# Patient Record
Sex: Male | Born: 1950 | Race: Black or African American | Hispanic: No | Marital: Single | Smoking: Light tobacco smoker
Health system: Southern US, Community
[De-identification: ages and names within clinical notes are randomized; demographics above are authoritative.]

## PROBLEM LIST (undated history)

## (undated) DIAGNOSIS — I10 Essential (primary) hypertension: Secondary | ICD-10-CM

## (undated) DIAGNOSIS — E785 Hyperlipidemia, unspecified: Secondary | ICD-10-CM

## (undated) DIAGNOSIS — K7460 Unspecified cirrhosis of liver: Secondary | ICD-10-CM

## (undated) DIAGNOSIS — N189 Chronic kidney disease, unspecified: Secondary | ICD-10-CM

## (undated) DIAGNOSIS — E781 Pure hyperglyceridemia: Secondary | ICD-10-CM

## (undated) DIAGNOSIS — K5730 Diverticulosis of large intestine without perforation or abscess without bleeding: Secondary | ICD-10-CM

## (undated) DIAGNOSIS — G43909 Migraine, unspecified, not intractable, without status migrainosus: Secondary | ICD-10-CM

## (undated) DIAGNOSIS — F319 Bipolar disorder, unspecified: Secondary | ICD-10-CM

## (undated) DIAGNOSIS — R911 Solitary pulmonary nodule: Secondary | ICD-10-CM

## (undated) DIAGNOSIS — K859 Acute pancreatitis without necrosis or infection, unspecified: Secondary | ICD-10-CM

## (undated) DIAGNOSIS — N529 Male erectile dysfunction, unspecified: Secondary | ICD-10-CM

## (undated) DIAGNOSIS — M6740 Ganglion, unspecified site: Secondary | ICD-10-CM

## (undated) DIAGNOSIS — K2270 Barrett's esophagus without dysplasia: Secondary | ICD-10-CM

## (undated) HISTORY — DX: Solitary pulmonary nodule: R91.1

## (undated) HISTORY — DX: Migraine, unspecified, not intractable, without status migrainosus: G43.909

## (undated) HISTORY — DX: Chronic kidney disease, unspecified: N18.9

## (undated) HISTORY — DX: Diverticulosis of large intestine without perforation or abscess without bleeding: K57.30

## (undated) HISTORY — DX: Acute pancreatitis without necrosis or infection, unspecified: K85.90

## (undated) HISTORY — DX: Hyperlipidemia, unspecified: E78.5

## (undated) HISTORY — DX: Ganglion, unspecified site: M67.40

## (undated) HISTORY — DX: Unspecified cirrhosis of liver: K74.60

## (undated) HISTORY — DX: Bipolar disorder, unspecified: F31.9

## (undated) HISTORY — DX: Pure hyperglyceridemia: E78.1

## (undated) HISTORY — DX: Male erectile dysfunction, unspecified: N52.9

## (undated) HISTORY — DX: Essential (primary) hypertension: I10

## (undated) HISTORY — DX: Barrett's esophagus without dysplasia: K22.70

---
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
## 1999-10-29 ENCOUNTER — Encounter: Admission: RE | Admit: 1999-10-29 | Discharge: 1999-10-29 | Payer: Self-pay | Admitting: Internal Medicine

## 1999-10-29 ENCOUNTER — Ambulatory Visit (HOSPITAL_COMMUNITY): Admission: RE | Admit: 1999-10-29 | Discharge: 1999-10-29 | Payer: Self-pay | Admitting: Internal Medicine

## 1999-11-10 ENCOUNTER — Emergency Department (HOSPITAL_COMMUNITY): Admission: EM | Admit: 1999-11-10 | Discharge: 1999-11-10 | Payer: Self-pay | Admitting: Emergency Medicine

## 1999-11-19 ENCOUNTER — Ambulatory Visit (HOSPITAL_COMMUNITY): Admission: RE | Admit: 1999-11-19 | Discharge: 1999-11-19 | Payer: Self-pay | Admitting: *Deleted

## 1999-11-19 ENCOUNTER — Encounter: Payer: Self-pay | Admitting: *Deleted

## 1999-12-03 ENCOUNTER — Encounter: Payer: Self-pay | Admitting: Emergency Medicine

## 1999-12-03 ENCOUNTER — Emergency Department (HOSPITAL_COMMUNITY): Admission: EM | Admit: 1999-12-03 | Discharge: 1999-12-03 | Payer: Self-pay | Admitting: Emergency Medicine

## 1999-12-05 ENCOUNTER — Emergency Department (HOSPITAL_COMMUNITY): Admission: EM | Admit: 1999-12-05 | Discharge: 1999-12-05 | Payer: Self-pay | Admitting: *Deleted

## 2000-01-18 ENCOUNTER — Inpatient Hospital Stay (HOSPITAL_COMMUNITY): Admission: EM | Admit: 2000-01-18 | Discharge: 2000-01-19 | Payer: Self-pay

## 2000-03-19 ENCOUNTER — Ambulatory Visit (HOSPITAL_COMMUNITY): Admission: RE | Admit: 2000-03-19 | Discharge: 2000-03-19 | Payer: Self-pay | Admitting: Family Medicine

## 2000-03-19 ENCOUNTER — Encounter: Payer: Self-pay | Admitting: Family Medicine

## 2000-08-06 ENCOUNTER — Ambulatory Visit (HOSPITAL_COMMUNITY): Admission: RE | Admit: 2000-08-06 | Discharge: 2000-08-06 | Payer: Self-pay | Admitting: Family Medicine

## 2000-09-02 ENCOUNTER — Emergency Department (HOSPITAL_COMMUNITY): Admission: EM | Admit: 2000-09-02 | Discharge: 2000-09-03 | Payer: Self-pay | Admitting: Emergency Medicine

## 2000-11-07 ENCOUNTER — Encounter: Payer: Self-pay | Admitting: Emergency Medicine

## 2000-11-07 ENCOUNTER — Emergency Department (HOSPITAL_COMMUNITY): Admission: EM | Admit: 2000-11-07 | Discharge: 2000-11-07 | Payer: Self-pay | Admitting: Emergency Medicine

## 2000-11-19 ENCOUNTER — Encounter: Admission: RE | Admit: 2000-11-19 | Discharge: 2000-11-26 | Payer: Self-pay | Admitting: Orthopedic Surgery

## 2000-12-24 ENCOUNTER — Emergency Department (HOSPITAL_COMMUNITY): Admission: EM | Admit: 2000-12-24 | Discharge: 2000-12-24 | Payer: Self-pay | Admitting: Emergency Medicine

## 2001-09-08 ENCOUNTER — Encounter (INDEPENDENT_AMBULATORY_CARE_PROVIDER_SITE_OTHER): Payer: Self-pay | Admitting: Specialist

## 2001-09-08 ENCOUNTER — Ambulatory Visit (HOSPITAL_BASED_OUTPATIENT_CLINIC_OR_DEPARTMENT_OTHER): Admission: RE | Admit: 2001-09-08 | Discharge: 2001-09-08 | Payer: Self-pay | Admitting: Orthopedic Surgery

## 2001-09-18 ENCOUNTER — Emergency Department (HOSPITAL_COMMUNITY): Admission: EM | Admit: 2001-09-18 | Discharge: 2001-09-18 | Payer: Self-pay | Admitting: Emergency Medicine

## 2002-01-13 ENCOUNTER — Encounter: Payer: Self-pay | Admitting: Gastroenterology

## 2002-01-13 ENCOUNTER — Encounter: Admission: RE | Admit: 2002-01-13 | Discharge: 2002-01-13 | Payer: Self-pay | Admitting: Gastroenterology

## 2002-02-10 ENCOUNTER — Encounter: Payer: Self-pay | Admitting: Emergency Medicine

## 2002-02-10 ENCOUNTER — Emergency Department (HOSPITAL_COMMUNITY): Admission: EM | Admit: 2002-02-10 | Discharge: 2002-02-10 | Payer: Self-pay | Admitting: Emergency Medicine

## 2002-03-08 ENCOUNTER — Emergency Department (HOSPITAL_COMMUNITY): Admission: EM | Admit: 2002-03-08 | Discharge: 2002-03-08 | Payer: Self-pay | Admitting: Emergency Medicine

## 2002-03-14 ENCOUNTER — Emergency Department (HOSPITAL_COMMUNITY): Admission: EM | Admit: 2002-03-14 | Discharge: 2002-03-14 | Payer: Self-pay | Admitting: *Deleted

## 2002-03-14 ENCOUNTER — Emergency Department (HOSPITAL_COMMUNITY): Admission: EM | Admit: 2002-03-14 | Discharge: 2002-03-14 | Payer: Self-pay | Admitting: Emergency Medicine

## 2002-03-14 ENCOUNTER — Encounter: Payer: Self-pay | Admitting: Emergency Medicine

## 2002-03-15 ENCOUNTER — Encounter: Payer: Self-pay | Admitting: Emergency Medicine

## 2002-03-16 ENCOUNTER — Inpatient Hospital Stay (HOSPITAL_COMMUNITY): Admission: EM | Admit: 2002-03-16 | Discharge: 2002-03-18 | Payer: Self-pay | Admitting: Emergency Medicine

## 2002-03-17 ENCOUNTER — Encounter: Payer: Self-pay | Admitting: Internal Medicine

## 2002-06-24 ENCOUNTER — Emergency Department (HOSPITAL_COMMUNITY): Admission: EM | Admit: 2002-06-24 | Discharge: 2002-06-24 | Payer: Self-pay | Admitting: Emergency Medicine

## 2002-07-06 ENCOUNTER — Emergency Department (HOSPITAL_COMMUNITY): Admission: EM | Admit: 2002-07-06 | Discharge: 2002-07-06 | Payer: Self-pay | Admitting: Emergency Medicine

## 2002-07-13 ENCOUNTER — Encounter: Payer: Self-pay | Admitting: Emergency Medicine

## 2002-07-13 ENCOUNTER — Emergency Department (HOSPITAL_COMMUNITY): Admission: EM | Admit: 2002-07-13 | Discharge: 2002-07-13 | Payer: Self-pay | Admitting: Emergency Medicine

## 2002-07-21 ENCOUNTER — Emergency Department (HOSPITAL_COMMUNITY): Admission: EM | Admit: 2002-07-21 | Discharge: 2002-07-21 | Payer: Self-pay | Admitting: Emergency Medicine

## 2002-07-21 ENCOUNTER — Emergency Department (HOSPITAL_COMMUNITY): Admission: EM | Admit: 2002-07-21 | Discharge: 2002-07-22 | Payer: Self-pay | Admitting: *Deleted

## 2002-07-23 ENCOUNTER — Emergency Department (HOSPITAL_COMMUNITY): Admission: EM | Admit: 2002-07-23 | Discharge: 2002-07-23 | Payer: Self-pay | Admitting: Emergency Medicine

## 2002-08-03 ENCOUNTER — Emergency Department (HOSPITAL_COMMUNITY): Admission: EM | Admit: 2002-08-03 | Discharge: 2002-08-03 | Payer: Self-pay | Admitting: Emergency Medicine

## 2002-08-09 ENCOUNTER — Emergency Department (HOSPITAL_COMMUNITY): Admission: EM | Admit: 2002-08-09 | Discharge: 2002-08-09 | Payer: Self-pay | Admitting: Emergency Medicine

## 2003-08-15 ENCOUNTER — Ambulatory Visit (HOSPITAL_COMMUNITY): Admission: RE | Admit: 2003-08-15 | Discharge: 2003-08-15 | Payer: Self-pay | Admitting: Gastroenterology

## 2003-09-30 HISTORY — PX: CARDIAC CATHETERIZATION: SHX172

## 2004-01-24 ENCOUNTER — Emergency Department (HOSPITAL_COMMUNITY): Admission: EM | Admit: 2004-01-24 | Discharge: 2004-01-24 | Payer: Self-pay | Admitting: Family Medicine

## 2004-03-07 ENCOUNTER — Ambulatory Visit (HOSPITAL_COMMUNITY): Admission: RE | Admit: 2004-03-07 | Discharge: 2004-03-07 | Payer: Self-pay | Admitting: Orthopedic Surgery

## 2004-03-23 ENCOUNTER — Emergency Department (HOSPITAL_COMMUNITY): Admission: EM | Admit: 2004-03-23 | Discharge: 2004-03-23 | Payer: Self-pay | Admitting: Family Medicine

## 2004-04-05 ENCOUNTER — Emergency Department (HOSPITAL_COMMUNITY): Admission: EM | Admit: 2004-04-05 | Discharge: 2004-04-05 | Payer: Self-pay | Admitting: Family Medicine

## 2004-05-01 ENCOUNTER — Encounter: Admission: RE | Admit: 2004-05-01 | Discharge: 2004-05-01 | Payer: Self-pay | Admitting: Family Medicine

## 2004-06-11 ENCOUNTER — Ambulatory Visit: Payer: Self-pay | Admitting: Family Medicine

## 2004-06-13 ENCOUNTER — Ambulatory Visit: Payer: Self-pay | Admitting: Family Medicine

## 2004-07-02 ENCOUNTER — Observation Stay (HOSPITAL_COMMUNITY): Admission: EM | Admit: 2004-07-02 | Discharge: 2004-07-03 | Payer: Self-pay | Admitting: Emergency Medicine

## 2004-07-05 ENCOUNTER — Ambulatory Visit: Payer: Self-pay | Admitting: Family Medicine

## 2004-07-08 ENCOUNTER — Emergency Department (HOSPITAL_COMMUNITY): Admission: EM | Admit: 2004-07-08 | Discharge: 2004-07-08 | Payer: Self-pay | Admitting: Family Medicine

## 2004-07-22 ENCOUNTER — Ambulatory Visit: Payer: Self-pay | Admitting: Family Medicine

## 2004-08-02 ENCOUNTER — Ambulatory Visit: Payer: Self-pay | Admitting: Gastroenterology

## 2004-08-09 ENCOUNTER — Ambulatory Visit: Payer: Self-pay | Admitting: Sports Medicine

## 2004-09-10 ENCOUNTER — Ambulatory Visit: Payer: Self-pay

## 2004-10-09 ENCOUNTER — Emergency Department (HOSPITAL_COMMUNITY): Admission: EM | Admit: 2004-10-09 | Discharge: 2004-10-09 | Payer: Self-pay | Admitting: Family Medicine

## 2004-10-16 ENCOUNTER — Ambulatory Visit: Payer: Self-pay | Admitting: Family Medicine

## 2004-10-18 ENCOUNTER — Encounter: Admission: RE | Admit: 2004-10-18 | Discharge: 2004-10-18 | Payer: Self-pay | Admitting: Sports Medicine

## 2004-11-06 ENCOUNTER — Ambulatory Visit (HOSPITAL_COMMUNITY): Admission: RE | Admit: 2004-11-06 | Discharge: 2004-11-06 | Payer: Self-pay | Admitting: Family Medicine

## 2004-11-06 ENCOUNTER — Ambulatory Visit: Payer: Self-pay | Admitting: Family Medicine

## 2004-11-29 ENCOUNTER — Ambulatory Visit: Payer: Self-pay | Admitting: Family Medicine

## 2005-01-13 ENCOUNTER — Ambulatory Visit: Payer: Self-pay | Admitting: Family Medicine

## 2005-02-13 ENCOUNTER — Ambulatory Visit: Payer: Self-pay | Admitting: Family Medicine

## 2005-02-20 ENCOUNTER — Encounter: Admission: RE | Admit: 2005-02-20 | Discharge: 2005-02-20 | Payer: Self-pay | Admitting: Sports Medicine

## 2005-03-20 ENCOUNTER — Ambulatory Visit: Payer: Self-pay | Admitting: Family Medicine

## 2005-03-28 ENCOUNTER — Emergency Department (HOSPITAL_COMMUNITY): Admission: EM | Admit: 2005-03-28 | Discharge: 2005-03-28 | Payer: Self-pay | Admitting: Emergency Medicine

## 2005-04-18 ENCOUNTER — Ambulatory Visit: Payer: Self-pay | Admitting: Family Medicine

## 2005-04-30 ENCOUNTER — Ambulatory Visit: Payer: Self-pay | Admitting: Family Medicine

## 2005-05-14 ENCOUNTER — Ambulatory Visit (HOSPITAL_COMMUNITY): Admission: RE | Admit: 2005-05-14 | Discharge: 2005-05-14 | Payer: Self-pay | Admitting: *Deleted

## 2005-05-14 ENCOUNTER — Encounter (INDEPENDENT_AMBULATORY_CARE_PROVIDER_SITE_OTHER): Payer: Self-pay | Admitting: Specialist

## 2005-06-25 ENCOUNTER — Ambulatory Visit: Payer: Self-pay | Admitting: Family Medicine

## 2005-07-09 ENCOUNTER — Ambulatory Visit: Payer: Self-pay | Admitting: Family Medicine

## 2005-07-14 ENCOUNTER — Emergency Department (HOSPITAL_COMMUNITY): Admission: EM | Admit: 2005-07-14 | Discharge: 2005-07-14 | Payer: Self-pay | Admitting: Emergency Medicine

## 2005-07-16 ENCOUNTER — Ambulatory Visit: Payer: Self-pay | Admitting: Family Medicine

## 2005-07-21 ENCOUNTER — Ambulatory Visit: Payer: Self-pay | Admitting: Family Medicine

## 2005-09-03 ENCOUNTER — Ambulatory Visit: Payer: Self-pay | Admitting: Family Medicine

## 2005-10-08 ENCOUNTER — Encounter: Admission: RE | Admit: 2005-10-08 | Discharge: 2005-10-08 | Payer: Self-pay | Admitting: Orthopedic Surgery

## 2005-10-18 ENCOUNTER — Emergency Department (HOSPITAL_COMMUNITY): Admission: EM | Admit: 2005-10-18 | Discharge: 2005-10-18 | Payer: Self-pay | Admitting: Family Medicine

## 2005-11-26 ENCOUNTER — Ambulatory Visit: Payer: Self-pay | Admitting: Family Medicine

## 2005-12-02 ENCOUNTER — Ambulatory Visit: Payer: Self-pay | Admitting: Family Medicine

## 2005-12-04 ENCOUNTER — Encounter: Admission: RE | Admit: 2005-12-04 | Discharge: 2005-12-04 | Payer: Self-pay | Admitting: Sports Medicine

## 2005-12-19 ENCOUNTER — Ambulatory Visit: Payer: Self-pay | Admitting: Family Medicine

## 2006-01-09 ENCOUNTER — Ambulatory Visit: Payer: Self-pay | Admitting: Sports Medicine

## 2006-02-06 ENCOUNTER — Ambulatory Visit: Payer: Self-pay | Admitting: Family Medicine

## 2006-02-20 ENCOUNTER — Ambulatory Visit: Payer: Self-pay | Admitting: Family Medicine

## 2006-02-25 ENCOUNTER — Encounter: Admission: RE | Admit: 2006-02-25 | Discharge: 2006-03-10 | Payer: Self-pay | Admitting: Family Medicine

## 2006-03-06 ENCOUNTER — Encounter: Payer: Self-pay | Admitting: Vascular Surgery

## 2006-03-06 ENCOUNTER — Ambulatory Visit (HOSPITAL_COMMUNITY): Admission: RE | Admit: 2006-03-06 | Discharge: 2006-03-06 | Payer: Self-pay | Admitting: Family Medicine

## 2006-03-13 ENCOUNTER — Ambulatory Visit: Payer: Self-pay | Admitting: Family Medicine

## 2006-04-18 ENCOUNTER — Emergency Department (HOSPITAL_COMMUNITY): Admission: EM | Admit: 2006-04-18 | Discharge: 2006-04-18 | Payer: Self-pay | Admitting: Emergency Medicine

## 2006-04-29 ENCOUNTER — Ambulatory Visit: Payer: Self-pay | Admitting: Family Medicine

## 2006-06-03 ENCOUNTER — Ambulatory Visit: Payer: Self-pay | Admitting: Family Medicine

## 2006-07-01 ENCOUNTER — Ambulatory Visit: Payer: Self-pay | Admitting: Family Medicine

## 2006-07-20 ENCOUNTER — Ambulatory Visit: Payer: Self-pay | Admitting: Family Medicine

## 2006-08-17 ENCOUNTER — Ambulatory Visit: Payer: Self-pay | Admitting: Sports Medicine

## 2006-09-01 ENCOUNTER — Ambulatory Visit: Payer: Self-pay | Admitting: Family Medicine

## 2006-09-09 ENCOUNTER — Ambulatory Visit: Payer: Self-pay | Admitting: Sports Medicine

## 2006-09-25 ENCOUNTER — Ambulatory Visit: Payer: Self-pay | Admitting: Family Medicine

## 2006-11-25 ENCOUNTER — Emergency Department (HOSPITAL_COMMUNITY): Admission: EM | Admit: 2006-11-25 | Discharge: 2006-11-25 | Payer: Self-pay | Admitting: Family Medicine

## 2006-11-26 DIAGNOSIS — F319 Bipolar disorder, unspecified: Secondary | ICD-10-CM | POA: Insufficient documentation

## 2006-11-26 DIAGNOSIS — K7030 Alcoholic cirrhosis of liver without ascites: Secondary | ICD-10-CM | POA: Insufficient documentation

## 2006-11-26 DIAGNOSIS — K649 Unspecified hemorrhoids: Secondary | ICD-10-CM | POA: Insufficient documentation

## 2006-11-26 DIAGNOSIS — K5730 Diverticulosis of large intestine without perforation or abscess without bleeding: Secondary | ICD-10-CM | POA: Insufficient documentation

## 2006-11-26 DIAGNOSIS — K279 Peptic ulcer, site unspecified, unspecified as acute or chronic, without hemorrhage or perforation: Secondary | ICD-10-CM | POA: Insufficient documentation

## 2006-11-26 DIAGNOSIS — G4700 Insomnia, unspecified: Secondary | ICD-10-CM | POA: Insufficient documentation

## 2006-11-26 DIAGNOSIS — E785 Hyperlipidemia, unspecified: Secondary | ICD-10-CM | POA: Insufficient documentation

## 2006-11-26 DIAGNOSIS — I10 Essential (primary) hypertension: Secondary | ICD-10-CM | POA: Insufficient documentation

## 2006-11-26 DIAGNOSIS — N529 Male erectile dysfunction, unspecified: Secondary | ICD-10-CM | POA: Insufficient documentation

## 2006-11-26 DIAGNOSIS — G43909 Migraine, unspecified, not intractable, without status migrainosus: Secondary | ICD-10-CM | POA: Insufficient documentation

## 2006-11-26 DIAGNOSIS — K861 Other chronic pancreatitis: Secondary | ICD-10-CM | POA: Insufficient documentation

## 2006-12-08 ENCOUNTER — Ambulatory Visit: Payer: Self-pay | Admitting: Family Medicine

## 2006-12-15 ENCOUNTER — Ambulatory Visit: Payer: Self-pay | Admitting: Family Medicine

## 2006-12-15 ENCOUNTER — Encounter (INDEPENDENT_AMBULATORY_CARE_PROVIDER_SITE_OTHER): Payer: Self-pay | Admitting: Family Medicine

## 2006-12-15 LAB — CONVERTED CEMR LAB
ALT: 19 units/L (ref 0–53)
AST: 16 units/L (ref 0–37)
Albumin: 4.5 g/dL (ref 3.5–5.2)
Alkaline Phosphatase: 60 units/L (ref 39–117)
BUN: 13 mg/dL (ref 6–23)
CO2: 20 meq/L (ref 19–32)
Calcium: 9.4 mg/dL (ref 8.4–10.5)
Chloride: 112 meq/L (ref 96–112)
Cholesterol: 111 mg/dL (ref 0–200)
Creatinine, Ser: 1.3 mg/dL (ref 0.40–1.50)
Glucose, Bld: 92 mg/dL (ref 70–99)
HDL: 32 mg/dL — ABNORMAL LOW (ref >39)
LDL Cholesterol: 44 mg/dL (ref 0–99)
Potassium: 4 meq/L (ref 3.5–5.3)
Sodium: 142 meq/L (ref 135–145)
Total Bilirubin: 0.5 mg/dL (ref 0.3–1.2)
Total CHOL/HDL Ratio: 3.5
Total Protein: 7.1 g/dL (ref 6.0–8.3)
Triglycerides: 173 mg/dL — ABNORMAL HIGH (ref <150)
VLDL: 35 mg/dL (ref 0–40)

## 2007-01-15 ENCOUNTER — Ambulatory Visit: Payer: Self-pay | Admitting: Family Medicine

## 2007-03-11 ENCOUNTER — Ambulatory Visit: Payer: Self-pay | Admitting: Family Medicine

## 2007-03-11 DIAGNOSIS — F17200 Nicotine dependence, unspecified, uncomplicated: Secondary | ICD-10-CM | POA: Insufficient documentation

## 2007-03-16 ENCOUNTER — Encounter (INDEPENDENT_AMBULATORY_CARE_PROVIDER_SITE_OTHER): Payer: Self-pay | Admitting: *Deleted

## 2007-04-22 ENCOUNTER — Telehealth (INDEPENDENT_AMBULATORY_CARE_PROVIDER_SITE_OTHER): Payer: Self-pay | Admitting: Family Medicine

## 2007-05-31 ENCOUNTER — Emergency Department (HOSPITAL_COMMUNITY): Admission: EM | Admit: 2007-05-31 | Discharge: 2007-05-31 | Payer: Self-pay | Admitting: Family Medicine

## 2007-06-21 ENCOUNTER — Telehealth (INDEPENDENT_AMBULATORY_CARE_PROVIDER_SITE_OTHER): Payer: Self-pay | Admitting: Family Medicine

## 2007-06-22 ENCOUNTER — Telehealth: Payer: Self-pay | Admitting: *Deleted

## 2007-07-08 ENCOUNTER — Emergency Department (HOSPITAL_COMMUNITY): Admission: EM | Admit: 2007-07-08 | Discharge: 2007-07-08 | Payer: Self-pay | Admitting: Emergency Medicine

## 2007-09-02 ENCOUNTER — Ambulatory Visit: Payer: Self-pay | Admitting: Family Medicine

## 2007-11-09 ENCOUNTER — Ambulatory Visit (HOSPITAL_COMMUNITY): Admission: RE | Admit: 2007-11-09 | Discharge: 2007-11-09 | Payer: Self-pay | Admitting: Family Medicine

## 2007-11-09 ENCOUNTER — Ambulatory Visit: Payer: Self-pay | Admitting: Family Medicine

## 2007-11-11 ENCOUNTER — Encounter (INDEPENDENT_AMBULATORY_CARE_PROVIDER_SITE_OTHER): Payer: Self-pay | Admitting: Family Medicine

## 2007-11-15 ENCOUNTER — Encounter (INDEPENDENT_AMBULATORY_CARE_PROVIDER_SITE_OTHER): Payer: Self-pay | Admitting: Family Medicine

## 2007-11-17 ENCOUNTER — Encounter (INDEPENDENT_AMBULATORY_CARE_PROVIDER_SITE_OTHER): Payer: Self-pay | Admitting: Family Medicine

## 2007-11-19 ENCOUNTER — Ambulatory Visit: Payer: Self-pay | Admitting: Family Medicine

## 2007-12-20 ENCOUNTER — Encounter (INDEPENDENT_AMBULATORY_CARE_PROVIDER_SITE_OTHER): Payer: Self-pay | Admitting: Family Medicine

## 2007-12-20 ENCOUNTER — Ambulatory Visit: Payer: Self-pay | Admitting: Family Medicine

## 2007-12-20 LAB — CONVERTED CEMR LAB
ALT: 12 units/L (ref 0–53)
AST: 16 units/L (ref 0–37)
Albumin: 4.7 g/dL (ref 3.5–5.2)
Alkaline Phosphatase: 59 units/L (ref 39–117)
BUN: 10 mg/dL (ref 6–23)
CO2: 24 meq/L (ref 19–32)
Calcium: 9.4 mg/dL (ref 8.4–10.5)
Chloride: 102 meq/L (ref 96–112)
Cholesterol: 101 mg/dL (ref 0–200)
Creatinine, Ser: 1.31 mg/dL (ref 0.40–1.50)
Glucose, Bld: 86 mg/dL (ref 70–99)
HDL: 38 mg/dL — ABNORMAL LOW (ref >39)
LDL Cholesterol: 48 mg/dL (ref 0–99)
Potassium: 3.8 meq/L (ref 3.5–5.3)
Sodium: 137 meq/L (ref 135–145)
Total Bilirubin: 0.5 mg/dL (ref 0.3–1.2)
Total CHOL/HDL Ratio: 2.7
Total Protein: 7.1 g/dL (ref 6.0–8.3)
Triglycerides: 77 mg/dL (ref <150)
VLDL: 15 mg/dL (ref 0–40)

## 2007-12-28 ENCOUNTER — Telehealth (INDEPENDENT_AMBULATORY_CARE_PROVIDER_SITE_OTHER): Payer: Self-pay | Admitting: Family Medicine

## 2008-03-16 ENCOUNTER — Ambulatory Visit: Payer: Self-pay | Admitting: Family Medicine

## 2008-03-16 LAB — CONVERTED CEMR LAB
Bilirubin Urine: NEGATIVE
Blood in Urine, dipstick: NEGATIVE
Glucose, Urine, Semiquant: NEGATIVE
Ketones, urine, test strip: NEGATIVE
Nitrite: NEGATIVE
Protein, U semiquant: NEGATIVE
Specific Gravity, Urine: 1.02
Urobilinogen, UA: 0.2
WBC Urine, dipstick: NEGATIVE
pH: 5.5

## 2008-05-02 ENCOUNTER — Encounter (INDEPENDENT_AMBULATORY_CARE_PROVIDER_SITE_OTHER): Payer: Self-pay | Admitting: Family Medicine

## 2008-06-12 ENCOUNTER — Encounter (INDEPENDENT_AMBULATORY_CARE_PROVIDER_SITE_OTHER): Payer: Self-pay | Admitting: Family Medicine

## 2008-06-12 DIAGNOSIS — K2270 Barrett's esophagus without dysplasia: Secondary | ICD-10-CM | POA: Insufficient documentation

## 2008-06-15 ENCOUNTER — Encounter (INDEPENDENT_AMBULATORY_CARE_PROVIDER_SITE_OTHER): Payer: Self-pay | Admitting: Family Medicine

## 2008-06-27 ENCOUNTER — Encounter (INDEPENDENT_AMBULATORY_CARE_PROVIDER_SITE_OTHER): Payer: Self-pay | Admitting: Family Medicine

## 2008-06-27 ENCOUNTER — Ambulatory Visit: Payer: Self-pay | Admitting: Family Medicine

## 2008-06-27 LAB — CONVERTED CEMR LAB: PSA: 0.43 ng/mL (ref 0.10–4.00)

## 2008-07-19 ENCOUNTER — Encounter: Payer: Self-pay | Admitting: *Deleted

## 2008-08-02 ENCOUNTER — Ambulatory Visit: Payer: Self-pay | Admitting: Family Medicine

## 2008-08-02 ENCOUNTER — Telehealth (INDEPENDENT_AMBULATORY_CARE_PROVIDER_SITE_OTHER): Payer: Self-pay | Admitting: *Deleted

## 2008-08-23 ENCOUNTER — Ambulatory Visit: Payer: Self-pay | Admitting: Family Medicine

## 2008-10-16 ENCOUNTER — Telehealth: Payer: Self-pay | Admitting: *Deleted

## 2008-10-16 ENCOUNTER — Ambulatory Visit: Payer: Self-pay | Admitting: Family Medicine

## 2008-10-31 ENCOUNTER — Ambulatory Visit: Payer: Self-pay | Admitting: Family Medicine

## 2008-11-06 ENCOUNTER — Ambulatory Visit: Payer: Self-pay | Admitting: Family Medicine

## 2008-11-17 ENCOUNTER — Ambulatory Visit: Payer: Self-pay | Admitting: Family Medicine

## 2008-11-21 ENCOUNTER — Emergency Department (HOSPITAL_COMMUNITY): Admission: EM | Admit: 2008-11-21 | Discharge: 2008-11-21 | Payer: Self-pay | Admitting: Family Medicine

## 2008-11-24 ENCOUNTER — Emergency Department (HOSPITAL_COMMUNITY): Admission: EM | Admit: 2008-11-24 | Discharge: 2008-11-24 | Payer: Self-pay | Admitting: Family Medicine

## 2008-12-01 ENCOUNTER — Ambulatory Visit: Payer: Self-pay | Admitting: Family Medicine

## 2008-12-22 ENCOUNTER — Ambulatory Visit: Payer: Self-pay | Admitting: Family Medicine

## 2009-01-12 ENCOUNTER — Encounter: Payer: Self-pay | Admitting: *Deleted

## 2009-01-15 ENCOUNTER — Telehealth: Payer: Self-pay | Admitting: *Deleted

## 2009-01-16 ENCOUNTER — Telehealth (INDEPENDENT_AMBULATORY_CARE_PROVIDER_SITE_OTHER): Payer: Self-pay | Admitting: Family Medicine

## 2009-01-19 ENCOUNTER — Ambulatory Visit: Payer: Self-pay | Admitting: Family Medicine

## 2009-01-22 ENCOUNTER — Encounter (INDEPENDENT_AMBULATORY_CARE_PROVIDER_SITE_OTHER): Payer: Self-pay | Admitting: *Deleted

## 2009-01-23 ENCOUNTER — Encounter (INDEPENDENT_AMBULATORY_CARE_PROVIDER_SITE_OTHER): Payer: Self-pay | Admitting: Family Medicine

## 2009-01-23 ENCOUNTER — Encounter (INDEPENDENT_AMBULATORY_CARE_PROVIDER_SITE_OTHER): Payer: Self-pay | Admitting: *Deleted

## 2009-01-23 ENCOUNTER — Encounter: Payer: Self-pay | Admitting: *Deleted

## 2009-01-23 ENCOUNTER — Ambulatory Visit: Payer: Self-pay | Admitting: Family Medicine

## 2009-01-23 LAB — CONVERTED CEMR LAB
ALT: 9 units/L (ref 0–53)
AST: 15 units/L (ref 0–37)
Albumin: 4.5 g/dL (ref 3.5–5.2)
Alkaline Phosphatase: 64 units/L (ref 39–117)
BUN: 17 mg/dL (ref 6–23)
CO2: 19 meq/L (ref 19–32)
Calcium: 9.6 mg/dL (ref 8.4–10.5)
Chloride: 109 meq/L (ref 96–112)
Cholesterol: 115 mg/dL (ref 0–200)
Creatinine, Ser: 1.43 mg/dL (ref 0.40–1.50)
Glucose, Bld: 92 mg/dL (ref 70–99)
HDL: 28 mg/dL — ABNORMAL LOW (ref >39)
LDL Cholesterol: 46 mg/dL (ref 0–99)
Potassium: 4 meq/L (ref 3.5–5.3)
Sodium: 139 meq/L (ref 135–145)
Total Bilirubin: 0.4 mg/dL (ref 0.3–1.2)
Total CHOL/HDL Ratio: 4.1
Total Protein: 7.1 g/dL (ref 6.0–8.3)
Triglycerides: 204 mg/dL — ABNORMAL HIGH (ref <150)
VLDL: 41 mg/dL — ABNORMAL HIGH (ref 0–40)

## 2009-01-24 ENCOUNTER — Telehealth (INDEPENDENT_AMBULATORY_CARE_PROVIDER_SITE_OTHER): Payer: Self-pay | Admitting: *Deleted

## 2009-01-24 ENCOUNTER — Encounter (INDEPENDENT_AMBULATORY_CARE_PROVIDER_SITE_OTHER): Payer: Self-pay | Admitting: Family Medicine

## 2009-01-25 ENCOUNTER — Encounter (INDEPENDENT_AMBULATORY_CARE_PROVIDER_SITE_OTHER): Payer: Self-pay | Admitting: Family Medicine

## 2009-02-06 ENCOUNTER — Encounter (INDEPENDENT_AMBULATORY_CARE_PROVIDER_SITE_OTHER): Payer: Self-pay | Admitting: Family Medicine

## 2009-02-15 ENCOUNTER — Encounter (INDEPENDENT_AMBULATORY_CARE_PROVIDER_SITE_OTHER): Payer: Self-pay | Admitting: Family Medicine

## 2009-03-16 ENCOUNTER — Ambulatory Visit: Payer: Self-pay | Admitting: Family Medicine

## 2009-04-12 ENCOUNTER — Encounter: Payer: Self-pay | Admitting: Family Medicine

## 2009-04-12 ENCOUNTER — Ambulatory Visit: Payer: Self-pay | Admitting: Family Medicine

## 2009-05-17 ENCOUNTER — Ambulatory Visit: Payer: Self-pay | Admitting: Family Medicine

## 2009-06-19 ENCOUNTER — Encounter: Payer: Self-pay | Admitting: Family Medicine

## 2009-06-22 ENCOUNTER — Encounter: Payer: Self-pay | Admitting: Family Medicine

## 2009-06-29 ENCOUNTER — Ambulatory Visit: Payer: Self-pay | Admitting: Family Medicine

## 2009-06-29 DIAGNOSIS — B009 Herpesviral infection, unspecified: Secondary | ICD-10-CM | POA: Insufficient documentation

## 2009-07-19 ENCOUNTER — Ambulatory Visit: Payer: Self-pay | Admitting: Family Medicine

## 2009-08-21 ENCOUNTER — Encounter: Payer: Self-pay | Admitting: Family Medicine

## 2009-08-21 ENCOUNTER — Ambulatory Visit: Payer: Self-pay | Admitting: Family Medicine

## 2009-08-21 ENCOUNTER — Telehealth: Payer: Self-pay | Admitting: Family Medicine

## 2009-08-21 LAB — CONVERTED CEMR LAB
ALT: 10 units/L (ref 0–53)
AST: 14 units/L (ref 0–37)
Albumin: 4.4 g/dL (ref 3.5–5.2)
Alkaline Phosphatase: 61 units/L (ref 39–117)
BUN: 9 mg/dL (ref 6–23)
CO2: 18 meq/L — ABNORMAL LOW (ref 19–32)
Calcium: 8.7 mg/dL (ref 8.4–10.5)
Chloride: 104 meq/L (ref 96–112)
Creatinine, Ser: 1.32 mg/dL (ref 0.40–1.50)
Glucose, Bld: 116 mg/dL — ABNORMAL HIGH (ref 70–99)
PSA: 0.33 ng/mL (ref 0.10–4.00)
Potassium: 3.5 meq/L (ref 3.5–5.3)
Sodium: 139 meq/L (ref 135–145)
Total Bilirubin: 0.4 mg/dL (ref 0.3–1.2)
Total Protein: 6.7 g/dL (ref 6.0–8.3)

## 2009-08-28 ENCOUNTER — Ambulatory Visit: Payer: Self-pay | Admitting: Family Medicine

## 2009-08-28 ENCOUNTER — Telehealth: Payer: Self-pay | Admitting: Family Medicine

## 2009-09-01 ENCOUNTER — Emergency Department (HOSPITAL_COMMUNITY): Admission: EM | Admit: 2009-09-01 | Discharge: 2009-09-01 | Payer: Self-pay | Admitting: Emergency Medicine

## 2009-09-29 DIAGNOSIS — K2270 Barrett's esophagus without dysplasia: Secondary | ICD-10-CM

## 2009-09-29 HISTORY — DX: Barrett's esophagus without dysplasia: K22.70

## 2009-10-19 ENCOUNTER — Encounter: Admission: RE | Admit: 2009-10-19 | Discharge: 2009-10-19 | Payer: Self-pay | Admitting: Cardiology

## 2009-11-11 ENCOUNTER — Emergency Department (HOSPITAL_COMMUNITY): Admission: EM | Admit: 2009-11-11 | Discharge: 2009-11-11 | Payer: Self-pay | Admitting: Family Medicine

## 2009-12-27 ENCOUNTER — Telehealth: Payer: Self-pay | Admitting: Family Medicine

## 2010-01-01 ENCOUNTER — Encounter (INDEPENDENT_AMBULATORY_CARE_PROVIDER_SITE_OTHER): Payer: Self-pay | Admitting: *Deleted

## 2010-01-01 ENCOUNTER — Encounter: Payer: Self-pay | Admitting: Family Medicine

## 2010-01-11 ENCOUNTER — Encounter: Payer: Self-pay | Admitting: Family Medicine

## 2010-01-11 ENCOUNTER — Ambulatory Visit: Payer: Self-pay | Admitting: Family Medicine

## 2010-01-11 LAB — CONVERTED CEMR LAB
ALT: 13 units/L (ref 0–53)
AST: 16 units/L (ref 0–37)
Albumin: 4.6 g/dL (ref 3.5–5.2)
Alkaline Phosphatase: 73 units/L (ref 39–117)
BUN: 14 mg/dL (ref 6–23)
CO2: 22 meq/L (ref 19–32)
Calcium: 9.4 mg/dL (ref 8.4–10.5)
Chloride: 107 meq/L (ref 96–112)
Creatinine, Ser: 1.53 mg/dL — ABNORMAL HIGH (ref 0.40–1.50)
Direct LDL: 50 mg/dL
Glucose, Bld: 101 mg/dL — ABNORMAL HIGH (ref 70–99)
HCT: 37.4 % — ABNORMAL LOW (ref 39.0–52.0)
Hemoglobin: 12.9 g/dL — ABNORMAL LOW (ref 13.0–17.0)
Lipase: 34 units/L (ref 0–75)
MCHC: 34.5 g/dL (ref 30.0–36.0)
MCV: 89.7 fL (ref 78.0–100.0)
Platelets: 179 10*3/uL (ref 150–400)
Potassium: 3.6 meq/L (ref 3.5–5.3)
RBC: 4.17 M/uL — ABNORMAL LOW (ref 4.22–5.81)
RDW: 13.5 % (ref 11.5–15.5)
Sodium: 140 meq/L (ref 135–145)
Total Bilirubin: 0.3 mg/dL (ref 0.3–1.2)
Total Protein: 6.7 g/dL (ref 6.0–8.3)
WBC: 9.2 10*3/uL (ref 4.0–10.5)

## 2010-01-14 ENCOUNTER — Encounter: Payer: Self-pay | Admitting: Family Medicine

## 2010-01-22 ENCOUNTER — Encounter: Payer: Self-pay | Admitting: Family Medicine

## 2010-02-07 ENCOUNTER — Encounter: Admission: RE | Admit: 2010-02-07 | Discharge: 2010-02-07 | Payer: Self-pay | Admitting: Gastroenterology

## 2010-02-13 ENCOUNTER — Ambulatory Visit: Payer: Self-pay | Admitting: Family Medicine

## 2010-02-19 ENCOUNTER — Telehealth: Payer: Self-pay | Admitting: Family Medicine

## 2010-02-26 ENCOUNTER — Encounter: Payer: Self-pay | Admitting: Family Medicine

## 2010-03-13 ENCOUNTER — Ambulatory Visit: Payer: Self-pay | Admitting: Family Medicine

## 2010-03-13 ENCOUNTER — Encounter: Payer: Self-pay | Admitting: Family Medicine

## 2010-03-13 LAB — CONVERTED CEMR LAB
BUN: 14 mg/dL (ref 6–23)
CO2: 22 meq/L (ref 19–32)
Calcium: 9.1 mg/dL (ref 8.4–10.5)
Chloride: 105 meq/L (ref 96–112)
Cholesterol: 179 mg/dL (ref 0–200)
Creatinine, Ser: 1.38 mg/dL (ref 0.40–1.50)
Glucose, Bld: 93 mg/dL (ref 70–99)
HDL: 33 mg/dL — ABNORMAL LOW (ref >39)
LDL Cholesterol: 98 mg/dL (ref 0–99)
Potassium: 4 meq/L (ref 3.5–5.3)
Sodium: 137 meq/L (ref 135–145)
Total CHOL/HDL Ratio: 5.4
Triglycerides: 238 mg/dL — ABNORMAL HIGH (ref <150)
VLDL: 48 mg/dL — ABNORMAL HIGH (ref 0–40)

## 2010-03-14 ENCOUNTER — Encounter: Payer: Self-pay | Admitting: Family Medicine

## 2010-03-28 ENCOUNTER — Ambulatory Visit: Payer: Self-pay | Admitting: Family Medicine

## 2010-03-28 ENCOUNTER — Ambulatory Visit (HOSPITAL_COMMUNITY): Admission: RE | Admit: 2010-03-28 | Discharge: 2010-03-28 | Payer: Self-pay | Admitting: Family Medicine

## 2010-03-28 DIAGNOSIS — I2510 Atherosclerotic heart disease of native coronary artery without angina pectoris: Secondary | ICD-10-CM | POA: Insufficient documentation

## 2010-04-05 ENCOUNTER — Encounter: Payer: Self-pay | Admitting: Family Medicine

## 2010-06-26 ENCOUNTER — Ambulatory Visit: Payer: Self-pay | Admitting: Family Medicine

## 2010-06-26 DIAGNOSIS — M6740 Ganglion, unspecified site: Secondary | ICD-10-CM | POA: Insufficient documentation

## 2010-06-26 HISTORY — DX: Ganglion, unspecified site: M67.40

## 2010-08-27 ENCOUNTER — Encounter (INDEPENDENT_AMBULATORY_CARE_PROVIDER_SITE_OTHER): Payer: Self-pay | Admitting: *Deleted

## 2010-10-03 ENCOUNTER — Encounter: Payer: Self-pay | Admitting: Family Medicine

## 2010-10-20 ENCOUNTER — Encounter: Payer: Self-pay | Admitting: Orthopedic Surgery

## 2010-10-29 NOTE — Progress Notes (Signed)
Summary: phn msg   Phone Note Call from Patient Call back at [facility] Phone [phone]   Caller: Patient Summary of Call: pt rec'd message that he is to call back about something to do with knot on lung... not sure who called him Initial call taken by: De Nurse,  Feb 19, 2010 10:04 AM  Follow-up for Phone Call         called patient and told him that i do not have results and who scheduled the CT scan?  phone was disconnected and will retry again tomorrow.  otherwise will send a letter as i want to inform patient of need to return in six weeks as well as questions about who made him get the ct scan. Follow-up by: Magnus Ivan MD,  Feb 21, 2010 1:40 PM  Additional Follow-up for Phone Call Additional follow up Details #1::        phone is still disconnected.  will send patient a letter to inform him of needing to know who sent him for a CT scan and need to follow up, now, in about 4 weeks. Additional Follow-up by: Magnus Ivan MD,  Feb 26, 2010 4:08 PM

## 2010-10-29 NOTE — Letter (Signed)
Summary: Generic Letter  [facility]  [address]   [place], [place] [address]   Phone: [phone]  Fax: [phone]    02/26/2010  Jaime Barnes [address] [place], [place]  [address]  Dear Jaime Barnes,  I am writing you concerning two things.  First, at your last visit we discussed the fact that you had a CT scan and a lesion was found in your lungs.  I still have not gotten the results, and I am curious who sent you for the scan so that you can get proper follow up.  Furthermore, after your visit, I decided that you needed to come see me in a month from now so that we can recheck your lipid levels after stopping SIMVASTATIN.  Also, we want to check the lab value that tells [place] about your kidneys.  Before your appointment to see me at the end of next month, please schedule an appointment to get a FASTING lipid panel and the test that measures your kidney function so that we can discuss the results at your next clinical appointment.  Thank you and be blessed!  Sincerely,    Magnus Ivan MD

## 2010-10-29 NOTE — Letter (Signed)
Summary: Generic Letter  [facility]  [address]   [place], [place] [address]   Phone: [phone]  Fax: [phone]    01/01/2010  Jaime Barnes [address] [place], [place]  [address]  Dear [name],  This letter is to inform you that your insurance requires prior auth for Vytorin, therefore Dr Leveda Anna has changed your cholestoral medication to Simvastatin.  He has sent a new Rx to [facility] on [address] for you to pick up.  Please call the office to set up a lab visit in 3 months to see how your cholesterol is doing on the new medication.  Also please give [place] a current phone number.  We were unable to reach you by phone with this information   Sincerely,   Gladstone Pih

## 2010-10-29 NOTE — Consult Note (Signed)
Summary: [facility]  [facility]   Imported By: De Nurse 02/04/2010 09:41:52  _____________________________________________________________________  External Attachment:    Type:   Image     Comment:   External Document

## 2010-10-29 NOTE — Assessment & Plan Note (Signed)
Summary: f/up,tcb   Vital Signs:  Patient profile:   60 year old male Weight:      172.9 pounds Temp:     98.1 degrees F oral Pulse rate:   59 / minute Pulse rhythm:   regular BP sitting:   111 / 77  (left arm) Cuff size:   regular  Vitals Entered By: Loralee Pacas CMA (March 28, 2010 2:05 PM) CC: follow-up visit   Primary Care Provider:  Delbert Harness MD  CC:  follow-up visit.  History of Present Illness: chest discomfort- located to L of midline. no associated diaphoresis, radiation. no relationship to activity or walking. reproducible. he feels like it is muscle pain. no h/o MI. cath with no obstructing lesions in 2005. started last week. intermittent. has not tried any medications.     Habits & Providers  Alcohol-Tobacco-Diet     Tobacco Status: current     Tobacco Counseling: to quit use of tobacco products  Current Medications (verified): 1)  Albuterol 90 Mcg/act Aers (Albuterol) .... Inhale 1 Puff Every Four To Six Hours 2)  Benazepril-Hydrochlorothiazide 20-12.5 Mg Tabs (Benazepril-Hydrochlorothiazide) .... Take 1 Tablet By Mouth Once A Day 3)  Omeprazole 20 Mg Cpdr (Omeprazole) .[name].. 1 Tab By Mouth Once Daily 4)  Topamax 50 Mg Tabs (Topiramate) .[name].. 1 Tab By Mouth Two Times A Day 5)  Calan 120 Mg Tabs (Verapamil Hcl) .[name].. 1 Tab By Mouth Three Times A Day 6)  Ibuprofen 400 Mg Tabs (Ibuprofen) .... One Tab By Mouth Q8 Hours As Needed For Chest Pain  Allergies (verified): 1)  Amoxicillin (Amoxicillin) 2)  Codeine Phosphate (Codeine Phosphate)  Physical Exam  General:  NAD, alert and well-developed.  slightly dulled affect.  Mouth:  MMM Chest Wall:  reproducible TTP of L lateral chest wall.  Lungs:  Normal respiratory effort, chest expands symmetrically. Lungs are clear to auscultation, no crackles or wheezes. Heart:  bradycardic, regular rhythm.   Impression & Recommendations:  Problem # 1:  CHEST PAIN UNSPECIFIED (ICD-786.50) Assessment New no concerning  EKG changes. likely MSK in nature. rx for ibuprofen. red flags given to patient.   Orders: EKG- [facility] (EKG) [facility]- Est Level  3 ([address])  Patient Instructions: 1)  Follow up with Dr. Earnest Bailey in 3 months.  2)  Take the ibuprofen for your CHEST WALL PAIN Prescriptions: IBUPROFEN 400 MG TABS (IBUPROFEN) one tab by mouth q8 hours as needed for chest pain  #90 x 0   Entered and Authorized by:   Lequita Asal  MD   Signed by:   Lequita Asal  MD on 03/28/2010   Method used:   Electronically to        [facility] [address] (retail)       [address] [place], [place]  [address]       Ph: [phone]       Fax: [phone]   RxID:   [id number] CALAN 120 MG TABS (VERAPAMIL HCL) 1 tab by mouth three times a day  #270 x 1   Entered and Authorized by:   Lequita Asal  MD   Signed by:   Lequita Asal  MD on 03/28/2010   Method used:   Electronically to        [facility] [address] (retail)       [address] [place]       [place]  [place], [place]  [address]       Ph: [phone]       Fax: [phone]   RxID:   [id number] TOPAMAX 50 MG TABS (TOPIRAMATE) 1 tab by mouth two times a day  #180 x 1   Entered and Authorized by:   Lequita Asal  MD   Signed by:   Lequita Asal  MD on 03/28/2010   Method used:   Electronically to        [facility] [address] (retail)       [address] [place], [place]  [address]       Ph: [phone]       Fax: [phone]   RxID:   [id number] OMEPRAZOLE 20 MG CPDR (OMEPRAZOLE) 1 tab by mouth once daily  #90 x 1   Entered and Authorized by:   Lequita Asal  MD   Signed by:   Lequita Asal  MD on 03/28/2010   Method used:   Electronically to        [facility] [address] (retail)       [address] [place], [place]  [address]       Ph: [phone]       Fax: [phone]   RxID:    [id number] BENAZEPRIL-HYDROCHLOROTHIAZIDE 20-12.5 MG TABS (BENAZEPRIL-HYDROCHLOROTHIAZIDE) Take 1 tablet by mouth once a day  #90 x 1   Entered and Authorized by:   Lequita Asal  MD   Signed by:   Lequita Asal  MD on 03/28/2010   Method used:   Electronically to        [facility] [address] (retail)       [address] [place], [place]  [address]       Ph: [phone]       Fax: [phone]   RxID:   [id number]

## 2010-10-29 NOTE — Progress Notes (Signed)
Summary: Rx Req   Phone Note Refill Request Call back at Home Phone [phone] Message from:  Patient  Refills Requested: Medication #1:  TOPAMAX 50 MG TABS 1 tab by mouth two times a day  Medication #2:  BENAZEPRIL-HYDROCHLOROTHIAZIDE 20-12.5 MG TABS Take 1 tablet by mouth once a day  Medication #3:  OMEPRAZOLE 20 MG CPDR 1 tab by mouth bid  Medication #4:  VYTORIN 10-20 MG  TABS one tab by mouth daily Pt uses [facility] on [address].  Initial call taken by: Clydell Hakim,  December 27, 2009 3:52 PM  Follow-up for Phone Call        will forward to MD. Follow-up by: Theresia Lo RN,  December 27, 2009 3:55 PM    Prescriptions: VYTORIN 10-20 MG  TABS (EZETIMIBE-SIMVASTATIN) one tab by mouth daily  #30 x 3   Entered and Authorized by:   Denny Levy MD   Signed by:   Denny Levy MD on 12/28/2009   Method used:   Electronically to        [facility] [address] (retail)       [address]       [place], [place]  [address]       Ph: [phone]       Fax: [phone]   RxID:   [id number] TOPAMAX 50 MG TABS (TOPIRAMATE) 1 tab by mouth two times a day  #60 x 3   Entered and Authorized by:   Denny Levy MD   Signed by:   Denny Levy MD on 12/28/2009   Method used:   Electronically to        [facility] [address] (retail)       [address]       [place], [place]  [address]       Ph: [phone]       Fax: [phone]   RxID:   [id number] OMEPRAZOLE 20 MG CPDR (OMEPRAZOLE) 1 tab by mouth bid  #60 x 11   Entered and Authorized by:   Denny Levy MD   Signed by:   Denny Levy MD on 12/28/2009   Method used:   Electronically to        [facility] [address] (retail)       [address]       [place], [place]  [address]       Ph: [phone]       Fax: [phone]   RxID:   [id number] BENAZEPRIL-HYDROCHLOROTHIAZIDE 20-12.5 MG TABS (BENAZEPRIL-HYDROCHLOROTHIAZIDE) Take 1 tablet by mouth once a day   #30 x 6   Entered and Authorized by:   Denny Levy MD   Signed by:   Denny Levy MD on 12/28/2009   Method used:   Electronically to        [facility] [address] (retail)       [address]       [place], [place]  [address]       Ph: [phone]       Fax: [phone]   RxID:   [id number]

## 2010-10-29 NOTE — Assessment & Plan Note (Signed)
Summary: f/up,tcb   Vital Signs:  Patient profile:   60 year old male Height:      68.25 inches Weight:      174 pounds BMI:     26.36 Temp:     98.4 degrees F oral Pulse rate:   75 / minute BP sitting:   116 / 76  (right arm) Cuff size:   regular  Vitals Entered By: Tessie Fass CMA (June 26, 2010 1:40 PM) CC: F/U Is Patient Diabetic? No Pain Assessment Patient in pain? no        Primary Care Provider:  Delbert Harness MD  CC:  F/U.  History of Present Illness: 60 yo here for follow-up  Nasal congestion:  several weeks, has tried afrin twice a day for several days.  no fevers, dyspnea.  nonproductive cough, runny nose.  abd pain:  points to left groin iand overall abdomen.  last felt the pain 3 days.  pain is sharp, 5-6, lasts for 3-4 seconds at a time.    No abdominal surgery. No worse with position or heavy lifintg. No nausea, vomiting, fever.  Better with food.  does not feel bulges.    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  Does not know what he was told it was.  Had it done in prison.  Minimal pain.  No impairment infunction, does not like the way it looks.  migraine:  states that he has had "none, not even one" headache since starting prophylaxis.  Habits & Providers  Alcohol-Tobacco-Diet     Tobacco Status: current     Tobacco Counseling: to quit use of tobacco products     Cigarette Packs/Day: 0.5  Current Medications (verified): 1)  Albuterol 90 Mcg/act Aers (Albuterol) .... Inhale 1 Puff Every Four To Six Hours 2)  Benazepril-Hydrochlorothiazide 20-12.5 Mg Tabs (Benazepril-Hydrochlorothiazide) .... Take 1 Tablet By Mouth Once A Day 3)  Omeprazole 20 Mg Cpdr (Omeprazole) .[name].. 1 Tab By Mouth Once Daily 4)  Topamax 50 Mg Tabs (Topiramate) .[name].. 1 Tab By Mouth Two Times A Day 5)  Calan 120 Mg Tabs (Verapamil Hcl) .[name].. 1 Tab By Mouth Three Times A Day  Allergies: 1)  Amoxicillin (Amoxicillin) 2)  Codeine Phosphate  (Codeine Phosphate) PMH reviewed for relevance  Social History: Packs/Day:  0.5  Review of Systems      See HPI  Physical Exam  General:  Well-developed,well-nourished,in no acute distress; alert,appropriate and cooperative throughout examination Lungs:  normal respiratory effort and normal breath sounds.   Heart:  normal rate and regular rhythm.   Abdomen:  + bS, soft, mildly tender in LLQ, no rebound or guarding.  when he points to wwere pain is, it is not in the groin, but in the abdomen.  no scars or evidence of hernia. Extremities:  Right ganglion cyst 1-1.5 cm in diameter, soft located near wrist on ventral radial side.  nontender to palpation.  Two wellhealed surgical scars located proximally to this by several inches.   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  Given it has been operated  on twice per patient amd has no pain or loss of function, would not reocmmend surgery.  Advised Aspiration/ steroid injection may be an option in the future if it continued to bother him but with the understanding that it is likely to recur.  Orders: [facility]- Est  Level 4 ([address])  Problem # 2:  Sx of SYMPTOM, PAIN, ABDOMINAL, LEFT LOWER QUADRANT (ICD-789.04)  possibly diverticulitis as patient has hisory of this.  very minimal intermittant pain, frequently self resolved, no fever or other systemic signs.  Advised to watch and red flags for return for evaluation.  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  Orders: [facility]- Est  Level 4 ([address])  Problem # 3:  Sx of ALLERGIC RHINITIS, SEASONAL (ICD-477.0)  had allergic rhinitis in past, no signs of bacterial infection.  May also be due to afrin use.  advised discontinuation.  Orders: [facility]- Est  Level 4 ([address])  Problem # 4:  MIGRAINE, UNSPEC., W/O  INTRACTABLE MIGRAINE (ICD-346.90)  On topamax.  Unusually good control.  Continue current med.  Orders: [facility]- Est  Level 4 ([address])  Complete Medication List: 1)  Albuterol 90 Mcg/act Aers (Albuterol) .... Inhale 1 puff every four to six hours 2)  Benazepril-hydrochlorothiazide 20-12.5 Mg Tabs (Benazepril-hydrochlorothiazide) .... Take 1 tablet by mouth once a day 3)  Omeprazole 20 Mg Cpdr (Omeprazole) .[name].. 1 tab by mouth once daily 4)  Topamax 50 Mg Tabs (Topiramate) .[name].. 1 tab by mouth two times a day 5)  Calan 120 Mg Tabs (Verapamil hcl) .[name].. 1 tab by mouth three times a day  Patient Instructions: 1)  Dont forget your flu shot in October 2)  I think you have a cyst on your wrist.  I dont think you need to do anything with it- it is not dangerous.  if you want to try to get some of the fluid out, please make an appt and I will use a needle to get it out.  It will likely keep coming back. 3)  If you have belly pain with nausea, vominting, fever, changesi n yoru stool, or worsening, please come back 4)  try quit-line for stopping smoking 5)  Return in 6 months or sooner if needed.

## 2010-10-29 NOTE — Assessment & Plan Note (Signed)
Summary: f/u test results,df   Vital Signs:  Patient profile:   60 year old male Height:      68.25 inches Weight:      177.3 pounds BMI:     26.86 Temp:     98.3 degrees F oral Pulse rate:   67 / minute BP sitting:   133 / 83  (left arm) Cuff size:   regular  Vitals Entered By: Garen Grams LPN (Feb 13, 2010 1:35 PM) CC: f/u test results Is Patient Diabetic? No Pain Assessment Patient in pain? no        Primary Care Provider:  Magnus Ivan MD  CC:  f/u test results.  History of Present Illness: f/u on test results: Patient wanting test results from colonoscopy and CT scan.  Also, informed patient about change in creatinine and lipid information.  Habits & Providers  Alcohol-Tobacco-Diet     Tobacco Status: current     Tobacco Counseling: to quit use of tobacco products  Allergies: 1)  Amoxicillin (Amoxicillin) 2)  Codeine Phosphate (Codeine Phosphate)  Physical Exam  General:  NAD, alert and well-developed.   Psych:  normally interactive, good eye contact, not anxious appearing, not depressed appearing, and not agitated.     Impression & Recommendations:  Problem # 1:  HYPERLIPIDEMIA (ICD-272.4) Assessment Unchanged  Patient with lipids that are stable.  D/c'ed simvastatin secondary to possibility of causing past abdominal pain on recent visit and might have an adverse reaction on kidneys (low possibility).  Will f/u on fasting lipids in 6 weeks. The following medications were removed from the medication list:    Simvastatin 20 Mg Tabs (Simvastatin) .[name]... 1 by mouth at bedtime  Orders: [facility]- Est  Level 4 ([address])  Problem # 2:  NONSPECIFIC ABNORM RESULTS KIDNEY FUNCTION STUDY (ICD-794.4) Assessment: New  Patient with recent increase in creatinine of unknown etiology.  Will follow up on results in 6 weeks.  Orders: [facility]- Est  Level 4 ([address])  Problem # 3:  TOBACCO USER (ICD-305.1) Assessment: Unchanged  Patient continues to smoke despite  continuing to advise him to stop smoking.  Patient had recent CT scan of chest secondary to a nodule seen on lungs (unsure as to how/why this was done).  Need to follow up on reasoning and other diagnositic tests for clinical purposes and for the purpose of reporting information to patient.   Orders: [facility]- Est  Level 4 ([address])  Complete Medication List: 1)  Albuterol 90 Mcg/act Aers (Albuterol) .... Inhale 1 puff every four to six hours 2)  Benazepril-hydrochlorothiazide 20-12.5 Mg Tabs (Benazepril-hydrochlorothiazide) .... Take 1 tablet by mouth once a day 3)  Omeprazole 20 Mg Cpdr (Omeprazole) .[name].. 1 tab by mouth once daily 4)  Topamax 50 Mg Tabs (Topiramate) .[name].. 1 tab by mouth two times a day 5)  Calan 120 Mg Tabs (Verapamil hcl) .[name].. 1 tab by mouth three times a day 6)  Proair Hfa 108 (90 Base) Mcg/act Aers (Albuterol sulfate) .[name].. 1-2 puffs inh q 4 hrs as needed wheezing 7)  Viagra 50 Mg Tabs (Sildenafil citrate) .[name].. 1 tab by mouth before sexual activity 8)  Azithromycin 500 Mg Tabs (Azithromycin) .... Take 1 pill by mouth once a day  for 5 days  Patient Instructions: 1)  It was good to see you today Jaime Barnes!   2)  We are going to stop the drug SIMVISTATIN.  It could have caused your stomach pain and might be affecting your kidneys.  You can  stop taking this drug. 3)  I will call you with the results of the CT scan as soon as I get them. 4)  Please make an appointment in July to collect a FASTING lipid sample to monitor any changes in your lipids. 5)  Please call the [facility] at [phone] if you have any questions or go to the local emergency department. 6)  Thank you and be blessed!  Appended Document: Orders Update     Clinical Lists Changes  Orders: Added new Test order of Basic Met-[facility] [phone]) - Signed Added new Test order of Lipid-[facility] ([id number]) - Signed

## 2010-10-29 NOTE — Miscellaneous (Signed)
Summary: Cholesterol med change  Medications Added SIMVASTATIN 20 MG TABS (SIMVASTATIN) 1 by mouth at bedtime       Clinical Lists Changes Received pharm fax that vytorin needs prior auth.  Has been on Vytorin long term.  Last few FLPs show quite low LDL.  Will switch to generic simvastatin and check FLP in 3 months.  Attempted to call, no answer  Doralee Albino MD  January 01, 2010 11:10 AM  Medications: Changed medication from VYTORIN 10-20 MG  TABS (EZETIMIBE-SIMVASTATIN) one tab by mouth daily to SIMVASTATIN 20 MG TABS (SIMVASTATIN) 1 by mouth at bedtime - Signed Rx of SIMVASTATIN 20 MG TABS (SIMVASTATIN) 1 by mouth at bedtime;  #90 x 3;  Signed;  Entered by: Doralee Albino MD;  Authorized by: Doralee Albino MD;  Method used: Electronically to [facility] [address], [place], [place], [place]  [address], Ph: [phone], Fax: [phone] Orders: Added new Test order of Lipid-[facility] ([id number]) - Signed    Prescriptions: SIMVASTATIN 20 MG TABS (SIMVASTATIN) 1 by mouth at bedtime  #90 x 3   Entered and Authorized by:   Doralee Albino MD   Signed by:   Doralee Albino MD on 01/01/2010   Method used:   Electronically to        [facility] [address] (retail)       [address] [place], [place]  [address]       Ph: [phone]       Fax: [phone]   RxID:   [id number]  Unable to reach by phone, letter sent.Gladstone Pih  January 01, 2010 4:33 PM

## 2010-10-29 NOTE — Miscellaneous (Signed)
Summary: medical record request  Clinical Lists Changes  Rec'd medical record request to go to: [facility] date sent: 09/09/2010 Marily Memos  August 27, 2010 12:07 PM

## 2010-10-29 NOTE — Letter (Signed)
Summary: Results Follow-up Letter  [facility]  [address]   [place], [place] [address]   Phone: [phone]  Fax: [phone]    01/14/2010  [address] [place], [place]  [address]  Dear [name],   The following are the results of your recent test(s):  Your choleserol continues to be very low.  I think it would be reasonable to stop you vytorin or simvastatin (cholesterol medications) and we will rechekc your cholesterol in 3 months to see if you still need to be on these medications.  We will also continue to watch you kidney function closely to make sure it doesn't worsen over time.  Please eel free to contact the office if you have further questions.  Sincerely,  Delbert Harness MD [facility]           Appended Document: Results Follow-up Letter mailed.

## 2010-10-29 NOTE — Assessment & Plan Note (Signed)
Summary: stomach pain,tcb   Vital Signs:  Patient profile:   60 year old male Weight:      180.8 pounds Temp:     97.9 degrees F oral Pulse rate:   65 / minute BP sitting:   100 / 64  (left arm) Cuff size:   regular  Vitals Entered By: Loralee Pacas CMA (January 11, 2010 3:10 PM) Comments stomach pain x 2 weeks feels like a knot   Primary Care Provider:  Magnus Ivan MD   History of Present Illness: 60 yo seen for work-in appt for 2 weeks of epigastric pain.  Epigastric pain: 60 yo with history of Barrett's esophagus here with two weeks of epigastric pain.  Mild to moderate, intermittant pain 4-5/10.  Not worsening.  Not associated with food,  Worse with lying down.  Does not radiate to back.  No burning.  He is worried is it his Barrett's esophagus and wants to go back to see gastroenterologist.   No N/V/D.  Not worse with lifting or activity.  HYPERTENSION Meds: Taking and tolerating? yes Home BP's: no Chest Pain: no Dyspnea: no lightheadedness: no   Habits & Providers  Alcohol-Tobacco-Diet     Tobacco Status: current     Tobacco Counseling: to quit use of tobacco products     Cigarette Packs/Day: <0.25  Current Medications (verified): 1)  Albuterol 90 Mcg/act Aers (Albuterol) .... Inhale 1 Puff Every Four To Six Hours 2)  Benazepril-Hydrochlorothiazide 20-12.5 Mg Tabs (Benazepril-Hydrochlorothiazide) .... Take 1 Tablet By Mouth Once A Day 3)  Omeprazole 20 Mg Cpdr (Omeprazole) .[name].. 1 Tab By Mouth Once Daily 4)  Topamax 50 Mg Tabs (Topiramate) .[name].. 1 Tab By Mouth Two Times A Day 5)  Calan 120 Mg Tabs (Verapamil Hcl) .[name].. 1 Tab By Mouth Three Times A Day 6)  Proair Hfa 108 (90 Base) Mcg/act  Aers (Albuterol Sulfate) .[name].. 1-2 Puffs Inh Q 4 Hrs As Needed Wheezing 7)  Simvastatin 20 Mg Tabs (Simvastatin) .[name].. 1 By Mouth At Bedtime 8)  Viagra 50 Mg Tabs (Sildenafil Citrate) .[name].. 1 Tab By Mouth Before Sexual Activity 9)  Azithromycin 500 Mg Tabs (Azithromycin)  .... Take 1 Pill By Mouth Once A Day  For 5 Days  Allergies: 1)  Amoxicillin (Amoxicillin) 2)  Codeine Phosphate (Codeine Phosphate) PMH-FH-SH reviewed-no changes except otherwise noted  Social History: Smoking Status:  current Packs/Day:  <0.25  Review of Systems      See HPI General:  Denies fever, loss of appetite, sweats, and weight loss. CV:  Denies chest pain or discomfort. Resp:  Denies cough and shortness of breath. GI:  Complains of abdominal pain; denies bloody stools, change in bowel habits, constipation, diarrhea, nausea, and vomiting.  Physical Exam  General:  Well-developed,well-nourished,in no acute distress; alert,appropriate and cooperative throughout examination Lungs:  normal respiratory effort and normal breath sounds.   Heart:  normal rate and regular rhythm.   Abdomen:  soft, non-tender to palpation, and normal bowel sounds.  Not currently painful per patient.  No abdominal scars, tattoo present.  No evidence of epigastric hernia.   Impression & Recommendations:  Problem # 1:  ABDOMINAL PAIN, EPIGASTRIC (ICD-789.06) Unclear etiology of pain for past two weeks.  Not currently having pain.  Patient with a hsitory of pancreatitis, Barret's esophagus, remote hx of peptic ulcer.  WIll draw LFT's, Lipase, and H. Pylori.  Review EGD and Biopsy performed May 2010.  Biopsy showed no dysplasia and GI recommends follow-up in 2 years.  Will review bloodwork with patient.  Provided contact info if patient wishes to schedule follow-up appt with GI.  Orders: Comp Met-[facility] [phone]) Direct LDL-[facility] [phone]) Lipase-[facility] [phone]) H pylori-[facility] (57846) CBC-[facility] (96295) [facility]- Est  Level 4 (28413)  Problem # 2:  BARRETTS ESOPHAGUS (ICD-530.85)  Patient has been taking omeprazole 20 mg two times a day per chart.  He states he is taking 2 tabs qam.  I do nto see an indication for this and have changed to 20 mg once daily.  Orders: [facility]- Est  Level 4  (24401)  Problem # 3:  HYPERLIPIDEMIA (ICD-272.4)  Has not had lipids check in a year.  Recent change from Vytorin to Simvastatin.  Previous LDL's all very low.  Will get baseline today as he is not fasting, and will follow-up with full lipid panel in 3 months after he has been on simvastatin to monitor change in medication.   His updated medication list for this problem includes:    Simvastatin 20 Mg Tabs (Simvastatin) .[name]... 1 by mouth at bedtime  Labs Reviewed: SGOT: 14 (08/21/2009)   SGPT: 10 (08/21/2009)   HDL:28 (01/23/2009), 38 (12/20/2007)  LDL:46 (01/23/2009), 48 (12/20/2007)  Chol:115 (01/23/2009), 101 (12/20/2007)  Trig:204 (01/23/2009), 77 (12/20/2007)  Orders: [facility]- Est  Level 4 (02725)  Problem # 4:  HYPERTENSION, BENIGN SYSTEMIC (ICD-401.1) BP low today.  Asymptomatic.  Prior BP uncontrolled.  WIll not make changes today.  Advised daily BP home checks and to bring to next appt. Will check CMET.  If continues to be low can decrease BP meds.   His updated medication list for this problem includes:    Benazepril-hydrochlorothiazide 20-12.5 Mg Tabs (Benazepril-hydrochlorothiazide) .[name]... Take 1 tablet by mouth once a day    Calan 120 Mg Tabs (Verapamil hcl) .[name]... 1 tab by mouth three times a day  Orders: [facility]- Est  Level 4 (99214)  BP today: 100/64 Prior BP: 170/100 (08/28/2009)  Labs Reviewed: K+: 3.5 (08/21/2009) Creat: : 1.32 (08/21/2009)   Chol: 115 (01/23/2009)   HDL: 28 (01/23/2009)   LDL: 46 (01/23/2009)   TG: 204 (01/23/2009)  Complete Medication List: 1)  Albuterol 90 Mcg/act Aers (Albuterol) .... Inhale 1 puff every four to six hours 2)  Benazepril-hydrochlorothiazide 20-12.5 Mg Tabs (Benazepril-hydrochlorothiazide) .... Take 1 tablet by mouth once a day 3)  Omeprazole 20 Mg Cpdr (Omeprazole) .[name].. 1 tab by mouth once daily 4)  Topamax 50 Mg Tabs (Topiramate) .[name].. 1 tab by mouth two times a day 5)  Calan 120 Mg Tabs (Verapamil hcl) .[name].. 1 tab by mouth three times  a day 6)  Proair Hfa 108 (90 Base) Mcg/act Aers (Albuterol sulfate) .[name].. 1-2 puffs inh q 4 hrs as needed wheezing 7)  Simvastatin 20 Mg Tabs (Simvastatin) .[name].. 1 by mouth at bedtime 8)  Viagra 50 Mg Tabs (Sildenafil citrate) .[name].. 1 tab by mouth before sexual activity 9)  Azithromycin 500 Mg Tabs (Azithromycin) .... Take 1 pill by mouth once a day  for 5 days  Patient Instructions: 1)  Will draw blood work, I will call you if results are abnormal.  I will send you a letter if things look good.  2)  You saw Dr. Bosie Clos: [facility]  3)  [address])  [place], [place] [address] 5)  Phone: [phone]  Prescriptions: OMEPRAZOLE 20 MG CPDR (OMEPRAZOLE) 1 tab by mouth once daily  #30 x 5   Entered and Authorized by:   Delbert Harness MD  Signed by:   Delbert Harness MD on 01/11/2010   Method used:   Electronically to        [facility] [id number] (retail)       [address] [place], [place]  [address]       Ph: [phone]       Fax: [phone]   RxID:   [id number]    Prevention & Chronic Care Immunizations   Influenza vaccine: Fluvax Non-MCR  (06/23/2008)   Influenza vaccine due: 06/23/2009    Tetanus booster: Not documented    Pneumococcal vaccine: Not documented  Colorectal Screening   Hemoccult: negative  (12/08/2006)   Hemoccult due: 12/08/2007    Colonoscopy: Done.  (05/04/2005)   Colonoscopy due: 05/05/2015  Other Screening   PSA: 0.33  (08/21/2009)   PSA due due: 06/27/2009   Smoking status: current  (01/11/2010)   Smoking cessation counseling: yes  (08/02/2008)  Lipids   Total Cholesterol: 115  (01/23/2009)   LDL: 46  (01/23/2009)   LDL Direct: Not documented   HDL: 28  (01/23/2009)   Triglycerides: 204  (01/23/2009)    SGOT (AST): 14  (08/21/2009)   SGPT (ALT): 10  (08/21/2009) CMP ordered    Alkaline phosphatase: 61  (08/21/2009)   Total bilirubin: 0.4   (08/21/2009)  Hypertension   Last Blood Pressure: 100 / 64  (01/11/2010)   Serum creatinine: 1.32  (08/21/2009)   Serum potassium 3.5  (08/21/2009) CMP ordered     Hypertension flowsheet reviewed?: Yes   Progress toward BP goal: At goal  Self-Management Support :    Hypertension self-management support: Not documented    Lipid self-management support: Not documented     Appended Document: H. pylori = negative     Lab Visit  Laboratory Results   Blood Tests   Date/Time Received: January 11, 2010 4:15 PM  Date/Time Reported: January 11, 2010 5:28 PM    H. pylori: negative Comments: ...............test performed by......Marland KitchenBonnie A. [place], MLS (ASCP)cm    Orders Today:

## 2010-10-29 NOTE — Letter (Signed)
Summary: Results Follow-up Letter  [facility]  [address]   [place], [place] [address]   Phone: [phone]  Fax: [phone]    03/14/2010  [address] [place], [place]  [address]  Dear Mr. BRISENO,   The following are the results of your recent test(s): We will discuss your cholesterol medication in further detail at your upcoming visit.   Sodium                    137 mEq/L                   135-145   Potassium                 4.0 mEq/L                   3.5-5.3   Chloride                  105 mEq/L                   96-112   CO2                       22 mEq/L                    19-32   Glucose                   93 mg/dL                    96-29   BUN                       14 mg/dL                    5-28   Creatinine                1.38 mg/dL                  0.40-1.50   Calcium                   9.1 mg/dL                   4.1-32.4  Tests: (2) Lipid Profile (40102)   Cholesterol               179 mg/dL                   7-253     ATP III Classification:           < 200        mg/dL        Desirable          200 - 239     mg/dL        Borderline High          >= 240        mg/dL        High         Triglyceride         [H]  238 mg/dL                   <664   HDL Cholesterol      [L]  33 mg/dL                    >  39   Total Chol/HDL Ratio      5.4 Ratio  VLDL Cholesterol (Calc)                        [H]  48 mg/dL                    9-81  LDL Cholesterol (Calc)                             98 mg/dL                    1-91   Sincerely,  Delbert Harness MD [facility]           Appended Document: Results Follow-up Letter MAILED.

## 2010-10-31 NOTE — Consult Note (Signed)
Summary: [facility]   Imported By: De Nurse 10/14/2010 13:43:00  _____________________________________________________________________  External Attachment:    Type:   Image     Comment:   External Document

## 2010-11-04 ENCOUNTER — Encounter: Payer: Self-pay | Admitting: Family Medicine

## 2010-11-14 ENCOUNTER — Encounter: Payer: Self-pay | Admitting: Family Medicine

## 2010-11-14 ENCOUNTER — Ambulatory Visit (INDEPENDENT_AMBULATORY_CARE_PROVIDER_SITE_OTHER): Payer: Medicaid Other | Admitting: Family Medicine

## 2010-11-14 VITALS — BP 136/79 | HR 69 | Temp 98.5°F | Ht 66.0 in | Wt 176.7 lb

## 2010-11-14 DIAGNOSIS — R059 Cough, unspecified: Secondary | ICD-10-CM | POA: Insufficient documentation

## 2010-11-14 DIAGNOSIS — I10 Essential (primary) hypertension: Secondary | ICD-10-CM

## 2010-11-14 DIAGNOSIS — K2270 Barrett's esophagus without dysplasia: Secondary | ICD-10-CM

## 2010-11-14 DIAGNOSIS — F319 Bipolar disorder, unspecified: Secondary | ICD-10-CM

## 2010-11-14 DIAGNOSIS — R05 Cough: Secondary | ICD-10-CM

## 2010-11-14 DIAGNOSIS — E785 Hyperlipidemia, unspecified: Secondary | ICD-10-CM

## 2010-11-14 LAB — LIPID PANEL
Cholesterol: 141 mg/dL (ref 0–200)
HDL: 29 mg/dL — ABNORMAL LOW (ref >39)
Total CHOL/HDL Ratio: 4.9 Ratio
VLDL: 46 mg/dL — ABNORMAL HIGH (ref 0–40)

## 2010-11-14 LAB — COMPREHENSIVE METABOLIC PANEL
AST: 14 U/L (ref 0–37)
Alkaline Phosphatase: 64 U/L (ref 39–117)
BUN: 15 mg/dL (ref 6–23)
Glucose, Bld: 88 mg/dL (ref 70–99)
Sodium: 139 mEq/L (ref 135–145)
Total Bilirubin: 0.4 mg/dL (ref 0.3–1.2)

## 2010-11-14 MED ORDER — BENAZEPRIL-HYDROCHLOROTHIAZIDE 20-12.5 MG PO TABS: 1.0000 | ORAL_TABLET | Freq: Every day | ORAL | Status: DC

## 2010-11-14 MED ORDER — VERAPAMIL HCL 120 MG PO TABS: 120.0000 mg | ORAL_TABLET | Freq: Three times a day (TID) | ORAL | Status: DC

## 2010-11-14 MED ORDER — ALBUTEROL 90 MCG/ACT IN AERS: 2.0000 | INHALATION_SPRAY | Freq: Four times a day (QID) | RESPIRATORY_TRACT | Status: DC | PRN

## 2010-11-14 MED ORDER — OMEPRAZOLE 20 MG PO CPDR: 20.0000 mg | DELAYED_RELEASE_CAPSULE | Freq: Every day | ORAL | Status: DC

## 2010-11-14 NOTE — Assessment & Plan Note (Signed)
Patient on Lotensin HCT but per cardiology records he is on propranolol once a day vs it appears he has been on verapamil for a long time with this practice.  Will refill verapamil, advised patient to have his fiancee help him checking his medications when he gets home and if he finds difference, to let me know.

## 2010-11-14 NOTE — Assessment & Plan Note (Addendum)
Check lipid panel today.  Appears to have been changed from simvastatin to vytorin per cardiology note.  Patient is unable to tell me what he is taking.  Will forward labs to Dr Herbie Baltimore.- [facility]

## 2010-11-14 NOTE — Progress Notes (Signed)
  Subjective:    Patient ID: Jaime Barnes, male    DOB: [birth year], 60 y.o.   MRN: [id number]  HPI  Patient recently discharged from mental institution for "hearing things"  This was in [facility], no medical records available.  States this has improved.   Cough:  Patient concerned wanting to be checked for pneumonia due to cough x 2-3 days.  No fever or dyspnea.  Uses albuterol inhaler twice a day but states he does not feel dyspneic if he does not take it.  He just thought you were supposed to take it regularly  + smoker, no sputum.  Hypertension:  He is unclear with what medications he is taking, did not bring them today.  No chest pain, edema,   Review of Systems negative exept as per hpi.     Objective:   Physical Exam  Constitutional: He appears well-developed and well-nourished.  Cardiovascular: Normal rate, regular rhythm and normal heart sounds.   No murmur heard. Pulmonary/Chest: Effort normal and breath sounds normal. No respiratory distress. He has no wheezes. He has no rales.          Assessment & Plan:

## 2010-11-14 NOTE — Assessment & Plan Note (Addendum)
Recently discharged with addition of trazodone and risperdal to his topamax.  Managed by Mental Health.

## 2010-11-14 NOTE — Patient Instructions (Signed)
Today we checked your cholesterol  Here is a list of your medications- if you get home and it looks different let me know Follow-up in 3 months or sooner if needed

## 2010-11-18 ENCOUNTER — Encounter: Payer: Self-pay | Admitting: Family Medicine

## 2010-12-04 ENCOUNTER — Other Ambulatory Visit: Payer: Self-pay | Admitting: Family Medicine

## 2010-12-04 DIAGNOSIS — I10 Essential (primary) hypertension: Secondary | ICD-10-CM

## 2010-12-04 MED ORDER — BENAZEPRIL-HYDROCHLOROTHIAZIDE 20-12.5 MG PO TABS: 1.0000 | ORAL_TABLET | Freq: Every day | ORAL | Status: DC

## 2010-12-28 ENCOUNTER — Other Ambulatory Visit: Payer: Self-pay | Admitting: Family Medicine

## 2010-12-28 DIAGNOSIS — I10 Essential (primary) hypertension: Secondary | ICD-10-CM

## 2010-12-28 MED ORDER — BENAZEPRIL-HYDROCHLOROTHIAZIDE 20-12.5 MG PO TABS: 1.0000 | ORAL_TABLET | Freq: Every day | ORAL | Status: DC

## 2010-12-31 LAB — COMPREHENSIVE METABOLIC PANEL
ALT: 18 U/L (ref 0–53)
AST: 19 U/L (ref 0–37)
Albumin: 3.8 g/dL (ref 3.5–5.2)
Alkaline Phosphatase: 62 U/L (ref 39–117)
Calcium: 9.3 mg/dL (ref 8.4–10.5)
GFR calc Af Amer: >60 mL/min (ref >60)
Potassium: 4 mEq/L (ref 3.5–5.1)
Sodium: 141 mEq/L (ref 135–145)
Total Protein: 6.4 g/dL (ref 6.0–8.3)

## 2010-12-31 LAB — CK TOTAL AND CKMB (NOT AT ARMC)
CK, MB: 1.7 ng/mL (ref 0.3–4.0)
Total CK: 201 U/L (ref 7–232)

## 2011-01-14 LAB — POCT URINALYSIS DIP (DEVICE)
Glucose, UA: NEGATIVE mg/dL
Hgb urine dipstick: NEGATIVE
Specific Gravity, Urine: 1.02 (ref 1.005–1.030)

## 2011-01-25 ENCOUNTER — Emergency Department (HOSPITAL_COMMUNITY): Payer: Medicaid Other

## 2011-01-25 ENCOUNTER — Emergency Department (HOSPITAL_COMMUNITY)
Admission: EM | Admit: 2011-01-25 | Discharge: 2011-01-25 | Disposition: A | Discharge status: Home / Self Care | Payer: Medicaid Other | Attending: Emergency Medicine | Admitting: Emergency Medicine

## 2011-01-25 ENCOUNTER — Inpatient Hospital Stay (INDEPENDENT_AMBULATORY_CARE_PROVIDER_SITE_OTHER)
Admission: RE | Admit: 2011-01-25 | Discharge: 2011-01-25 | Disposition: A | Discharge status: Home / Self Care | Payer: Medicaid Other | Source: Ambulatory Visit | Attending: Emergency Medicine | Admitting: Emergency Medicine

## 2011-01-25 DIAGNOSIS — I10 Essential (primary) hypertension: Secondary | ICD-10-CM | POA: Insufficient documentation

## 2011-01-25 DIAGNOSIS — R0989 Other specified symptoms and signs involving the circulatory and respiratory systems: Secondary | ICD-10-CM | POA: Insufficient documentation

## 2011-01-25 DIAGNOSIS — R0602 Shortness of breath: Secondary | ICD-10-CM | POA: Insufficient documentation

## 2011-01-25 DIAGNOSIS — J189 Pneumonia, unspecified organism: Secondary | ICD-10-CM | POA: Insufficient documentation

## 2011-01-25 DIAGNOSIS — F319 Bipolar disorder, unspecified: Secondary | ICD-10-CM | POA: Insufficient documentation

## 2011-01-25 DIAGNOSIS — R059 Cough, unspecified: Secondary | ICD-10-CM | POA: Insufficient documentation

## 2011-01-25 DIAGNOSIS — I2510 Atherosclerotic heart disease of native coronary artery without angina pectoris: Secondary | ICD-10-CM | POA: Insufficient documentation

## 2011-01-25 DIAGNOSIS — R079 Chest pain, unspecified: Secondary | ICD-10-CM

## 2011-01-25 DIAGNOSIS — E785 Hyperlipidemia, unspecified: Secondary | ICD-10-CM | POA: Insufficient documentation

## 2011-01-25 DIAGNOSIS — R05 Cough: Secondary | ICD-10-CM | POA: Insufficient documentation

## 2011-01-25 DIAGNOSIS — J45909 Unspecified asthma, uncomplicated: Secondary | ICD-10-CM | POA: Insufficient documentation

## 2011-01-25 DIAGNOSIS — Z79899 Other long term (current) drug therapy: Secondary | ICD-10-CM | POA: Insufficient documentation

## 2011-01-25 DIAGNOSIS — R509 Fever, unspecified: Secondary | ICD-10-CM | POA: Insufficient documentation

## 2011-01-25 DIAGNOSIS — K219 Gastro-esophageal reflux disease without esophagitis: Secondary | ICD-10-CM | POA: Insufficient documentation

## 2011-01-25 DIAGNOSIS — R0609 Other forms of dyspnea: Secondary | ICD-10-CM | POA: Insufficient documentation

## 2011-01-25 DIAGNOSIS — IMO0001 Reserved for inherently not codable concepts without codable children: Secondary | ICD-10-CM | POA: Insufficient documentation

## 2011-01-25 LAB — CK TOTAL AND CKMB (NOT AT ARMC)
CK, MB: 1.3 ng/mL (ref 0.3–4.0)
Total CK: 176 U/L (ref 7–232)

## 2011-01-25 LAB — BASIC METABOLIC PANEL
BUN: 11 mg/dL (ref 6–23)
Calcium: 9.1 mg/dL (ref 8.4–10.5)
Chloride: 107 mEq/L (ref 96–112)
Creatinine, Ser: 1.25 mg/dL (ref 0.4–1.5)
GFR calc Af Amer: >60 mL/min (ref >60)

## 2011-01-25 LAB — DIFFERENTIAL
Lymphocytes Relative: 11 % — ABNORMAL LOW (ref 12–46)
Lymphs Abs: 1.9 10*3/uL (ref 0.7–4.0)
Monocytes Absolute: 1.9 10*3/uL — ABNORMAL HIGH (ref 0.1–1.0)
Monocytes Relative: 10 % (ref 3–12)
Neutro Abs: 14.5 10*3/uL — ABNORMAL HIGH (ref 1.7–7.7)

## 2011-01-25 LAB — CBC
HCT: 34.3 % — ABNORMAL LOW (ref 39.0–52.0)
Hemoglobin: 12 g/dL — ABNORMAL LOW (ref 13.0–17.0)
MCH: 31 pg (ref 26.0–34.0)
MCHC: 35 g/dL (ref 30.0–36.0)
Platelets: 156 10*3/uL (ref 150–400)
RDW: 13.1 % (ref 11.5–15.5)

## 2011-01-25 LAB — APTT: aPTT: 34 seconds (ref 24–37)

## 2011-01-25 LAB — PROTIME-INR: Prothrombin Time: 15.1 seconds (ref 11.6–15.2)

## 2011-01-25 LAB — BRAIN NATRIURETIC PEPTIDE: Pro B Natriuretic peptide (BNP): 50 pg/mL (ref 0.0–100.0)

## 2011-02-06 ENCOUNTER — Ambulatory Visit (INDEPENDENT_AMBULATORY_CARE_PROVIDER_SITE_OTHER): Payer: Medicaid Other | Admitting: Family Medicine

## 2011-02-06 ENCOUNTER — Encounter: Payer: Self-pay | Admitting: Family Medicine

## 2011-02-06 VITALS — BP 124/85 | HR 73 | Temp 97.9°F | Ht 66.0 in | Wt 175.1 lb

## 2011-02-06 DIAGNOSIS — R9389 Abnormal findings on diagnostic imaging of other specified body structures: Secondary | ICD-10-CM

## 2011-02-06 DIAGNOSIS — R918 Other nonspecific abnormal finding of lung field: Secondary | ICD-10-CM

## 2011-02-06 DIAGNOSIS — R911 Solitary pulmonary nodule: Secondary | ICD-10-CM

## 2011-02-06 DIAGNOSIS — B009 Herpesviral infection, unspecified: Secondary | ICD-10-CM

## 2011-02-06 HISTORY — DX: Solitary pulmonary nodule: R91.1

## 2011-02-06 NOTE — Patient Instructions (Addendum)
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 Go to [facility] for Xray of you lung

## 2011-02-06 NOTE — Assessment & Plan Note (Addendum)
Will order for follow-up today

## 2011-02-06 NOTE — Progress Notes (Signed)
  Subjective:    Patient ID: Jaime Barnes, male    DOB: [birth year], 60 y.o.   MRN: [id number]  HPI ER f/u: was seen April 28th, had cough, was given doxycycline.  Patient states he is better, cough is resolved, no dyspnea.  He reports stopping smoking since that time and plans to continue.  XRAY showed possible nodule on xr and advised recheck.  Skin lesion;  Reports annual recurrence of lesion located on gluteal crest.  No pain, some itching, has been there for 2-3 weeks, improving.   Review of Systemssee HPI     Objective:   Physical Exam  Constitutional: He appears well-developed and well-nourished.  Cardiovascular: Normal rate, regular rhythm and normal heart sounds.   Pulmonary/Chest: Effort normal.       coarse breath sounds throughout  Skin:       Two small areas of scabbed lesions, healed near area at superior gluteal cleft.  No erythema induration, or drainage.          Assessment & Plan:

## 2011-02-06 NOTE — Assessment & Plan Note (Signed)
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  Gave him packets of triple abx ointment to prevent infection.  Pain not currently an issue.  Discussed infection control- to prevent transmission.  Patient not a good candidate to prescribe medications to for future use so I asked him to return on the first day when this recurs and we can discuss treatment to shorten course.

## 2011-02-14 NOTE — Cardiovascular Report (Signed)
NAMEARMONDO, CECH                 ACCOUNT NO.:  [id number]   MEDICAL RECORD NO.:  [id number]          PATIENT TYPE:  INP   LOCATION:  3734                         FACILITY:  [facility]   PHYSICIAN:  Darlin Priestly, MD  DATE OF BIRTH:  1951-05-06   DATE OF PROCEDURE:  DATE OF DISCHARGE:  07/03/2004                              CARDIAC CATHETERIZATION   PROCEDURES:  1.  Left heart catheterization.  2.  Coronary angiography.  3.  Left ventriculogram.  4.  Bilateral renal angiogram.   ATTENDING:  Darlin Priestly, MD   COMPLICATIONS:  None.   INDICATIONS:  Mr. Lia is a 60 year old male patient of Dr. Renaye Rakers and  Dr. Yates Decamp.  He initially presented to Urgent Care with a complaint of  chest pain and subsequently transferred to the emergency room at [facility].  His chest pain was nitroglycerin responsive.  He was noted to  have a history of hypertension and tobacco use as well as bipolar disorder.  He subsequently ruled out for an MI; however, given his risk factors, he is  now referred for cardiac catheterization to rule out significant CAD.   DESCRIPTION OF PROCEDURE:  After informed written consent, the patient was  brought to the cardiac catheterization laboratory where his right groin was  shaved, prepped and draped in the usual sterile fashion.  ECG monitoring  established.  Using a modified Seldinger technique, a #6 intraarterial  sheath inserted in the right femoral artery.  A 6-French diagnostic catheter  was inserted and performed diagnostic angiography.   The left main is a large vessel with no evidence of disease.   The LAD medium sized vessel that gives rise to one large septal perforator.  The LAD has mild 40% mid vessel narrowing.   The left coronary artery has a medium sized ramus intermedius bifurcated  distally.  The ramus has no evidence of disease.   The left circumflex medium sized vessel that gives rise to and that goes to  two obtuse  marginal branches.  The AV circumflex is noted to have mild 30%  narrowing in its midportion.   The first __OM______ is a small vessel with no evidence of disease.   The second ____OM____ in mid segment with no evidence of disease.   The right coronary artery medium sized vessel that is dominant and gives  rise to a PDS as well as posterolateral branch.  The RCA is noted with mild  40% proximal and 20% distal stenosis.  There is no further significant  disease in the RCA, PDA, or posterolateral branch.   The left ventriculogram reveals preserved EF at 50%.   Bilateral renal angiogram reveals no evidence of significant renal artery  stenosis.   HEMODYNAMICS:  Systemic arterial pressure 170/91, LV systemic pressure of  167/6, LVEDP of 14.   CONCLUSION:  1.  Noncritical CAD.  2.  Normal LV systolic function.  3.  No evidence of renal artery stenosis.  4.  Systemic hypertension.      Robe   RHM/MEDQ  D:  07/03/2004  T:  07/03/2004  Job:  [id number]   cc:   Renaye Rakers, M.D.  [phone]  [place]  [place] [address]  Fax: [phone]   Cristy Hilts. Jacinto Halim, MD  [address]  [place]  [place] [address]  Fax: [phone]

## 2011-02-14 NOTE — Discharge Summary (Signed)
[facility]  Patient:    Jaime Barnes, Jaime Barnes Visit Number: [id number] MRN: [id number]          Service Type: MED Location: 2T [id number] 22 Attending Physician:  Donnetta Hutching Dictated by:   Jerl Santos, M.D. Admit Date:  03/15/2002 Disc. Date: 03/17/02   CC:         [facility]   Discharge Summary  HISTORY OF PRESENT ILLNESS AND HOSPITAL COURSE:  The patient is a 60 year old man who had presented to the emergency room at [facility] on March 15, 2002 with a 3-day history of epigastric pain associated with nausea and vomiting.  He reported that vomitus intermittently contained coffee-grounds material.  There seemed to be no change in bowel function.  His past history was remarkable for diagnosis of pancreatitis and a history of peptic ulcer disease in the past.  Physical exam at the time of admission as described by Dr. Jamie Brookes was remarkable for epigastric pain on abdominal exam with normal bowel sounds.  Relevant laboratory studies were notable for a potassium of 2.9, amylase was 98, white count was 12,000, lipase was 21, BMET was otherwise normal, liver functions were normal.  A Helicobacter serology was obtained and is pending. A KUB and upright abdomen were normal.  The patient was given vigorous IV fluid as well as potassium 20 mEq/L and Protonix 40 mg every 12 hours was begun.  By March 17, 2002, the patient was much better.  It should be mentioned that during the course of his hospitalization he developed hiccups which responded to Thorazine given parenterally.  Therefore, on March 17, 2002, decision was made to discharge the patient.  DISCHARGE DIAGNOSES: 1. Gastritis. 2. Recurrent vomiting. 3. Bipolar disorder. 4. Possible history of hypertension.  DISCHARGE MEDICATIONS:  The patient is unclear about his medicines that he is receiving from the mental health clinic.  He was advised to continue these. He will also be given  Protonix 40 mg b.i.d. x7 days then 40 mg daily.  FOLLOWUP:  I indicated to him that we would follow up on his status at the office in 10-14 days. Dictated by:   Jerl Santos, M.D. Attending Physician:  Donnetta Hutching DD:  03/17/02 TD:  03/18/02 Job: [id number]

## 2011-02-14 NOTE — Op Note (Signed)
[place]  Patient:    Jaime Barnes, Jaime Barnes Visit Number: [id number] MRN: [id number]          Service Type: DSU Location: [facility] Attending Physician:  Marlowe Shores Dictated by:   Artist Pais Mina Marble, M.D. Proc. Date: 09/08/01 Admit Date:  09/08/2001                             Operative Report  PREOPERATIVE DIAGNOSES: 1. Retained hardware of right hand. 2. Left knee pain. 3. Volar synovitis.  POSTOPERATIVE DIAGNOSES: 1. Retained hardware of right hand. 2. Left knee pain. 3. Volar synovitis.  PROCEDURES: 1. Flexor synovectomy of wrist volarly. 2. Removal of retained hardware, right hand. 3. Injection of left knee.  SURGEON:  Artist Pais. Mina Marble, M.D.  ASSISTANT:  None.  ANESTHESIA:  General.  TOURNIQUET TIME:  An hour.  COMPLICATIONS:  None.  DRAINS:  None.  SPECIMENS:  One specimen sent.  DESCRIPTION OF PROCEDURE:  The patient was taken to the operating room.  After the induction of adequate general anesthesia, the right upper extremity and left knee were prepped and draped in the usual sterile fashion.  The left knee was injected with 10 cc of a solution of 9 cc of 0.25% plain Marcaine and 1 cc of 40 mg/ml of Kenalog.  After this was done, a Band-Aid was placed over the sterile injection site.  The right hand and arm were then prepped and draped in the usual sterile fashion.  Esmarch was used to exsanguinate the limb.  The tourniquet was inflated to 250 mmHg.  At this point in time, a 1 cm incision was made over the ulnar aspect of the fifth digit proximally where a previously placed IM rod from metacarpal fracture had been placed.  This was incised down through the skin and subcutaneous tissues.  The rod was identified and removed using pliers.  The wound was irrigated and closed with 4-0 nylon and three sutures.  At this point in time, the hand was fully supinated and a large chevron incision was made over the volar  aspect of the wrist and forearm distally on the right.  A chevron-type incision was made and an ulnar based flap was elevated.  The radial artery was identified proximal and distal to a large mass which appeared to be consistent with probable ganglion cyst for fluctuance synovitis.  This was carefully dissected off of the artery proximally and distally and removed in its entirety.  The flexor tendons were then excised of excessive synovium.  This was all sent for pathologic confirmation.  The wound was then thoroughly irrigated and this was closed with a running 4-0 Monocryl subcuticular stitch.  Steri-Strips, 4 x 4s, fluffs, and a volar splint were applied.  The patient tolerated the procedure well and went to recovery in stable condition. Dictated by:   Artist Pais Mina Marble, M.D. Attending Physician:  Marlowe Shores DD:  09/08/01 TD:  09/08/01 Job: [id number]

## 2011-02-14 NOTE — H&P (Signed)
Jaime Barnes, Jaime Barnes                 ACCOUNT NO.:  [id number]   MEDICAL RECORD NO.:  [id number]          PATIENT TYPE:  INP   LOCATION:  3734                         FACILITY:  [facility]   PHYSICIAN:  Vonna Kotyk R. Jacinto Halim, M.D.     DATE OF BIRTH:  1951-03-16   DATE OF ADMISSION:  07/02/2004  DATE OF DISCHARGE:                                HISTORY & PHYSICAL   CHIEF COMPLAINT:  Chest pain.   HISTORY OF PRESENT ILLNESS:  The patient is a 60 year old male with no prior  history of coronary disease or MI who is admitted to the emergency room via  urgent care with a history of chest pain.  The patient says he developed  left sided chest pain last night.  He described it as a dull ache.  He does  have some radiation to his left shoulder.  There is some associated  shortness of breath and nausea.  He has had symptoms off and on all night.  He presented to urgent care and his symptoms were relieved with  nitroglycerin.  He is sent to [facility] ER for further evaluation.  We were  asked to see him by Dr. Parke Simmers.   PAST MEDICAL HISTORY:  1.  Hypertension, although he is not treated with antihypertensives now.  2.  He has a remote history of peptic ulcer disease.  3.  He has had prior medication induced pancreatitis and possibly a mild      cirrhosis from medications in the past.  4.  He does have a history of migraines and he takes Imitrex on a p.r.n.      basis.  5.  He has a history of bipolar disorder.   PREVIOUS SURGERIES:  Remote tonsillectomy.   CURRENT MEDICATIONS:  Imitrex, Zyprexa, and Nexium, and Vytorin.   ALLERGIES:  1.  PENICILLIN.  2.  CODEINE.   SOCIAL HISTORY:  He is married.  He is a one pack a day smoker.  Denies any  drug use.  He has not had alcohol for many years.  He works as a Advertising account planner.   FAMILY HISTORY:  Unremarkable for coronary disease.  His mother is alive at  43.  His father is deceased.   REVIEW OF SYSTEMS:  Essentially unremarkable except for noted above.   There  was mild cirrhosis by a CAT scan of his abdomen in 2003.  He has no history  of prostate trouble or trouble voiding.  He has no history of kidney  problems.  He has had a prior ganglion cyst on the right that was operated  on.   PHYSICAL EXAMINATION:  VITAL SIGNS:  Blood pressure 170/92, pulse 66,  respirations 16.  GENERAL:  He is a well developed well nourished male in no acute distress.  HEENT:  Normocephalic.  Extraocular movements are intact.  Sclerae is  nonicteric.  Lids and conjunctivae are within normal limits.  NECK:  Without bruit.  No JVD.  CHEST:  Clear to auscultation percussion.  CARDIAC:  Reveals regular rate and rhythm without murmur, rub or gallop.  Normal  S1 S2.  ABDOMEN:  Nontender.  No hepatosplenomegaly.  EXTREMITIES:  Without edema.  Distal pulses are 2+/4.  There are no bruits  noted.  NEURO:  Grossly intact.   EKG shows a sinus rhythm with nonspecific ST changes and LVH by voltage.   LAB:  Troponin is negative x 2.  His creatinine is 1.5.   IMPRESSION:  1.  Unstable angina.  2.  Hypertension.  3.  Abnormal electrocardiogram.  4.  History of smoking.  5.  Peptic ulcer disease with a prior history of medication induced      pancreatitis.  6.  History of migraines.  7.  History of bipolar disorder.  8.  Penicillin allergy.  9.  History of possible cirrhosis.  10. Mild renal insufficiency.   PLAN:  He will be admitted to telemetry, started on aspirin, heparin, and  nitrates.  He may need diagnostic catheterization.      Fernand Parkins  D:  07/03/2004  T:  07/03/2004  Job:  [id number]   cc:   Cristy Hilts. Jacinto Halim, MD  [address]  [place]  [place] [address]  Fax: [phone]

## 2011-02-14 NOTE — Discharge Summary (Signed)
NAMEELWARD, NOCERA                 ACCOUNT NO.:  [id number]   MEDICAL RECORD NO.:  [id number]          PATIENT TYPE:  INP   LOCATION:  3734                         FACILITY:  [facility]   PHYSICIAN:  Vonna Kotyk R. Jacinto Halim, M.D.     DATE OF BIRTH:  Jan 27, 1951   DATE OF ADMISSION:  07/02/2004  DATE OF DISCHARGE:  07/03/2004                                 DISCHARGE SUMMARY   DISCHARGE DIAGNOSIS:  1.  Chest pain consistent with unstable angina, nonobstructive coronary      artery disease by catheterization this admission.  2.  Hypertension.  3.  Abnormal electrocardiogram.  4.  History of smoking.  5.  Past history of peptic ulcer disease and medication induced      pancreatitis.  6.  History of migraines.  7.  History of bipolar disorder.  8.  History of penicillin allergy.  9.  Mild cirrhosis by CT in the past.  10. Renal insufficiency with a creatinine of 1.5 on admission.   HOSPITAL COURSE:  The patient is a 60 year old male with no prior history of  coronary artery disease who presented to the emergency room  as a transfer  from Urgent Care.  He presented with chest pain consistent with unstable  angina, received nitroglycerin with relief.  EKG had some nonspecific ST  changes.  His enzymes were negative.  He was put on IV heparin and nitrates  and ruled out for an MI.  We did hydrate him overnight as his admission  creatinine was 1.5.  He also received Mucomyst.  We felt he was stable for  diagnostic catheterization July 03, 2004, and this was done by Dr. Jenne Campus  and revealed 40% LAD narrowing, 40% RCA, 30% circumflex with EF 50%.  There  was no renal artery stenosis.  His creatinine today is 1.4.  He does have a  history of a question of cirrhosis by CT scan in the past.  We had wanted to  get an x-ray and possibly an abdominal ultrasound but his LFTs were normal.  We feel this can be followed up by Dr. Parke Simmers.  He will need a chest x-ray at  some point.  We feel he can be discharged  later on October 5.  We will add  Norvasc for hypertension.   DISCHARGE MEDICATIONS:  Aspirin 81 mg daily, Zyprexa 10 mg daily, Norvasc 10  mg daily, Nexium 40 mg daily, and Vytorin as taken at home, 10/20 daily.   LABORATORY DATA:  Lipid profile pending at the time of dictation.  Liver  function tests normal.  INR 1.1.  Troponins negative x 2.  White count 10,  hemoglobin 12.9, hematocrit 37.2, platelets 173.  Sodium 141, potassium 3.8,  BUN 14, creatinine 1.4.   DISPOSITION:  The patient is discharged in stable condition.  He will follow  up with Dr. Jacinto Halim at the end of October.  He has been instructed to contact  Dr. Tedra Senegal office  to follow up with Dr. Parke Simmers in a couple of weeks.      Fernand Parkins  D:  07/03/2004  T:  07/03/2004  Job:  [id number]   cc:   Renaye Rakers, M.D.  [phone] [address]  [place]  [place] [address]  Fax: [phone]

## 2011-02-21 ENCOUNTER — Telehealth: Payer: Self-pay | Admitting: Family Medicine

## 2011-02-21 NOTE — Telephone Encounter (Signed)
Pt asking to speak with RN about blood work his mental health MD suggested he have done.

## 2011-02-25 NOTE — Telephone Encounter (Signed)
We do not have any standing order from outside mental health providers.  Patient has had labwork with me in Feb 2012.  Please find out details such as who requested labwork, what was requested.   We don't typically perform any bloodwork unless it is something we would normally draw such as CBC or CMET.  Thanks

## 2011-02-26 NOTE — Telephone Encounter (Signed)
Called and spoke with pt about lab orders and informed him that since we are not the physician that is requesting the blood work that he will need to got to an outside lab to have this done.  I spoke with Dr. Earnest Bailey concerning this and she is willing to send the results of his last labs to his mental health provider. I asked pt if he could give me the physicians name and number of  the clinic and pt stated that he does not read well and was not able to do so. Told pt that he can bring in orders so that we can look at them to help him out in this matter.Laureen Ochs, Viann Shove

## 2011-03-10 ENCOUNTER — Other Ambulatory Visit: Payer: Self-pay | Admitting: Family Medicine

## 2011-03-10 MED ORDER — LISINOPRIL-HYDROCHLOROTHIAZIDE 20-12.5 MG PO TABS: 1.0000 | ORAL_TABLET | Freq: Every day | ORAL | Status: DC

## 2011-03-10 NOTE — Telephone Encounter (Signed)
Changed benazepril to lisnopril due to backordered.  Will have him follow-up in 2 weeks for bp recheck and blood draw

## 2011-04-21 ENCOUNTER — Telehealth: Payer: Self-pay | Admitting: Family Medicine

## 2011-04-21 NOTE — Telephone Encounter (Signed)
Needs a referral for [facility] - [phone].  When he called to make an appt. He was told that he needed a referral and that his PCP office would need to make the appt.  Would like a call when this is done ane said he needs it as soon as possible.

## 2011-04-22 ENCOUNTER — Other Ambulatory Visit: Payer: Self-pay | Admitting: Family Medicine

## 2011-04-22 DIAGNOSIS — N5319 Other ejaculatory dysfunction: Secondary | ICD-10-CM

## 2011-04-22 NOTE — Telephone Encounter (Signed)
Patient has been seen by urologist Dr. Harriett Sine at [facility] many years ago for impotence. Was told by the office he needs a new referral. Please call [facility] at number below as I will make the order now. Patient needs evaluation for problems ejaculating. Thanks.

## 2011-04-22 NOTE — Assessment & Plan Note (Signed)
Patient was treated at [facility] previously for impotence, now having difficulty ejaculating for past 3 months. Has some  associated abdominal pain. No difficulty urinating or fevers, bleeding, discharge. Will make referral for urologic evaluation.

## 2011-04-24 NOTE — Telephone Encounter (Signed)
Pt has appt for 8.29.2012 @ 930 am with [facility] [address] [phone].  Pt informed and asked to call their office 24 hours in advance to reschedule or cancel pt agreed.Laureen Ochs, Viann Shove

## 2011-05-05 ENCOUNTER — Other Ambulatory Visit: Payer: Self-pay | Admitting: Family Medicine

## 2011-05-05 NOTE — Telephone Encounter (Signed)
Refill request

## 2011-05-22 ENCOUNTER — Encounter: Payer: Medicaid Other | Admitting: Family Medicine

## 2011-05-26 ENCOUNTER — Telehealth: Payer: Self-pay | Admitting: Family Medicine

## 2011-05-26 DIAGNOSIS — K2270 Barrett's esophagus without dysplasia: Secondary | ICD-10-CM

## 2011-05-26 DIAGNOSIS — I10 Essential (primary) hypertension: Secondary | ICD-10-CM

## 2011-05-26 MED ORDER — SIMVASTATIN 20 MG PO TABS: 20.0000 mg | ORAL_TABLET | Freq: Every day | ORAL | Status: DC

## 2011-05-26 MED ORDER — OMEPRAZOLE 20 MG PO CPDR: 20.0000 mg | DELAYED_RELEASE_CAPSULE | Freq: Every day | ORAL | Status: DC

## 2011-05-26 MED ORDER — VERAPAMIL HCL 120 MG PO TABS: 120.0000 mg | ORAL_TABLET | Freq: Three times a day (TID) | ORAL | Status: DC

## 2011-05-26 NOTE — Telephone Encounter (Signed)
Called to discuss med refill requests. Left a message. I am unsure which medications he is still taking and requested he schedule an office visit for blood pressure follow up. Need to clarify if he is taking lisinopril-HCTZ still. Last note says patient did not know.

## 2011-06-04 ENCOUNTER — Other Ambulatory Visit: Payer: Self-pay | Admitting: Family Medicine

## 2011-06-04 MED ORDER — LISINOPRIL-HYDROCHLOROTHIAZIDE 20-12.5 MG PO TABS: 1.0000 | ORAL_TABLET | Freq: Every day | ORAL | Status: DC

## 2011-06-09 ENCOUNTER — Ambulatory Visit (INDEPENDENT_AMBULATORY_CARE_PROVIDER_SITE_OTHER): Payer: Medicaid Other | Admitting: Family Medicine

## 2011-06-09 ENCOUNTER — Encounter: Payer: Self-pay | Admitting: Family Medicine

## 2011-06-09 VITALS — BP 137/88 | HR 63 | Temp 98.2°F | Ht 66.0 in | Wt 185.1 lb

## 2011-06-09 DIAGNOSIS — R9389 Abnormal findings on diagnostic imaging of other specified body structures: Secondary | ICD-10-CM

## 2011-06-09 DIAGNOSIS — Z23 Encounter for immunization: Secondary | ICD-10-CM

## 2011-06-09 DIAGNOSIS — K2270 Barrett's esophagus without dysplasia: Secondary | ICD-10-CM

## 2011-06-09 DIAGNOSIS — R918 Other nonspecific abnormal finding of lung field: Secondary | ICD-10-CM

## 2011-06-09 DIAGNOSIS — R059 Cough, unspecified: Secondary | ICD-10-CM

## 2011-06-09 DIAGNOSIS — R05 Cough: Secondary | ICD-10-CM

## 2011-06-09 DIAGNOSIS — E785 Hyperlipidemia, unspecified: Secondary | ICD-10-CM

## 2011-06-09 DIAGNOSIS — I10 Essential (primary) hypertension: Secondary | ICD-10-CM

## 2011-06-09 DIAGNOSIS — F319 Bipolar disorder, unspecified: Secondary | ICD-10-CM

## 2011-06-09 DIAGNOSIS — R079 Chest pain, unspecified: Secondary | ICD-10-CM

## 2011-06-09 DIAGNOSIS — F17200 Nicotine dependence, unspecified, uncomplicated: Secondary | ICD-10-CM

## 2011-06-09 LAB — CBC
HCT: 38.1 % — ABNORMAL LOW (ref 39.0–52.0)
Hemoglobin: 12.8 g/dL — ABNORMAL LOW (ref 13.0–17.0)
MCH: 29.7 pg (ref 26.0–34.0)
MCHC: 33.6 g/dL (ref 30.0–36.0)
RDW: 13.1 % (ref 11.5–15.5)

## 2011-06-09 LAB — BASIC METABOLIC PANEL
CO2: 26 mEq/L (ref 19–32)
Chloride: 105 mEq/L (ref 96–112)
Glucose, Bld: 71 mg/dL (ref 70–99)
Sodium: 143 mEq/L (ref 135–145)

## 2011-06-09 MED ORDER — LISINOPRIL-HYDROCHLOROTHIAZIDE 20-12.5 MG PO TABS: 1.0000 | ORAL_TABLET | Freq: Every day | ORAL | Status: DC

## 2011-06-09 MED ORDER — TETANUS-DIPHTH-ACELL PERTUSSIS 5-2.5-18.5 LF-MCG/0.5 IM SUSP: 0.5000 mL | Freq: Once | INTRAMUSCULAR | Status: DC

## 2011-06-09 MED ORDER — VERAPAMIL HCL 120 MG PO TABS: 120.0000 mg | ORAL_TABLET | Freq: Three times a day (TID) | ORAL | Status: DC

## 2011-06-09 MED ORDER — ALBUTEROL 90 MCG/ACT IN AERS: 2.0000 | INHALATION_SPRAY | Freq: Four times a day (QID) | RESPIRATORY_TRACT | Status: DC | PRN

## 2011-06-09 NOTE — Patient Instructions (Signed)
Nice to meet you. Get your chest x ray done as soon as you can.  I will call you if your labs are not normal. Will set you up for a stress test to evaluate your heart. Sent your refills to [facility]. Make appointment in 6 months for check up. Great job on stopping smoking!! Good plan.

## 2011-06-10 ENCOUNTER — Telehealth: Payer: Self-pay | Admitting: Family Medicine

## 2011-06-10 DIAGNOSIS — E785 Hyperlipidemia, unspecified: Secondary | ICD-10-CM

## 2011-06-10 NOTE — Telephone Encounter (Signed)
Discussed increased d-LDL (increased from 50-->141). Patient will come in for FLP when fasting at his convenience before we make medication adjustments, as he has been stable with LDL <70 in the past. Continue simvastatin 20mg  daily, but will need to treat for goal <130 as his 10-yr risk is 18%. Also patient will have follow up CT to monitor nodules.

## 2011-06-10 NOTE — Assessment & Plan Note (Signed)
Last LDL above goal <130. Will check today and consider increase dose of statin in this patient with significant CAD risk factors (smoking, age, HLD, HTN).

## 2011-06-10 NOTE — Progress Notes (Signed)
  Subjective:    Patient ID: Jaime Barnes, male    DOB: 1951-09-18, 60 y.o.   MRN: [id number]  HPI  1. HTN. Does not check BP at home. Taking medications as prescribed and needs refills today. Denies any edema, visual changes, dyspnea, syncope.  2. Bipolar DO. Follows at Mental Health clinic regularly. Stable on trazadone and topamax currently, but having issues with impotence and plans to discuss discontinuation of trazodone with psychiatrist at appt tomorrow. Denies overwhelming depression or mania currently.   3. Tobacco abuse. Still smokes about 6 cigarettes daily. Has a planned quit date (his birthday next month). Planning to quit simultaneously with his significant other. Motivation is knowledge of risks and some recent coughing.   4. Pulmonary nodule. Found on CT scan in 01/2010. Recommended follow up CT in one year and hasn't undergone yet. Has an intermittent cough. No weight loss, hemoptysis, sweats, fevers, chest pain.   Review of Systems See HPI otherwise negative.     Objective:   Physical Exam  Vitals reviewed. Constitutional: He is oriented to person, place, and time. He appears well-developed and well-nourished. No distress.  HENT:  Head: Normocephalic and atraumatic.  Eyes: EOM are normal. Pupils are equal, round, and reactive to light.  Cardiovascular: Normal rate, regular rhythm, normal heart sounds and intact distal pulses.   No murmur heard. Pulmonary/Chest: Effort normal. No respiratory distress. He has no wheezes.       Coarse breath sounds throughout and faint basilar crackles.  Musculoskeletal: Normal range of motion. He exhibits no edema and no tenderness.  Neurological: He is alert and oriented to person, place, and time. No cranial nerve deficit. He exhibits normal muscle tone. Coordination normal.  Skin: Skin is warm. He is not diaphoretic.  Psychiatric: He has a normal mood and affect.          Assessment & Plan:

## 2011-06-10 NOTE — Assessment & Plan Note (Signed)
Stable currently. Patient to continue current medications and f/u with primary psychiatrist to discuss discontinuation of trazadone.

## 2011-06-10 NOTE — Assessment & Plan Note (Signed)
Plans to quit smoking next month. Seems to have a good plan and supportive partner. Will attempt nicotine patches on quit date.

## 2011-06-10 NOTE — Assessment & Plan Note (Signed)
CT scan showing pulmonary nodule one year ago. Will repeat dedicated chest CT to follow any growth. High risk with history of smoking and abnormal lung exam.

## 2011-06-10 NOTE — Assessment & Plan Note (Signed)
Likely was MSK as pain was relieved with NSAIDs previously. No exertional symptoms currently. Negative cardiac cath in 2005. Would pursuit stress testing if patient had any typical or exertional symptoms in the future.

## 2011-06-10 NOTE — Assessment & Plan Note (Signed)
At goal currently. No medication changes. Will check labs at next visit in 6 months.

## 2011-07-04 ENCOUNTER — Other Ambulatory Visit: Payer: Self-pay | Admitting: Family Medicine

## 2011-07-04 DIAGNOSIS — K2270 Barrett's esophagus without dysplasia: Secondary | ICD-10-CM

## 2011-07-04 MED ORDER — LISINOPRIL-HYDROCHLOROTHIAZIDE 20-12.5 MG PO TABS: 1.0000 | ORAL_TABLET | Freq: Every day | ORAL | Status: DC

## 2011-07-04 MED ORDER — OMEPRAZOLE 20 MG PO CPDR: 20.0000 mg | DELAYED_RELEASE_CAPSULE | Freq: Every day | ORAL | Status: DC

## 2011-08-05 ENCOUNTER — Other Ambulatory Visit: Payer: Self-pay | Admitting: Family Medicine

## 2011-08-05 DIAGNOSIS — K2270 Barrett's esophagus without dysplasia: Secondary | ICD-10-CM

## 2011-08-05 DIAGNOSIS — I10 Essential (primary) hypertension: Secondary | ICD-10-CM

## 2011-08-05 MED ORDER — VERAPAMIL HCL 120 MG PO TABS: 120.0000 mg | ORAL_TABLET | Freq: Three times a day (TID) | ORAL | Status: DC

## 2011-08-05 MED ORDER — SIMVASTATIN 20 MG PO TABS: 20.0000 mg | ORAL_TABLET | Freq: Every day | ORAL | Status: DC

## 2011-08-05 MED ORDER — OMEPRAZOLE 20 MG PO CPDR: 20.0000 mg | DELAYED_RELEASE_CAPSULE | Freq: Every day | ORAL | Status: DC

## 2011-08-19 ENCOUNTER — Ambulatory Visit: Payer: Medicaid Other | Admitting: Family Medicine

## 2011-08-19 ENCOUNTER — Other Ambulatory Visit: Payer: Medicaid Other

## 2011-09-08 ENCOUNTER — Ambulatory Visit: Payer: Medicaid Other | Admitting: Family Medicine

## 2011-09-24 ENCOUNTER — Ambulatory Visit: Payer: Medicaid Other | Admitting: Family Medicine

## 2011-10-16 ENCOUNTER — Ambulatory Visit: Payer: Medicaid Other | Admitting: Family Medicine

## 2011-11-04 ENCOUNTER — Other Ambulatory Visit: Payer: Self-pay | Admitting: Family Medicine

## 2011-11-04 NOTE — Telephone Encounter (Signed)
Refill request

## 2011-11-05 ENCOUNTER — Encounter: Payer: Self-pay | Admitting: Family Medicine

## 2011-11-27 ENCOUNTER — Ambulatory Visit (INDEPENDENT_AMBULATORY_CARE_PROVIDER_SITE_OTHER): Payer: Medicaid Other | Admitting: Family Medicine

## 2011-11-27 ENCOUNTER — Encounter: Payer: Self-pay | Admitting: Family Medicine

## 2011-11-27 ENCOUNTER — Other Ambulatory Visit: Payer: Self-pay | Admitting: Family Medicine

## 2011-11-27 VITALS — BP 142/91 | HR 76 | Temp 97.6°F | Ht 66.0 in | Wt 196.4 lb

## 2011-11-27 DIAGNOSIS — R9389 Abnormal findings on diagnostic imaging of other specified body structures: Secondary | ICD-10-CM

## 2011-11-27 DIAGNOSIS — N644 Mastodynia: Secondary | ICD-10-CM

## 2011-11-27 DIAGNOSIS — E785 Hyperlipidemia, unspecified: Secondary | ICD-10-CM

## 2011-11-27 DIAGNOSIS — R059 Cough, unspecified: Secondary | ICD-10-CM

## 2011-11-27 DIAGNOSIS — F17200 Nicotine dependence, unspecified, uncomplicated: Secondary | ICD-10-CM

## 2011-11-27 DIAGNOSIS — R05 Cough: Secondary | ICD-10-CM

## 2011-11-27 DIAGNOSIS — R918 Other nonspecific abnormal finding of lung field: Secondary | ICD-10-CM

## 2011-11-27 DIAGNOSIS — K7030 Alcoholic cirrhosis of liver without ascites: Secondary | ICD-10-CM

## 2011-11-27 DIAGNOSIS — F319 Bipolar disorder, unspecified: Secondary | ICD-10-CM

## 2011-11-27 DIAGNOSIS — I10 Essential (primary) hypertension: Secondary | ICD-10-CM

## 2011-11-27 LAB — COMPREHENSIVE METABOLIC PANEL
ALT: 22 U/L (ref 0–53)
AST: 23 U/L (ref 0–37)
Alkaline Phosphatase: 86 U/L (ref 39–117)
CO2: 25 mEq/L (ref 19–32)
Creat: 1.23 mg/dL (ref 0.50–1.35)
Sodium: 140 mEq/L (ref 135–145)
Total Bilirubin: 0.3 mg/dL (ref 0.3–1.2)
Total Protein: 6.8 g/dL (ref 6.0–8.3)

## 2011-11-27 LAB — CBC
HCT: 37.1 % — ABNORMAL LOW (ref 39.0–52.0)
MCH: 29.4 pg (ref 26.0–34.0)
MCHC: 33.2 g/dL (ref 30.0–36.0)
MCV: 88.5 fL (ref 78.0–100.0)
Platelets: 174 10*3/uL (ref 150–400)
RDW: 14.1 % (ref 11.5–15.5)

## 2011-11-27 LAB — LIPID PANEL
HDL: 26 mg/dL — ABNORMAL LOW (ref >39)
LDL Cholesterol: 38 mg/dL (ref 0–99)
Triglycerides: 205 mg/dL — ABNORMAL HIGH (ref <150)
VLDL: 41 mg/dL — ABNORMAL HIGH (ref 0–40)

## 2011-11-27 MED ORDER — NICOTINE 14 MG/24HR TD PT24: 1.0000 | MEDICATED_PATCH | TRANSDERMAL | Status: AC

## 2011-11-27 MED ORDER — OMEPRAZOLE 20 MG PO CPDR: 20.0000 mg | DELAYED_RELEASE_CAPSULE | Freq: Every day | ORAL | Status: DC

## 2011-11-27 MED ORDER — SIMVASTATIN 20 MG PO TABS: 20.0000 mg | ORAL_TABLET | Freq: Every day | ORAL | Status: DC

## 2011-11-27 MED ORDER — LISINOPRIL-HYDROCHLOROTHIAZIDE 20-12.5 MG PO TABS: 1.0000 | ORAL_TABLET | Freq: Every day | ORAL | Status: DC

## 2011-11-27 MED ORDER — VERAPAMIL HCL 120 MG PO TABS: 120.0000 mg | ORAL_TABLET | Freq: Three times a day (TID) | ORAL | Status: DC

## 2011-11-27 MED ORDER — ALBUTEROL SULFATE HFA 108 (90 BASE) MCG/ACT IN AERS: 2.0000 | INHALATION_SPRAY | Freq: Four times a day (QID) | RESPIRATORY_TRACT | Status: DC | PRN

## 2011-11-27 NOTE — Patient Instructions (Signed)
Will check labs today.  Restart blood pressure meds. Get CT scan today, will call you with results. 1-800-quitnow Will send nicotine patches.  Smoking Cessation, Tips for Success YOU CAN QUIT SMOKING If you are ready to quit smoking, congratulations! You have chosen to help yourself be healthier. Cigarettes bring nicotine, tar, carbon monoxide, and other irritants into your body. Your lungs, heart, and blood vessels will be able to work better without these poisons. There are many different ways to quit smoking. Nicotine gum, nicotine patches, a nicotine inhaler, or nicotine nasal spray can help with physical craving. Hypnosis, support groups, and medicines help break the habit of smoking. Here are some tips to help you quit for good.  Throw away all cigarettes.   Clean and remove all ashtrays from your home, work, and car.   On a card, write down your reasons for quitting. Carry the card with you and read it when you get the urge to smoke.   Cleanse your body of nicotine. Drink enough water and fluids to keep your urine clear or pale yellow. Do this after quitting to flush the nicotine from your body.   Learn to predict your moods. Do not let a bad situation be your excuse to have a cigarette. Some situations in your life might tempt you into wanting a cigarette.   Never have "just one" cigarette. It leads to wanting another and another. Remind yourself of your decision to quit.   Change habits associated with smoking. If you smoked while driving or when feeling stressed, try other activities to replace smoking. Stand up when drinking your coffee. Brush your teeth after eating. Sit in a different chair when you read the paper. Avoid alcohol while trying to quit, and try to drink fewer caffeinated beverages. Alcohol and caffeine may urge you to smoke.   Avoid foods and drinks that can trigger a desire to smoke, such as sugary or spicy foods and alcohol.   Ask people who smoke not to smoke  around you.   Have something planned to do right after eating or having a cup of coffee. Take a walk or exercise to perk you up. This will help to keep you from overeating.   Try a relaxation exercise to calm you down and decrease your stress. Remember, you may be tense and nervous for the first 2 weeks after you quit, but this will pass.   Find new activities to keep your hands busy. Play with a pen, coin, or rubber band. Doodle or draw things on paper.   Brush your teeth right after eating. This will help cut down on the craving for the taste of tobacco after meals. You can try mouthwash, too.   Use oral substitutes, such as lemon drops, carrots, a cinnamon stick, or chewing gum, in place of cigarettes. Keep them handy so they are available when you have the urge to smoke.   When you have the urge to smoke, try deep breathing.   Designate your home as a nonsmoking area.   If you are a heavy smoker, ask your caregiver about a prescription for nicotine chewing gum. It can ease your withdrawal from nicotine.   Reward yourself. Set aside the cigarette money you save and buy yourself something nice.   Look for support from others. Join a support group or smoking cessation program. Ask someone at home or at work to help you with your plan to quit smoking.   Always ask yourself, "Do I need this  cigarette or is this just a reflex?" Tell yourself, "Today, I choose not to smoke," or "I do not want to smoke." You are reminding yourself of your decision to quit, even if you do smoke a cigarette.  HOW WILL I FEEL WHEN I QUIT SMOKING?  The benefits of not smoking start within days of quitting.   You may have symptoms of withdrawal because your body is used to nicotine (the addictive substance in cigarettes). You may crave cigarettes, be irritable, feel very hungry, cough often, get headaches, or have difficulty concentrating.   The withdrawal symptoms are only temporary. They are strongest when you  first quit but will go away within 10 to 14 days.   When withdrawal symptoms occur, stay in control. Think about your reasons for quitting. Remind yourself that these are signs that your body is healing and getting used to being without cigarettes.   Remember that withdrawal symptoms are easier to treat than the major diseases that smoking can cause.   Even after the withdrawal is over, expect periodic urges to smoke. However, these cravings are generally short-lived and will go away whether you smoke or not. Do not smoke!   If you relapse and smoke again, do not lose hope. Most smokers quit 3 times before they are successful.   If you relapse, do not give up! Plan ahead and think about what you will do the next time you get the urge to smoke.  LIFE AS A NONSMOKER: MAKE IT FOR A MONTH, MAKE IT FOR LIFE Day 1: Hang this page where you will see it every day. Day 2: Get rid of all ashtrays, matches, and lighters. Day 3: Drink water. Breathe deeply between sips. Day 4: Avoid places with smoke-filled air, such as bars, clubs, or the smoking section of restaurants. Day 5: Keep track of how much money you save by not smoking. Day 6: Avoid boredom. Keep a good book with you or go to the movies. Day 7: Reward yourself! One week without smoking! Day 8: Make a dental appointment to get your teeth cleaned. Day 9: Decide how you will turn down a cigarette before it is offered to you. Day 10: Review your reasons for quitting. Day 11: Distract yourself. Stay active to keep your mind off smoking and to relieve tension. Take a walk, exercise, read a book, do a crossword puzzle, or try a new hobby. Day 12: Exercise. Get off the bus before your stop or use stairs instead of escalators. Day 13: Call on friends for support and encouragement. Day 14: Reward yourself! Two weeks without smoking! Day 15: Practice deep breathing exercises. Day 16: Bet a friend that you can stay a nonsmoker. Day 17: Ask to sit in  nonsmoking sections of restaurants. Day 18: Hang up "No Smoking" signs. Day 19: Think of yourself as a nonsmoker. Day 20: Each morning, tell yourself you will not smoke. Day 21: Reward yourself! Three weeks without smoking! Day 22: Think of smoking in negative ways. Remember how it stains your teeth, gives you bad breath, and leaves you short of breath. Day 23: Eat a nutritious breakfast. Day 24:Do not relive your days as a smoker. Day 25: Hold a pencil in your hand when talking on the telephone. Day 26: Tell all your friends you do not smoke. Day 27: Think about how much better food tastes. Day 28: Remember, one cigarette is one too many. Day 29: Take up a hobby that will keep your hands busy. Day  30: Congratulations! One month without smoking! Give yourself a big reward. Your caregiver can direct you to community resources or hospitals for support, which may include:  Group support.   Education.   Hypnosis.   Subliminal therapy.  Document Released: 06/13/2004 Document Revised: 05/28/2011 Document Reviewed: 07/02/2009 [facility] Patient Information 2012 [place], [place].

## 2011-11-28 ENCOUNTER — Encounter: Payer: Self-pay | Admitting: Family Medicine

## 2011-11-28 NOTE — Assessment & Plan Note (Signed)
Discussed cessation tactics. States he is ready, but has many attempts. Agree to try nicotine patches. His partner is girlfriend also trying to quit.

## 2011-11-28 NOTE — Assessment & Plan Note (Signed)
Above goal. Restart verapamil. Check labs today.

## 2011-11-28 NOTE — Progress Notes (Signed)
  Subjective:    Patient ID: Jaime Barnes, male    DOB: [birth year], 61 y.o.   MRN: [id number]  HPI  1. Breast pain. Located under the nipple for past month. Some mild swelling also. Denies lactorrhea or discharge of nipple. No fevers. 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 Has not been taking his CCB due to not getting refills.   2. Smoking. Down to 4 or 5 cigarettes daily. Desires to quit. Has not tried any nicotine replacement or other medications.   3. Pulmonary nodule. Noted on chest CT 01/2010 and again nodular areas on XRay 4/12. Have discussed repeat chest CT given history of smoking and high risk of lung cancer.  4. HTN. Taking acei/HCTZ. Ran out of verapamil. Denies any edema, chest pain, dyspnea. Due for labs.  Review of Systems See HPI otherwise negative.    Objective:   Physical Exam  Vitals reviewed. Constitutional: He is oriented to person, place, and time. He appears well-developed and well-nourished. No distress.  HENT:  Head: Normocephalic and atraumatic.  Eyes: EOM are normal.  Cardiovascular: Normal rate, regular rhythm, normal heart sounds and intact distal pulses.   Pulmonary/Chest: Effort normal.       Coarse breath sounds throughout. No wheezing or focal findings.  Bilateral gynecomastia. Left nipple with underlying fullness and tiny nodule palpated and tender. No skin changes or nipple discharge.   Abdominal: He exhibits distension. There is no tenderness.       Hepatic fullness.  Musculoskeletal: He exhibits no edema and no tenderness.  Neurological: He is alert and oriented to person, place, and time.  Skin: No rash noted.  Psychiatric: He has a normal mood and affect.       Assessment & Plan:

## 2011-11-28 NOTE — Assessment & Plan Note (Signed)
Due for FLP. Refilled simvastatin.

## 2011-11-28 NOTE — Assessment & Plan Note (Signed)
Seems stable and denies alcohol use currently. Will check labs today.

## 2011-11-28 NOTE — Assessment & Plan Note (Signed)
New onset pain and tiny nodule palpated. Has chronic reasons for gynecomastia (cirrhosis and marijuana), but will check mammogram to r/o malignancy.

## 2011-11-28 NOTE — Assessment & Plan Note (Signed)
Continues on trazadone and topamax per psych.

## 2011-11-28 NOTE — Assessment & Plan Note (Signed)
At risk for cancer with heavy smoking. Will f/u chest CT. Check labs prior to exam.

## 2011-12-04 ENCOUNTER — Ambulatory Visit
Admission: RE | Admit: 2011-12-04 | Discharge: 2011-12-04 | Disposition: A | Discharge status: Home / Self Care | Payer: Medicaid Other | Source: Ambulatory Visit | Attending: Family Medicine | Admitting: Family Medicine

## 2011-12-04 DIAGNOSIS — R9389 Abnormal findings on diagnostic imaging of other specified body structures: Secondary | ICD-10-CM

## 2011-12-04 DIAGNOSIS — N644 Mastodynia: Secondary | ICD-10-CM

## 2011-12-04 MED ORDER — IOHEXOL 300 MG/ML  SOLN
75.0000 mL | Freq: Once | INTRAMUSCULAR | Status: AC | PRN
  Administered 2011-12-04: 75 mL via INTRAVENOUS

## 2011-12-05 ENCOUNTER — Telehealth: Payer: Self-pay | Admitting: *Deleted

## 2011-12-05 NOTE — Telephone Encounter (Signed)
Called and informed pt's partner that his results are negative at present. Laureen Ochs, Viann Shove

## 2011-12-05 NOTE — Telephone Encounter (Signed)
Message copied by Deno Etienne on Fri Dec 05, 2011  9:46 AM ------      Message from: Durwin Reges      Created: Fri Dec 05, 2011  9:01 AM      Regarding: CT results       Please call and inform CT scan chest results are negative. No sign of caner currently.

## 2012-02-26 ENCOUNTER — Ambulatory Visit: Payer: Medicaid Other | Admitting: Family Medicine

## 2012-03-19 ENCOUNTER — Encounter: Payer: Self-pay | Admitting: Family Medicine

## 2012-03-19 ENCOUNTER — Encounter: Payer: Self-pay | Admitting: *Deleted

## 2012-03-19 ENCOUNTER — Ambulatory Visit (INDEPENDENT_AMBULATORY_CARE_PROVIDER_SITE_OTHER): Payer: Medicaid Other | Admitting: Family Medicine

## 2012-03-19 VITALS — BP 159/96 | HR 114 | Temp 98.4°F | Ht 66.0 in | Wt 181.0 lb

## 2012-03-19 DIAGNOSIS — A084 Viral intestinal infection, unspecified: Secondary | ICD-10-CM | POA: Insufficient documentation

## 2012-03-19 DIAGNOSIS — A088 Other specified intestinal infections: Secondary | ICD-10-CM

## 2012-03-19 MED ORDER — ONDANSETRON 4 MG PO TBDP
4.0000 mg | ORAL_TABLET | Freq: Once | ORAL | Status: AC
  Administered 2012-03-19: 4 mg via ORAL

## 2012-03-19 MED ORDER — ONDANSETRON HCL 4 MG PO TABS: 4.0000 mg | ORAL_TABLET | Freq: Three times a day (TID) | ORAL | Status: AC | PRN

## 2012-03-19 NOTE — Patient Instructions (Signed)
Viral Gastroenteritis Viral gastroenteritis is also known as stomach flu. This condition affects the stomach and intestinal tract. It can cause sudden diarrhea and vomiting. The illness typically lasts 3 to 8 days. Most people develop an immune response that eventually gets rid of the virus. While this natural response develops, the virus can make you quite ill. CAUSES  Many different viruses can cause gastroenteritis, such as rotavirus or noroviruses. You can catch one of these viruses by consuming contaminated food or water. You may also catch a virus by sharing utensils or other personal items with an infected person or by touching a contaminated surface. SYMPTOMS  The most common symptoms are diarrhea and vomiting. These problems can cause a severe loss of body fluids (dehydration) and a body salt (electrolyte) imbalance. Other symptoms may include:  Fever.   Headache.   Fatigue.   Abdominal pain.  DIAGNOSIS  Your caregiver can usually diagnose viral gastroenteritis based on your symptoms and a physical exam. A stool sample may also be taken to test for the presence of viruses or other infections. TREATMENT  This illness typically goes away on its own. Treatments are aimed at rehydration. The most serious cases of viral gastroenteritis involve vomiting so severely that you are not able to keep fluids down. In these cases, fluids must be given through an intravenous line (IV). HOME CARE INSTRUCTIONS   Drink enough fluids to keep your urine clear or pale yellow. Drink small amounts of fluids frequently and increase the amounts as tolerated.   Ask your caregiver for specific rehydration instructions.   Avoid:   Foods high in sugar.   Alcohol.   Carbonated drinks.   Tobacco.   Juice.   Caffeine drinks.   Extremely hot or cold fluids.   Fatty, greasy foods.   Too much intake of anything at one time.   Dairy products until 24 to 48 hours after diarrhea stops.   You may  consume probiotics. Probiotics are active cultures of beneficial bacteria. They may lessen the amount and number of diarrheal stools in adults. Probiotics can be found in yogurt with active cultures and in supplements.   Wash your hands well to avoid spreading the virus.   Only take over-the-counter or prescription medicines for pain, discomfort, or fever as directed by your caregiver. Do not give aspirin to children. Antidiarrheal medicines are not recommended.   Ask your caregiver if you should continue to take your regular prescribed and over-the-counter medicines.   Keep all follow-up appointments as directed by your caregiver.  SEEK IMMEDIATE MEDICAL CARE IF:   You are unable to keep fluids down.   You do not urinate at least once every 6 to 8 hours.   You develop shortness of breath.   You notice blood in your stool or vomit. This may look like coffee grounds.   You have abdominal pain that increases or is concentrated in one small area (localized).   You have persistent vomiting or diarrhea.   You have a fever.   The patient is a child younger than 3 months, and he or she has a fever.   The patient is a child older than 3 months, and he or she has a fever and persistent symptoms.   The patient is a child older than 3 months, and he or she has a fever and symptoms suddenly get worse.   The patient is a baby, and he or she has no tears when crying.  MAKE SURE YOU:     Understand these instructions.   Will watch your condition.   Will get help right away if you are not doing well or get worse.  Document Released: 09/15/2005 Document Revised: 09/04/2011 Document Reviewed: 07/02/2011 Exit[employer] Patient Information 2012 [employer]. 

## 2012-03-19 NOTE — Progress Notes (Signed)
  Subjective:    Patient ID: Jaime Barnes, male    DOB: [birth year], 61 y.o.   MRN: [id number]  HPI Nausea, vomiting, diarrhea x 2 days.  Had sudden onset of sxs yesterday.  4-5 episodes of NBNB vomiting over past 24 hours.  + watery diarrhea.  Has been able to hold down some liquids.  + fevers and chills at home.  Likely sick contacts at work.  + mild diffuse abdominal pain.    Review of Systems See HPI, otherwise ROS negative     Objective:   Physical Exam Gen: up in chair, mildly ill appearing HEENT: NCAT, EOMI, TMs clear bilaterally CV: RRR, no murmurs auscultated PULM: CTAB, no wheezes, rales, rhoncii ABD: + hyperactive bowel sounds, mild TTP diffusely  EXT: 2+ peripheral pulses, <2 sec cap refill.     Assessment & Plan:

## 2012-03-19 NOTE — Assessment & Plan Note (Signed)
Exam consistent with viral gastroenteritis.  Discussed supportive care and general red flags.  Zofran rx given.  Handout given.  Follow up as needed.

## 2012-05-20 ENCOUNTER — Other Ambulatory Visit: Payer: Self-pay | Admitting: Family Medicine

## 2012-05-21 ENCOUNTER — Emergency Department (HOSPITAL_COMMUNITY)
Admission: EM | Admit: 2012-05-21 | Discharge: 2012-05-21 | Disposition: A | Discharge status: Home / Self Care | Payer: Medicaid Other | Attending: Emergency Medicine | Admitting: Emergency Medicine

## 2012-05-21 ENCOUNTER — Encounter (HOSPITAL_COMMUNITY): Payer: Self-pay | Admitting: Emergency Medicine

## 2012-05-21 DIAGNOSIS — Z881 Allergy status to other antibiotic agents status: Secondary | ICD-10-CM | POA: Insufficient documentation

## 2012-05-21 DIAGNOSIS — F17200 Nicotine dependence, unspecified, uncomplicated: Secondary | ICD-10-CM | POA: Insufficient documentation

## 2012-05-21 DIAGNOSIS — Z885 Allergy status to narcotic agent status: Secondary | ICD-10-CM | POA: Insufficient documentation

## 2012-05-21 DIAGNOSIS — I10 Essential (primary) hypertension: Secondary | ICD-10-CM | POA: Insufficient documentation

## 2012-05-21 DIAGNOSIS — G43909 Migraine, unspecified, not intractable, without status migrainosus: Secondary | ICD-10-CM

## 2012-05-21 DIAGNOSIS — Z880 Allergy status to penicillin: Secondary | ICD-10-CM | POA: Insufficient documentation

## 2012-05-21 MED ORDER — HYDROCODONE-ACETAMINOPHEN 5-325 MG PO TABS: 1.0000 | ORAL_TABLET | ORAL | Status: AC | PRN

## 2012-05-21 MED ORDER — KETOROLAC TROMETHAMINE 30 MG/ML IJ SOLN
30.0000 mg | Freq: Once | INTRAMUSCULAR | Status: AC
  Administered 2012-05-21: 30 mg via INTRAVENOUS
  Filled 2012-05-21: qty 1

## 2012-05-21 MED ORDER — SODIUM CHLORIDE 0.9 % IV BOLUS (SEPSIS)
1000.0000 mL | Freq: Once | INTRAVENOUS | Status: AC
  Administered 2012-05-21: 1000 mL via INTRAVENOUS

## 2012-05-21 MED ORDER — METOCLOPRAMIDE HCL 5 MG/ML IJ SOLN
10.0000 mg | Freq: Once | INTRAMUSCULAR | Status: AC
  Administered 2012-05-21: 10 mg via INTRAVENOUS
  Filled 2012-05-21: qty 2

## 2012-05-21 MED ORDER — IBUPROFEN 600 MG PO TABS: 600.0000 mg | ORAL_TABLET | Freq: Four times a day (QID) | ORAL | Status: AC | PRN

## 2012-05-21 NOTE — ED Provider Notes (Signed)
History     CSN: [id number]  Arrival date & time 05/21/12  0056   First MD Initiated Contact with Patient 05/21/12 0408      Chief Complaint  Patient presents with  . Migraine    (Consider location/radiation/quality/duration/timing/severity/associated sxs/prior treatment) HPI Comments: Pt comes in with cc of headache. 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 There is No nausea, vomiting, visual complains, seizures, altered mental status, loss of consciousness, new weakness, or numbness, no gait instability. Pt has had similar headaches in the past, and they had resolved, until the recent injury.  Patient is a 61 y.o. male presenting with migraine. The history is provided by the patient.  Migraine Associated symptoms include headaches. Pertinent negatives include no chest pain, no abdominal pain and no shortness of breath.    Past Medical History  Diagnosis Date  . Barrett's esophagus 2011    Recommend repeat EGD for surveillance in 2 yrs-[facility]  . Cirrhosis   . Pancreatitis   . Pulmonary nodule   . Chronic kidney disease   . Hypertension   . Hyperlipidemia   . Bipolar disorder   . Diverticulosis of colon   . Impotence     Past Surgical History  Procedure Date  . Cardiac catheterization 2005    Nonobstructive, <40%    History reviewed. No pertinent family history.  History  Substance Use Topics  . Smoking status: Current Everyday Smoker -- 0.2 packs/day  . Smokeless tobacco: Not on file   Comment: none in 2 days (6.21.13)  . 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  Respiratory: Negative for cough and shortness of breath.   Cardiovascular: Negative for chest pain.  Gastrointestinal: Negative for abdominal  pain.  Genitourinary: Negative for dysuria.  Neurological: Positive for headaches.    Allergies  Amoxicillin; Codeine phosphate; and Penicillins  Home Medications   Current Outpatient Rx  Name Route Sig Dispense Refill  . ALBUTEROL SULFATE HFA 108 (90 BASE) MCG/ACT IN AERS Inhalation Inhale 2 puffs into the lungs every 6 (six) hours as needed for wheezing. 1 Inhaler 2  . LISINOPRIL-HYDROCHLOROTHIAZIDE 20-12.5 MG PO TABS Oral Take 1 tablet by mouth daily. 90 tablet 1  . OMEPRAZOLE 20 MG PO CPDR Oral Take 1 capsule (20 mg total) by mouth daily. 90 capsule 1  . SIMVASTATIN 20 MG PO TABS Oral Take 1 tablet (20 mg total) by mouth at bedtime. 90 tablet 3  . TOPIRAMATE 50 MG PO TABS Oral Take 50 mg by mouth 2 (two) times daily.      . TRAZODONE HCL 100 MG PO TABS Oral Take 100 mg by mouth at bedtime.     [name] VERAPAMIL HCL 120 MG PO TABS Oral Take 1 tablet (120 mg total) by mouth 3 (three) times daily. 90 tablet 5  . HYDROCODONE-ACETAMINOPHEN 5-325 MG PO TABS Oral Take 1 tablet by mouth every 4 (four) hours as needed for pain. 10 tablet 0  . IBUPROFEN 600 MG PO TABS Oral Take 1 tablet (600 mg total) by mouth every 6 (six) hours as needed for pain. 30 tablet 0    BP 120/69  Pulse 59  Temp 98.2 F (36.8 C) (Oral)  Resp 18  SpO2 94%  Physical Exam  Constitutional: He is oriented to person, place, and time. He appears well-developed.  HENT:  Head: Normocephalic and atraumatic.  Eyes: Conjunctivae and EOM are normal. Pupils are equal, round, and reactive to light.  Neck: Normal range of motion. Neck supple.  Cardiovascular: Normal rate and regular rhythm.   Pulmonary/Chest: Effort normal and breath sounds normal.  Abdominal: Soft. Bowel sounds are normal. He exhibits no distension. There is no tenderness. There is no rebound and no guarding.  Neurological: He is alert and oriented to person, place, and time. No cranial nerve deficit. Coordination normal.  Skin: Skin is warm.    ED Course    Procedures (including critical care time)  Labs Reviewed - No data to display No results found.   1. Migraine       MDM  DDX includes: Primary headaches - including migrainous headaches, cluster headaches, tension headaches. ICH Carotid dissection Cavernous sinus thrombosis Meningitis Encephalitis Sinusitis Tumor Vascular headaches AV malformation Brain aneurysm Muscular headaches Concussion/ Traumatic brain injury  A/P: Pt comes in with cc of headaches. Neuro exam is normal and hx or exam not suggestive of a concerning secondary cause of headache or a specific primary type headache. Concussion possible. Will give toradol, reglan - if responds, will d/c with motrin. Pt requesting Neuro #, as previously his headaches were managed by them. No concerns for life threatening secondary headaches because        Derwood Kaplan, MD 05/21/12 2106

## 2012-05-21 NOTE — ED Notes (Addendum)
Per EMS, patient complaining of headache that started 30 minutes ago; patient has history of migraines -- took Imitrex; has provided little to no relief.  Denies head injury, loss of consciousness, blurred vision, and dizziness.  Stroke scale negative; smile symmetrical.  Reports light sensitivity.

## 2012-05-25 ENCOUNTER — Encounter: Payer: Self-pay | Admitting: Family Medicine

## 2012-05-25 ENCOUNTER — Ambulatory Visit (INDEPENDENT_AMBULATORY_CARE_PROVIDER_SITE_OTHER): Payer: Self-pay | Admitting: Family Medicine

## 2012-05-25 VITALS — BP 128/82 | HR 61 | Temp 98.4°F | Ht 66.0 in | Wt 178.0 lb

## 2012-05-25 DIAGNOSIS — G43909 Migraine, unspecified, not intractable, without status migrainosus: Secondary | ICD-10-CM

## 2012-05-25 MED ORDER — SUMATRIPTAN SUCCINATE 50 MG PO TABS: ORAL_TABLET | ORAL | Status: DC

## 2012-05-25 NOTE — Patient Instructions (Addendum)
Mr. Kwiatek,  Thank you for coming in today.  I have discontinued sumatriptan for Imitrex. Imitrex one tab for headache, may repeat in 2 hrs if needed. No more than 4 doses per month (treat one headache every 2 weeks).  For BP: recheck normal. Continue current regimen.   Dr. Armen Pickup
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
## 2012-05-25 NOTE — Assessment & Plan Note (Signed)
A: pain free today.  P: d/c injectable sumatriptan for oral.  Discussed dosing: one tab prn, may repeat in 2 hrs prn x 1. Treat no more than two headaches per week.

## 2012-06-04 ENCOUNTER — Other Ambulatory Visit: Payer: Self-pay | Admitting: Family Medicine

## 2012-06-11 ENCOUNTER — Other Ambulatory Visit: Payer: Self-pay | Admitting: Family Medicine

## 2012-06-11 NOTE — Telephone Encounter (Signed)
Patient is requesting a refill on his Imitrex to go to [facility] on [address].  He only has 1 or 2 left from the refill on 9/6.  He is also scheduling an appt to be seen about his headaches that come back every other day.

## 2012-06-15 NOTE — Telephone Encounter (Signed)
Spoke with patient and he informed me that he has an appointment tommorow

## 2012-06-15 NOTE — Telephone Encounter (Signed)
I refused this refill request because he should not be using them everyday. Overuse of this medication makes headaches worse. He went through a month rx in 10 days. At most, he should only use once or twice per week.  He can use tylenol or aleve until the office visit, or move up the visit timing if he can't wait. Please inform him, thanks.

## 2012-06-16 ENCOUNTER — Ambulatory Visit: Payer: Self-pay | Admitting: Family Medicine

## 2012-06-17 ENCOUNTER — Ambulatory Visit: Payer: Self-pay | Admitting: Family Medicine

## 2012-06-18 ENCOUNTER — Ambulatory Visit (INDEPENDENT_AMBULATORY_CARE_PROVIDER_SITE_OTHER): Payer: Self-pay | Admitting: Family Medicine

## 2012-06-18 ENCOUNTER — Encounter: Payer: Self-pay | Admitting: Family Medicine

## 2012-06-18 VITALS — BP 134/84 | HR 68 | Temp 98.2°F | Ht 66.0 in | Wt 180.0 lb

## 2012-06-18 DIAGNOSIS — G43909 Migraine, unspecified, not intractable, without status migrainosus: Secondary | ICD-10-CM

## 2012-06-18 LAB — COMPREHENSIVE METABOLIC PANEL
ALT: 25 U/L (ref 0–53)
BUN: 12 mg/dL (ref 6–23)
CO2: 24 mEq/L (ref 19–32)
Calcium: 10 mg/dL (ref 8.4–10.5)
Chloride: 105 mEq/L (ref 96–112)
Creat: 1.11 mg/dL (ref 0.50–1.35)
Glucose, Bld: 78 mg/dL (ref 70–99)

## 2012-06-18 LAB — CBC WITH DIFFERENTIAL/PLATELET
Eosinophils Absolute: 0.2 10*3/uL (ref 0.0–0.7)
Eosinophils Relative: 2 % (ref 0–5)
HCT: 37.3 % — ABNORMAL LOW (ref 39.0–52.0)
Hemoglobin: 12.7 g/dL — ABNORMAL LOW (ref 13.0–17.0)
Lymphocytes Relative: 30 % (ref 12–46)
Lymphs Abs: 3.4 10*3/uL (ref 0.7–4.0)
MCH: 29.5 pg (ref 26.0–34.0)
MCV: 86.7 fL (ref 78.0–100.0)
Monocytes Absolute: 0.8 10*3/uL (ref 0.1–1.0)
Monocytes Relative: 7 % (ref 3–12)
RBC: 4.3 MIL/uL (ref 4.22–5.81)
WBC: 11.3 10*3/uL — ABNORMAL HIGH (ref 4.0–10.5)

## 2012-06-18 MED ORDER — KETOROLAC TROMETHAMINE 60 MG/2ML IM SOLN
60.0000 mg | Freq: Once | INTRAMUSCULAR | Status: AC
  Administered 2012-06-18: 60 mg via INTRAMUSCULAR

## 2012-06-18 MED ORDER — TOPIRAMATE 50 MG PO TABS: 50.0000 mg | ORAL_TABLET | Freq: Two times a day (BID) | ORAL | Status: DC

## 2012-06-18 MED ORDER — CYCLOBENZAPRINE HCL 10 MG PO TABS: 10.0000 mg | ORAL_TABLET | Freq: Every day | ORAL | Status: DC | PRN

## 2012-06-18 NOTE — Assessment & Plan Note (Addendum)
Worsening of chronic migraine without a change in characteristics. While the exacerbation did occur after head trauma, there are no neurologic deficits or signs to warrant head imaging at this time. Exacerbation may be worsened since he seems to have stopped his Topamax prophylaxis, will restart this. Will allow prn Flexeril to use with his slight muscle tightness on exam, perhaps exacerbated by cervical strain. Will not prescribe triptan at this time, as I feel like patient will overuse and contribute to his current rebound cycle. For current abortive treatment, given Toradol injection x1 today. Advised to followup in one week for reassessment or sooner if additional symptoms arise.   >50% of this visit spent counseling regarding medications, medication overuse/rebound headache, behavioral modifications for migraine.
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
## 2012-06-18 NOTE — Patient Instructions (Signed)
Your headaches may have been triggered by injury or neck spasm You can try tylenol or flexeril as needed. Restart topamax. This is sent to pharmacy. Will avoid repeated doses of imitrex which makes headaches rebound. Please come to visit next week. Call for any worsening symptoms.

## 2012-06-29 ENCOUNTER — Ambulatory Visit: Payer: Self-pay | Admitting: Family Medicine

## 2012-06-30 ENCOUNTER — Other Ambulatory Visit: Payer: Self-pay | Admitting: Family Medicine

## 2012-07-03 ENCOUNTER — Other Ambulatory Visit: Payer: Self-pay | Admitting: Family Medicine

## 2012-08-24 ENCOUNTER — Ambulatory Visit: Payer: Self-pay | Admitting: Family Medicine

## 2012-08-24 ENCOUNTER — Encounter: Payer: Self-pay | Admitting: Family Medicine

## 2012-08-24 ENCOUNTER — Ambulatory Visit (INDEPENDENT_AMBULATORY_CARE_PROVIDER_SITE_OTHER): Payer: Medicaid Other | Admitting: Family Medicine

## 2012-08-24 VITALS — BP 136/84 | HR 80 | Temp 99.0°F | Wt 178.0 lb

## 2012-08-24 DIAGNOSIS — F17200 Nicotine dependence, unspecified, uncomplicated: Secondary | ICD-10-CM

## 2012-08-24 DIAGNOSIS — I10 Essential (primary) hypertension: Secondary | ICD-10-CM

## 2012-08-24 DIAGNOSIS — K2270 Barrett's esophagus without dysplasia: Secondary | ICD-10-CM

## 2012-08-24 DIAGNOSIS — L259 Unspecified contact dermatitis, unspecified cause: Secondary | ICD-10-CM

## 2012-08-24 DIAGNOSIS — E785 Hyperlipidemia, unspecified: Secondary | ICD-10-CM

## 2012-08-24 DIAGNOSIS — G43909 Migraine, unspecified, not intractable, without status migrainosus: Secondary | ICD-10-CM

## 2012-08-24 DIAGNOSIS — L400 Psoriasis vulgaris: Secondary | ICD-10-CM | POA: Insufficient documentation

## 2012-08-24 DIAGNOSIS — K5730 Diverticulosis of large intestine without perforation or abscess without bleeding: Secondary | ICD-10-CM

## 2012-08-24 DIAGNOSIS — R079 Chest pain, unspecified: Secondary | ICD-10-CM

## 2012-08-24 DIAGNOSIS — D649 Anemia, unspecified: Secondary | ICD-10-CM | POA: Insufficient documentation

## 2012-08-24 DIAGNOSIS — N644 Mastodynia: Secondary | ICD-10-CM

## 2012-08-24 DIAGNOSIS — L309 Dermatitis, unspecified: Secondary | ICD-10-CM

## 2012-08-24 DIAGNOSIS — Z23 Encounter for immunization: Secondary | ICD-10-CM

## 2012-08-24 DIAGNOSIS — R911 Solitary pulmonary nodule: Secondary | ICD-10-CM

## 2012-08-24 LAB — VITAMIN B12: Vitamin B-12: 309 pg/mL (ref 211–911)

## 2012-08-24 MED ORDER — TOPIRAMATE 50 MG PO TABS: 50.0000 mg | ORAL_TABLET | Freq: Two times a day (BID) | ORAL | Status: DC

## 2012-08-24 MED ORDER — SIMVASTATIN 20 MG PO TABS: 20.0000 mg | ORAL_TABLET | Freq: Every day | ORAL | Status: DC

## 2012-08-24 MED ORDER — LISINOPRIL-HYDROCHLOROTHIAZIDE 20-12.5 MG PO TABS: 1.0000 | ORAL_TABLET | Freq: Every day | ORAL | Status: DC

## 2012-08-24 MED ORDER — FLUOCINONIDE 0.1 % EX CREA: 1.0000 "application " | TOPICAL_CREAM | Freq: Every day | CUTANEOUS | Status: DC | PRN

## 2012-08-24 MED ORDER — VERAPAMIL HCL 120 MG PO TABS: 120.0000 mg | ORAL_TABLET | Freq: Three times a day (TID) | ORAL | Status: DC

## 2012-08-24 MED ORDER — OMEPRAZOLE 20 MG PO CPDR: 20.0000 mg | DELAYED_RELEASE_CAPSULE | Freq: Every day | ORAL | Status: DC

## 2012-08-24 NOTE — Assessment & Plan Note (Signed)
At goal. Refill medications. Followup labs in 3-6 months.

## 2012-08-24 NOTE — Assessment & Plan Note (Signed)
Stable normocytic anemia. Could be related to meds vs alcohol vs less likely GI. Will check stool Hemoccult, anemia studies. Followup in 2-3 months. We'll make new GI referral if a GI source is confirmed, patient due for Barrett's esophagus for surveillance at the end of this year.

## 2012-08-24 NOTE — Assessment & Plan Note (Signed)
Improved with his prophylactic Topamax and verapamil. Reviewed labs including sedimentation rate that were negative. No indication for neurologic imaging at this time. Follow up in 2-3 months

## 2012-08-24 NOTE — Assessment & Plan Note (Signed)
Refilled statin. Followup labs in 3-6 months after compliance.

## 2012-08-24 NOTE — Assessment & Plan Note (Signed)
Mild anemia. Normal colonoscopy 12/2009. Due for repeat in 2016.

## 2012-08-24 NOTE — Assessment & Plan Note (Signed)
Plan repeat CT scan in March 2014 for pulmonary nodule surveillance.

## 2012-08-24 NOTE — Assessment & Plan Note (Addendum)
On review of records, he is due for EGD 2013. Yearly biopsies since 2009 have been negative. Pt seems to have been lost to follow up as he has no memory of needing any further procedures. Will make new referral to [facility] for continued surveillance at next visit. Patient to return stool cards for mild anemia.

## 2012-08-24 NOTE — Progress Notes (Signed)
  Subjective:    Patient ID: Jaime Barnes, male    DOB: Sep 13, 1951, 61 y.o.   MRN: [id number]  HPI  1. F/u headaches. Patient restarted taking his Topamax and states this has been working well. Has not had a headache in recent memory. He also is able to stop smoking in the past few months which may have helped. Denies any weakness, confusion, visual changes.  2. hypertension. Patient has run out of his medications and request refills be sent to the pharmacy.  3. tobacco abuse. States he was doing well with cessation until the holiday time started, his significant other causing him stress now he is smoking 3-4 cigarettes daily again. States they're both planning on a cessation date January 1. Denies any fever, cough, night sweats. Patient is a 4 mm pulmonary nodule due for yearly surveillance CT scan in March 2014.  4. PUD/Barrett's esophagus. Patient requests refill on PPI. Review in the records, notes that he is scheduled to have EGD at [facility] followup in 2013. Barrett esophagus been followed with biopsies that have been negative since 2009.   5. mild normocytic anemia. Has been stable. Patient denies any blood or dark tarry stools. He feels well. Last colonoscopy April 2011 showed a single tubular adenoma that was removed, plan for five-year interval screening. Also hx of alcoholism could be causative.  Review of Systems See HPI otherwise negative.  reports that he has been smoking.  He does not have any smokeless tobacco history on file.     Objective:   Physical Exam  Vitals reviewed. Constitutional: He is oriented to person, place, and time. He appears well-developed and well-nourished. No distress.  HENT:  Head: Normocephalic and atraumatic.  Mouth/Throat: Oropharynx is clear and moist.  Eyes: EOM are normal.  Cardiovascular: Normal rate, regular rhythm and normal heart sounds.   No murmur heard. Pulmonary/Chest: Effort normal and breath sounds normal.  Abdominal: Soft. Bowel  sounds are normal. He exhibits no distension.  Musculoskeletal: He exhibits no edema and no tenderness.  Neurological: He is alert and oriented to person, place, and time.  Skin: He is not diaphoretic.       Left elbow thickened hyperkeratotic plaque. No erythema.  Psychiatric: He has a normal mood and affect. Thought content normal.       Assessment & Plan:

## 2012-08-24 NOTE — Assessment & Plan Note (Signed)
Had brief success with cessation now relapsed. Planning again for quit date January 1. Patient not interested in medication at this time. Followup in 2-3 months

## 2012-08-25 ENCOUNTER — Telehealth: Payer: Self-pay | Admitting: Family Medicine

## 2012-08-25 ENCOUNTER — Other Ambulatory Visit: Payer: Self-pay | Admitting: Family Medicine

## 2012-08-25 MED ORDER — FLUOCINONIDE 0.05 % EX OINT: TOPICAL_OINTMENT | Freq: Two times a day (BID) | CUTANEOUS | Status: DC

## 2012-08-25 NOTE — Telephone Encounter (Signed)
Called and informed patient of below.

## 2012-08-25 NOTE — Telephone Encounter (Signed)
Patient is calling because Medicaid doesn't cover the cream that was prescribed and he would like something that Medicaid does cover.

## 2012-08-25 NOTE — Telephone Encounter (Signed)
Please notify new generic rx has been sent.

## 2012-08-28 LAB — FOLATE: Folate: 19.6 ng/mL (ref >5.4)

## 2012-08-31 ENCOUNTER — Telehealth: Payer: Self-pay | Admitting: *Deleted

## 2012-08-31 NOTE — Telephone Encounter (Signed)
Called and informed pt's partner Trish Mage) that is results did not show any iron deficiency and told her to tell him to turn in the stool cards. She told me that he has already turned those in. She proceeded to tell me that he needed some cream for his eczema and what was called in Medicaid would not pay for it. She stated that it was some other kind of cream that they would pay for and when she found out the name of it she would call back and let us know what it is so that it can be sent in to his pharmacy.Loralee Pacas [place]

## 2012-08-31 NOTE — Telephone Encounter (Signed)
Message copied by Deno Etienne on Tue Aug 31, 2012  4:23 PM ------      Message from: Durwin Reges      Created: Tue Aug 31, 2012  9:03 AM       Results do not show iron deficiency. Please remind him to return stool cards.

## 2012-09-02 LAB — POC HEMOCCULT BLD/STL (HOME/3-CARD/SCREEN)
Card #2 Fecal Occult Blod, POC: POSITIVE
Card #3 Fecal Occult Blood, POC: NEGATIVE

## 2012-09-02 NOTE — Addendum Note (Signed)
Addended by: [place], Witten Certain on: 09/02/2012 04:23 PM   Modules accepted: Orders

## 2012-09-09 ENCOUNTER — Ambulatory Visit: Payer: Self-pay | Admitting: Family Medicine

## 2012-09-09 ENCOUNTER — Telehealth: Payer: Self-pay | Admitting: *Deleted

## 2012-09-09 NOTE — Telephone Encounter (Signed)
Called pt to inform 1/3 + on fecal occult blood. He did not show up for appt today. Advised he is due for GI evaluation with Barretts esophagus and informed to make appt or call for problems.

## 2012-09-09 NOTE — Telephone Encounter (Signed)
LVM on carol's VM for her to call back to inform her that per Dr. Cristal Ford patient needs to schedule an appointment with his GI doctor due to him having positive blood in stool cards

## 2012-10-11 ENCOUNTER — Encounter: Payer: Self-pay | Admitting: Family Medicine

## 2012-10-11 ENCOUNTER — Ambulatory Visit (INDEPENDENT_AMBULATORY_CARE_PROVIDER_SITE_OTHER): Payer: Medicaid Other | Admitting: Family Medicine

## 2012-10-11 VITALS — BP 152/88 | HR 92 | Temp 99.0°F | Ht 66.0 in | Wt 179.0 lb

## 2012-10-11 DIAGNOSIS — J111 Influenza due to unidentified influenza virus with other respiratory manifestations: Secondary | ICD-10-CM

## 2012-10-11 MED ORDER — OSELTAMIVIR PHOSPHATE 75 MG PO CAPS: 75.0000 mg | ORAL_CAPSULE | Freq: Two times a day (BID) | ORAL | Status: DC

## 2012-10-11 NOTE — Patient Instructions (Addendum)
I think you do have the flu.   Take the Tamiflu for the next 5 days   If you're not feeling better by next week, come back and see [place].

## 2012-10-12 DIAGNOSIS — J111 Influenza due to unidentified influenza virus with other respiratory manifestations: Secondary | ICD-10-CM | POA: Insufficient documentation

## 2012-10-12 NOTE — Progress Notes (Signed)
Patient ID: KENZEL RUESCH, male   DOB: 1951-05-24, 62 y.o.   MRN: [id number] SUBJECTIVE:  WHITT AULETTA is a 62 y.o. male who present complaining of flu-like symptoms: fevers, chills, myalgias, congestion, sore throat and cough for 2 days. Denies dyspnea or wheezing.  OBJECTIVE: Appears moderately ill but not toxic; temperature as noted in vitals. Ears normal. Throat and pharynx normal.  Neck supple. No adenopathy in the neck. Sinuses non tender. The chest is clear.

## 2012-10-12 NOTE — Assessment & Plan Note (Signed)
Patient did have flu shot. However due to the symptoms that he feels that I plan to treat today. Tamilu po x 5 days.  FU prn or if worsening.

## 2012-11-08 ENCOUNTER — Encounter: Payer: Self-pay | Admitting: Family Medicine

## 2012-11-08 ENCOUNTER — Ambulatory Visit (INDEPENDENT_AMBULATORY_CARE_PROVIDER_SITE_OTHER): Payer: Medicaid Other | Admitting: Family Medicine

## 2012-11-08 VITALS — BP 148/92 | HR 80 | Temp 98.0°F | Ht 66.0 in | Wt 188.0 lb

## 2012-11-08 DIAGNOSIS — L03211 Cellulitis of face: Secondary | ICD-10-CM

## 2012-11-08 DIAGNOSIS — L0201 Cutaneous abscess of face: Secondary | ICD-10-CM

## 2012-11-08 MED ORDER — CLINDAMYCIN HCL 300 MG PO CAPS: 300.0000 mg | ORAL_CAPSULE | Freq: Three times a day (TID) | ORAL | Status: DC

## 2012-11-08 NOTE — Patient Instructions (Signed)
Cellulitis  Cellulitis is an infection of the skin and the tissue under the skin. The infected area is usually red and tender. This happens most often in the arms and lower legs.  HOME CARE    Take your antibiotic medicine as told. Finish the medicine even if you start to feel better.   Keep the infected arm or leg raised (elevated).   Put a warm cloth on the area up to 4 times per day.   Only take medicines as told by your doctor.   Keep all doctor visits as told.  GET HELP RIGHT AWAY IF:    You have a fever.   You feel very sleepy.   You throw up (vomit) or have watery poop (diarrhea).   You feel sick and have muscle aches and pains.   You see red streaks on the skin coming from the infected area.   Your red area gets bigger or turns a dark color.   Your bone or joint under the infected area is painful after the skin heals.   Your infection comes back in the same area or different area.   You have a puffy (swollen) bump in the infected area.   You have new symptoms.  MAKE SURE YOU:    Understand these instructions.   Will watch your condition.   Will get help right away if you are not doing well or get worse.  Document Released: 03/03/2008 Document Revised: 03/16/2012 Document Reviewed: 12/01/2011  [employer] Patient Information 2013 [employer].

## 2012-11-08 NOTE — Progress Notes (Signed)
  Subjective:    Patient ID: Jaime Barnes, male    DOB: 1951/03/07, 62 y.o.   MRN: [id number]  HPI 62 y.o. male with painful lesion on left side of nose/mouth for one week. Now with swelling under left eye. Bumps are itchy. Was using cream on the bumps but didn't work. No change in vision. Lesions are red, not draining. Nose hurts when sniffs in. Feels like something crawling in nose. Pain is not burning. Rates it 1/10.   Review of Systems  Constitutional: Negative for fever and chills.  HENT: Negative for ear pain, nosebleeds, sore throat, mouth sores, neck pain, neck stiffness, tinnitus and ear discharge.   Eyes: Negative for discharge and itching.  Respiratory: Negative for chest tightness and shortness of breath.   Cardiovascular: Negative for chest pain.  Gastrointestinal: Negative for nausea and vomiting.  Skin: Positive for rash.  Neurological: Negative for dizziness, facial asymmetry, weakness, light-headedness and headaches.       Objective:   Physical Exam  Constitutional: He is oriented to person, place, and time. He appears well-developed and well-nourished. No distress.  HENT:  Head: Normocephalic and atraumatic.  Right Ear: External ear normal.  Left Ear: External ear normal.  Nose: Nose normal.  Mouth/Throat: Oropharynx is clear and moist.  Eyes: Conjunctivae and EOM are normal. Pupils are equal, round, and reactive to light. Right eye exhibits no discharge. Left eye exhibits no discharge.  Vision grossly intact and equal bilaterally. EOMI intact bilaterally, no pain with eye movement.  Neck: Normal range of motion. Neck supple.  Cardiovascular: Normal rate, regular rhythm and normal heart sounds.   Pulmonary/Chest: Effort normal and breath sounds normal. No respiratory distress.  Lymphadenopathy:    Cervical adenopathy: R anterior cervical node ~1cm, mobile, non-tender.  Neurological: He is alert and oriented to person, place, and time.  Skin: Skin is warm and  dry.  Scattered small erythematous scabbed papules on left side of nose near eye and at lateral fold. Non-draining, no bleeding, non-tender. Swelling of left maxilla and infraorbital area, mild tenderness. No redness or fluctuance.  Psychiatric: He has a normal mood and affect.       Assessment & Plan:  62 y.o. male with rash on L nose and swelling of cheek - impetigo/cellulitis vs shingles. Symptoms not indicative of shingles. - No visual changes - Treat with clindamycin x 1 week for cellulitis and f/u. - Pt advised to go to ER if he has vision changes, eye pain, fever/chills.  Napoleon Form, MD

## 2012-11-15 ENCOUNTER — Ambulatory Visit: Payer: Self-pay | Admitting: Family Medicine

## 2012-11-28 ENCOUNTER — Encounter (HOSPITAL_COMMUNITY): Payer: Self-pay | Admitting: Emergency Medicine

## 2012-11-28 ENCOUNTER — Emergency Department (HOSPITAL_COMMUNITY)
Admission: EM | Admit: 2012-11-28 | Discharge: 2012-11-28 | Disposition: A | Discharge status: Home / Self Care | Payer: Medicaid Other | Source: Home / Self Care | Attending: Emergency Medicine | Admitting: Emergency Medicine

## 2012-11-28 DIAGNOSIS — J111 Influenza due to unidentified influenza virus with other respiratory manifestations: Secondary | ICD-10-CM

## 2012-11-28 DIAGNOSIS — Z720 Tobacco use: Secondary | ICD-10-CM

## 2012-11-28 MED ORDER — FEXOFENADINE-PSEUDOEPHED ER 60-120 MG PO TB12: 1.0000 | ORAL_TABLET | Freq: Two times a day (BID) | ORAL | Status: DC

## 2012-11-28 MED ORDER — OSELTAMIVIR PHOSPHATE 75 MG PO CAPS: 75.0000 mg | ORAL_CAPSULE | Freq: Two times a day (BID) | ORAL | Status: DC

## 2012-11-28 NOTE — ED Notes (Signed)
Pt c/o flu like symptoms. Generalized body aches. Dizziness. Fatigue. Nausea with 1 vomiting episode this a.m. Denies diarrhea and fever. Symptoms present since Thursday.  Pt has tried mucinex with some relief of congestion.

## 2012-11-28 NOTE — ED Provider Notes (Addendum)
Chief Complaint  Patient presents with  . Influenza    body aches. chills . dizzy. fatigue. 1 vomiting episode this a.m    History of Present Illness:   Jaime Barnes  is a 62 year old male who has had a four-day history of weakness, malaise, fatigue, dizziness, chills, and felt feverish. He's had a cough productive of some yellow sputum, no wheezing but some chest pain, nasal congestion, sneezing, rhinorrhea with clear drainage, right ear pressure, a dry throat, he vomited once. He's been exposed to her brother who has a flu like illnes. He's allergic to penicillin and codeine. He has hypertension, hyperlipidemia, migraine headaches, bipolar disorder, and he smokes about 10 cigarettes per day.  Review of Systems:  Other than noted above, the patient denies any of the following symptoms. Systemic:  No fever, chills, sweats, fatigue, myalgias, headache, or anorexia. Eye:  No redness, pain or drainage. ENT:  No earache, ear congestion, nasal congestion, sneezing, rhinorrhea, sinus pressure, sinus pain, post nasal drip, or sore throat. Lungs:  No cough, sputum production, wheezing, shortness of breath, or chest pain. GI:  No abdominal pain, nausea, vomiting, or diarrhea.  PMFSH:  Past medical history, family history, social history, meds, and allergies were reviewed.  Physical Exam:   Vital signs:  BP 120/69  Pulse 80  Temp(Src) 99.4 F (37.4 C) (Oral)  Resp 18  SpO2 97% General:  Alert, in no distress. Eye:  No conjunctival injection or drainage. Lids were normal. ENT:  TMs and canals were normal, without erythema or inflammation.  Nasal mucosa was clear and uncongested, without drainage.  Mucous membranes were moist.  Pharynx was clear, without exudate or drainage.  There were no oral ulcerations or lesions. Neck:  Supple, no adenopathy, tenderness or mass. Lungs:  No respiratory distress.  Lungs were clear to auscultation, without wheezes, rales or rhonchi.  Breath sounds were clear and  equal bilaterally.  Heart:  Regular rhythm, without gallops, murmers or rubs. Skin:  Clear, warm, and dry, without rash or lesions.  Assessment:  The primary encounter diagnosis was Influenza-like illness. A diagnosis of Tobacco abuse was also pertinent to this visit.  Plan:   1.  The following meds were prescribed:   Discharge Medication List as of 11/28/2012 12:05 PM    START taking these medications   Details  fexofenadine-pseudoephedrine (ALLEGRA-D) 60-120 MG per tablet Take 1 tablet by mouth every 12 (twelve) hours., Starting 11/28/2012, Until Discontinued, Normal    !! oseltamivir (TAMIFLU) 75 MG capsule Take 1 capsule (75 mg total) by mouth every 12 (twelve) hours., Starting 11/28/2012, Until Discontinued, Normal     !! - Potential duplicate medications found. Please discuss with Tazia Illescas.     2.  The patient was instructed in symptomatic care and handouts were given. He was strongly encouraged to quit smoking. 3.  The patient was told to return if becoming worse in any way, if no better in 3 or 4 days, and given some red flag symptoms that would indicate earlier return.   Reuben Likes, MD 11/28/12 2018  Reuben Likes, MD 11/29/12 [phone]

## 2012-12-06 ENCOUNTER — Other Ambulatory Visit: Payer: Self-pay | Admitting: Family Medicine

## 2012-12-06 MED ORDER — ALBUTEROL SULFATE HFA 108 (90 BASE) MCG/ACT IN AERS: 2.0000 | INHALATION_SPRAY | Freq: Four times a day (QID) | RESPIRATORY_TRACT | Status: DC | PRN

## 2013-01-04 ENCOUNTER — Other Ambulatory Visit: Payer: Self-pay | Admitting: Family Medicine

## 2013-01-28 ENCOUNTER — Ambulatory Visit: Payer: Medicaid Other | Admitting: Family Medicine

## 2013-02-16 ENCOUNTER — Ambulatory Visit: Payer: Medicaid Other | Admitting: Family Medicine

## 2013-03-30 ENCOUNTER — Ambulatory Visit: Payer: Medicaid Other | Admitting: Sports Medicine

## 2013-04-06 ENCOUNTER — Ambulatory Visit: Payer: Medicaid Other | Admitting: Sports Medicine

## 2013-04-12 ENCOUNTER — Telehealth: Payer: Self-pay | Admitting: Sports Medicine

## 2013-04-12 DIAGNOSIS — G43909 Migraine, unspecified, not intractable, without status migrainosus: Secondary | ICD-10-CM

## 2013-04-12 NOTE — Telephone Encounter (Signed)
Patient is calling for refills on all of his meds.  Since [facility] has taken over [facility], he hasn't been able to get his refills on any of his meds.  It is [facility] on [address] and he needs Verapamil, Albuterol, Allegra, Clindamycin, Lidex, Lisinopril HCTZ, Omeprazole, Simvastatin, Topamax, Flexeril and Imitrex.  Some of these he has been without for over a month.  He would like to get these refilled as soon as possible.  He is scheduled to be seen by Dr. Berline Chough on 7/29.

## 2013-04-12 NOTE — Telephone Encounter (Signed)
Will fwd to MD.  Rene Gonsoulin L, CMA  

## 2013-04-14 MED ORDER — SIMVASTATIN 20 MG PO TABS: 20.0000 mg | ORAL_TABLET | Freq: Every day | ORAL | Status: DC

## 2013-04-14 MED ORDER — OMEPRAZOLE 20 MG PO CPDR: 20.0000 mg | DELAYED_RELEASE_CAPSULE | Freq: Every day | ORAL | Status: DC

## 2013-04-14 MED ORDER — CYCLOBENZAPRINE HCL 10 MG PO TABS: 10.0000 mg | ORAL_TABLET | Freq: Every day | ORAL | Status: DC | PRN

## 2013-04-14 MED ORDER — ALBUTEROL SULFATE HFA 108 (90 BASE) MCG/ACT IN AERS: 2.0000 | INHALATION_SPRAY | Freq: Four times a day (QID) | RESPIRATORY_TRACT | Status: DC | PRN

## 2013-04-14 MED ORDER — TOPIRAMATE 50 MG PO TABS: 50.0000 mg | ORAL_TABLET | Freq: Two times a day (BID) | ORAL | Status: DC

## 2013-04-14 MED ORDER — LISINOPRIL-HYDROCHLOROTHIAZIDE 20-12.5 MG PO TABS: 1.0000 | ORAL_TABLET | Freq: Every day | ORAL | Status: DC

## 2013-04-14 MED ORDER — VERAPAMIL HCL 120 MG PO TABS: 120.0000 mg | ORAL_TABLET | Freq: Three times a day (TID) | ORAL | Status: DC

## 2013-04-14 NOTE — Telephone Encounter (Signed)
Refilled requested  

## 2013-04-14 NOTE — Telephone Encounter (Signed)
Pt notified.  Larrie Lucia L, CMA  

## 2013-04-14 NOTE — Telephone Encounter (Signed)
Any further refills left will need to be discussed at his next appointment

## 2013-04-26 ENCOUNTER — Ambulatory Visit (INDEPENDENT_AMBULATORY_CARE_PROVIDER_SITE_OTHER): Payer: Medicaid Other | Admitting: Sports Medicine

## 2013-04-26 VITALS — BP 123/74 | HR 76 | Temp 98.1°F | Ht 66.0 in | Wt 177.0 lb

## 2013-04-26 DIAGNOSIS — R109 Unspecified abdominal pain: Secondary | ICD-10-CM

## 2013-04-26 DIAGNOSIS — M76821 Posterior tibial tendinitis, right leg: Secondary | ICD-10-CM

## 2013-04-26 DIAGNOSIS — F17200 Nicotine dependence, unspecified, uncomplicated: Secondary | ICD-10-CM

## 2013-04-26 DIAGNOSIS — I10 Essential (primary) hypertension: Secondary | ICD-10-CM

## 2013-04-26 DIAGNOSIS — M76829 Posterior tibial tendinitis, unspecified leg: Secondary | ICD-10-CM

## 2013-04-26 DIAGNOSIS — R911 Solitary pulmonary nodule: Secondary | ICD-10-CM

## 2013-04-26 LAB — POCT URINALYSIS DIPSTICK
Bilirubin, UA: NEGATIVE
Blood, UA: NEGATIVE
Glucose, UA: NEGATIVE
Ketones, UA: NEGATIVE
Leukocytes, UA: NEGATIVE
Nitrite, UA: NEGATIVE
Protein, UA: NEGATIVE
Spec Grav, UA: 1.025
Urobilinogen, UA: 0.2
pH, UA: 5.5

## 2013-04-26 MED ORDER — MELOXICAM 15 MG PO TABS: 15.0000 mg | ORAL_TABLET | Freq: Every day | ORAL | Status: DC

## 2013-04-26 NOTE — Progress Notes (Signed)
  [facility]  Patient name: Jaime Barnes MRN [id number]  Date of birth: 08/04/1951  CC & HPI:  Jaime Barnes is a 62 y.o. male presenting today for:  # R Ankle Pain: wants to be seen by Dr. Okey Dupre - [phone] Context  Chronic Pain withy any motion  Onset/Duration:  1 year, acute worse X 1 week  Illicit ing Event/Injury:  No  Character:  Throbbing pain   Severity:  8/10 up to 10/10 at night   Radiation:  none  Aggravating:  walking, weight bearing  Associated Symptoms:  none - initially reported flank pain but denies to me  Effective Therapies:  none  Inneffective Therapies:  left foot ASO worn on R  RED FLAGS:  no falls, trauma or instability    #  Hypertension - chronic problem, well controlled.    no orthostasis,   no peripheral edema  no chest pain, no dyspnea on exertion, no orthopnea/PND  no episodes of unilateral weakness, dysarthria or acute visual changes   # Tobacco abuse: not interested in quitting at this time  ROS:  PER HPI  Pertinent History Reviewed:  Medical & Surgical Hx:  Reviewed: Significant for Prior Pulmonary Nodule, Medications: Reviewed & Updated - see associated section Social History: Reviewed -  reports that he has been smoking.  He does not have any smokeless tobacco history on file.  Objective Findings:  Vitals: BP 123/74  Pulse 76  Temp(Src) 98.1 F (36.7 C) (Oral)  Ht 5\' 6"  (1.676 m)  Wt 177 lb (80.287 kg)  BMI 28.58 kg/m2  PE: GENERAL: adult  male. In no discomfort; no respiratory distress  PSYCH:  alert and appropriate, good insight   HNEENT:    CARDIO:  RRR, S1/S2 heard, no murmur  LUNGS:  CTA B, no wheezes, no crackles  ABDOMEN:    EXTREM: Warm well perfused Moves all 4 spontaneously, no lateralization Distal pulses 2+/4  GU:   SKIN:   NEUROMSK: ankle Exam: Appearance:  normal appearing, no deformity, no swelling, left foot ASO in place on R foot  Palpation:  sig TTP along posterior tib tendon  and muscle No talar dome tenderness  ROM: Active Passive  Significant Limited in a 4 planes     Neurovascular:   see above  MSK Testing:  neg drawer B Good posterior tib muscle recruitment Bilaterally       Assessment & Plan:

## 2013-04-26 NOTE — Patient Instructions (Addendum)
It was nice to see you today.   Today we discussed: Tibialis posterior tendinitis, right Please take: - meloxicam (MOBIC) 15 MG tablet; Take 1 tablet (15 mg total) by mouth daily.  Dispense: 30 tablet; Refill: 0  I have referred you to: - Ambulatory referral to Podiatry - Dr. Okey Dupre - [phone]  Remember to do your exercises   Please plan to return to see me in 3 months.  If you need anything prior to seeing me please call the clinic.  Please Bring all medications with you to each appointment.

## 2013-05-03 DIAGNOSIS — R109 Unspecified abdominal pain: Secondary | ICD-10-CM | POA: Insufficient documentation

## 2013-05-03 DIAGNOSIS — M76829 Posterior tibial tendinitis, unspecified leg: Secondary | ICD-10-CM | POA: Insufficient documentation

## 2013-05-05 NOTE — Assessment & Plan Note (Signed)
Stable/Well Controlled - no changes at this time. 

## 2013-05-05 NOTE — Assessment & Plan Note (Signed)
Pre contemplative - encouraged to quit

## 2013-05-05 NOTE — Assessment & Plan Note (Signed)
Order placed previously Pt encouraged to schedule >readdress at next visit if not completed

## 2013-05-05 NOTE — Assessment & Plan Note (Addendum)
Significant TTP over posterior Tibialis, likely tendonopathy Given ROM exercises and recommended to stay out of brace Rx mobic X 2 weeks Pt established with podiatry but needs referall.- happy to do so

## 2013-06-28 ENCOUNTER — Encounter: Payer: Self-pay | Admitting: Sports Medicine

## 2013-06-28 ENCOUNTER — Ambulatory Visit: Payer: Medicaid Other | Admitting: Sports Medicine

## 2013-07-08 ENCOUNTER — Ambulatory Visit: Payer: Medicaid Other | Admitting: Sports Medicine

## 2013-07-29 ENCOUNTER — Encounter: Payer: Self-pay | Admitting: Sports Medicine

## 2013-07-29 ENCOUNTER — Ambulatory Visit (INDEPENDENT_AMBULATORY_CARE_PROVIDER_SITE_OTHER): Payer: Medicaid Other | Admitting: Sports Medicine

## 2013-07-29 VITALS — BP 111/77 | HR 86 | Temp 97.8°F | Ht 66.0 in | Wt 182.1 lb

## 2013-07-29 DIAGNOSIS — I10 Essential (primary) hypertension: Secondary | ICD-10-CM

## 2013-07-29 DIAGNOSIS — R911 Solitary pulmonary nodule: Secondary | ICD-10-CM

## 2013-07-29 DIAGNOSIS — F17200 Nicotine dependence, unspecified, uncomplicated: Secondary | ICD-10-CM

## 2013-07-29 DIAGNOSIS — K2270 Barrett's esophagus without dysplasia: Secondary | ICD-10-CM

## 2013-07-29 DIAGNOSIS — Z23 Encounter for immunization: Secondary | ICD-10-CM

## 2013-07-29 MED ORDER — OMEPRAZOLE 20 MG PO CPDR: 20.0000 mg | DELAYED_RELEASE_CAPSULE | Freq: Every day | ORAL | Status: DC

## 2013-07-29 MED ORDER — LISINOPRIL-HYDROCHLOROTHIAZIDE 10-12.5 MG PO TABS: 1.0000 | ORAL_TABLET | Freq: Every day | ORAL | Status: DC

## 2013-07-29 NOTE — Assessment & Plan Note (Signed)
Repeat CT scheduled today.  

## 2013-07-29 NOTE — Assessment & Plan Note (Signed)
Patient seems to be having orthostatic symptoms.  Blood pressure well controlled if not too well. > Cut blood pressure pills in half, refills provided for appropriate dosage.

## 2013-07-29 NOTE — Patient Instructions (Signed)
   Follow up ASAP for R 1st nail removal  Go to GET CT Scan of chest.  Refilled stomach medication  Cut your BP in half.  I have sent in a new prescription for when you run out   If you need anything prior to your next visit please call the clinic. Please Bring all medications or accurate medication list with you to each appointment; an accurate medication list is essential in providing you the best care possible.

## 2013-07-29 NOTE — Assessment & Plan Note (Signed)
Encouraged to quit. 

## 2013-07-29 NOTE — Progress Notes (Signed)
[facility] Jaime Barnes - 62 y.o. male MRN [id number]  Date of birth: 01/14/1951  CC, HPI, Interval History & ROS  Jaime Barnes is here today to followup on his chronic medical conditions including:  Hypertension, coronary artery disease, chronic pancreatitis, bipolar disorder, tobacco dependence  He reports that overall he has been feeling more fatigued.  He occasionally has orthostasis.  Pt denies chest pain, dyspnea at rest or exertion, PND, lower extremity edema.  Patient denies any facial asymmetry, unilateral weakness, or dysarthria.  He has remained sober but discontinued use tobacco.  Continues to be seen by psychiatry.  Other acute problems include:  R Thumb: Significantly dystrophic nail worsened over past 2-3 weeks.  Present for 7-8 years.  Reports it seems to be smelling.  Denies any redness or erythema.  Pt denies any fevers, chills, or rigors.  Pertinent History & Care Coordination  Jaime Barnes's major active medical problems include: # CVD: HTN, HLD, CAD without MI, ED CATH Jul 03, 2004 - Non Obstructive CAD # GI: Chronic Pancreatitis, GERD with Barrett's Esophagus, Alcoholic Cirrhosis, diverticulosis, Hemorrhoids Normal GI  # PSYCH: BiPolar disorder  # Substance Abuse: Tobacco Dependence, Hx of Alcohol Abuse, marijuana use  Other Pertinent Med/Surg/Hosp History: # Herpes Simplex - annual recurrence  # Breast pain - s/p normal mammogram - likely due to marijuana use # Anemia  Other Care Providers include:  Follow up Issues:  >PULMONARY NODULE - needs repeat chest CT (due March 2013)  History  Smoking status  . Current Every Day Smoker -- 0.25 packs/day  Smokeless tobacco  . Not on file    Comment: none in 2 days (6.21.13)   Health Maintenance Due  Topic  . Zostavax   . Influenza Vaccine    No results found for this basename: HGBA1C, TRIG, CHOL, HDL, LDLCALC, TSH,  in the last 8760 hours   Otherwise past Medical, Surgical, Social, and  Family History Reviewed per EMR Medications and Allergies reviewed and all updated if necessary. Objective Findings  VITALS: HR: 86 bpm  BP: 111/77 mmHg  TEMP: 97.8 F (36.6 C) (Oral)  RESP:    HT: 5\' 6"  (167.6 cm)  WT: 182 lb 1.6 oz (82.6 kg)  BMI: 29.5   BP Readings from Last 3 Encounters:  07/29/13 111/77  04/26/13 123/74  11/28/12 120/69   Wt Readings from Last 3 Encounters:  07/29/13 182 lb 1.6 oz (82.6 kg)  04/26/13 177 lb (80.287 kg)  11/08/12 188 lb (85.276 kg)     PHYSICAL EXAM: GENERAL:  adult [place] American male male. In no discomfort; no respiratory distress  PSYCH: alert and appropriate, good insight   HNEENT:   CARDIO: RRR, S1/S2 heard, no murmur  LUNGS: CTA B, no wheezes, no crackles  ABDOMEN:   EXTREM:  Warm, well perfused.  No cyanosis  Moves all 4 extremities spontaneously; no lateralization.  No noted foot lesions.  Right thumb has a significantly dystrophic nail that is separating from the nailbed.  There is no surrounding erythema, there is no pus, there is no associated swelling.  Distal pulses 2+/4.  no pretibial edema.  GU:   SKIN:     Assessment & Plan   Problems addressed today: General Plan & Pt Instructions:  No diagnosis found.    Follow up ASAP for R 1st nail removal  Go to GET CT Scan of chest.  Refilled stomach medication  Cut your BP in half.  I have sent in a new prescription for when  you run out     For further discussion of A/P and for follow up issues see problem based charting.

## 2013-08-09 ENCOUNTER — Ambulatory Visit: Payer: Medicaid Other | Admitting: Sports Medicine

## 2013-08-11 ENCOUNTER — Ambulatory Visit (INDEPENDENT_AMBULATORY_CARE_PROVIDER_SITE_OTHER): Payer: Self-pay | Admitting: Sports Medicine

## 2013-08-23 ENCOUNTER — Ambulatory Visit (INDEPENDENT_AMBULATORY_CARE_PROVIDER_SITE_OTHER): Payer: Medicaid Other | Admitting: Sports Medicine

## 2013-08-23 VITALS — BP 128/76 | HR 71 | Temp 99.2°F | Ht 66.0 in | Wt 181.0 lb

## 2013-08-23 DIAGNOSIS — I10 Essential (primary) hypertension: Secondary | ICD-10-CM

## 2013-08-23 DIAGNOSIS — G43909 Migraine, unspecified, not intractable, without status migrainosus: Secondary | ICD-10-CM

## 2013-08-23 DIAGNOSIS — L603 Nail dystrophy: Secondary | ICD-10-CM

## 2013-08-23 DIAGNOSIS — F319 Bipolar disorder, unspecified: Secondary | ICD-10-CM

## 2013-08-23 DIAGNOSIS — E785 Hyperlipidemia, unspecified: Secondary | ICD-10-CM

## 2013-08-23 DIAGNOSIS — R911 Solitary pulmonary nodule: Secondary | ICD-10-CM

## 2013-08-23 DIAGNOSIS — L608 Other nail disorders: Secondary | ICD-10-CM

## 2013-08-23 LAB — CBC
HCT: 36.4 % — ABNORMAL LOW (ref 39.0–52.0)
Hemoglobin: 12.9 g/dL — ABNORMAL LOW (ref 13.0–17.0)
RBC: 4.21 MIL/uL — ABNORMAL LOW (ref 4.22–5.81)

## 2013-08-23 MED ORDER — ALBUTEROL SULFATE HFA 108 (90 BASE) MCG/ACT IN AERS: 2.0000 | INHALATION_SPRAY | Freq: Four times a day (QID) | RESPIRATORY_TRACT | Status: DC | PRN

## 2013-08-23 MED ORDER — ATORVASTATIN CALCIUM 40 MG PO TABS: 40.0000 mg | ORAL_TABLET | Freq: Every day | ORAL | Status: DC

## 2013-08-23 MED ORDER — TOPIRAMATE 50 MG PO TABS: 50.0000 mg | ORAL_TABLET | Freq: Two times a day (BID) | ORAL | Status: DC

## 2013-08-23 MED ORDER — OMEPRAZOLE 20 MG PO CPDR: 20.0000 mg | DELAYED_RELEASE_CAPSULE | Freq: Every day | ORAL | Status: DC

## 2013-08-23 MED ORDER — TERBINAFINE HCL 250 MG PO TABS: 250.0000 mg | ORAL_TABLET | Freq: Every day | ORAL | Status: DC

## 2013-08-23 MED ORDER — VERAPAMIL HCL 120 MG PO TABS: 120.0000 mg | ORAL_TABLET | Freq: Three times a day (TID) | ORAL | Status: DC

## 2013-08-23 MED ORDER — LISINOPRIL-HYDROCHLOROTHIAZIDE 10-12.5 MG PO TABS: 1.0000 | ORAL_TABLET | Freq: Every day | ORAL | Status: DC

## 2013-08-23 MED ORDER — TRAZODONE HCL 100 MG PO TABS: 100.0000 mg | ORAL_TABLET | Freq: Every day | ORAL | Status: DC

## 2013-08-23 NOTE — Progress Notes (Signed)
  Jaime Barnes - 62 y.o. male MRN [id number]  Date of birth: [birth year]  CC, HPI, INTERVAL HISTORY & ROS  Jaime Barnes is here today for follow up of chronic medical conditions and for Finger Nail Removal.  Other chronic medical conditions include, HTN, BiPolar disorder, chronic headaches.    He reports not having gone to get the repeat scan of the chest.  He overall is doing significantly better and has been compliant with all of his medications.  He needs refills for all of his medicines.  Pt denies chest pain, dyspnea at rest or exertion, PND, lower extremity edema.  Patient denies any facial asymmetry, unilateral weakness, or dysarthria.  He reports his chronic headaches have been significantly improved since being on the Topamax and the verapamil  Pt denies any fevers, chills, or rigors.  He reports the fingernail draining from the base of the thumb.  Crack there has been a small amount of drainage from the base of the thumb.  There is no surrounding erythema. History  Jaime Barnes's major active medical problems include: # CVD: HTN, HLD, CAD without MI, ED CATH Jul 03, 2004 - Non Obstructive CAD # GI: Chronic Pancreatitis, GERD with Barrett's Esophagus, Alcoholic Cirrhosis, diverticulosis, Hemorrhoids Normal GI  # PSYCH: BiPolar disorder  # Substance Abuse: Tobacco Dependence, Hx of Alcohol Abuse, marijuana use  Other Pertinent Med/Surg/Hosp History: # Herpes Simplex - annual recurrence  # Breast pain - s/p normal mammogram - likely due to marijuana use # Anemia  Other Care Providers include:  Follow up Issues:  >PULMONARY NODULE - needs repeat chest CT (due March 2013)    Past Medical, Surgical, Social, and Family History Reviewed per EMR Medications and Allergies reviewed and all updated if necessary. Objective Findings  VITALS: HR: 71 bpm  BP: 128/76 mmHg  TEMP: 99.2 F (37.3 C) (Oral)  RESP:    HT: 5\' 6"  (167.6 cm)  WT: 181 lb (82.101 kg)  BMI: 29.3   BP Readings  from Last 3 Encounters:  08/23/13 128/76  07/29/13 111/77  04/26/13 123/74   Wt Readings from Last 3 Encounters:  08/23/13 181 lb (82.101 kg)  07/29/13 182 lb 1.6 oz (82.6 kg)  04/26/13 177 lb (80.287 kg)     PHYSICAL EXAM: GENERAL:  thin African American male. In no discomfort; no respiratory distress  PSYCH: alert and appropriate, good insight   HNEENT:   CARDIO:   LUNGS:   ABDOMEN:   EXTREM:  Warm, well perfused.  Moves all 4 extremities spontaneously; no lateralization.  Distal pulses 2+/4  No pretibial edema. Hyperkeratotic nail on the left thumb.  There is a indention in the middle that has crack and the nailbed is visible.    GU:   SKIN:     Assessment & Plan   Problems addressed today: General Plan & Pt Instructions:  1. Migraine headache   2. HYPERLIPIDEMIA   3. Pulmonary nodule seen on imaging study   4. Dystrophic nail       We need to treat you for 6 weeks to be sure this doesn't come back.  Please take the medication daily  Please go get your chest X-ray     For further discussion of A/P and for follow up issues see problem based charting if applicable.

## 2013-08-23 NOTE — Patient Instructions (Signed)
   We need to treat you for 6 weeks to be sure this doesn't come back.  Please take the medication daily  Please go get your chest X-ray   If you need anything prior to your next visit please call the clinic. Please Bring all medications or accurate medication list with you to each appointment; an accurate medication list is essential in providing you the best care possible.     Fingernail Removal Fingernails may need to be removed because of injury, infections, or correction of abnormal growth. A special non-stick bandage has been put on your finger tightly to prevent bleeding. Fingernails will usually grow back if the finger has not been badly injured and you carefully follow instructions. HOME CARE INSTRUCTIONS   Keep your hand elevated above your heart to relieve pain and swelling.  Keep your dressing dry and clean.  Change your bandage in 24 hours.  After your bandage is changed, soak your hand in warm soapy water for 10 to 20 minutes. Do this 3 times per day. This helps reduce pain and swelling. After soaking your hand, apply a clean, dry bandage. Change your bandage if it is wet or dirty.  Only take over-the-counter or prescription medicines for pain, discomfort, or fever as directed by your caregiver.  See your caregiver as needed for problems.  You may have received an instruction to follow up with your caregiver or a specialist. The failure to follow up as instructed could result in the permanent loss of a fingernail. SEEK IMMEDIATE MEDICAL CARE IF:   You have increased pain, swelling, drainage, or bleeding.  You have a fever. MAKE SURE YOU:   Understand these instructions.  Will watch your condition.  Will get help right away if you are not doing well or get worse. Document Released: 09/12/2000 Document Revised: 12/08/2011 Document Reviewed: 01/18/2008 [facility] Patient Information 2014 [place], [place].

## 2013-08-23 NOTE — Assessment & Plan Note (Signed)
Reordered CT today. To be arranged for patient.

## 2013-08-23 NOTE — Assessment & Plan Note (Signed)
PROCEDURE NOTE: Dystrophic Nail Removal - R 1st Finger, no infection Patient given informed consent, signed copy in the chart. Appropriate time out taken.  Large rubberband tightened with forceps used as tourniquet placed at the 1st MCP .  Area prepped and draped in clean  fashion.  Digital block done using 2% lidocaine without epinephrine, 4cc total.  Nail elevated using nail elevator, removed using hemostats and traction force.  Nail bed left intact.  No complications.  Minimal bleeding.  Vaseline guaze bandage applied and post procedure instructions given.  Patient tolerated procedure well without complications. Plan for 6 weeks of Lamisil.

## 2013-08-24 LAB — COMPREHENSIVE METABOLIC PANEL
Albumin: 4.5 g/dL (ref 3.5–5.2)
BUN: 13 mg/dL (ref 6–23)
Calcium: 9.6 mg/dL (ref 8.4–10.5)
Chloride: 109 mEq/L (ref 96–112)
Creat: 1.22 mg/dL (ref 0.50–1.35)
Glucose, Bld: 85 mg/dL (ref 70–99)
Potassium: 4.1 mEq/L (ref 3.5–5.3)

## 2013-08-24 NOTE — Assessment & Plan Note (Signed)
Refill verapamil and Topamax

## 2013-08-24 NOTE — Assessment & Plan Note (Signed)
Stable/Well Controlled - no changes at this time. 

## 2013-08-24 NOTE — Assessment & Plan Note (Signed)
Seems to be doing well on his current regimen. Refill current medications. Basic lab monitoring for medications

## 2013-09-02 ENCOUNTER — Ambulatory Visit (HOSPITAL_COMMUNITY)
Admission: RE | Admit: 2013-09-02 | Discharge: 2013-09-02 | Disposition: A | Discharge status: Home / Self Care | Payer: Medicaid Other | Source: Ambulatory Visit | Attending: Family Medicine | Admitting: Family Medicine

## 2013-09-02 DIAGNOSIS — R911 Solitary pulmonary nodule: Secondary | ICD-10-CM

## 2013-09-02 DIAGNOSIS — I7090 Unspecified atherosclerosis: Secondary | ICD-10-CM | POA: Insufficient documentation

## 2013-09-02 DIAGNOSIS — I2510 Atherosclerotic heart disease of native coronary artery without angina pectoris: Secondary | ICD-10-CM | POA: Insufficient documentation

## 2013-09-02 DIAGNOSIS — J984 Other disorders of lung: Secondary | ICD-10-CM | POA: Insufficient documentation

## 2013-09-02 DIAGNOSIS — R918 Other nonspecific abnormal finding of lung field: Secondary | ICD-10-CM | POA: Insufficient documentation

## 2013-09-02 MED ORDER — IOHEXOL 300 MG/ML  SOLN
80.0000 mL | Freq: Once | INTRAMUSCULAR | Status: AC | PRN
  Administered 2013-09-02: 80 mL via INTRAVENOUS

## 2013-09-06 ENCOUNTER — Telehealth: Payer: Self-pay | Admitting: *Deleted

## 2013-09-06 NOTE — Telephone Encounter (Signed)
Message copied by Farrell Ours on Tue Sep 06, 2013  8:50 AM ------      Message from: Gaspar Bidding D      Created: Mon Sep 05, 2013  7:35 PM       Repeat CT of the chest negative for worsening lung mass.      Liver findings suggestive of cirrhosis.  Patient needs to stop drinking alcohol as this is a direct effect of that.      > Please call patient and informed him there are no acute findings on his chest CT and that the lung mass appears to not be cancer.  Please encourage him to keep his followup as scheduled to further discuss his ongoing medical problems. ------

## 2013-09-06 NOTE — Telephone Encounter (Signed)
Spoke with patein and informed him of below

## 2013-10-28 ENCOUNTER — Other Ambulatory Visit: Payer: Self-pay | Admitting: Sports Medicine

## 2013-12-21 ENCOUNTER — Ambulatory Visit: Payer: Medicaid Other | Admitting: Sports Medicine

## 2014-01-16 ENCOUNTER — Encounter: Payer: Self-pay | Admitting: Sports Medicine

## 2014-01-16 ENCOUNTER — Ambulatory Visit (INDEPENDENT_AMBULATORY_CARE_PROVIDER_SITE_OTHER): Payer: Medicaid Other | Admitting: Sports Medicine

## 2014-01-16 VITALS — BP 132/79 | HR 62 | Temp 98.1°F | Ht 66.0 in | Wt 187.0 lb

## 2014-01-16 DIAGNOSIS — R911 Solitary pulmonary nodule: Secondary | ICD-10-CM

## 2014-01-16 DIAGNOSIS — I2510 Atherosclerotic heart disease of native coronary artery without angina pectoris: Secondary | ICD-10-CM

## 2014-01-16 DIAGNOSIS — G43909 Migraine, unspecified, not intractable, without status migrainosus: Secondary | ICD-10-CM

## 2014-01-16 DIAGNOSIS — M76829 Posterior tibial tendinitis, unspecified leg: Secondary | ICD-10-CM

## 2014-01-16 MED ORDER — TOPIRAMATE 50 MG PO TABS
50.0000 mg | ORAL_TABLET | Freq: Two times a day (BID) | ORAL | Status: DC
Start: 2014-01-16 — End: 2014-11-02

## 2014-01-16 MED ORDER — ALBUTEROL SULFATE HFA 108 (90 BASE) MCG/ACT IN AERS: 2.0000 | INHALATION_SPRAY | Freq: Four times a day (QID) | RESPIRATORY_TRACT | Status: DC | PRN

## 2014-01-16 MED ORDER — ATORVASTATIN CALCIUM 40 MG PO TABS: 40.0000 mg | ORAL_TABLET | Freq: Every day | ORAL | Status: DC

## 2014-01-16 MED ORDER — VERAPAMIL HCL 120 MG PO TABS: 120.0000 mg | ORAL_TABLET | Freq: Three times a day (TID) | ORAL | Status: DC

## 2014-01-16 MED ORDER — OMEPRAZOLE 20 MG PO CPDR: 20.0000 mg | DELAYED_RELEASE_CAPSULE | Freq: Every day | ORAL | Status: DC

## 2014-01-16 MED ORDER — LISINOPRIL-HYDROCHLOROTHIAZIDE 10-12.5 MG PO TABS: 1.0000 | ORAL_TABLET | Freq: Every day | ORAL | Status: DC

## 2014-01-16 MED ORDER — TRAZODONE HCL 100 MG PO TABS: 100.0000 mg | ORAL_TABLET | Freq: Every day | ORAL | Status: DC

## 2014-01-16 NOTE — Progress Notes (Signed)
Jaime Barnes - 63 y.o. male MRN [id number]  Date of birth: Apr 04, 1951  CC & SUBJECTIVE:      Chief Complaint  Patient presents with  . Medical Management of Chronic Issues    Including hypertension, hyperlipidemia, coronary artery disease, tobacco dependence Disease Monitoring:       if blank either not applicable or not asked Activity Level:  sedentary lifestyle   Dietary Compliance:  denies poor habits   Medication Compliance:  reports good compliance   Home BP Monitoring:  not performed   Substance Use:  continues to smoke    Disease Associated ROS: Pt reports no:  Chest pain, shortness of breath/DOE, orthopnea/PND, or LE swelling.   Tachypalpitations or orthostasis  New/worsening claudication or decreased functional ability New facial asymmetry, unilateral weakness, dysarthria or changes in vision Polyuria, polydipsia New or worsening paraesthesias or dysthesias in BLE New foot lesions.   . Right ankle pain   See problem based charting for additional problem specific subjective (including HPI, Interval History & ROS)   HISTORY: Jack's major active medical problems include: # CVD: HTN, HLD, CAD without MI, ED CATH Jul 03, 2004 - Non Obstructive CAD # GI: Chronic Pancreatitis, GERD with Barrett's Esophagus, Alcoholic Cirrhosis, diverticulosis, Hemorrhoids # PSYCH: BiPolar disorder # Substance Abuse: Tobacco Dependence, Hx of Alcohol Abuse, marijuana use  Other Pertinent Med/Surg/Hosp History: # Herpes Simplex - annual recurrence  # Breast pain - s/p normal mammogram - likely due to marijuana use # Anemia # Pulmonary Nodule: Last CT 09/05/2013 with stable 4 mm pulmonary nodules  Follow up Issues:   No results found for this basename: HGBA1C, TRIG, CHOL, HDL, LDLCALC, LDLDIRECT, TSH, T3FREE, FREET4, T3TOTAL, T4TOTAL, THYROIDAB,  in the last 8760 hours} Wt Readings from Last 3 Encounters:  01/16/14 187 lb (84.823 kg)  08/23/13 181 lb (82.101 kg)  07/29/13 182 lb 1.6 oz  (82.6 kg)   BP Readings from Last 3 Encounters:  01/16/14 132/79  08/23/13 128/76  07/29/13 111/77    History  Smoking status  . Current Every Day Smoker -- 0.25 packs/day  Smokeless tobacco  . Not on file    Comment: none in 2 days (6.21.13)   Health Maintenance Due  Topic  . Zostavax     Otherwise past Medical, Surgical, Social, and Family History Reviewed per EMR Medications and Allergies reviewed and updated per below.  VITALS: BP 132/79  Pulse 62  Temp(Src) 98.1 F (36.7 C) (Oral)  Ht 5\' 6"  (1.676 m)  Wt 187 lb (84.823 kg)  BMI 30.20 kg/m2  SpO2 100%  PHYSICAL EXAM: GENERAL:  adult Afro-American male. In no discomfort; no respiratory distress  PSYCH: alert and appropriate, good insight   HNEENT: mmm, no jvd  CARDIO: RRR, S1/S2 heard, no murmur  LUNGS: CTA B, no wheezes, no crackles  ABDOMEN:   EXTREM:  Warm, well perfused.  Moves all 4 extremities spontaneously; no lateralization.  Pedal pulses 1+/4.  No pretibial edema.  Right ankle Exam: Appear:  normal-appearing, no effusion   Palp:  moderate tenderness to palpation along the posterior tibialis tendon  No ankle mortise tenderness   ROM:  full passive range of motion, limited active due to pain   NV:   sensation grossly intact   Testing:  normal ankle drawer test, negative cliger and ankle tilt    MEDICATIONS, LABS & OTHER ORDERS: Previous Medications   SUMATRIPTAN (IMITREX) 50 MG TABLET    TAKE 1 TABLET BY MOUTH AS NEEDED FOR HEADACHE, MAY REPEAT  IN 2HRS. IF NEEDED   Modified Medications   Modified Medication Previous Medication   ALBUTEROL (PROVENTIL HFA) 108 (90 BASE) MCG/ACT INHALER albuterol (PROVENTIL HFA) 108 (90 BASE) MCG/ACT inhaler      Inhale 2 puffs into the lungs every 6 (six) hours as needed for wheezing.    Inhale 2 puffs into the lungs every 6 (six) hours as needed for wheezing.   ATORVASTATIN (LIPITOR) 40 MG TABLET atorvastatin (LIPITOR) 40 MG tablet      Take 1 tablet (40 mg total) by  mouth daily.    Take 1 tablet (40 mg total) by mouth daily.   LISINOPRIL-HYDROCHLOROTHIAZIDE (PRINZIDE,ZESTORETIC) 10-12.5 MG PER TABLET lisinopril-hydrochlorothiazide (PRINZIDE,ZESTORETIC) 10-12.5 MG per tablet      Take 1 tablet by mouth daily.    Take 1 tablet by mouth daily.   OMEPRAZOLE (PRILOSEC) 20 MG CAPSULE omeprazole (PRILOSEC) 20 MG capsule      Take 1 capsule (20 mg total) by mouth daily.    Take 1 capsule (20 mg total) by mouth daily.   TOPIRAMATE (TOPAMAX) 50 MG TABLET topiramate (TOPAMAX) 50 MG tablet      Take 1 tablet (50 mg total) by mouth 2 (two) times daily.    Take 1 tablet (50 mg total) by mouth 2 (two) times daily.   TRAZODONE (DESYREL) 100 MG TABLET traZODone (DESYREL) 100 MG tablet      Take 1 tablet (100 mg total) by mouth at bedtime.    Take 1 tablet (100 mg total) by mouth at bedtime.   VERAPAMIL (CALAN) 120 MG TABLET verapamil (CALAN) 120 MG tablet      Take 1 tablet (120 mg total) by mouth 3 (three) times daily.    Take 1 tablet (120 mg total) by mouth 3 (three) times daily.   New Prescriptions   No medications on file   Discontinued Medications   TERBINAFINE (LAMISIL) 250 MG TABLET    Take 1 tablet (250 mg total) by mouth daily.   TOPIRAMATE (TOPAMAX) 50 MG TABLET    TAKE 1 TABLET BY MOUTH TWICE DAILY  No orders of the defined types were placed in this encounter.   ASSESSMENT & PLAN: See problem based charting & AVS for pt instructions.

## 2014-01-16 NOTE — Patient Instructions (Addendum)
I have sent in refills for your medications. Please DD ankle exercises we discussed including drawing the ABCs with your foot at least once per day.  Worked her way up to 4 times per day.  If your ankle pain is not improved in the next 3-4 weeks please return to discuss this further.  Keep working on cutting down on your smoking.

## 2014-01-18 ENCOUNTER — Encounter: Payer: Self-pay | Admitting: Sports Medicine

## 2014-01-18 NOTE — Assessment & Plan Note (Signed)
Chronic, condition - No evidence of worsening functional status, currently on high potency statin - Blood pressure has been appropriately managed on his current regimen and I am hesitant to make any changes 1. Continue lisinopril hydrochlorothiazide and verapamil. 2. Encourage TLC (Therapeutic Lifestyle Changes) > Consider changing to beta blocker given known CAD but no prior MI  .

## 2014-01-18 NOTE — Assessment & Plan Note (Signed)
Problem Based Documentation:    Subjective Report:  Patient reports intermittent right ankle pain.  Is an ongoing issuefor him but had significantly improved previously.  Reports his right ankle will occasionally give way on him but he denies any significant falls.   no joint effusion but reports significant stiffness  Pt denies any fevers, chills, or rigors.  No weight loss     Assessment & Plan & Follow up Issues:  Acute on chronic condition Previously seen by podiatry but has not been doing any therapies, and exam consistent with posterior tibialis tendinitis 1. Recommend strengthening exercises for the ankle including ABCs.  These were reviewed with him today. 2. Ice encouraged > Recommended to followup if not improved and we can consider further evaluation with ultrasound > Consider referral to podiatry if patient requests a happy to return initially

## 2014-01-18 NOTE — Assessment & Plan Note (Signed)
Followup CT in December 2014 with 20 months of stability at 4 mm.  No further imaging recommended

## 2014-01-27 ENCOUNTER — Other Ambulatory Visit: Payer: Self-pay | Admitting: Family Medicine

## 2014-05-01 ENCOUNTER — Other Ambulatory Visit: Payer: Self-pay | Admitting: Sports Medicine

## 2014-05-02 ENCOUNTER — Other Ambulatory Visit: Payer: Self-pay | Admitting: *Deleted

## 2014-05-03 MED ORDER — ALBUTEROL SULFATE HFA 108 (90 BASE) MCG/ACT IN AERS: 2.0000 | INHALATION_SPRAY | Freq: Four times a day (QID) | RESPIRATORY_TRACT | Status: DC | PRN

## 2014-06-12 ENCOUNTER — Other Ambulatory Visit: Payer: Self-pay | Admitting: Sports Medicine

## 2014-07-03 ENCOUNTER — Other Ambulatory Visit: Payer: Self-pay | Admitting: Sports Medicine

## 2014-09-03 ENCOUNTER — Other Ambulatory Visit: Payer: Self-pay | Admitting: Sports Medicine

## 2014-10-10 ENCOUNTER — Encounter: Payer: Medicaid Other | Admitting: Family Medicine

## 2014-11-02 ENCOUNTER — Ambulatory Visit (HOSPITAL_COMMUNITY)
Admission: RE | Admit: 2014-11-02 | Discharge: 2014-11-02 | Disposition: A | Discharge status: Home / Self Care | Payer: Medicaid Other | Source: Ambulatory Visit | Attending: Family Medicine | Admitting: Family Medicine

## 2014-11-02 ENCOUNTER — Ambulatory Visit (INDEPENDENT_AMBULATORY_CARE_PROVIDER_SITE_OTHER): Payer: Medicaid Other | Admitting: Family Medicine

## 2014-11-02 ENCOUNTER — Encounter: Payer: Self-pay | Admitting: Family Medicine

## 2014-11-02 VITALS — BP 127/87 | HR 82 | Temp 97.8°F | Ht 66.0 in | Wt 168.9 lb

## 2014-11-02 DIAGNOSIS — R42 Dizziness and giddiness: Secondary | ICD-10-CM | POA: Insufficient documentation

## 2014-11-02 DIAGNOSIS — K219 Gastro-esophageal reflux disease without esophagitis: Secondary | ICD-10-CM

## 2014-11-02 DIAGNOSIS — R9431 Abnormal electrocardiogram [ECG] [EKG]: Secondary | ICD-10-CM | POA: Diagnosis not present

## 2014-11-02 DIAGNOSIS — I10 Essential (primary) hypertension: Secondary | ICD-10-CM

## 2014-11-02 DIAGNOSIS — E785 Hyperlipidemia, unspecified: Secondary | ICD-10-CM

## 2014-11-02 DIAGNOSIS — G43009 Migraine without aura, not intractable, without status migrainosus: Secondary | ICD-10-CM

## 2014-11-02 LAB — POCT GLYCOSYLATED HEMOGLOBIN (HGB A1C): Hemoglobin A1C: 5.8

## 2014-11-02 LAB — POCT HEMOGLOBIN: Hemoglobin: 12.7 g/dL — AB (ref 14.1–18.1)

## 2014-11-02 MED ORDER — ATORVASTATIN CALCIUM 40 MG PO TABS: 40.0000 mg | ORAL_TABLET | Freq: Every day | ORAL | Status: DC

## 2014-11-02 MED ORDER — OMEPRAZOLE 20 MG PO CPDR: 20.0000 mg | DELAYED_RELEASE_CAPSULE | Freq: Every day | ORAL | Status: DC

## 2014-11-02 MED ORDER — SUMATRIPTAN SUCCINATE 50 MG PO TABS: ORAL_TABLET | ORAL | Status: DC

## 2014-11-02 MED ORDER — LISINOPRIL-HYDROCHLOROTHIAZIDE 10-12.5 MG PO TABS: 1.0000 | ORAL_TABLET | Freq: Every day | ORAL | Status: DC

## 2014-11-02 MED ORDER — MECLIZINE HCL 32 MG PO TABS
32.0000 mg | ORAL_TABLET | Freq: Three times a day (TID) | ORAL | Status: DC | PRN
Start: 2014-11-02 — End: 2015-01-29

## 2014-11-02 MED ORDER — VERAPAMIL HCL 120 MG PO TABS: 120.0000 mg | ORAL_TABLET | Freq: Three times a day (TID) | ORAL | Status: DC

## 2014-11-02 MED ORDER — TOPIRAMATE 50 MG PO TABS: 50.0000 mg | ORAL_TABLET | Freq: Two times a day (BID) | ORAL | Status: DC

## 2014-11-02 NOTE — Patient Instructions (Signed)
We will call with your MRI results.  I sent in medication refills and a new medicine called meclizine which you can take as needed for your dizziness.  Please follow up with Dr. Raoul Pitch in 1 week.

## 2014-11-02 NOTE — Assessment & Plan Note (Addendum)
Dizziness/lightheadedness and weakness x1 month - ekg, H&H, A1c and orthostatics negative in office - will obtain MRI brain for possible cerebellar stroke

## 2014-11-09 ENCOUNTER — Encounter: Payer: Self-pay | Admitting: Family Medicine

## 2014-11-09 ENCOUNTER — Ambulatory Visit (INDEPENDENT_AMBULATORY_CARE_PROVIDER_SITE_OTHER): Payer: Medicaid Other | Admitting: Family Medicine

## 2014-11-09 VITALS — BP 127/70 | HR 82 | Temp 97.6°F | Ht 66.0 in | Wt 175.0 lb

## 2014-11-09 DIAGNOSIS — R42 Dizziness and giddiness: Secondary | ICD-10-CM

## 2014-11-09 NOTE — Patient Instructions (Addendum)
Thank you for coming in, today!  Keep taking meclizine for dizziness, up to three times per day. Don't take sumatriptan unless you're having headache. This medicine is for migraines. Keep taking your other medicines as directed. Get your MRI on the 17th. Come back to see Dr. Raoul Pitch after that.  Please feel free to call with any questions or concerns at any time, at [phone]. --Dr. Venetia Maxon

## 2014-11-09 NOTE — Progress Notes (Signed)
   Subjective:    Patient ID: Jaime Barnes, male    DOB: 12-12-1950, 64 y.o.   MRN: [id number]  HPI: Pt presents to clinic for f/u of dizziness. He was seen by Dr. Sherril Cong on 11/02/14; he had a fall and sudden dizziness at home, and has had dizziness off and on with ?weakness at times, for about 1 month. Dr. Sherril Cong was concerned for possible cerebellar stroke, and pt has MRI on 2/17 scheduled. Dr. Sherril Cong refilled his medications and prescribed meclizine as well as sumatriptan; pt initially states the meclizine has been helpful, but later states that the medicine he's been taking was the one of which he only got 10 pills, which was sumatriptan. He even later states he takes the meclizine at night and sumatriptan in the morning. Overall his symptoms appear to have been resolved with medication, but it is very unclear what he has been taking. Regardless, he states he now "feels fine," has had no further weakness or falls, and denies fever / chills, N/V, chest pain, SOB, cough, abdominal pain, or change in bowel or bladder habits.   Review of Systems: As above. Of note, pt does endorse mild coryza-type symptoms around the time of onset of his dizziness symptoms, about a month ago.     Objective:   Physical Exam BP 127/70 mmHg  Pulse 82  Temp(Src) 97.6 F (36.4 C) (Oral)  Ht 5\' 6"  (1.676 m)  Wt 175 lb (79.379 kg)  BMI 28.26 kg/m2 Gen: well-appearing adult male in NAD HEENT: [name]/AT, EOMI, PERRLA, MMM, TM's clear bilaterally  Nasal and posterior oropharyngeal mucosae normal Cardio: RRR, no murmur appreciated Pulm: CTAB, no wheezes Abd: soft, nontender, BS+ Neuro: alert, oriented, no gross focal deficits, strength 5/5 throughout extremities bilaterally  Gait / station normal  No frank cerebellar signs  No pronator drift  Pt able to maintain balance with eyes closed, feet together, and hands outstretched     Assessment & Plan:  63yo with dizziness, now resolved s/p implementation of meclizine and  sumatriptan - unclear what medications pt is actually taking, but clarified directions / instructions - advised pt to get MRI then to f/u with PCP Dr. Raoul Pitch - reviewed red flags that would prompt immediate presentation to the ED (further falls / weakness, signs of stroke or MI, etc)  Note FYI to Dr. Raoul Pitch  Emmaline Kluver, MD PGY-3, [facility] 11/09/2014, 7:33 PM

## 2014-11-09 NOTE — Progress Notes (Signed)
   Subjective:    Patient ID: Jaime Barnes, male    DOB: 07-03-1951, 64 y.o.   MRN: [id number]  HPI Pt presents for dizziness for the past 1 month. He reports lightheadedness which occurs randomly without standing or head motion but does seem to improve with lying down. He also has some weakness with these episodes. No headaches, fevers. Had a fall on Saturday which he says was because of the dizziness but he did not hit his head.    Review of Systems See HPI    Objective:   Physical Exam  Constitutional: He is oriented to person, place, and time. He appears well-developed and well-nourished. No distress.  HENT:  Head: Normocephalic and atraumatic.  Right Ear: External ear normal.  Left Ear: External ear normal.  Eyes: Conjunctivae are normal. Right eye exhibits no discharge. Left eye exhibits no discharge. No scleral icterus.  Neck: Normal range of motion. Neck supple.  Cardiovascular: Normal rate, regular rhythm, normal heart sounds and intact distal pulses.   No murmur heard. Pulmonary/Chest: Effort normal and breath sounds normal. No respiratory distress. He has no wheezes.  Abdominal: Soft. Bowel sounds are normal. He exhibits no distension. There is no tenderness.  Neurological: He is alert and oriented to person, place, and time. He has normal strength. No cranial nerve deficit or sensory deficit. Coordination abnormal. Gait normal.  Unable to maintain balance with eyes closed  Skin: Skin is warm and dry. He is not diaphoretic.  Psychiatric: He has a normal mood and affect. His behavior is normal.  Nursing note and vitals reviewed.         Assessment & Plan:

## 2014-11-17 ENCOUNTER — Ambulatory Visit (HOSPITAL_COMMUNITY): Admission: RE | Admit: 2014-11-17 | Payer: Medicaid Other | Source: Ambulatory Visit

## 2014-11-21 ENCOUNTER — Encounter: Payer: Self-pay | Admitting: *Deleted

## 2014-11-21 NOTE — Progress Notes (Signed)
Prior Authorization received from [place] for Sumatriptan. Per Medicaid there are quantity limits on medication.  Quantity prescribed #10.  Derl Barrow, RN  Derl Barrow, RN

## 2014-11-22 ENCOUNTER — Other Ambulatory Visit: Payer: Self-pay | Admitting: Family Medicine

## 2014-11-22 DIAGNOSIS — G43009 Migraine without aura, not intractable, without status migrainosus: Secondary | ICD-10-CM

## 2014-11-22 MED ORDER — SUMATRIPTAN SUCCINATE 50 MG PO TABS: ORAL_TABLET | ORAL | Status: DC

## 2014-11-30 ENCOUNTER — Other Ambulatory Visit: Payer: Self-pay | Admitting: Sports Medicine

## 2014-12-01 ENCOUNTER — Telehealth: Payer: Self-pay | Admitting: Family Medicine

## 2014-12-01 NOTE — Telephone Encounter (Signed)
Spoke with Threasa Beards at the pre service center about patient. I tried to authorize this procedure through [employer] and this procedure has been approved already with an exp. Date of 12/02/2014. Due to patient cancelling appointment a new authorization needs to be done. I cannot do a new one until this expires tomorrow. I have explained this to melanie and informed her that on Monday I will get this authorized for her before patient's appointment.Rockne Coons

## 2014-12-01 NOTE — Telephone Encounter (Signed)
Pt scheduled for MRI for Monday, needs authorization for [facility]

## 2014-12-04 ENCOUNTER — Ambulatory Visit (HOSPITAL_COMMUNITY)
Admission: RE | Admit: 2014-12-04 | Discharge: 2014-12-04 | Disposition: A | Discharge status: Home / Self Care | Payer: Medicaid Other | Source: Ambulatory Visit | Attending: Family Medicine | Admitting: Family Medicine

## 2014-12-04 ENCOUNTER — Telehealth: Payer: Self-pay | Admitting: Family Medicine

## 2014-12-04 DIAGNOSIS — Z9181 History of falling: Secondary | ICD-10-CM | POA: Insufficient documentation

## 2014-12-04 DIAGNOSIS — E785 Hyperlipidemia, unspecified: Secondary | ICD-10-CM | POA: Diagnosis not present

## 2014-12-04 DIAGNOSIS — K7460 Unspecified cirrhosis of liver: Secondary | ICD-10-CM | POA: Diagnosis not present

## 2014-12-04 DIAGNOSIS — G319 Degenerative disease of nervous system, unspecified: Secondary | ICD-10-CM | POA: Insufficient documentation

## 2014-12-04 DIAGNOSIS — N189 Chronic kidney disease, unspecified: Secondary | ICD-10-CM | POA: Diagnosis not present

## 2014-12-04 DIAGNOSIS — R42 Dizziness and giddiness: Secondary | ICD-10-CM | POA: Insufficient documentation

## 2014-12-04 NOTE — Telephone Encounter (Signed)
Pre-Cert center called and needs the patient to be certified the one they have expired. The patients appointment is at 12:00 today. Please call her at [phone]. jw

## 2014-12-04 NOTE — Telephone Encounter (Signed)
[id number]

## 2014-12-04 NOTE — Telephone Encounter (Signed)
   Expand All Collapse All   [id number]

## 2014-12-08 ENCOUNTER — Emergency Department (HOSPITAL_COMMUNITY)
Admission: EM | Admit: 2014-12-08 | Discharge: 2014-12-08 | Disposition: A | Discharge status: Home / Self Care | Payer: Medicaid Other | Attending: Emergency Medicine | Admitting: Emergency Medicine

## 2014-12-08 ENCOUNTER — Encounter (HOSPITAL_COMMUNITY): Payer: Self-pay | Admitting: Cardiology

## 2014-12-08 ENCOUNTER — Emergency Department (HOSPITAL_COMMUNITY): Payer: Medicaid Other

## 2014-12-08 DIAGNOSIS — Z720 Tobacco use: Secondary | ICD-10-CM | POA: Insufficient documentation

## 2014-12-08 DIAGNOSIS — F319 Bipolar disorder, unspecified: Secondary | ICD-10-CM | POA: Diagnosis not present

## 2014-12-08 DIAGNOSIS — M5431 Sciatica, right side: Secondary | ICD-10-CM | POA: Diagnosis not present

## 2014-12-08 DIAGNOSIS — Z8739 Personal history of other diseases of the musculoskeletal system and connective tissue: Secondary | ICD-10-CM | POA: Insufficient documentation

## 2014-12-08 DIAGNOSIS — N189 Chronic kidney disease, unspecified: Secondary | ICD-10-CM | POA: Diagnosis not present

## 2014-12-08 DIAGNOSIS — Z79899 Other long term (current) drug therapy: Secondary | ICD-10-CM | POA: Insufficient documentation

## 2014-12-08 DIAGNOSIS — E785 Hyperlipidemia, unspecified: Secondary | ICD-10-CM | POA: Insufficient documentation

## 2014-12-08 DIAGNOSIS — I129 Hypertensive chronic kidney disease with stage 1 through stage 4 chronic kidney disease, or unspecified chronic kidney disease: Secondary | ICD-10-CM | POA: Insufficient documentation

## 2014-12-08 DIAGNOSIS — M549 Dorsalgia, unspecified: Secondary | ICD-10-CM

## 2014-12-08 DIAGNOSIS — Z87438 Personal history of other diseases of male genital organs: Secondary | ICD-10-CM | POA: Insufficient documentation

## 2014-12-08 DIAGNOSIS — S39012A Strain of muscle, fascia and tendon of lower back, initial encounter: Secondary | ICD-10-CM | POA: Insufficient documentation

## 2014-12-08 DIAGNOSIS — M545 Low back pain: Secondary | ICD-10-CM | POA: Diagnosis present

## 2014-12-08 DIAGNOSIS — Z8719 Personal history of other diseases of the digestive system: Secondary | ICD-10-CM | POA: Insufficient documentation

## 2014-12-08 DIAGNOSIS — R350 Frequency of micturition: Secondary | ICD-10-CM | POA: Diagnosis not present

## 2014-12-08 DIAGNOSIS — Y9339 Activity, other involving climbing, rappelling and jumping off: Secondary | ICD-10-CM | POA: Insufficient documentation

## 2014-12-08 DIAGNOSIS — Z9889 Other specified postprocedural states: Secondary | ICD-10-CM | POA: Insufficient documentation

## 2014-12-08 DIAGNOSIS — Y998 Other external cause status: Secondary | ICD-10-CM | POA: Diagnosis not present

## 2014-12-08 DIAGNOSIS — X58XXXA Exposure to other specified factors, initial encounter: Secondary | ICD-10-CM | POA: Insufficient documentation

## 2014-12-08 DIAGNOSIS — Y9289 Other specified places as the place of occurrence of the external cause: Secondary | ICD-10-CM | POA: Diagnosis not present

## 2014-12-08 DIAGNOSIS — Z880 Allergy status to penicillin: Secondary | ICD-10-CM | POA: Insufficient documentation

## 2014-12-08 LAB — URINALYSIS, ROUTINE W REFLEX MICROSCOPIC
Glucose, UA: NEGATIVE mg/dL
Hgb urine dipstick: NEGATIVE
KETONES UR: 15 mg/dL — AB
Leukocytes, UA: NEGATIVE
NITRITE: NEGATIVE
PROTEIN: 30 mg/dL — AB
Specific Gravity, Urine: 1.023 (ref 1.005–1.030)
Urobilinogen, UA: 1 mg/dL (ref 0.0–1.0)
pH: 8 (ref 5.0–8.0)

## 2014-12-08 LAB — URINE MICROSCOPIC-ADD ON

## 2014-12-08 NOTE — ED Notes (Signed)
Pt reports right sided back pain that started yesterday. States he was having to climb in the driver side of his friends car and thinks he pulled a muscle

## 2014-12-08 NOTE — ED Provider Notes (Signed)
CSN: [id number]     Arrival date & time 12/08/14  0831 History   First MD Initiated Contact with Patient 12/08/14 0848     Chief Complaint  Patient presents with  . Back Pain     (Consider location/radiation/quality/duration/timing/severity/associated sxs/prior Treatment) The history is provided by the patient. No language interpreter was used.  Varnell Orvis is a 64 year old male with past medical history of Barrett's esophagus, cirrhosis, pancreatitis, pulmonary nodule, chronic kidney disease, hypertension, hyperlipidemia, bipolar disorder, diverticulosis presenting to the emergency department with right buttock pain that started yesterday. Patient reported that he was getting in and out of his friend's car through the driver side secondary to the passenger door not working-reported that he had to climb into the truck and inside the truck at least 4-6 times yesterday. Reported that last night she started to experience sharp shooting pain that is constant localized to the right buttock radiating down the upper aspect of the right leg, posterior aspect. Reported that he had not taken anything for the discomfort. Dated that the pain is worse with motion. Denied fall, injury, direct trauma, fever, chills, urinary and bowel incontinence, loss of sensation, numbness, tingling, chest pain, shortness of breath, difficulty breathing, fever, chills, sweats, heavy lifting, dysuria, hematuria, abdominal pain, nausea, vomiting. PCP Dr. Raoul Pitch  Past Medical History  Diagnosis Date  . Barrett's esophagus 2011    Recommend repeat EGD for surveillance in 2 yrs-[facility]  . Cirrhosis   . Pancreatitis   . Pulmonary nodule   . Chronic kidney disease   . Hypertension   . Hyperlipidemia   . Bipolar disorder   . Diverticulosis of colon   . Impotence   . GANGLION CYST, TENDON SHEATH 06/26/2010  . Pulmonary nodule seen on imaging study 02/06/2011    Followup CT in December 2014 with 20 months of stability at 4 mm.   No further imaging recommended    Past Surgical History  Procedure Laterality Date  . Cardiac catheterization  2005    Nonobstructive, <40%   History reviewed. No pertinent family history. History  Substance Use Topics  . Smoking status: Current Every Day Smoker -- 0.50 packs/day    Types: Cigarettes  . Smokeless tobacco: Not on file     Comment: none in 2 days (6.21.13)  . Alcohol Use: No     Comment: former drinker, denies since 2010    Review of Systems  Constitutional: Negative for fever and chills.  Respiratory: Negative for chest tightness and shortness of breath.   Cardiovascular: Negative for chest pain.  Gastrointestinal: Negative for nausea, vomiting and abdominal pain.  Genitourinary: Positive for frequency. Negative for dysuria and hematuria.  Musculoskeletal: Positive for back pain. Negative for myalgias, gait problem, neck pain and neck stiffness.  Neurological: Negative for weakness and numbness.      Allergies  Amoxicillin; Codeine phosphate; and Penicillins  Home Medications   Prior to Admission medications   Medication Sig Start Date End Date Taking? Authorizing Provider  albuterol (PROVENTIL HFA) 108 (90 BASE) MCG/ACT inhaler Inhale 2 puffs into the lungs every 6 (six) hours as needed for wheezing.  05/02/15  Renee A Kuneff, DO  atorvastatin (LIPITOR) 40 MG tablet Take 1 tablet (40 mg total) by mouth daily. 11/02/14   Frazier Richards, MD  gabapentin (NEURONTIN) 300 MG capsule  08/03/14   Historical Provider, MD  hydrOXYzine (ATARAX/VISTARIL) 25 MG tablet  07/30/14   Historical Provider, MD  lisinopril-hydrochlorothiazide (PRINZIDE,ZESTORETIC) 10-12.5 MG per tablet Take 1 tablet  by mouth daily. 11/02/14   Frazier Richards, MD  lisinopril-hydrochlorothiazide (PRINZIDE,ZESTORETIC) 10-12.5 MG per tablet TAKE 1 TABLET BY MOUTH EVERY DAY 11/30/14   Coral Spikes, DO  meclizine (ANTIVERT) 32 MG tablet Take 1 tablet (32 mg total) by mouth 3 (three) times daily as needed for  dizziness. 11/02/14   Frazier Richards, MD  omeprazole (PRILOSEC) 20 MG capsule Take 1 capsule (20 mg total) by mouth daily. 11/02/14   Frazier Richards, MD  PROAIR HFA 108 (90 BASE) MCG/ACT inhaler INHALE 2 PUFFS INTO THE LUNGS EVERY 6 HOURS AS NEEDED FOR WHEEZING 06/12/14   Renee A Kuneff, DO  PROVENTIL HFA 108 (90 BASE) MCG/ACT inhaler INHALE 2 PUFFS INTO THE LUNGS EVERY 6 HOURS AS NEEDED FOR WHEEZING 07/03/14   Renee A Kuneff, DO  risperiDONE (RISPERDAL) 2 MG tablet  08/03/14   Historical Provider, MD  SUMAtriptan (IMITREX) 50 MG tablet TAKE 1 TABLET BY MOUTH AS NEEDED FOR HEADACHE, MAY REPEAT IN 2HRS. IF NEEDED 11/22/14   Frazier Richards, MD  thiothixene (NAVANE) 2 MG capsule  08/03/14   Historical Provider, MD  topiramate (TOPAMAX) 50 MG tablet Take 1 tablet (50 mg total) by mouth 2 (two) times daily. 11/02/14   Frazier Richards, MD  traZODone (DESYREL) 100 MG tablet Take 1 tablet (100 mg total) by mouth at bedtime. 01/16/14   Gerda Diss, DO  verapamil (CALAN) 120 MG tablet Take 1 tablet (120 mg total) by mouth 3 (three) times daily. 11/02/14   Frazier Richards, MD   BP 112/70 mmHg  Pulse 63  Temp(Src) 98.4 F (36.9 C) (Oral)  Resp 18  Ht 5' (1.524 m)  Wt 175 lb (79.379 kg)  BMI 34.18 kg/m2  SpO2 99% Physical Exam  Constitutional: He is oriented to person, place, and time. He appears well-developed and well-nourished. No distress.  HENT:  Head: Normocephalic and atraumatic.  Mouth/Throat: Oropharynx is clear and moist. No oropharyngeal exudate.  Eyes: Conjunctivae and EOM are normal. Right eye exhibits no discharge. Left eye exhibits no discharge.  Neck: Normal range of motion. Neck supple. No tracheal deviation present.  Negative pain upon palpation to the c-spine  Cardiovascular: Normal rate, regular rhythm and normal heart sounds.   Pulses:      Radial pulses are 2+ on the right side, and 2+ on the left side.       Dorsalis pedis pulses are 2+ on the right side, and 2+ on the left side.  Cap refill  < 3 seconds  Pulmonary/Chest: Effort normal and breath sounds normal. No respiratory distress. He has no wheezes. He has no rales.  Abdominal: Soft. Bowel sounds are normal. He exhibits no distension. There is no tenderness. There is no rebound, no guarding and no CVA tenderness.  Musculoskeletal: Normal range of motion. He exhibits tenderness.       Lumbar back: He exhibits tenderness. He exhibits normal range of motion, no bony tenderness, no swelling, no edema, no deformity and no laceration.       Back:       Legs: Full ROM to upper and lower extremities without difficulty noted, negative ataxia noted.  Negative deformities identified to the spine. Tenderness upon palpation to the right buttock and posterior aspect of the right leg-upper thigh area following sciatic nerve distribution  Lymphadenopathy:    He has no cervical adenopathy.  Neurological: He is alert and oriented to person, place, and time. No cranial nerve deficit. He exhibits normal  muscle tone. Coordination normal.  Strength 5+/5+ to upper and lower extremities bilaterally with resistance applied, equal distribution noted Negative saddle paresthesias bilaterally Sensation intact with differentiation sharp and dull touch Gait proper, proper balance - negative sway, negative drift, negative step-offs  Skin: Skin is warm and dry. No rash noted. He is not diaphoretic. No erythema.  Psychiatric: He has a normal mood and affect. His behavior is normal. Thought content normal.  Nursing note and vitals reviewed.   ED Course  Procedures (including critical care time) Labs Review Labs Reviewed  URINALYSIS, ROUTINE W REFLEX MICROSCOPIC - Abnormal; Notable for the following:    Color, Urine AMBER (*)    Bilirubin Urine SMALL (*)    Ketones, ur 15 (*)    Protein, ur 30 (*)    All other components within normal limits  URINE MICROSCOPIC-ADD ON    Imaging Review Dg Lumbar Spine Complete  12/08/2014   CLINICAL DATA:  Back  pain, no known injury  EXAM: LUMBAR SPINE - COMPLETE 4+ VIEW  COMPARISON:  Lumbar MRI 10/08/2005  FINDINGS: Normal lumbar alignment. Negative for fracture or mass. Disc spaces are maintained without significant spurring. Atherosclerotic calcification in the aorta and iliacs without aneurysm. No pars defect.  IMPRESSION: No acute abnormality.   Electronically Signed   By: Franchot Gallo M.D.   On: 12/08/2014 09:27     EKG Interpretation None      MDM   Final diagnoses:  Back pain  Lumbosacral strain, initial encounter  Sciatica, right    Medications - No data to display  Filed Vitals:   12/08/14 0839  BP: 112/70  Pulse: 63  Temp: 98.4 F (36.9 C)  TempSrc: Oral  Resp: 18  Height: 5' (1.524 m)  Weight: 175 lb (79.379 kg)  SpO2: 99%   Urinalysis negative for hemoglobin, nitrites, leukocytes-negative findings of infection-negative findings for pyelonephritis or UTI. Small bilirubin, ketones and protein identified-patient does have history of chronic kidney disease. Plain film of lumbar spine noted acute abnormalities identified. This provider reviewed the patient's chart. Patient is CT abdomen and pelvis with and without contrast performed in 2011 with negative findings of aortic aneurysm-doubt dissection. Doubt cauda equina. Doubt epidural abscess. Doubt UTI. Doubt pyelonephritis. Patient presenting to the ED with right buttock pain with radiation down the upper aspect of the right leg, posteriorly-suspicion to be muscular pain secondary to pain upon palpation and pain with motion, cannot rule out possible sciatica. Negative focal neurological deficits. Pulses palpable and strong. Gait proper with-negative step-offs or sway. Strength intact with equal distribution. Patient stable, afebrile. Patient not septic appearing. Discharged patient. Referred patient to PCP and orthopedics. Discussed with patient to avoid any physical or strenuous activity. Discussed with patient to apply heat and  massage with icy hot ointment. Discussed with patient to closely monitor symptoms and if symptoms are to worsen or change to report back to the ED - strict return instructions given.  Patient agreed to plan of care, understood, all questions answered.   Jamse Mead, PA-C 12/08/14 1026  Merryl Hacker, MD 12/09/14 1035

## 2014-12-08 NOTE — Discharge Instructions (Signed)
Please call your doctor for a followup appointment within 24-48 hours. When you talk to your doctor please let them know that you were seen in the emergency department and have them acquire all of your records so that they can discuss the findings with you and formulate a treatment plan to fully care for your new and ongoing problems. Please follow-up with her primary care provider Please follow-up with orthopedics Please rest and stay hydrated Please avoid any physical or strenuous activity Please massage with icy hot ointment and apply heat for muscular relief Please avoid any heavy lifting Please continue to monitor symptoms closely and if symptoms are to worsen or change (fever greater than 101, chills, sweating, nausea, vomiting, chest pain, shortness of breathe, difficulty breathing, weakness, numbness, tingling, worsening or changes to pain pattern, fall, injury, loss of sensation, inability to control urine or bowel movements) please report back to the Emergency Department immediately.   Back Pain, Adult Low back pain is very common. About 1 in 5 people have back pain.The cause of low back pain is rarely dangerous. The pain often gets better over time.About half of people with a sudden onset of back pain feel better in just 2 weeks. About 8 in 10 people feel better by 6 weeks.  CAUSES Some common causes of back pain include:  Strain of the muscles or ligaments supporting the spine.  Wear and tear (degeneration) of the spinal discs.  Arthritis.  Direct injury to the back. DIAGNOSIS Most of the time, the direct cause of low back pain is not known.However, back pain can be treated effectively even when the exact cause of the pain is unknown.Answering your caregiver's questions about your overall health and symptoms is one of the most accurate ways to make sure the cause of your pain is not dangerous. If your caregiver needs more information, he or she may order lab work or imaging  tests (X-rays or MRIs).However, even if imaging tests show changes in your back, this usually does not require surgery. HOME CARE INSTRUCTIONS For many people, back pain returns.Since low back pain is rarely dangerous, it is often a condition that people can learn to [facility] their own.   Remain active. It is stressful on the back to sit or stand in one place. Do not sit, drive, or stand in one place for more than 30 minutes at a time. Take short walks on level surfaces as soon as pain allows.Try to increase the length of time you walk each day.  Do not stay in bed.Resting more than 1 or 2 days can delay your recovery.  Do not avoid exercise or work.Your body is made to move.It is not dangerous to be active, even though your back may hurt.Your back will likely heal faster if you return to being active before your pain is gone.  Pay attention to your body when you bend and lift. Many people have less discomfortwhen lifting if they bend their knees, keep the load close to their bodies,and avoid twisting. Often, the most comfortable positions are those that put less stress on your recovering back.  Find a comfortable position to sleep. Use a firm mattress and lie on your side with your knees slightly bent. If you lie on your back, put a pillow under your knees.  Only take over-the-counter or prescription medicines as directed by your caregiver. Over-the-counter medicines to reduce pain and inflammation are often the most helpful.Your caregiver may prescribe muscle relaxant drugs.These medicines help dull your pain  so you can more quickly return to your normal activities and healthy exercise.  Put ice on the injured area.  Put ice in a plastic bag.  Place a towel between your skin and the bag.  Leave the ice on for 15-20 minutes, 03-04 times a day for the first 2 to 3 days. After that, ice and heat may be alternated to reduce pain and spasms.  Ask your caregiver about trying back  exercises and gentle massage. This may be of some benefit.  Avoid feeling anxious or stressed.Stress increases muscle tension and can worsen back pain.It is important to recognize when you are anxious or stressed and learn ways to manage it.Exercise is a great option. SEEK MEDICAL CARE IF:  You have pain that is not relieved with rest or medicine.  You have pain that does not improve in 1 week.  You have new symptoms.  You are generally not feeling well. SEEK IMMEDIATE MEDICAL CARE IF:   You have pain that radiates from your back into your legs.  You develop new bowel or bladder control problems.  You have unusual weakness or numbness in your arms or legs.  You develop nausea or vomiting.  You develop abdominal pain.  You feel faint. Document Released: 09/15/2005 Document Revised: 03/16/2012 Document Reviewed: 01/17/2014 [employer] Patient Information 2015 [place], [place]. This information is not intended to replace advice given to you by your health care provider. Make sure you discuss any questions you have with your health care provider.

## 2014-12-30 ENCOUNTER — Other Ambulatory Visit: Payer: Self-pay | Admitting: Sports Medicine

## 2014-12-30 DIAGNOSIS — I10 Essential (primary) hypertension: Secondary | ICD-10-CM

## 2015-01-01 NOTE — Telephone Encounter (Signed)
This med refill came to [place], looks like it's your patient.  Can you take care of this?

## 2015-01-02 ENCOUNTER — Other Ambulatory Visit: Payer: Self-pay | Admitting: *Deleted

## 2015-01-02 NOTE — Telephone Encounter (Signed)
2nd request.  First request sent to Dr. Sherril Cong.  Derl Barrow, RN

## 2015-01-22 ENCOUNTER — Ambulatory Visit: Payer: Self-pay

## 2015-01-25 ENCOUNTER — Other Ambulatory Visit: Payer: Self-pay | Admitting: Sports Medicine

## 2015-01-25 NOTE — Telephone Encounter (Signed)
Is this ok to refill?  

## 2015-01-26 ENCOUNTER — Telehealth: Payer: Self-pay | Admitting: Family Medicine

## 2015-01-26 ENCOUNTER — Ambulatory Visit (INDEPENDENT_AMBULATORY_CARE_PROVIDER_SITE_OTHER): Payer: Medicaid Other

## 2015-01-26 VITALS — BP 141/83 | HR 60 | Resp 16 | Ht 66.0 in | Wt 170.0 lb

## 2015-01-26 DIAGNOSIS — M79673 Pain in unspecified foot: Secondary | ICD-10-CM

## 2015-01-26 DIAGNOSIS — B351 Tinea unguium: Secondary | ICD-10-CM

## 2015-01-26 NOTE — Telephone Encounter (Signed)
Received a phone message patient wanting xray results and that he is not feeling well and dizzy. I have never evaluated this patient or ordered xrays on him. The last xrays in our system are from [place] and was completed in the ED, therefore he would have been told those results then. He also had an MRI in early March from Dr. Sherril Cong. I attempted to call pt to figure out what he is referring to, but there was no answer. If he has not followed up after his MRI and/or he is still having symptoms, he needs to get evaluated by either our office for SDA or ED. Thank you.

## 2015-01-26 NOTE — Telephone Encounter (Signed)
Left voice message for pt to call nurse back regarding message from Dr. Raoul Pitch.  Derl Barrow, RN

## 2015-01-26 NOTE — Telephone Encounter (Signed)
Patient asks for x-ray results because he doesn't feel well, most of times he is dizzy. Please, follow up with Patient.

## 2015-01-26 NOTE — Progress Notes (Signed)
   Subjective:    Patient ID: Jaime Barnes, male    DOB: 09/28/1951, 64 y.o.   MRN: [id number]  HPI Comments: Pt presents for debridement of 10 elongated, crumbling, thickened toenails.     Review of Systems  All other systems reviewed and are negative.      Objective:   Physical Exam        Assessment & Plan:

## 2015-01-29 ENCOUNTER — Encounter: Payer: Self-pay | Admitting: Family Medicine

## 2015-01-29 ENCOUNTER — Ambulatory Visit (INDEPENDENT_AMBULATORY_CARE_PROVIDER_SITE_OTHER): Payer: Medicaid Other | Admitting: Family Medicine

## 2015-01-29 VITALS — BP 143/84 | HR 72 | Temp 97.8°F | Ht 66.0 in | Wt 173.3 lb

## 2015-01-29 DIAGNOSIS — I10 Essential (primary) hypertension: Secondary | ICD-10-CM | POA: Diagnosis not present

## 2015-01-29 DIAGNOSIS — Z760 Encounter for issue of repeat prescription: Secondary | ICD-10-CM | POA: Diagnosis not present

## 2015-01-29 DIAGNOSIS — R42 Dizziness and giddiness: Secondary | ICD-10-CM | POA: Diagnosis not present

## 2015-01-29 DIAGNOSIS — G43009 Migraine without aura, not intractable, without status migrainosus: Secondary | ICD-10-CM | POA: Diagnosis not present

## 2015-01-29 DIAGNOSIS — E785 Hyperlipidemia, unspecified: Secondary | ICD-10-CM

## 2015-01-29 DIAGNOSIS — K219 Gastro-esophageal reflux disease without esophagitis: Secondary | ICD-10-CM

## 2015-01-29 MED ORDER — VERAPAMIL HCL 120 MG PO TABS: 120.0000 mg | ORAL_TABLET | Freq: Three times a day (TID) | ORAL | Status: DC

## 2015-01-29 MED ORDER — TOPIRAMATE 50 MG PO TABS: 50.0000 mg | ORAL_TABLET | Freq: Two times a day (BID) | ORAL | Status: DC

## 2015-01-29 MED ORDER — LISINOPRIL-HYDROCHLOROTHIAZIDE 10-12.5 MG PO TABS: 1.0000 | ORAL_TABLET | Freq: Every day | ORAL | Status: DC

## 2015-01-29 MED ORDER — GABAPENTIN 300 MG PO CAPS: 300.0000 mg | ORAL_CAPSULE | Freq: Every day | ORAL | Status: DC

## 2015-01-29 MED ORDER — ATORVASTATIN CALCIUM 40 MG PO TABS: 40.0000 mg | ORAL_TABLET | Freq: Every day | ORAL | Status: DC

## 2015-01-29 MED ORDER — SUMATRIPTAN SUCCINATE 50 MG PO TABS: ORAL_TABLET | ORAL | Status: DC

## 2015-01-29 MED ORDER — ALBUTEROL SULFATE HFA 108 (90 BASE) MCG/ACT IN AERS: 2.0000 | INHALATION_SPRAY | Freq: Four times a day (QID) | RESPIRATORY_TRACT | Status: DC | PRN

## 2015-01-29 MED ORDER — OMEPRAZOLE 20 MG PO CPDR: 20.0000 mg | DELAYED_RELEASE_CAPSULE | Freq: Every day | ORAL | Status: DC

## 2015-01-29 MED ORDER — MECLIZINE HCL 32 MG PO TABS: 32.0000 mg | ORAL_TABLET | Freq: Three times a day (TID) | ORAL | Status: DC | PRN

## 2015-01-29 NOTE — Progress Notes (Signed)
   Subjective:    Patient ID: Jaime Barnes, male    DOB: 06-29-1951, 64 y.o.   MRN: [id number]  HPI 64 year old male with a complicated past medical history presents for a same day appointment requesting medication refill and test results.  Patient reports that he is in need of all of his medications.  He states that he is been out recently has no further refills.  Patient is also requesting his MRI results from February.  At that point in time he was experiencing dizziness and feelings of presyncope. MRI was obtained for further evaluation.    Patient reports he continues to have dizziness but this improves with use of meclizine.  He states and has a dizziness he also feels weak.  Review of Systems  Neurological: Positive for dizziness and weakness.      Objective:   Physical Exam Filed Vitals:   01/29/15 0852  BP: 143/84  Pulse: 72  Temp: 97.8 F (36.6 C)   Vital signs reviewed.  Exam: General: well appearing, NAD. Cardiovascular: RRR. No murmurs, rubs, or gallops. Respiratory: CTAB. No rales, rhonchi, or wheeze. Neuro: No focal deficits noted.      Assessment & Plan:  64 year old male with, past medical history presents for medication refill/test results. - Informed patient of negative MRI other than global atrophy and worsening cerebellar atrophy. - I believe the cerebellar atrophy account for his symptoms of dizziness.  It does not however count for his associated feelings of weakness. - I refilled his medications. Medications could certainly be playing a role as well.  - I encouraged patient to follow-up with his primary; This is a chronic issue and needs to be further addressed by his PCP.

## 2015-01-29 NOTE — Patient Instructions (Signed)
I have refilled your medications.  Regarding your dizziness, you need to see your Primary physician Dr. Raoul Pitch.  Take care  Dr. Lacinda Axon

## 2015-01-30 NOTE — Progress Notes (Signed)
HPI Presents today chief complaint of painful elongated toenails.  Objective: Pulses are palpable bilateral nails are thick, yellow dystrophic onychomycosis and painful palpation.   Assessment: Onychomycosis with pain in limb.  Plan: Treatment of nails in thickness and length as covered service secondary to pain.  

## 2015-01-30 NOTE — Telephone Encounter (Signed)
Tried calling pt back today regarding results. Voicemail box was full, unable to leave a message.  Derl Barrow, RN

## 2015-01-31 NOTE — Progress Notes (Signed)
I was preceptor for this office visit.  

## 2015-04-05 ENCOUNTER — Telehealth: Payer: Self-pay | Admitting: Family Medicine

## 2015-04-05 NOTE — Telephone Encounter (Signed)
Pt called and needs a referral to a GI center. jw

## 2015-04-06 NOTE — Telephone Encounter (Signed)
LM with male for patient to call back to our office. Cayci Mcnabb,CMA

## 2015-04-06 NOTE — Telephone Encounter (Signed)
Spoke with pt.  He received a letter in April about needing to call and schedule an appt.  He states that he was told we needed to call and schedule.  Called and spoke with [facility].  The letter states that he was due for a followup colonoscopy and needed to call, nothing was needed from [place].  LMOVM for pt to call [facility] and schedule appt and to speak with Caryl Pina. Jaime Barnes, Salome Spotted

## 2015-04-06 NOTE — Telephone Encounter (Signed)
What is this referral for? Please inquire this of the patient. Thanks!

## 2015-04-19 ENCOUNTER — Encounter: Payer: Self-pay | Admitting: Family Medicine

## 2015-04-19 ENCOUNTER — Ambulatory Visit (INDEPENDENT_AMBULATORY_CARE_PROVIDER_SITE_OTHER): Payer: Medicaid Other | Admitting: Family Medicine

## 2015-04-19 VITALS — BP 150/80 | HR 99 | Temp 98.1°F | Ht 66.0 in | Wt 173.0 lb

## 2015-04-19 DIAGNOSIS — R55 Syncope and collapse: Secondary | ICD-10-CM | POA: Insufficient documentation

## 2015-04-19 DIAGNOSIS — G4700 Insomnia, unspecified: Secondary | ICD-10-CM | POA: Diagnosis not present

## 2015-04-19 DIAGNOSIS — F17200 Nicotine dependence, unspecified, uncomplicated: Secondary | ICD-10-CM

## 2015-04-19 DIAGNOSIS — M79662 Pain in left lower leg: Secondary | ICD-10-CM | POA: Diagnosis not present

## 2015-04-19 DIAGNOSIS — M79661 Pain in right lower leg: Secondary | ICD-10-CM | POA: Diagnosis not present

## 2015-04-19 DIAGNOSIS — R42 Dizziness and giddiness: Secondary | ICD-10-CM

## 2015-04-19 DIAGNOSIS — M792 Neuralgia and neuritis, unspecified: Secondary | ICD-10-CM | POA: Insufficient documentation

## 2015-04-19 DIAGNOSIS — K7030 Alcoholic cirrhosis of liver without ascites: Secondary | ICD-10-CM | POA: Diagnosis not present

## 2015-04-19 DIAGNOSIS — Z720 Tobacco use: Secondary | ICD-10-CM

## 2015-04-19 LAB — CBC WITH DIFFERENTIAL/PLATELET
BASOS ABS: 0 10*3/uL (ref 0.0–0.1)
BASOS PCT: 0 % (ref 0–1)
Eosinophils Absolute: 0.1 10*3/uL (ref 0.0–0.7)
Eosinophils Relative: 1 % (ref 0–5)
HCT: 40.2 % (ref 39.0–52.0)
Hemoglobin: 13.5 g/dL (ref 13.0–17.0)
LYMPHS PCT: 18 % (ref 12–46)
Lymphs Abs: 2.7 10*3/uL (ref 0.7–4.0)
MCH: 30.1 pg (ref 26.0–34.0)
MCHC: 33.6 g/dL (ref 30.0–36.0)
MCV: 89.5 fL (ref 78.0–100.0)
MONO ABS: 1.5 10*3/uL — AB (ref 0.1–1.0)
MPV: 10.8 fL (ref 8.6–12.4)
Monocytes Relative: 10 % (ref 3–12)
Neutro Abs: 10.5 10*3/uL — ABNORMAL HIGH (ref 1.7–7.7)
Neutrophils Relative %: 71 % (ref 43–77)
Platelets: 248 10*3/uL (ref 150–400)
RBC: 4.49 MIL/uL (ref 4.22–5.81)
RDW: 14.3 % (ref 11.5–15.5)
WBC: 14.8 10*3/uL — ABNORMAL HIGH (ref 4.0–10.5)

## 2015-04-19 LAB — TSH: TSH: 1.877 u[IU]/mL (ref 0.350–4.500)

## 2015-04-19 LAB — VITAMIN B12: Vitamin B-12: 280 pg/mL (ref 211–911)

## 2015-04-19 MED ORDER — GABAPENTIN 300 MG PO CAPS: 300.0000 mg | ORAL_CAPSULE | Freq: Every day | ORAL | Status: DC

## 2015-04-19 NOTE — Assessment & Plan Note (Addendum)
Several month history of presyncope/syncopal episodes occurring more frequently now happening every other day concerning for cardiac etiology as no symptoms of seizures or completely losing consciousness.  MRI performed several months ago for same symptoms revealed no CVA but global atrophy of cerebellum which can be seen in chronic alcohol use, which he previously endorses.  An EKG without acute changes of ACS - MRI and previous alcohol abuse, concerning for chronic cerebellar disease causing symptoms versus malnutrition-  Check B-12 - Refer to cardiology for evaluation of persistent mobile atrial fibrillation-  Given history of alcohol abuse and current smoking - Echo scheduled for Monday - Check TSH, RPR, HIV, CBC, CMET - Advised to schedule ABI w/ Dr Valentina Lucks given symptoms of claudication as well as history of CAD and current smoking - Restart gabapentin 300 mg daily at bedtime for neuropathic symptoms and sleep

## 2015-04-19 NOTE — Patient Instructions (Signed)
It was great seeing you today.   I have order some labs today to check on your passing out. I will call you with the results.  I have referred you to cardiology for evaluation of your heart to check for abnormal rhythm.  I have also ordered an ultrasound of your heart Start Gabapentin 300mg  every night for sleep and numbness   Please bring all your medications to every doctors visit  Sign up for My Chart to have easy access to your labs results, and communication with your Primary care physician.  Next Appointment  Please make an appointment with Dr Valentina Lucks for ABI  Make appointment with Dr Teryl Lucy in 2 weeks to follow-up about syncope   I look forward to talking with you again at our next visit. If you have any questions or concerns before then, please call the clinic at [phone].  Take Care,   Dr Phill Myron

## 2015-04-19 NOTE — Progress Notes (Signed)
   Subjective:    Patient ID: Jaime Barnes, male    DOB: July 19, 1951, 64 y.o.   MRN: [id number]  Seen for Same day visit for   CC: Dizziness, numbness, and HA  He reports several episodes of presyncope/syncope for the past several months.  Each episode is described as dizziness, body numbness and weakness that causes him to collapse to the floor. He denies any CP, palpitations, unilateral weakness/numbess prior to events.  Denies loss of bowel or bladder incontinence during episodes.  His roommate is present and says he is still able to hear and respond to questions during these episodes, but seems to confused and weak.  Associates to last for a couple minutes to 10 minutes and then resolve without symptoms.  He denies chest pain, shortness of breath between episodes; our does endorse bilateral calf tightness with walking 2 blocks. Denies previous MI or CVA but has hx of CAD (CATH Jul 03, 2004 - Non Obstructive CAD).  Reports previous alcohol abuse and tightness of cirrhosis, but denies any alcohol use in the past several years.  He needs to endorse smoking one half pack per day, but denies marijuana or other illicit drug use.  Also endorses unilateral left-sided posterior headache that radiates down his neck associated with numbness.   Review of Systems   See HPI for ROS. Objective:  BP 150/80 mmHg  Pulse 99  Temp(Src) 98.1 F (36.7 C) (Oral)  Ht 5\' 6"  (1.676 m)  Wt 173 lb (78.472 kg)  BMI 27.94 kg/m2  SpO2 96%  General: NAD Cardiac: RRR, normal heart sounds, no murmurs. 2+ radial and PT pulses bilaterally Respiratory: CTAB, normal effort Abdomen: soft, nontender, nondistended, no hepatic or splenomegaly. Bowel sounds present Extremities: no edema or cyanosis. WWP. Skin: warm and dry, no rashes noted MSK: Muscle tension noted throughout left trapezius Neuro: alert and oriented, no focal deficits    Assessment & Plan:  See Problem List Documentation

## 2015-04-20 LAB — COMPREHENSIVE METABOLIC PANEL
ALT: 22 U/L (ref 0–53)
AST: 18 U/L (ref 0–37)
Albumin: 3.9 g/dL (ref 3.5–5.2)
Alkaline Phosphatase: 77 U/L (ref 39–117)
BUN: 16 mg/dL (ref 6–23)
CO2: 20 mEq/L (ref 19–32)
Calcium: 9.8 mg/dL (ref 8.4–10.5)
Chloride: 106 mEq/L (ref 96–112)
Creat: 1.14 mg/dL (ref 0.50–1.35)
GLUCOSE: 71 mg/dL (ref 70–99)
Potassium: 4 mEq/L (ref 3.5–5.3)
Sodium: 142 mEq/L (ref 135–145)
Total Bilirubin: 0.4 mg/dL (ref 0.2–1.2)
Total Protein: 6.8 g/dL (ref 6.0–8.3)

## 2015-04-20 LAB — PROTIME-INR
INR: 1.17 (ref <1.50)
PROTHROMBIN TIME: 14.9 s (ref 11.6–15.2)

## 2015-04-20 LAB — HIV ANTIBODY (ROUTINE TESTING W REFLEX): HIV 1&2 Ab, 4th Generation: NONREACTIVE

## 2015-04-20 LAB — RPR

## 2015-04-23 ENCOUNTER — Ambulatory Visit (HOSPITAL_COMMUNITY)
Admission: RE | Admit: 2015-04-23 | Discharge: 2015-04-23 | Disposition: A | Discharge status: Home / Self Care | Payer: Medicaid Other | Source: Ambulatory Visit | Attending: Family Medicine | Admitting: Family Medicine

## 2015-04-23 DIAGNOSIS — I517 Cardiomegaly: Secondary | ICD-10-CM | POA: Diagnosis not present

## 2015-04-23 DIAGNOSIS — I10 Essential (primary) hypertension: Secondary | ICD-10-CM | POA: Insufficient documentation

## 2015-04-23 DIAGNOSIS — F17200 Nicotine dependence, unspecified, uncomplicated: Secondary | ICD-10-CM | POA: Diagnosis not present

## 2015-04-23 DIAGNOSIS — E785 Hyperlipidemia, unspecified: Secondary | ICD-10-CM | POA: Insufficient documentation

## 2015-04-23 DIAGNOSIS — I272 Other secondary pulmonary hypertension: Secondary | ICD-10-CM | POA: Diagnosis not present

## 2015-04-23 DIAGNOSIS — R55 Syncope and collapse: Secondary | ICD-10-CM | POA: Diagnosis not present

## 2015-04-24 ENCOUNTER — Encounter: Payer: Self-pay | Admitting: Family Medicine

## 2015-04-24 ENCOUNTER — Ambulatory Visit: Payer: Medicaid Other | Admitting: Pharmacist

## 2015-04-24 ENCOUNTER — Telehealth: Payer: Self-pay | Admitting: Family Medicine

## 2015-04-24 NOTE — Telephone Encounter (Signed)
Called patient to discuss echo results, but unable to reach.  Telephone number goes to voicemail of someone with different name.  If he calls back please get best number, and time to reach him.

## 2015-04-24 NOTE — Telephone Encounter (Signed)
LM for patient to call back. Clairissa Valvano,CMA  

## 2015-04-26 ENCOUNTER — Encounter: Payer: Self-pay | Admitting: *Deleted

## 2015-04-27 ENCOUNTER — Ambulatory Visit: Payer: Medicaid Other

## 2015-04-30 NOTE — Telephone Encounter (Signed)
Pt has an appt with pcp on 05-03-15.  Can discuss echo results then. Nima Kemppainen,CMA

## 2015-05-01 ENCOUNTER — Ambulatory Visit (INDEPENDENT_AMBULATORY_CARE_PROVIDER_SITE_OTHER): Payer: Medicaid Other | Admitting: Podiatry

## 2015-05-01 ENCOUNTER — Encounter: Payer: Self-pay | Admitting: Podiatry

## 2015-05-01 DIAGNOSIS — B351 Tinea unguium: Secondary | ICD-10-CM

## 2015-05-01 DIAGNOSIS — M79673 Pain in unspecified foot: Secondary | ICD-10-CM | POA: Diagnosis not present

## 2015-05-01 NOTE — Progress Notes (Signed)
Patient ID: Jaime Barnes, male   DOB: 1950/12/28, 64 y.o.   MRN: [id number] Complaint:  Visit Type: Patient returns to my office for continued preventative foot care services. Complaint: Patient states" my nails have grown long and thick and become painful to walk and wear shoes" Patient has been diagnosed with DM with no foot complications. The patient presents for preventative foot care services. No changes to ROS  Podiatric Exam: Vascular: dorsalis pedis and posterior tibial pulses are palpable bilateral. Capillary return is immediate. Temperature gradient is WNL. Skin turgor WNL  Sensorium: Normal Semmes Weinstein monofilament test. Normal tactile sensation bilaterally. Nail Exam: Pt has thick disfigured discolored nails with subungual debris noted bilateral entire nail hallux through fifth toenails Ulcer Exam: There is no evidence of ulcer or pre-ulcerative changes or infection. Orthopedic Exam: Muscle tone and strength are WNL. No limitations in general ROM. No crepitus or effusions noted. Foot type and digits show no abnormalities. Bony prominences are unremarkable. Skin: No Porokeratosis. No infection or ulcers  Diagnosis:  Onychomycosis, , Pain in right toe, pain in left toes  Treatment & Plan Procedures and Treatment: Consent by patient was obtained for treatment procedures. The patient understood the discussion of treatment and procedures well. All questions were answered thoroughly reviewed. Debridement of mycotic and hypertrophic toenails, 1 through 5 bilateral and clearing of subungual debris. No ulceration, no infection noted.  Return Visit-Office Procedure: Patient instructed to return to the office for a follow up visit 3 months for continued evaluation and treatment.

## 2015-05-03 ENCOUNTER — Encounter: Payer: Self-pay | Admitting: Pharmacist

## 2015-05-03 ENCOUNTER — Ambulatory Visit (INDEPENDENT_AMBULATORY_CARE_PROVIDER_SITE_OTHER): Payer: Medicaid Other | Admitting: Family Medicine

## 2015-05-03 ENCOUNTER — Ambulatory Visit (INDEPENDENT_AMBULATORY_CARE_PROVIDER_SITE_OTHER): Payer: Medicaid Other | Admitting: Pharmacist

## 2015-05-03 ENCOUNTER — Encounter: Payer: Self-pay | Admitting: Family Medicine

## 2015-05-03 VITALS — BP 158/94 | HR 64 | Ht 67.76 in | Wt 172.2 lb

## 2015-05-03 VITALS — BP 157/81 | HR 78 | Temp 97.8°F | Ht 66.0 in | Wt 172.3 lb

## 2015-05-03 DIAGNOSIS — M79662 Pain in left lower leg: Secondary | ICD-10-CM | POA: Diagnosis not present

## 2015-05-03 DIAGNOSIS — I10 Essential (primary) hypertension: Secondary | ICD-10-CM

## 2015-05-03 DIAGNOSIS — M545 Low back pain, unspecified: Secondary | ICD-10-CM

## 2015-05-03 DIAGNOSIS — M79661 Pain in right lower leg: Secondary | ICD-10-CM | POA: Diagnosis not present

## 2015-05-03 DIAGNOSIS — K5900 Constipation, unspecified: Secondary | ICD-10-CM | POA: Diagnosis not present

## 2015-05-03 DIAGNOSIS — I2510 Atherosclerotic heart disease of native coronary artery without angina pectoris: Secondary | ICD-10-CM | POA: Diagnosis not present

## 2015-05-03 DIAGNOSIS — Z720 Tobacco use: Secondary | ICD-10-CM

## 2015-05-03 DIAGNOSIS — F17200 Nicotine dependence, unspecified, uncomplicated: Secondary | ICD-10-CM

## 2015-05-03 MED ORDER — CYCLOBENZAPRINE HCL 10 MG PO TABS: 10.0000 mg | ORAL_TABLET | Freq: Three times a day (TID) | ORAL | Status: DC | PRN

## 2015-05-03 MED ORDER — IBUPROFEN 600 MG PO TABS: 600.0000 mg | ORAL_TABLET | Freq: Three times a day (TID) | ORAL | Status: DC | PRN

## 2015-05-03 MED ORDER — LISINOPRIL-HYDROCHLOROTHIAZIDE 20-25 MG PO TABS: 1.0000 | ORAL_TABLET | Freq: Every day | ORAL | Status: DC

## 2015-05-03 MED ORDER — POLYETHYLENE GLYCOL 3350 17 G PO PACK: 17.0000 g | PACK | Freq: Two times a day (BID) | ORAL | Status: DC

## 2015-05-03 NOTE — Progress Notes (Signed)
Patient ID: Jaime Barnes, male   DOB: July 03, 1951, 64 y.o.   MRN: [id number] Reviewed: Agree with Dr. Graylin Shiver documentation and management.

## 2015-05-03 NOTE — Patient Instructions (Addendum)
Thank you for coming to see me today. It was a pleasure. Today we talked about:   I am increasing your lisinopril-hctz to 20mg -25mg  daily. Please return for a lab check in about 1 month  Please call the cardiologist's office for follow-up  For your back and leg, I am prescribing a muscle relaxant and some ibuprofen as this might be related to inflammation in your joint or spasm in your leg (or both) If symptoms worsen, please return  Constipation: I am prescribing you Miralax to take twice daily  Cardiologist's Office Phone Number: [phone]  Please make an appointment to see me in 3 months for follow-up.  If you have any questions or concerns, please do not hesitate to call the office at [phone].  Sincerely,  Cordelia Poche, MD

## 2015-05-03 NOTE — Progress Notes (Addendum)
S:    Patient arrives in good spirits, unaccompanied, walking without assistance however he complains of right hip and lateral thigh pain which started in his sleep two days ago. He presents to the clinic for PADABI evaluation.   Reports pain with walking.  Pain is described as tightness and burning which occurs after walking ~ "2 blocks".  Denies pain starting while at rest or standing still. Reports pain worsens when walking up hill or in a hurry. Reports pain when walking at an ordinary pace on a level surface   Reports pain/tightness resolves on sitting after 20-30 minutes.  Pain is localized to calf/lower leg bilaterally. Jenetta Downer:  Lower extremity Physical Exam includes: diminished pulses.   bilaterally  ABI overall = > 1. Right Arm 148 mmHg    Left Arm 140 mmHg Right ankle posterior tibial 150 mmHg     dorsalis pedis 82 mmHg Left ankle posterior tibial 150 mmHg    dorsalis pedis 148 mmHg   A/P: Normal ABI with diminished peripheral leg pulses.  Classic symptoms of reproducible pain with walking and location of pain in calves bilaterally remains concerning for PAD.  Normal ABI may be the result of calcification.   Hypertension - elevated Blood pressure today in office - possibly related to pain in right leg.   Instructed to take additional dose of lisinopril/HCTZ (10/12.5) today at lunch and have his blood pressure reevaluated at his visit later today with Dr. Lonny Prude.  Results reviewed and written information provided.   Total time in face-to-face counseling 25 minutes.  Patient seen with Stephens November, PharmD Resident.  Chronic tobacco abuse - quit for a period of time then relapsed to smoking in late 2015.   He has been cutting down on his smoking - currently at 3 cigs per day.  He plans quit date later this month.   He was uninterested in additional support at this time.  Encouraged quitting.  Reassess progress with tobacco cessation at next visit.

## 2015-05-03 NOTE — Assessment & Plan Note (Signed)
Hypertension - elevated Blood pressure today in office - possibly related to pain in right leg.   Instructed to take additional dose of lisinopril/HCTZ (10/12.5) today at lunch and have his blood pressure reevaluated at his visit later today with Dr. Lonny Prude.  Results reviewed and written information provided.   Total time in face-to-face counseling 25 minutes.  Patient seen with Stephens November, PharmD Resident.

## 2015-05-03 NOTE — Assessment & Plan Note (Signed)
Normal ABI with diminished peripheral leg pulses.  Classic symptoms of reproducible pain with walking and location of pain in calves bilaterally remains concerning for PAD.  Normal ABI may be the result of calcification.

## 2015-05-03 NOTE — Assessment & Plan Note (Signed)
Chronic tobacco abuse - quit for a period of time then relapsed to smoking in late 2015.   He has been cutting down on his smoking - currently at 3 cigs per day.  He plans quit date later this month.   He was uninterested in additional support at this time.  Encouraged quitting.  Reassess progress with tobacco cessation at next visit

## 2015-05-03 NOTE — Progress Notes (Signed)
    Subjective    Jaime Barnes is a 64 y.o. male that presents for a follow-up visit for chronic issues.   1. Heart disease: Possible history of stent placement but he is unsure. He has a recent history. Gabapentin appears to be helping with symptoms of syncope although he says he still sometimes feels lightheaded. No chest pain or shortness of breath. No orthopnea or PND.  2. Leg pain: Occurs when he walks a few blocks and especially when walking up a hill. Stopping helps his pain. Present on both legs with associated warmth. No swelling.   3. Constipation: He usually has bowel movements twice per day. Last bowel movement was 3 days ago. He reports passing gas. He has tried [employer] which has not helped. He has some RLQ abdominal  4. Back pain: Symptoms started yesterday when he got up from bed. Pain radiates from back to right knee. He has tried Advil which did not help with the pain. Pain is described as throbbing/achy pain. He reports walking and sitting up straight makes it hurt worse. Lying on the bed on his stomach and a hot shower helps with the pain.  5. Hypertension: patient adherent with lisinopril-hctz. He has taken two pills today. No chest pain.  History  Substance Use Topics  . Smoking status: Current Every Day Smoker -- 0.50 packs/day    Types: Cigarettes  . Smokeless tobacco: Not on file     Comment: none in 2 days (6.21.13)  . Alcohol Use: No     Comment: former drinker, denies since 2010    Allergies  Allergen Reactions  . Codeine Phosphate Other (See Comments)    REACTION: breathing difficulty  . Penicillins Rash and Other (See Comments)    Break out in sweats/ Rash with amoxicillin. Denies airway involvement.    Meds ordered this encounter  Medications  . cyclobenzaprine (FLEXERIL) 10 MG tablet    Sig: Take 1 tablet (10 mg total) by mouth 3 (three) times daily as needed for muscle spasms.    Dispense:  30 tablet    Refill:  0  . ibuprofen (ADVIL,MOTRIN)  600 MG tablet    Sig: Take 1 tablet (600 mg total) by mouth every 8 (eight) hours as needed.    Dispense:  10 tablet    Refill:  0  . lisinopril-hydrochlorothiazide (PRINZIDE,ZESTORETIC) 20-25 MG per tablet    Sig: Take 1 tablet by mouth daily.    Dispense:  30 tablet    Refill:  2  . polyethylene glycol (MIRALAX) packet    Sig: Take 17 g by mouth 2 (two) times daily. Until daily soft stools, then decrease to once per day.    Dispense:  30 each    Refill:  0    ROS  Per HPI   Objective   BP 157/81 mmHg  Pulse 78  Temp(Src) 97.8 F (36.6 C) (Oral)  Ht 5\' 6"  (1.676 m)  Wt 172 lb 4.8 oz (78.155 kg)  BMI 27.82 kg/m2  General: Well appearing, no distress Cardiovascular: Regular rate and rhythm, no murmur. 1+ DP pulses bilaterally Gastrointestinal: Soft, mild suprapubic tenderness, normal bowel sounds, no rebound or guarding Extremities: Tenderness along left SI joint and hamstring. No calf tenderness  Assessment and Plan   Please refer to problem based charting of assessment and plan

## 2015-05-03 NOTE — Patient Instructions (Addendum)
Thank you for coming to see [place] today!  Take another lisinopril/hydrochlorothiazide today before returning to the clinic this afternoon.  Good luck with quitting smoking!

## 2015-05-06 DIAGNOSIS — M549 Dorsalgia, unspecified: Secondary | ICD-10-CM | POA: Insufficient documentation

## 2015-05-06 DIAGNOSIS — K5900 Constipation, unspecified: Secondary | ICD-10-CM | POA: Insufficient documentation

## 2015-05-06 NOTE — Assessment & Plan Note (Signed)
Do not suspect obstruction.  miralax BID until soft daily stools

## 2015-05-06 NOTE — Assessment & Plan Note (Signed)
Symptoms consistent with claudication even with normal ABIs. Patient will be following up with cardiology.

## 2015-05-06 NOTE — Assessment & Plan Note (Signed)
Possible SI joint pain with associated hamstring pain. Does not appear neuropathic  Short course of ibuprofen 600mg   Flexeril

## 2015-05-06 NOTE — Assessment & Plan Note (Signed)
Increase to lisinopril-hctz 20-25mg  daily

## 2015-05-17 ENCOUNTER — Encounter: Payer: Self-pay | Admitting: Cardiology

## 2015-05-29 NOTE — Progress Notes (Signed)
Cardiology Office Note   Date:  05/30/2015   ID:  Jaime Barnes, DOB [birth year], MRN [id number]  PCP:  Cordelia Poche, MD  Cardiologist:   Sharol Harness, MD   Chief Complaint  Patient presents with  . Claudication    pain in legs when walking- relieved when he stops to rest for ~10 minutes   . Hypertension    no lightheadedness since changing blood pressure medications      History of Present Illness: Jaime Barnes is a 64 y.o. male with hypertension and hyperlipidemia who presents for an evaluation of claudication.  Jaime Barnes was evaluated by his PCP's office on 8/7 with symptoms concerning for claudication.  He reports one month of leg cramping and pain when walking up an incline. He has no pain when walking down hill or on flat land. When he stops and rests for 10 or 15 minutes the pain goes away and he is able to resume walking. He denies any chest pain, shortness of breath, nausea, vomiting, diaphoresis, lightheadedness, dizziness or palpitations. He also denies any lower extremity edema. He has not been on any recent car rides or plane trips.  He underwent ABI testing that was negative for occlusive disease.  He was referred to cardiology due to suspicion that this was a false negative.  Jaime Barnes on 05/06/15.  Past Medical History  Diagnosis Date  . Barrett's esophagus 2011    Recommend repeat EGD for surveillance in 2 yrs-[facility]  . Cirrhosis   . Pancreatitis   . Pulmonary nodule   . Chronic kidney disease   . Hypertension   . Hyperlipidemia   . Bipolar disorder   . Diverticulosis of colon   . Impotence   . GANGLION CYST, TENDON SHEATH 06/26/2010  . Pulmonary nodule seen on imaging study 02/06/2011    Followup CT in December 2014 with 20 months of stability at 4 mm.  No further imaging recommended     Past Surgical History  Procedure Laterality Date  . Cardiac catheterization  2005    Nonobstructive, <40%     Current Outpatient  Prescriptions  Medication Sig Dispense Refill  . albuterol (PROVENTIL HFA) 108 (90 BASE) MCG/ACT inhaler Inhale 2 puffs into the lungs every 6 (six) hours as needed for wheezing. 1 Inhaler 0  . atorvastatin (LIPITOR) 40 MG tablet Take 1 tablet (40 mg total) by mouth daily. 90 tablet 1  . cyclobenzaprine (FLEXERIL) 10 MG tablet Take 1 tablet (10 mg total) by mouth 3 (three) times daily as needed for muscle spasms. 30 tablet 0  . gabapentin (NEURONTIN) 300 MG capsule Take 1 capsule (300 mg total) by mouth at bedtime. (Patient taking differently: Take 300 mg by mouth daily. ) 32 capsule 1  . hydrOXYzine (ATARAX/VISTARIL) 25 MG tablet at bedtime as needed.   2  . ibuprofen (ADVIL,MOTRIN) 600 MG tablet Take 1 tablet (600 mg total) by mouth every 8 (eight) hours as needed. 10 tablet 0  . lisinopril-hydrochlorothiazide (PRINZIDE,ZESTORETIC) 20-25 MG per tablet Take 1 tablet by mouth daily. 30 tablet 2  . meclizine (ANTIVERT) 32 MG tablet Take 1 tablet (32 mg total) by mouth 3 (three) times daily as needed for dizziness. (Patient taking differently: Take 32 mg by mouth at bedtime as needed for dizziness. ) 90 tablet 0  . omeprazole (PRILOSEC) 20 MG capsule Take 1 capsule (20 mg total) by mouth daily. 90 capsule 1  . polyethylene glycol (MIRALAX) packet Take 17 g  by mouth 2 (two) times daily. Until daily soft stools, then decrease to once per day. 30 each 0  . risperiDONE (RISPERDAL) 2 MG tablet at bedtime.   2  . SUMAtriptan (IMITREX) 50 MG tablet TAKE 1 TABLET BY MOUTH AS NEEDED FOR HEADACHE, MAY REPEAT IN 2HRS. IF NEEDED 6 tablet 3  . thiothixene (NAVANE) 2 MG capsule   2  . topiramate (TOPAMAX) 50 MG tablet Take 1 tablet (50 mg total) by mouth 2 (two) times daily. (Patient taking differently: Take 50 mg by mouth 2 (two) times daily as needed. ) 180 tablet 1  . traZODone (DESYREL) 100 MG tablet Take 1 tablet (100 mg total) by mouth at bedtime. 90 tablet 1  . verapamil (CALAN) 120 MG tablet Take 1 tablet  (120 mg total) by mouth 3 (three) times daily. (Patient taking differently: Take 120 mg by mouth daily. ) 90 tablet 1   No current facility-administered medications for this visit.    Allergies:   Codeine phosphate and Penicillins    Social History:  The patient  reports that he quit smoking about 2 weeks ago. His smoking use included Cigarettes. He has a 25 pack-year smoking history. He does not have any smokeless tobacco history on file. He reports that he uses illicit drugs (Marijuana). He reports that he does not drink alcohol.   Family History:  The patient's family history is not on file.    ROS:  Please see the history of present illness.   Otherwise, review of systems are positive for none.   All other systems are reviewed and negative.    PHYSICAL EXAM: VS:  BP 132/84 mmHg  Pulse 59  Ht 5\' 7"  (1.702 m)  Wt 79.878 kg (176 lb 1.6 oz)  BMI 27.57 kg/m2 , BMI Body mass index is 27.57 kg/(m^2). GENERAL:  Well appearing HEENT:  Pupils equal round and reactive, fundi not visualized, oral mucosa unremarkable NECK:  No jugular venous distention, waveform within normal limits, carotid upstroke brisk and symmetric, no bruits, no thyromegaly LYMPHATICS:  No cervical adenopathy LUNGS:  Clear to auscultation bilaterally HEART:  RRR.  PMI not displaced or sustained,S1 and S2 within normal limits, no S3, no S4, no clicks, no rubs, no murmurs ABD:  Flat, positive bowel sounds normal in frequency in pitch, no bruits, no rebound, no guarding, no midline pulsatile mass, no hepatomegaly, no splenomegaly EXT:  2 plus pulses throughout, no edema, no cyanosis no clubbing SKIN:  No rashes no nodules.  No hair on the lower legs from the calves to the ankles. NEURO:  Cranial nerves II through XII grossly intact, motor grossly intact throughout PSYCH:  Cognitively intact, oriented to person place and time    EKG:  EKG is ordered today. The ekg ordered today demonstrates sinus bradycardia at 59  bpm.  TTE 12/03/07: LVEF 55-65%.  Trace MR.  LHC 07/03/04: 40% mid LAD, 30% mid LCx, 40% mid RCA, 20% distal RCA.  LV gram LVEF 50%.  Exercise Myoview 12/03/07: Exercised 7 minutes on Bruce protocol.  8 METS.  Adequate HR response.  No infarct or inducible ischemia.  Recent Labs: 04/19/2015: ALT 22; BUN 16; Creat 1.14; Hemoglobin 13.5; Platelets 248; Potassium 4.0; Sodium 142; TSH 1.877    Lipid Panel    Component Value Date/Time   CHOL 105 11/27/2011 0919   TRIG 205* 11/27/2011 0919   HDL 26* 11/27/2011 0919   CHOLHDL 4.0 11/27/2011 0919   VLDL 41* 11/27/2011 0919   LDLCALC 38  11/27/2011 0919   LDLDIRECT 156* 06/09/2011 1555      Wt Readings from Last 3 Encounters:  05/30/15 79.878 kg (176 lb 1.6 oz)  05/03/15 78.155 kg (172 lb 4.8 oz)  05/03/15 78.109 kg (172 lb 3.2 oz)      Other studies Reviewed: Additional studies/ records that were reviewed today include: . Review of the above records demonstrates:  Please see elsewhere in the note.    ASSESSMENT AND PLAN:  # Claudication:  Mr. Purk story is concerning for claudication. However he had a normal ABI test. Therefore we will proceed with exercise ABIs as the prior tests could've been a false negative in the setting of calcified arteries or symptoms that are elicited only with exertion. - exercise ABI - continue ASA 81mg  daily  # HTN: BP well-controlled.  Continue lisinopril, HCTZ, and verapamil  # Hyperlipidemia: Lipids managed with PCP.  Continue atorvastatin.  # CV Disease prevention: Agree with taking ASA 81mg  daily  # Tobacco cessation: Patient quit smoking 05/17/15.  He was congratulated on his efforts.   Current medicines are reviewed at length with the patient today.  The patient does not have concerns regarding medicines.  The following changes have been made:  no change  Labs/ tests ordered today include: Exercise ABIs   Orders Placed This Encounter  Procedures  . EKG 12-Lead     Disposition:   FU  with Dr. Jonelle Sidle C. Oval Linsey in 1 year.    Signed, Sharol Harness, MD  05/30/2015 1:24 PM    [facility]

## 2015-05-30 ENCOUNTER — Ambulatory Visit: Payer: Medicaid Other | Admitting: Cardiovascular Disease

## 2015-05-30 ENCOUNTER — Ambulatory Visit (INDEPENDENT_AMBULATORY_CARE_PROVIDER_SITE_OTHER): Payer: Medicaid Other | Admitting: Cardiovascular Disease

## 2015-05-30 ENCOUNTER — Encounter: Payer: Self-pay | Admitting: Cardiovascular Disease

## 2015-05-30 VITALS — BP 132/84 | HR 59 | Ht 67.0 in | Wt 176.1 lb

## 2015-05-30 DIAGNOSIS — I739 Peripheral vascular disease, unspecified: Secondary | ICD-10-CM

## 2015-05-30 DIAGNOSIS — I10 Essential (primary) hypertension: Secondary | ICD-10-CM

## 2015-05-30 NOTE — Patient Instructions (Signed)
Your physician has requested that you have an exercise ankle brachial index (ABIs). During this test an ultrasound and blood pressure cuff are used to evaluate the arteries that supply the arms and legs with blood. Allow thirty minutes for this exam. There are no restrictions or special instructions.  Dr Oval Linsey recommends that you schedule a follow-up appointment in 1 year. You will receive a reminder letter in the mail two months in advance. If you don't receive a letter, please call our office to schedule the follow-up appointment.

## 2015-06-01 ENCOUNTER — Other Ambulatory Visit: Payer: Self-pay | Admitting: Family Medicine

## 2015-06-11 ENCOUNTER — Ambulatory Visit (HOSPITAL_COMMUNITY)
Admission: RE | Admit: 2015-06-11 | Discharge: 2015-06-11 | Disposition: A | Discharge status: Home / Self Care | Payer: Medicaid Other | Source: Ambulatory Visit | Attending: Cardiology | Admitting: Cardiology

## 2015-06-11 ENCOUNTER — Other Ambulatory Visit: Payer: Self-pay | Admitting: Cardiovascular Disease

## 2015-06-11 DIAGNOSIS — I739 Peripheral vascular disease, unspecified: Secondary | ICD-10-CM

## 2015-06-19 ENCOUNTER — Telehealth: Payer: Self-pay | Admitting: *Deleted

## 2015-06-19 NOTE — Telephone Encounter (Signed)
LEFT DETAIL MESSAGE ON VOICEMAIL ANY QUESTION MAY CALL BACK.

## 2015-06-19 NOTE — Telephone Encounter (Signed)
-----   Message from Skeet Latch, MD sent at 06/15/2015 10:25 PM EDT ----- Moderate plaque in the leg arteries.  Continue statin and start aspirin to prevent worsening disease.

## 2015-07-03 ENCOUNTER — Encounter (HOSPITAL_COMMUNITY): Payer: Self-pay | Admitting: Emergency Medicine

## 2015-07-03 ENCOUNTER — Emergency Department (HOSPITAL_COMMUNITY)
Admission: EM | Admit: 2015-07-03 | Discharge: 2015-07-03 | Disposition: A | Discharge status: Home / Self Care | Payer: Medicare Other | Attending: Emergency Medicine | Admitting: Emergency Medicine

## 2015-07-03 DIAGNOSIS — N189 Chronic kidney disease, unspecified: Secondary | ICD-10-CM | POA: Insufficient documentation

## 2015-07-03 DIAGNOSIS — R112 Nausea with vomiting, unspecified: Secondary | ICD-10-CM | POA: Diagnosis not present

## 2015-07-03 DIAGNOSIS — Z880 Allergy status to penicillin: Secondary | ICD-10-CM | POA: Insufficient documentation

## 2015-07-03 DIAGNOSIS — F319 Bipolar disorder, unspecified: Secondary | ICD-10-CM | POA: Insufficient documentation

## 2015-07-03 DIAGNOSIS — Z87891 Personal history of nicotine dependence: Secondary | ICD-10-CM | POA: Diagnosis not present

## 2015-07-03 DIAGNOSIS — I129 Hypertensive chronic kidney disease with stage 1 through stage 4 chronic kidney disease, or unspecified chronic kidney disease: Secondary | ICD-10-CM | POA: Insufficient documentation

## 2015-07-03 DIAGNOSIS — G43009 Migraine without aura, not intractable, without status migrainosus: Secondary | ICD-10-CM

## 2015-07-03 DIAGNOSIS — R51 Headache: Secondary | ICD-10-CM | POA: Diagnosis not present

## 2015-07-03 DIAGNOSIS — R519 Headache, unspecified: Secondary | ICD-10-CM

## 2015-07-03 DIAGNOSIS — Z79899 Other long term (current) drug therapy: Secondary | ICD-10-CM | POA: Insufficient documentation

## 2015-07-03 MED ORDER — METOCLOPRAMIDE HCL 5 MG/ML IJ SOLN
10.0000 mg | Freq: Once | INTRAMUSCULAR | Status: AC
  Administered 2015-07-03: 10 mg via INTRAVENOUS
  Filled 2015-07-03: qty 2

## 2015-07-03 MED ORDER — DIPHENHYDRAMINE HCL 50 MG/ML IJ SOLN
25.0000 mg | Freq: Once | INTRAMUSCULAR | Status: AC
  Administered 2015-07-03: 25 mg via INTRAVENOUS
  Filled 2015-07-03: qty 1

## 2015-07-03 MED ORDER — DEXAMETHASONE SODIUM PHOSPHATE 10 MG/ML IJ SOLN
10.0000 mg | Freq: Once | INTRAMUSCULAR | Status: AC
  Administered 2015-07-03: 10 mg via INTRAVENOUS
  Filled 2015-07-03: qty 1

## 2015-07-03 MED ORDER — SUMATRIPTAN SUCCINATE 50 MG PO TABS: ORAL_TABLET | ORAL | Status: DC

## 2015-07-03 NOTE — ED Notes (Signed)
Pt reports migraine headache for the last 2-3 days. Pt states is ex has been stressing him out which is usually his trigger. Pt states imitrex normally resolves headache. Pt awake, alert, oriented x4, ambulatory, NAD at present.

## 2015-07-03 NOTE — Discharge Instructions (Signed)

## 2015-07-03 NOTE — ED Provider Notes (Signed)
CSN: [id number]     Arrival date & time 07/03/15  1020 History   First MD Initiated Contact with Patient 07/03/15 1104     Chief Complaint  Patient presents with  . Migraine     (Consider location/radiation/quality/duration/timing/severity/associated sxs/prior Treatment) Patient is a 64 y.o. male presenting with headaches.  Headache Pain location:  R parietal Quality:  Dull Radiates to:  Does not radiate Onset quality:  Gradual Duration:  2 days Timing:  Intermittent Progression:  Unchanged Chronicity:  New Similar to prior headaches: yes   Context comment:  Stress of ex wife Relieved by:  Nothing Worsened by:  Light and sound Associated symptoms: nausea and vomiting   Associated symptoms: no abdominal pain     Past Medical History  Diagnosis Date  . Barrett's esophagus 2011    Recommend repeat EGD for surveillance in 2 yrs-[facility]  . Cirrhosis ([place])   . Pancreatitis   . Pulmonary nodule   . Chronic kidney disease   . Hypertension   . Hyperlipidemia   . Bipolar disorder ([place])   . Diverticulosis of colon   . Impotence   . GANGLION CYST, TENDON SHEATH 06/26/2010  . Pulmonary nodule seen on imaging study 02/06/2011    Followup CT in December 2014 with 20 months of stability at 4 mm.  No further imaging recommended    Past Surgical History  Procedure Laterality Date  . Cardiac catheterization  2005    Nonobstructive, <40%   History reviewed. No pertinent family history. Social History  Substance Use Topics  . Smoking status: Former Smoker -- 0.50 packs/day for 50 years    Types: Cigarettes    Quit date: 05/16/2015  . Smokeless tobacco: None     Comment: none in 2 days (6.21.13)  . Alcohol Use: No     Comment: former drinker, denies since 2010    Review of Systems  Gastrointestinal: Positive for nausea and vomiting. Negative for abdominal pain.  Neurological: Positive for headaches.  All other systems reviewed and are negative.     Allergies  Codeine  phosphate and Penicillins  Home Medications   Prior to Admission medications   Medication Sig Start Date End Date Taking? Authorizing Provider  albuterol (PROVENTIL HFA) 108 (90 BASE) MCG/ACT inhaler Inhale 2 puffs into the lungs every 6 (six) hours as needed for wheezing. 01/29/15   Coral Spikes, DO  atorvastatin (LIPITOR) 40 MG tablet Take 1 tablet (40 mg total) by mouth daily. 01/29/15   Coral Spikes, DO  cyclobenzaprine (FLEXERIL) 10 MG tablet TAKE 1 TABLET BY MOUTH THREE TIMES DAILY AS NEEDED FOR MUSCLE SPASMS 06/01/15   Mariel Aloe, MD  gabapentin (NEURONTIN) 300 MG capsule Take 1 capsule (300 mg total) by mouth at bedtime. Patient taking differently: Take 300 mg by mouth daily.  04/19/15   Olam Idler, MD  hydrOXYzine (ATARAX/VISTARIL) 25 MG tablet at bedtime as needed.  07/30/14   Historical Provider, MD  ibuprofen (ADVIL,MOTRIN) 600 MG tablet Take 1 tablet (600 mg total) by mouth every 8 (eight) hours as needed. 05/03/15   Mariel Aloe, MD  lisinopril-hydrochlorothiazide (PRINZIDE,ZESTORETIC) 20-25 MG per tablet Take 1 tablet by mouth daily. 05/03/15   Mariel Aloe, MD  meclizine (ANTIVERT) 32 MG tablet Take 1 tablet (32 mg total) by mouth 3 (three) times daily as needed for dizziness. Patient taking differently: Take 32 mg by mouth at bedtime as needed for dizziness.  01/29/15   Coral Spikes, DO  omeprazole (PRILOSEC) 20 MG capsule Take 1 capsule (20 mg total) by mouth daily. 01/29/15   Coral Spikes, DO  polyethylene glycol (MIRALAX) packet Take 17 g by mouth 2 (two) times daily. Until daily soft stools, then decrease to once per day. 05/03/15   Mariel Aloe, MD  risperiDONE (RISPERDAL) 2 MG tablet at bedtime.  08/03/14   Historical Provider, MD  SUMAtriptan (IMITREX) 50 MG tablet TAKE 1 TABLET BY MOUTH AS NEEDED FOR HEADACHE, MAY REPEAT IN 2HRS. IF NEEDED 07/03/15   Debby Freiberg, MD  thiothixene (NAVANE) 2 MG capsule  08/03/14   Historical Provider, MD  topiramate (TOPAMAX) 50 MG tablet Take  1 tablet (50 mg total) by mouth 2 (two) times daily. Patient taking differently: Take 50 mg by mouth 2 (two) times daily as needed.  01/29/15   Coral Spikes, DO  traZODone (DESYREL) 100 MG tablet Take 1 tablet (100 mg total) by mouth at bedtime. 01/16/14   Gerda Diss, MD  verapamil (CALAN) 120 MG tablet Take 1 tablet (120 mg total) by mouth 3 (three) times daily. Patient taking differently: Take 120 mg by mouth daily.  01/29/15   Jayce G Cook, DO   BP 111/77 mmHg  Pulse 50  Temp(Src) 97.5 F (36.4 C) (Oral)  Resp 16  SpO2 100% Physical Exam  Constitutional: He is oriented to person, place, and time. He appears well-developed and well-nourished.  HENT:  Head: Normocephalic and atraumatic.  Eyes: Conjunctivae and EOM are normal.  Neck: Normal range of motion. Neck supple.  Cardiovascular: Normal rate, regular rhythm and normal heart sounds.   Pulmonary/Chest: Effort normal and breath sounds normal. No respiratory distress.  Abdominal: He exhibits no distension. There is no tenderness. There is no rebound and no guarding.  Musculoskeletal: Normal range of motion.  Neurological: He is alert and oriented to person, place, and time. He has normal strength and normal reflexes. No cranial nerve deficit or sensory deficit. Gait normal.  Skin: Skin is warm and dry.  Vitals reviewed.   ED Course  Procedures (including critical care time) Labs Review Labs Reviewed - No data to display  Imaging Review No results found. I have personally reviewed and evaluated these images and lab results as part of my medical decision-making.   EKG Interpretation None      MDM   Final diagnoses:  Acute nonintractable headache, unspecified headache type    64 y.o. male with pertinent PMH of migraines presents with recurrent migraine, identical to prior. On arrival vital signs and physical exam as above. No focal neurodeficits.  Relieved with reglan, benadryl, decadron.  DC home in stable  condition  I have reviewed all laboratory and imaging studies if ordered as above  1. Acute nonintractable headache, unspecified headache type   2. Migraine without aura and without status migrainosus, not intractable         Debby Freiberg, MD 07/04/15 1322

## 2015-07-03 NOTE — ED Notes (Signed)
Pt is in stable condition upon d/c and ambulates from ED. 

## 2015-07-06 ENCOUNTER — Other Ambulatory Visit: Payer: Self-pay | Admitting: Family Medicine

## 2015-07-06 DIAGNOSIS — G43009 Migraine without aura, not intractable, without status migrainosus: Secondary | ICD-10-CM

## 2015-07-06 MED ORDER — SUMATRIPTAN SUCCINATE 50 MG PO TABS: ORAL_TABLET | ORAL | Status: DC

## 2015-07-06 NOTE — Telephone Encounter (Signed)
Done

## 2015-07-06 NOTE — Telephone Encounter (Signed)
Pt informed. Fleeger, Jessica Dawn  

## 2015-07-06 NOTE — Telephone Encounter (Signed)
Patient calling and asking for refill on his Sumatriptan.  Having bad migraines.

## 2015-07-07 ENCOUNTER — Encounter (HOSPITAL_COMMUNITY): Payer: Self-pay | Admitting: Emergency Medicine

## 2015-07-07 DIAGNOSIS — Z880 Allergy status to penicillin: Secondary | ICD-10-CM | POA: Insufficient documentation

## 2015-07-07 DIAGNOSIS — N189 Chronic kidney disease, unspecified: Secondary | ICD-10-CM | POA: Diagnosis not present

## 2015-07-07 DIAGNOSIS — Z9889 Other specified postprocedural states: Secondary | ICD-10-CM | POA: Diagnosis not present

## 2015-07-07 DIAGNOSIS — I129 Hypertensive chronic kidney disease with stage 1 through stage 4 chronic kidney disease, or unspecified chronic kidney disease: Secondary | ICD-10-CM | POA: Insufficient documentation

## 2015-07-07 DIAGNOSIS — M542 Cervicalgia: Secondary | ICD-10-CM | POA: Insufficient documentation

## 2015-07-07 DIAGNOSIS — G43909 Migraine, unspecified, not intractable, without status migrainosus: Secondary | ICD-10-CM | POA: Diagnosis not present

## 2015-07-07 DIAGNOSIS — Z720 Tobacco use: Secondary | ICD-10-CM | POA: Diagnosis not present

## 2015-07-07 DIAGNOSIS — Z8719 Personal history of other diseases of the digestive system: Secondary | ICD-10-CM | POA: Diagnosis not present

## 2015-07-07 DIAGNOSIS — Z79899 Other long term (current) drug therapy: Secondary | ICD-10-CM | POA: Insufficient documentation

## 2015-07-07 DIAGNOSIS — E785 Hyperlipidemia, unspecified: Secondary | ICD-10-CM | POA: Insufficient documentation

## 2015-07-07 DIAGNOSIS — R51 Headache: Secondary | ICD-10-CM | POA: Diagnosis present

## 2015-07-07 DIAGNOSIS — F319 Bipolar disorder, unspecified: Secondary | ICD-10-CM | POA: Diagnosis not present

## 2015-07-07 NOTE — ED Notes (Signed)
Pt. reports migraine headache onset yesterday with photophobia unrelieved by prescription Imitrex , denies nausea or vomitting / no fever .

## 2015-07-08 ENCOUNTER — Emergency Department (HOSPITAL_COMMUNITY)
Admission: EM | Admit: 2015-07-08 | Discharge: 2015-07-08 | Discharge status: Against Medical Advice | Payer: Medicare Other | Attending: Emergency Medicine | Admitting: Emergency Medicine

## 2015-07-08 DIAGNOSIS — G43909 Migraine, unspecified, not intractable, without status migrainosus: Secondary | ICD-10-CM

## 2015-07-08 MED ORDER — METOCLOPRAMIDE HCL 5 MG/ML IJ SOLN
10.0000 mg | Freq: Once | INTRAMUSCULAR | Status: AC
  Administered 2015-07-08: 10 mg via INTRAVENOUS
  Filled 2015-07-08: qty 2

## 2015-07-08 MED ORDER — SODIUM CHLORIDE 0.9 % IV SOLN
1000.0000 mL | INTRAVENOUS | Status: DC
  Administered 2015-07-08: 1000 mL via INTRAVENOUS

## 2015-07-08 MED ORDER — SODIUM CHLORIDE 0.9 % IV SOLN
1000.0000 mL | Freq: Once | INTRAVENOUS | Status: AC
  Administered 2015-07-08: 1000 mL via INTRAVENOUS

## 2015-07-08 MED ORDER — PROCHLORPERAZINE EDISYLATE 5 MG/ML IJ SOLN
10.0000 mg | Freq: Once | INTRAMUSCULAR | Status: AC
Start: 2015-07-08 — End: 2015-07-08
  Administered 2015-07-08: 10 mg via INTRAVENOUS
  Filled 2015-07-08: qty 2

## 2015-07-08 MED ORDER — DEXAMETHASONE SODIUM PHOSPHATE 10 MG/ML IJ SOLN
10.0000 mg | Freq: Once | INTRAMUSCULAR | Status: AC
  Administered 2015-07-08: 10 mg via INTRAVENOUS
  Filled 2015-07-08: qty 1

## 2015-07-08 MED ORDER — KETOROLAC TROMETHAMINE 30 MG/ML IJ SOLN
30.0000 mg | Freq: Once | INTRAMUSCULAR | Status: AC
  Administered 2015-07-08: 30 mg via INTRAVENOUS
  Filled 2015-07-08: qty 1

## 2015-07-08 MED ORDER — DIPHENHYDRAMINE HCL 50 MG/ML IJ SOLN
25.0000 mg | Freq: Once | INTRAMUSCULAR | Status: AC
  Administered 2015-07-08: 25 mg via INTRAVENOUS
  Filled 2015-07-08: qty 1

## 2015-07-08 NOTE — ED Provider Notes (Signed)
CSN: [id number]     Arrival date & time 07/07/15  2352 History  By signing my name below, I, Irene Pap, attest that this documentation has been prepared under the direction and in the presence of Delora Fuel, MD. Electronically Signed: Irene Pap, ED Scribe. 07/08/2015. 1:06 AM.  Chief Complaint  Patient presents with  . Migraine   The history is provided by the patient. No language interpreter was used.  HPI Comments: Jaime Barnes is a 64 y.o. Male with a hx of HTN who presents to the Emergency Department complaining of sharp, stabbing, gradually worsening, right sided headache onset one day ago. Pt reports associated photophobia that worsens his pain, tension in his neck, and stinging eye pain. He currently rates his pain "34/10." He states that this pain feels similar to his past migraines and has been seen in the ED 4 days ago. He states "I came here to get the shots for pain because my pain keeps coming every day." Pt has tried prescribed Imitrex for his pain to no relief. Pt denies rhinorrhea, fever, nausea, and vomiting.   Past Medical History  Diagnosis Date  . Barrett's esophagus 2011    Recommend repeat EGD for surveillance in 2 yrs-[facility]  . Cirrhosis ([place])   . Pancreatitis   . Pulmonary nodule   . Chronic kidney disease   . Hypertension   . Hyperlipidemia   . Bipolar disorder ([place])   . Diverticulosis of colon   . Impotence   . GANGLION CYST, TENDON SHEATH 06/26/2010  . Pulmonary nodule seen on imaging study 02/06/2011    Followup CT in December 2014 with 20 months of stability at 4 mm.  No further imaging recommended    Past Surgical History  Procedure Laterality Date  . Cardiac catheterization  2005    Nonobstructive, <40%   No family history on file. Social History  Substance Use Topics  . Smoking status: Current Every Day Smoker -- 0.00 packs/day for 50 years    Types: Cigarettes    Last Attempt to Quit: 05/16/2015  . Smokeless tobacco: None     Comment:  none in 2 days (6.21.13)  . Alcohol Use: Yes    Review of Systems  Constitutional: Negative for fever.  Eyes: Positive for photophobia and pain.  Gastrointestinal: Negative for nausea and vomiting.  Musculoskeletal: Positive for neck pain.  Neurological: Positive for headaches.  All other systems reviewed and are negative.  Allergies  Codeine phosphate and Penicillins  Home Medications   Prior to Admission medications   Medication Sig Start Date End Date Taking? Authorizing Provider  albuterol (PROVENTIL HFA) 108 (90 BASE) MCG/ACT inhaler Inhale 2 puffs into the lungs every 6 (six) hours as needed for wheezing. 01/29/15   Coral Spikes, DO  atorvastatin (LIPITOR) 40 MG tablet Take 1 tablet (40 mg total) by mouth daily. 01/29/15   Coral Spikes, DO  cyclobenzaprine (FLEXERIL) 10 MG tablet TAKE 1 TABLET BY MOUTH THREE TIMES DAILY AS NEEDED FOR MUSCLE SPASMS 06/01/15   Mariel Aloe, MD  gabapentin (NEURONTIN) 300 MG capsule Take 1 capsule (300 mg total) by mouth at bedtime. Patient taking differently: Take 300 mg by mouth daily.  04/19/15   Olam Idler, MD  hydrOXYzine (ATARAX/VISTARIL) 25 MG tablet at bedtime as needed.  07/30/14   Historical Provider, MD  ibuprofen (ADVIL,MOTRIN) 600 MG tablet Take 1 tablet (600 mg total) by mouth every 8 (eight) hours as needed. 05/03/15   Mariel Aloe,  MD  lisinopril-hydrochlorothiazide (PRINZIDE,ZESTORETIC) 20-25 MG per tablet Take 1 tablet by mouth daily. 05/03/15   Mariel Aloe, MD  meclizine (ANTIVERT) 32 MG tablet Take 1 tablet (32 mg total) by mouth 3 (three) times daily as needed for dizziness. Patient taking differently: Take 32 mg by mouth at bedtime as needed for dizziness.  01/29/15   Coral Spikes, DO  omeprazole (PRILOSEC) 20 MG capsule Take 1 capsule (20 mg total) by mouth daily. 01/29/15   Coral Spikes, DO  polyethylene glycol (MIRALAX) packet Take 17 g by mouth 2 (two) times daily. Until daily soft stools, then decrease to once per day. 05/03/15    Mariel Aloe, MD  risperiDONE (RISPERDAL) 2 MG tablet at bedtime.  08/03/14   Historical Provider, MD  SUMAtriptan (IMITREX) 50 MG tablet TAKE 1 TABLET BY MOUTH AS NEEDED FOR HEADACHE, MAY REPEAT IN 2HRS. IF NEEDED 07/06/15   Janora Norlander, DO  thiothixene (NAVANE) 2 MG capsule  08/03/14   Historical Provider, MD  topiramate (TOPAMAX) 50 MG tablet Take 1 tablet (50 mg total) by mouth 2 (two) times daily. Patient taking differently: Take 50 mg by mouth 2 (two) times daily as needed.  01/29/15   Coral Spikes, DO  traZODone (DESYREL) 100 MG tablet Take 1 tablet (100 mg total) by mouth at bedtime. 01/16/14   Gerda Diss, MD  verapamil (CALAN) 120 MG tablet Take 1 tablet (120 mg total) by mouth 3 (three) times daily. Patient taking differently: Take 120 mg by mouth daily.  01/29/15   Jayce G Cook, DO   BP 169/84 mmHg  Pulse 59  Temp(Src) 97.4 F (36.3 C) (Oral)  Resp 22  SpO2 93%  Physical Exam  Constitutional: He is oriented to person, place, and time. He appears well-developed and well-nourished.  HENT:  Head: Normocephalic and atraumatic.  Eyes: EOM are normal. Pupils are equal, round, and reactive to light.  Normal fundi; photophobia present  Neck: Normal range of motion. Neck supple. No JVD present.  Cardiovascular: Normal rate, regular rhythm and normal heart sounds.   No murmur heard. Pulmonary/Chest: Effort normal and breath sounds normal. He has no wheezes. He has no rales. He exhibits no tenderness.  Abdominal: Soft. He exhibits no distension and no mass. There is no tenderness.  Musculoskeletal: Normal range of motion. He exhibits no edema.  Lymphadenopathy:    He has no cervical adenopathy.  Neurological: He is alert and oriented to person, place, and time. He has normal reflexes. No cranial nerve deficit. He exhibits normal muscle tone. Coordination normal.  Skin: Skin is warm and dry. No rash noted.  Psychiatric: He has a normal mood and affect. His behavior is normal.  Judgment and thought content normal.  Nursing note and vitals reviewed.   ED Course  Procedures (including critical care time) DIAGNOSTIC STUDIES: Oxygen Saturation is 93% on RA, low by my interpretation.    COORDINATION OF CARE: 1:12 AM-Discussed treatment plan which includes IV fluids with pt at bedside and pt agreed to plan.     MDM   Final diagnoses:  Migraine without status migrainosus, not intractable, unspecified migraine type    Headache consistent with migraine variant. Headache is typical of his migraines. Review of old records shows several ED visits including 14 days ago. Patient describes headaches coming daily over a period of time which suggests cluster headache, but he does not have the lacrimation and nasal drainage that would be typical cluster headache. He'll be  treated with migraine cocktail of IV fluids, metoclopramide, diphenhydramine, dexamethasone, and ketorolac.  After above noted treatment, he went to the nursing station and states that he was leaving. I did not have an opportunity to talk with him before he left.  I, Yukio Bisping, personally performed the services described in this documentation. All medical record entries made by the scribe were at my direction and in my presence.  I have reviewed the chart and discharge instructions and agree that the record reflects my personal performance and is accurate and complete. Tremaine Earwood.  07/08/2015. 1:26 AM.       Delora Fuel, MD [id number]/67/67 [id number]

## 2015-07-08 NOTE — ED Notes (Signed)
Pt went to nurses station and stated that he was leaving and did not want to wait to be DC. Asked pt to sign AMA form and pt stated he wanted to leave and would not sign. Did confirm that pt no longer had IV in arm.

## 2015-07-16 ENCOUNTER — Ambulatory Visit (INDEPENDENT_AMBULATORY_CARE_PROVIDER_SITE_OTHER): Payer: Medicare Other | Admitting: Family Medicine

## 2015-07-16 ENCOUNTER — Encounter: Payer: Self-pay | Admitting: Family Medicine

## 2015-07-16 ENCOUNTER — Other Ambulatory Visit (HOSPITAL_COMMUNITY)
Admission: RE | Admit: 2015-07-16 | Discharge: 2015-07-16 | Disposition: A | Discharge status: Home / Self Care | Payer: Medicare Other | Source: Ambulatory Visit | Attending: Family Medicine | Admitting: Family Medicine

## 2015-07-16 VITALS — BP 122/84 | HR 86 | Temp 98.7°F | Ht 67.0 in | Wt 176.0 lb

## 2015-07-16 DIAGNOSIS — G43009 Migraine without aura, not intractable, without status migrainosus: Secondary | ICD-10-CM | POA: Diagnosis not present

## 2015-07-16 DIAGNOSIS — I10 Essential (primary) hypertension: Secondary | ICD-10-CM

## 2015-07-16 DIAGNOSIS — Z114 Encounter for screening for human immunodeficiency virus [HIV]: Secondary | ICD-10-CM | POA: Diagnosis not present

## 2015-07-16 DIAGNOSIS — Z202 Contact with and (suspected) exposure to infections with a predominantly sexual mode of transmission: Secondary | ICD-10-CM

## 2015-07-16 DIAGNOSIS — Z0000 Encounter for general adult medical examination without abnormal findings: Secondary | ICD-10-CM

## 2015-07-16 DIAGNOSIS — Z7251 High risk heterosexual behavior: Secondary | ICD-10-CM | POA: Diagnosis not present

## 2015-07-16 DIAGNOSIS — Z113 Encounter for screening for infections with a predominantly sexual mode of transmission: Secondary | ICD-10-CM | POA: Diagnosis not present

## 2015-07-16 DIAGNOSIS — Z1159 Encounter for screening for other viral diseases: Secondary | ICD-10-CM | POA: Diagnosis not present

## 2015-07-16 LAB — RPR

## 2015-07-16 MED ORDER — CYCLOBENZAPRINE HCL 10 MG PO TABS: 10.0000 mg | ORAL_TABLET | Freq: Three times a day (TID) | ORAL | Status: DC | PRN

## 2015-07-16 MED ORDER — SUMATRIPTAN SUCCINATE 50 MG PO TABS: ORAL_TABLET | ORAL | Status: DC

## 2015-07-16 NOTE — Progress Notes (Signed)
Subjective    Jaime Barnes is a 64 y.o. male that presents for yearly physical exam.   Concerns:  1. Left neck pain: Symptoms started two weeks ago. Pain located left side and radiates from neck to shoulder. No weakness. No radiation down his arm. He has some trouble turning his head left. Running hot water helps with pain.  2. Headache: Patient has a history of migraines in the past. He reports having seen a specialist many years ago and was prescribed medication which resolved symptoms until about one month ago. He was seen at the ED twice this month for migraine symptoms. He has had to use Imitrex 3 times per day at times to relieve headache symptoms. No photophobia, auras or vision issues. No nausea.  Goals    . Quit smoking / using tobacco       Past Medical History  Diagnosis Date  . Barrett's esophagus 2011    Recommend repeat EGD for surveillance in 2 yrs-[facility]  . Cirrhosis ([place])   . Pancreatitis   . Pulmonary nodule   . Chronic kidney disease   . Hypertension   . Hyperlipidemia   . Bipolar disorder ([place])   . Diverticulosis of colon   . Impotence   . GANGLION CYST, TENDON SHEATH 06/26/2010  . Pulmonary nodule seen on imaging study 02/06/2011    Followup CT in December 2014 with 20 months of stability at 4 mm.  No further imaging recommended     Past Surgical History  Procedure Laterality Date  . Cardiac catheterization  2005    Nonobstructive, <40%    Current Outpatient Prescriptions on File Prior to Visit  Medication Sig Dispense Refill  . albuterol (PROVENTIL HFA) 108 (90 BASE) MCG/ACT inhaler Inhale 2 puffs into the lungs every 6 (six) hours as needed for wheezing. 1 Inhaler 0  . atorvastatin (LIPITOR) 40 MG tablet Take 1 tablet (40 mg total) by mouth daily. 90 tablet 1  . cyclobenzaprine (FLEXERIL) 10 MG tablet TAKE 1 TABLET BY MOUTH THREE TIMES DAILY AS NEEDED FOR MUSCLE SPASMS 30 tablet 0  . gabapentin (NEURONTIN) 300 MG capsule Take 1 capsule (300 mg  total) by mouth at bedtime. (Patient taking differently: Take 300 mg by mouth daily. ) 32 capsule 1  . hydrOXYzine (ATARAX/VISTARIL) 25 MG tablet at bedtime as needed.   2  . ibuprofen (ADVIL,MOTRIN) 600 MG tablet Take 1 tablet (600 mg total) by mouth every 8 (eight) hours as needed. 10 tablet 0  . lisinopril-hydrochlorothiazide (PRINZIDE,ZESTORETIC) 20-25 MG per tablet Take 1 tablet by mouth daily. 30 tablet 2  . meclizine (ANTIVERT) 32 MG tablet Take 1 tablet (32 mg total) by mouth 3 (three) times daily as needed for dizziness. (Patient taking differently: Take 32 mg by mouth at bedtime as needed for dizziness. ) 90 tablet 0  . omeprazole (PRILOSEC) 20 MG capsule Take 1 capsule (20 mg total) by mouth daily. 90 capsule 1  . polyethylene glycol (MIRALAX) packet Take 17 g by mouth 2 (two) times daily. Until daily soft stools, then decrease to once per day. 30 each 0  . risperiDONE (RISPERDAL) 2 MG tablet at bedtime.   2  . SUMAtriptan (IMITREX) 50 MG tablet TAKE 1 TABLET BY MOUTH AS NEEDED FOR HEADACHE, MAY REPEAT IN 2HRS. IF NEEDED 12 tablet 0  . thiothixene (NAVANE) 2 MG capsule   2  . topiramate (TOPAMAX) 50 MG tablet Take 1 tablet (50 mg total) by mouth 2 (two) times daily. (  Patient taking differently: Take 50 mg by mouth 2 (two) times daily as needed. ) 180 tablet 1  . traZODone (DESYREL) 100 MG tablet Take 1 tablet (100 mg total) by mouth at bedtime. 90 tablet 1  . verapamil (CALAN) 120 MG tablet Take 1 tablet (120 mg total) by mouth 3 (three) times daily. (Patient taking differently: Take 120 mg by mouth daily. ) 90 tablet 1   No current facility-administered medications on file prior to visit.    Allergies  Allergen Reactions  . Codeine Phosphate Other (See Comments)    REACTION: breathing difficulty  . Penicillins Rash and Other (See Comments)    Break out in sweats/ Rash with amoxicillin. Denies airway involvement.    Social History   Social History  . Marital Status: Legally  Separated    Spouse Name: N/A  . Number of Children: N/A  . Years of Education: N/A   Social History Main Topics  . Smoking status: Current Every Day Smoker -- 0.00 packs/day for 50 years    Types: Cigarettes    Last Attempt to Quit: 05/16/2015  . Smokeless tobacco: Not on file     Comment: none in 2 days (6.21.13)  . Alcohol Use: Yes  . 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 Sexual Activity: Yes   Other Topics Concern  . Not on file   Social History Narrative    History reviewed. No pertinent family history.  ROS  Per HPI  Objective   BP 148/89 mmHg  Pulse 86  Temp(Src) 98.7 F (37.1 C) (Oral)  Ht 5\' 7"  (1.702 m)  Wt 176 lb (79.833 kg)  BMI 27.56 kg/m2  General: Well appearing, no distress HEENT: Pupils equal and reactive to light/accomodation. Extraocular movements intact bilaterally. Tympanic membranes normal bilaterally. Nares patent bilaterally. Oropharnx clear and moist. No cervical adenopathy bilaterally Respiratory/Chest: Clear to auscultation bilaterally Cardiovascular: No murmur Gastrointestinal: Soft, non-tender, non-distended, no hepatosplenomegaly Genitourinary: Not examined    Musculoskeletal: Normal tone/bulk, left trapezius with slight spasm and mild tenderness. No clavicular or scapular spine tenderness. Full shoulder ROM. 5/5 strength bilaterally Neuro: Alert, oriented, no distress. CN intact. No photophobia. Dermatologic: No obvious rashes Psychiatric: Slightly flat affect  No orders of the defined types were placed in this encounter.    Assessment and Plan    Health Maintenance Due  Topic Date Due  . Hepatitis C Screening  August 21, 1951  . ZOSTAVAX  07/21/2010  . INFLUENZA VACCINE  04/30/2015   Labs: Lipid panel

## 2015-07-16 NOTE — Patient Instructions (Signed)
Thank you for coming to see me today. It was a pleasure. Today we talked about:   Overall health: Please call me regarding your medications. I am prescribing Flexeril for your shoulder. Also, Please use heating pads and massage to help. I am getting some labs. You should get results in the mail.  Please make an appointment to see me in 3 months for follow-up.  If you have any questions or concerns, please do not hesitate to call the office at [phone].  Sincerely,  Cordelia Poche, MD

## 2015-07-17 LAB — LIPID PANEL
CHOL/HDL RATIO: 3.8 ratio (ref <=5.0)
CHOLESTEROL: 117 mg/dL — AB (ref 125–200)
HDL: 31 mg/dL — ABNORMAL LOW (ref >=40)
LDL Cholesterol: 49 mg/dL (ref <130)
Triglycerides: 183 mg/dL — ABNORMAL HIGH (ref <150)
VLDL: 37 mg/dL — ABNORMAL HIGH (ref <30)

## 2015-07-17 LAB — URINE CYTOLOGY ANCILLARY ONLY
Chlamydia: NEGATIVE
NEISSERIA GONORRHEA: NEGATIVE

## 2015-07-17 NOTE — Assessment & Plan Note (Signed)
Patient recently seen in ED for recurrent headaches similar to migraine attacks. Patient is requesting neurology follow-up today as he states he previously had a neurologist that gave him something that kept him headache free for years. Patient tolerates Imitrex, but does not think it always works well. Will need to be watchful with regard to his blood pressure and this medication. Will place referral to neurology.

## 2015-07-17 NOTE — Assessment & Plan Note (Signed)
Patient adherent with regimen although cannot verify. Patient may be having some side effects but recommended calling back to confirm it is the blood pressure medication causing his symptoms. No changes today.

## 2015-07-18 ENCOUNTER — Encounter: Payer: Self-pay | Admitting: Family Medicine

## 2015-07-19 ENCOUNTER — Encounter (HOSPITAL_COMMUNITY): Payer: Self-pay | Admitting: Emergency Medicine

## 2015-07-19 ENCOUNTER — Emergency Department (HOSPITAL_COMMUNITY): Payer: Medicare Other

## 2015-07-19 ENCOUNTER — Emergency Department (HOSPITAL_COMMUNITY)
Admission: EM | Admit: 2015-07-19 | Discharge: 2015-07-19 | Discharge status: Against Medical Advice | Payer: Medicare Other | Attending: Emergency Medicine | Admitting: Emergency Medicine

## 2015-07-19 DIAGNOSIS — E785 Hyperlipidemia, unspecified: Secondary | ICD-10-CM | POA: Insufficient documentation

## 2015-07-19 DIAGNOSIS — F319 Bipolar disorder, unspecified: Secondary | ICD-10-CM | POA: Diagnosis not present

## 2015-07-19 DIAGNOSIS — I129 Hypertensive chronic kidney disease with stage 1 through stage 4 chronic kidney disease, or unspecified chronic kidney disease: Secondary | ICD-10-CM | POA: Diagnosis not present

## 2015-07-19 DIAGNOSIS — Z79899 Other long term (current) drug therapy: Secondary | ICD-10-CM | POA: Diagnosis not present

## 2015-07-19 DIAGNOSIS — R51 Headache: Secondary | ICD-10-CM | POA: Diagnosis present

## 2015-07-19 DIAGNOSIS — Z720 Tobacco use: Secondary | ICD-10-CM | POA: Insufficient documentation

## 2015-07-19 DIAGNOSIS — Z8739 Personal history of other diseases of the musculoskeletal system and connective tissue: Secondary | ICD-10-CM | POA: Diagnosis not present

## 2015-07-19 DIAGNOSIS — R519 Headache, unspecified: Secondary | ICD-10-CM

## 2015-07-19 DIAGNOSIS — N189 Chronic kidney disease, unspecified: Secondary | ICD-10-CM | POA: Insufficient documentation

## 2015-07-19 DIAGNOSIS — Z9889 Other specified postprocedural states: Secondary | ICD-10-CM | POA: Insufficient documentation

## 2015-07-19 DIAGNOSIS — K2270 Barrett's esophagus without dysplasia: Secondary | ICD-10-CM | POA: Insufficient documentation

## 2015-07-19 DIAGNOSIS — Z880 Allergy status to penicillin: Secondary | ICD-10-CM | POA: Insufficient documentation

## 2015-07-19 DIAGNOSIS — R0602 Shortness of breath: Secondary | ICD-10-CM | POA: Diagnosis not present

## 2015-07-19 DIAGNOSIS — I10 Essential (primary) hypertension: Secondary | ICD-10-CM | POA: Diagnosis not present

## 2015-07-19 LAB — CBC WITH DIFFERENTIAL/PLATELET
BASOS ABS: 0 10*3/uL (ref 0.0–0.1)
BASOS PCT: 0 %
EOS ABS: 0.1 10*3/uL (ref 0.0–0.7)
Eosinophils Relative: 1 %
HEMATOCRIT: 42 % (ref 39.0–52.0)
HEMOGLOBIN: 14.4 g/dL (ref 13.0–17.0)
Lymphocytes Relative: 16 %
Lymphs Abs: 3.1 10*3/uL (ref 0.7–4.0)
MCH: 31 pg (ref 26.0–34.0)
MCHC: 34.3 g/dL (ref 30.0–36.0)
MCV: 90.3 fL (ref 78.0–100.0)
MONO ABS: 1.7 10*3/uL — AB (ref 0.1–1.0)
MONOS PCT: 9 %
NEUTROS ABS: 14.3 10*3/uL — AB (ref 1.7–7.7)
NEUTROS PCT: 74 %
Platelets: 244 10*3/uL (ref 150–400)
RBC: 4.65 MIL/uL (ref 4.22–5.81)
RDW: 13.8 % (ref 11.5–15.5)
WBC: 19.2 10*3/uL — ABNORMAL HIGH (ref 4.0–10.5)

## 2015-07-19 LAB — BLOOD GAS, VENOUS
Acid-base deficit: 5.4 mmol/L — ABNORMAL HIGH (ref 0.0–2.0)
Bicarbonate: 19 mEq/L — ABNORMAL LOW (ref 20.0–24.0)
FIO2: 0.21
O2 Saturation: 66.8 %
PCO2 VEN: 35.5 mmHg — AB (ref 45.0–50.0)
PO2 VEN: 37.4 mmHg (ref 30.0–45.0)
Patient temperature: 98.6
TCO2: 16.9 mmol/L (ref 0–100)
pH, Ven: 7.347 — ABNORMAL HIGH (ref 7.250–7.300)

## 2015-07-19 LAB — I-STAT CHEM 8, ED
BUN: 28 mg/dL — ABNORMAL HIGH (ref 6–20)
CREATININE: 2.6 mg/dL — AB (ref 0.61–1.24)
Calcium, Ion: 1.12 mmol/L — ABNORMAL LOW (ref 1.13–1.30)
Chloride: 102 mmol/L (ref 101–111)
Glucose, Bld: 169 mg/dL — ABNORMAL HIGH (ref 65–99)
HEMATOCRIT: 45 % (ref 39.0–52.0)
Hemoglobin: 15.3 g/dL (ref 13.0–17.0)
POTASSIUM: 3.4 mmol/L — AB (ref 3.5–5.1)
SODIUM: 135 mmol/L (ref 135–145)
TCO2: 17 mmol/L (ref 0–100)

## 2015-07-19 LAB — D-DIMER, QUANTITATIVE (NOT AT ARMC)

## 2015-07-19 MED ORDER — MAGNESIUM SULFATE IN D5W 10-5 MG/ML-% IV SOLN
1.0000 g | Freq: Once | INTRAVENOUS | Status: AC
  Administered 2015-07-19: 1 g via INTRAVENOUS
  Filled 2015-07-19: qty 100

## 2015-07-19 MED ORDER — IPRATROPIUM BROMIDE 0.02 % IN SOLN
0.5000 mg | Freq: Once | RESPIRATORY_TRACT | Status: AC
  Administered 2015-07-19: 0.5 mg via RESPIRATORY_TRACT
  Filled 2015-07-19: qty 2.5

## 2015-07-19 MED ORDER — DIPHENHYDRAMINE HCL 50 MG/ML IJ SOLN
25.0000 mg | Freq: Once | INTRAMUSCULAR | Status: AC
  Administered 2015-07-19: 25 mg via INTRAVENOUS
  Filled 2015-07-19: qty 1

## 2015-07-19 MED ORDER — ALBUTEROL SULFATE (2.5 MG/3ML) 0.083% IN NEBU
5.0000 mg | INHALATION_SOLUTION | Freq: Once | RESPIRATORY_TRACT | Status: AC
  Administered 2015-07-19: 5 mg via RESPIRATORY_TRACT
  Filled 2015-07-19: qty 6

## 2015-07-19 MED ORDER — METOCLOPRAMIDE HCL 5 MG/ML IJ SOLN
10.0000 mg | Freq: Once | INTRAMUSCULAR | Status: AC
  Administered 2015-07-19: 10 mg via INTRAVENOUS
  Filled 2015-07-19: qty 2

## 2015-07-19 MED ORDER — SODIUM CHLORIDE 0.9 % IV BOLUS (SEPSIS)
1000.0000 mL | Freq: Once | INTRAVENOUS | Status: AC
  Administered 2015-07-19: 1000 mL via INTRAVENOUS

## 2015-07-19 NOTE — ED Notes (Signed)
Patient transported to CT 

## 2015-07-19 NOTE — ED Provider Notes (Signed)
CSN: [id number]     Arrival date & time 07/19/15  0528 History   First MD Initiated Contact with Patient 07/19/15 0606     Chief Complaint  Patient presents with  . Headache     (Consider location/radiation/quality/duration/timing/severity/associated sxs/prior Treatment) HPI   64 year old male with history of bipolar, cirrhosis, hypertension who brought in by PTAR from home for evaluation of headache. Reports he has had a recurrent right temporal headache ongoing for the past 2-3 weeks. He describes headache as a throbbing sensation, 10 out of 10, improves when he takes his Imitrex but he has ran out of Imitrex for the past 2 days. States that he takes about 3-4 Imitrex daily as needed. Report that light and sounds bothers him. No report of fever, chills, neck stiffness, URI symptoms, blurred vision, lightheadedness, dizziness, nausea vomiting, focal numbness or weakness. No report of chest pain, shortness of breath, productive cough, hemoptysis, dysuria, or rash. Denies any scintillating scotomata. Furthermore he denies any alcohol abuse/recreational drug use. Patient immediately requesting for "a shot to get my headache to go away". Patient was last seen in the ED 11 days ago for the same complaint and left AMA after receiving a migraine cocktail. He also has been seen by his primary care provider for the same complaint.  Past Medical History  Diagnosis Date  . Barrett's esophagus 2011    Recommend repeat EGD for surveillance in 2 yrs-[facility]  . Cirrhosis ([place])   . Pancreatitis   . Pulmonary nodule   . Chronic kidney disease   . Hypertension   . Hyperlipidemia   . Bipolar disorder ([place])   . Diverticulosis of colon   . Impotence   . GANGLION CYST, TENDON SHEATH 06/26/2010  . Pulmonary nodule seen on imaging study 02/06/2011    Followup CT in December 2014 with 20 months of stability at 4 mm.  No further imaging recommended    Past Surgical History  Procedure Laterality Date  .  Cardiac catheterization  2005    Nonobstructive, <40%   History reviewed. No pertinent family history. Social History  Substance Use Topics  . Smoking status: Current Every Day Smoker -- 0.00 packs/day for 50 years    Types: Cigarettes    Last Attempt to Quit: 05/16/2015  . Smokeless tobacco: None     Comment: none in 2 days (6.21.13)  . Alcohol Use: Yes     Comment: Last drink 17 months ago.    Review of Systems  All other systems reviewed and are negative.     Allergies  Codeine phosphate and Penicillins  Home Medications   Prior to Admission medications   Medication Sig Start Date End Date Taking? Authorizing Provider  albuterol (PROVENTIL HFA) 108 (90 BASE) MCG/ACT inhaler Inhale 2 puffs into the lungs every 6 (six) hours as needed for wheezing. 01/29/15   Coral Spikes, DO  atorvastatin (LIPITOR) 40 MG tablet Take 1 tablet (40 mg total) by mouth daily. 01/29/15   Coral Spikes, DO  cyclobenzaprine (FLEXERIL) 10 MG tablet Take 1 tablet (10 mg total) by mouth 3 (three) times daily as needed. for muscle spams 07/16/15   Mariel Aloe, MD  gabapentin (NEURONTIN) 300 MG capsule Take 1 capsule (300 mg total) by mouth at bedtime. Patient taking differently: Take 300 mg by mouth daily.  04/19/15   Olam Idler, MD  hydrOXYzine (ATARAX/VISTARIL) 25 MG tablet at bedtime as needed.  07/30/14   Historical Provider, MD  ibuprofen (ADVIL,MOTRIN) 600  MG tablet Take 1 tablet (600 mg total) by mouth every 8 (eight) hours as needed. Patient not taking: Reported on 07/16/2015 05/03/15   Mariel Aloe, MD  lisinopril-hydrochlorothiazide (PRINZIDE,ZESTORETIC) 20-25 MG per tablet Take 1 tablet by mouth daily. 05/03/15   Mariel Aloe, MD  meclizine (ANTIVERT) 32 MG tablet Take 1 tablet (32 mg total) by mouth 3 (three) times daily as needed for dizziness. Patient taking differently: Take 32 mg by mouth at bedtime as needed for dizziness.  01/29/15   Coral Spikes, DO  omeprazole (PRILOSEC) 20 MG capsule  Take 1 capsule (20 mg total) by mouth daily. 01/29/15   Coral Spikes, DO  polyethylene glycol (MIRALAX) packet Take 17 g by mouth 2 (two) times daily. Until daily soft stools, then decrease to once per day. 05/03/15   Mariel Aloe, MD  risperiDONE (RISPERDAL) 2 MG tablet at bedtime.  08/03/14   Historical Provider, MD  SUMAtriptan (IMITREX) 50 MG tablet TAKE 1 TABLET BY MOUTH AS NEEDED FOR HEADACHE, MAY REPEAT IN 2HRS. IF NEEDED 07/16/15   Mariel Aloe, MD  thiothixene (NAVANE) 2 MG capsule  08/03/14   Historical Provider, MD  topiramate (TOPAMAX) 50 MG tablet Take 1 tablet (50 mg total) by mouth 2 (two) times daily. Patient taking differently: Take 50 mg by mouth 2 (two) times daily as needed.  01/29/15   Coral Spikes, DO  traZODone (DESYREL) 100 MG tablet Take 1 tablet (100 mg total) by mouth at bedtime. 01/16/14   Gerda Diss, MD  verapamil (CALAN) 120 MG tablet Take 1 tablet (120 mg total) by mouth 3 (three) times daily. Patient taking differently: Take 120 mg by mouth daily.  01/29/15   Jayce G Cook, DO   BP 113/63 mmHg  Pulse 53  Temp(Src) 98.2 F (36.8 C) (Oral)  SpO2 89% Physical Exam  Constitutional: He is oriented to person, place, and time. He appears well-developed and well-nourished. No distress.  African-American male laying in bed with lights off in the room. Nontoxic in appearance.  HENT:  Head: Atraumatic.  Right Ear: External ear normal.  Left Ear: External ear normal.  Mouth/Throat: Oropharynx is clear and moist.  No temporal bruits.    Eyes: Conjunctivae and EOM are normal. Pupils are equal, round, and reactive to light.  Neck: Neck supple.  No nuchal rigidity  Cardiovascular: Normal rate and regular rhythm.   Pulmonary/Chest: Effort normal and breath sounds normal.  Abdominal: Soft. There is no tenderness.  Neurological: He is alert and oriented to person, place, and time.  Neurologic exam:  Speech clear, pupils equal round reactive to light, extraocular movements  intact  Normal peripheral visual fields Cranial nerves III through XII normal including no facial droop Follows commands, moves all extremities x4, normal strength to bilateral upper and lower extremities at all major muscle groups including grip Sensation normal to light touch  Coordination intact, no limb ataxia, finger-nose-finger normal Rapid alternating movements normal No pronator drift Gait normal   Skin: No rash noted.  Psychiatric: He has a normal mood and affect.  Nursing note and vitals reviewed.   ED Course  Procedures (including critical care time)   Patient here with recurrent right temporal headache. Headache is typical of his chronic migraine headache in which is seen multiple times in the past. He has no fever, nuchal rigidity concerning for meningitis. He has no focal neuro deficit concerning for stroke. He however is hypoxic upon arrival with an oxygenation is  of 85% on room air shortly improved to 97% with 3 L of O2. Patient denies having any shortness of breath lightheadedness or dizziness. I have rechecked his oximetry several times and it appears to be reading correctly. This will need to be investigated further. Given his recurrent visits for the same complaint without any recent advanced imaging, a head CT scan was ordered. If CT normal, patient will receive migraine cocktail. For the lab work is obtained to evaluate his for his hypoxia. Patient otherwise denies having any significant risk factors for PE and denies having any shortness of breath at this time.  7:14 AM Care discussed with Dr. Dolly Rias.  Pt now did report mild tachypnea but felt it was 2/2 to his headache.  On reexamination pt does have faint expiratory wheezes.  Will give albuterol/atrovent nebs along with migraine cocktail.  Albuterol/atrovent will be administered.    7:28 AM ECG showing slow afib, and right heart strain which appears new from recent ECG. D-dimer ordered to r/o PE.  His CXR is  unremarkable.    8:05 AM There is a leukocytosis with WBC 19.2 without left shift. His renal function is abnormal with BUN 28, creatinine 2.6. He does have history of chronic kidney disease. Head CT scan and chest x-ray are unremarkable. After receiving migraine cocktail patient felt better, when he ambulates oxygen maintains at 95% on room air. No clearance of hypoxia. D-dimer has not resulted yet however patient would like to leave AMA. I did discuss the abnormal labs and encouraged patient to stay for further workup however patient refused. At this time patient is able to make informed decision. No neck stiffness or fever to suggest infectious etiology that can be attributed to his current condition. He does have a primary care provider and does have follow-up for further evaluation of his recurrent headache. Since then to return promptly if his condition worsen or if he has any other concern. Care discussed with my attending.  Pt left AMA during last ER visit for same.  Moderate of time were spent to convince pt to stay.  Pt well aware that he can return.    Labs Review Labs Reviewed  CBC WITH DIFFERENTIAL/PLATELET - Abnormal; Notable for the following:    WBC 19.2 (*)    Neutro Abs 14.3 (*)    Monocytes Absolute 1.7 (*)    All other components within normal limits  BLOOD GAS, VENOUS - Abnormal; Notable for the following:    pH, Ven 7.347 (*)    pCO2, Ven 35.5 (*)    Bicarbonate 19.0 (*)    Acid-base deficit 5.4 (*)    All other components within normal limits  I-STAT CHEM 8, ED - Abnormal; Notable for the following:    Potassium 3.4 (*)    BUN 28 (*)    Creatinine, Ser 2.60 (*)    Glucose, Bld 169 (*)    Calcium, Ion 1.12 (*)    All other components within normal limits  D-DIMER, QUANTITATIVE (NOT AT [facility])    Imaging Review Dg Chest 2 View  07/19/2015  CLINICAL DATA:  Shortness of breath.  Hypertension.  Headache. EXAM: CHEST  2 VIEW COMPARISON:  01/25/2011 FINDINGS: The heart  size and mediastinal contours are within normal limits. Diffuse pulmonary interstitial prominence appears stable. No evidence of pulmonary airspace disease or pleural effusion. The visualized skeletal structures are unremarkable. IMPRESSION: Stable exam.  No active cardiopulmonary disease. Electronically Signed   By: Earle Gell M.D.   On:  07/19/2015 07:12   Ct Head Wo Contrast  07/19/2015  CLINICAL DATA:  Severe headache.  Hypertension. EXAM: CT HEAD WITHOUT CONTRAST TECHNIQUE: Contiguous axial images were obtained from the base of the skull through the vertex without intravenous contrast. COMPARISON:  12/04/2014 FINDINGS: No evidence of intracranial hemorrhage, brain edema, or other signs of acute infarction. No evidence of intracranial mass lesion or mass effect. No abnormal extraaxial fluid collections identified. Ventricles are normal in size. No skull abnormality identified. IMPRESSION: Negative noncontrast head CT. Electronically Signed   By: Earle Gell M.D.   On: 07/19/2015 07:14   I have personally reviewed and evaluated these images and lab results as part of my medical decision-making.   EKG Interpretation None     ED ECG REPORT   Date: 07/19/2015  Rate: 53  Rhythm: atrial fibrillation  QRS Axis: right  Intervals: normal  ST/T Wave abnormalities: nonspecific ST/T changes  Conduction Disutrbances:none  Narrative Interpretation: new R axis deviation  Old EKG Reviewed: changes noted  I have personally reviewed the EKG tracing and agree with the computerized printout as noted.   MDM   Final diagnoses:  Bad headache    BP 113/63 mmHg  Pulse 53  Temp(Src) 98.2 F (36.8 C) (Oral)  SpO2 95%     Domenic Moras, PA-C 07/19/15 1655  Merrily Pew, MD 07/20/15 [phone]

## 2015-07-19 NOTE — ED Notes (Signed)
Patient requested to leave AMA after being given medications for his headache.  Patient was instructed of the dangers of him  Leaving and was informed to return if needed.

## 2015-07-19 NOTE — ED Notes (Signed)
Pt brought in by PTAR  Pt is out of his medication and has a headache since 4am this morning  VS 120/78-60-93% on room air  Pt is alert and oriented x 4

## 2015-07-31 ENCOUNTER — Telehealth: Payer: Self-pay | Admitting: Family Medicine

## 2015-07-31 DIAGNOSIS — F209 Schizophrenia, unspecified: Secondary | ICD-10-CM | POA: Diagnosis not present

## 2015-07-31 DIAGNOSIS — E785 Hyperlipidemia, unspecified: Secondary | ICD-10-CM

## 2015-07-31 NOTE — Telephone Encounter (Signed)
Patient is needing his medications called in to [facility] on [address].  He said they were supposed to be called in at his last visit and they are not there.

## 2015-08-02 NOTE — Telephone Encounter (Signed)
Unsure of which medications patient is requesting. Please inquire. I had sent prescriptions for Imitrex and Flexeril at last visit. THanks!

## 2015-08-02 NOTE — Telephone Encounter (Signed)
Pt states that we were supposed to call in his Cholesterol meds, and a "new BP med", and he never picked up the imitrex or flexeril.  He also wants to know about a HIV test that he was supposed to get.  Will forward to MD .Clinton Sawyer, Salome Spotted

## 2015-08-03 MED ORDER — ATORVASTATIN CALCIUM 40 MG PO TABS: 40.0000 mg | ORAL_TABLET | Freq: Every day | ORAL | Status: DC

## 2015-08-03 NOTE — Telephone Encounter (Signed)
attempted to call pt, no answer and no machine. Fleeger, Salome Spotted

## 2015-08-03 NOTE — Telephone Encounter (Signed)
Atorvastatin refill placed. Unsure of what blood pressure medication patient is talking about. Per my last note, I did not plan on making any BP med changes as patient could not tell me exactly what he was taking and not taking. This is also per AVS which patient was handed before discharge from the office.

## 2015-08-07 NOTE — Telephone Encounter (Signed)
LVM for pt to call the office. Sharon T Saunders, CMA  

## 2015-08-08 ENCOUNTER — Ambulatory Visit (INDEPENDENT_AMBULATORY_CARE_PROVIDER_SITE_OTHER): Payer: Medicare Other | Admitting: Family Medicine

## 2015-08-08 ENCOUNTER — Encounter: Payer: Self-pay | Admitting: Family Medicine

## 2015-08-08 VITALS — BP 126/78 | HR 80 | Temp 98.2°F | Ht 67.0 in | Wt 174.8 lb

## 2015-08-08 DIAGNOSIS — G4700 Insomnia, unspecified: Secondary | ICD-10-CM | POA: Diagnosis not present

## 2015-08-08 DIAGNOSIS — I2510 Atherosclerotic heart disease of native coronary artery without angina pectoris: Secondary | ICD-10-CM

## 2015-08-08 DIAGNOSIS — G43009 Migraine without aura, not intractable, without status migrainosus: Secondary | ICD-10-CM | POA: Diagnosis not present

## 2015-08-08 DIAGNOSIS — I10 Essential (primary) hypertension: Secondary | ICD-10-CM | POA: Diagnosis present

## 2015-08-08 DIAGNOSIS — M792 Neuralgia and neuritis, unspecified: Secondary | ICD-10-CM

## 2015-08-08 MED ORDER — GABAPENTIN 300 MG PO CAPS: 300.0000 mg | ORAL_CAPSULE | Freq: Every day | ORAL | Status: DC

## 2015-08-08 MED ORDER — LISINOPRIL 10 MG PO TABS: 10.0000 mg | ORAL_TABLET | Freq: Every day | ORAL | Status: DC

## 2015-08-08 NOTE — Assessment & Plan Note (Signed)
Patient taking medication incorrectly. Stressed Topamax 50mg  BID as a daily dose rather than PRN. Imitrex prn headache.

## 2015-08-08 NOTE — Patient Instructions (Signed)
Thank you for coming to see me today. It was a pleasure. Today we talked about:   Blood pressure: I will discontinue the hydrochlorothiazide. I will continue lisinopril 10mg  daily  Migraines: I want you to take topamax twice per day every day regardless of if you are having headaches.  Please make an appointment to see me in 3 months for follow-up.  If you have any questions or concerns, please do not hesitate to call the office at [phone].  Sincerely,  Cordelia Poche, MD

## 2015-08-08 NOTE — Progress Notes (Signed)
    Subjective    Jaime Barnes is a 64 y.o. male that presents for a follow-up visit for chronic issues.   1. Hypertension: Patient adherent with lisinopril-hctz 10-12.5mg  daily. He reports some lightheadedness that is transient. No chest pain or dyspnea. He would like to change blood pressure medications.  2. Headaches: Patient with chronic migraine. He is taking Imitrex prn headaches. He is taking Topamax prn. His last migraine was about one week ago.  Social History  Substance Use Topics  . Smoking status: Current Every Day Smoker -- 0.00 packs/day for 50 years    Types: Cigarettes    Last Attempt to Quit: 05/16/2015  . Smokeless tobacco: None     Comment: none in 2 days (6.21.13)  . Alcohol Use: Yes     Comment: Last drink 17 months ago.    Allergies  Allergen Reactions  . Codeine Phosphate Other (See Comments)    REACTION: breathing difficulty  . Penicillins Rash and Other (See Comments)    Break out in sweats/ Rash amoxicillin. Denies airway involvement Has patient had a PCN reaction causing immediate rash, facial/tongue/throat swelling, SOB or lightheadedness with hypotension: Yes Has patient had a PCN reaction causing severe rash involving mucus membranes or skin necrosis: Yes Has patient had a PCN reaction that required hospitalization No Has patient had a PCN reaction occurring within the last 10 years: No If all of the above answers are "NO", then may proceed with Cephalosporin u    No orders of the defined types were placed in this encounter.    ROS  Per HPI   Objective   BP 126/78 mmHg  Pulse 80  Temp(Src) 98.2 F (36.8 C) (Oral)  Ht 5\' 7"  (1.702 m)  Wt 174 lb 12.8 oz (79.289 kg)  BMI 27.37 kg/m2  SpO2 98%  General: Well appearing, no distress Respiratory/Chest: Clear to auscultation bilaterally Cardiovascular: Regular rate and rhythm, no murmur  Assessment and Plan    HYPERTENSION, BENIGN SYSTEMIC Discontinue HCTZ since patient believes he is  getting lightheaded from medication. Possibly too high of a dose. Continue lisinopril 10mg  daily  Migraine headache Patient taking medication incorrectly. Stressed Topamax 50mg  BID as a daily dose rather than PRN. Imitrex prn headache.

## 2015-08-08 NOTE — Assessment & Plan Note (Signed)
Discontinue HCTZ since patient believes he is getting lightheaded from medication. Possibly too high of a dose. Continue lisinopril 10mg  daily

## 2015-08-10 ENCOUNTER — Ambulatory Visit: Payer: Medicare Other

## 2015-08-14 ENCOUNTER — Ambulatory Visit: Payer: Medicare Other | Admitting: Podiatry

## 2015-08-22 ENCOUNTER — Ambulatory Visit (INDEPENDENT_AMBULATORY_CARE_PROVIDER_SITE_OTHER): Payer: Medicare Other | Admitting: Neurology

## 2015-08-22 ENCOUNTER — Encounter: Payer: Self-pay | Admitting: Neurology

## 2015-08-22 VITALS — BP 128/76 | HR 62 | Ht 67.0 in | Wt 178.0 lb

## 2015-08-22 DIAGNOSIS — I2510 Atherosclerotic heart disease of native coronary artery without angina pectoris: Secondary | ICD-10-CM

## 2015-08-22 DIAGNOSIS — F17200 Nicotine dependence, unspecified, uncomplicated: Secondary | ICD-10-CM | POA: Diagnosis not present

## 2015-08-22 DIAGNOSIS — G43009 Migraine without aura, not intractable, without status migrainosus: Secondary | ICD-10-CM

## 2015-08-22 MED ORDER — SUMATRIPTAN SUCCINATE 100 MG PO TABS: ORAL_TABLET | ORAL | Status: DC

## 2015-08-22 MED ORDER — NAPROXEN 500 MG PO TABS: 500.0000 mg | ORAL_TABLET | Freq: Two times a day (BID) | ORAL | Status: DC | PRN

## 2015-08-22 MED ORDER — NAPROXEN SODIUM 550 MG PO TABS: 550.0000 mg | ORAL_TABLET | Freq: Two times a day (BID) | ORAL | Status: DC

## 2015-08-22 NOTE — Progress Notes (Signed)
NEUROLOGY CONSULTATION NOTE  Jaime Barnes MRN: [id number] DOB: May 23, 1951  Referring provider: Cordelia Poche, MD Primary care provider: Cordelia Poche, MD  Reason for consult:  headache  HISTORY OF PRESENT ILLNESS: Jaime Barnes is a 64 year old right-handed male with hypertension, Bipolar disorder, CKD, tobacco user, Barrett's esophagus and cirrhosis who presents for headache.  History obtained by patient and ED notes.  Labs and images of brain CT and MRI reviewed.  Onset:  Has remote history of migraines but they returned several months ago after experiencing increased stress related to his ex-girlfriend. Location:  Right sided Quality:  pounding Intensity:  "50"/10 Aura:  no Prodrome:  no Associated symptoms:  Photophobia, phonophobia.  No nausea or visual disturbance Duration:  Until takes medication Frequency:  Up until one month ago, every other day.  No headache for past month since taking topiramate daily Triggers/exacerbating factors:  stress Relieving factors:  none Activity:  Cannot function  Past abortive medication:  Sumatriptan 50mg  (needed 3 tablets for it to work), ibuprofen Past preventative medication:  none Other past therapy:  none  Current abortive medication:  cyclobenzaprine 10mg  for neck pain Antihypertensive medications:  Lisinopril 10mg , verapamil 120mg  three times daily Antidepressant medications:  none Anticonvulsant medications:  topiramate 50mg , gabapentin 300mg  Vitamins/Herbal/Supplements:  none Other therapy:  Hydroxyzine 25mg  Other medication:  risperidone 2mg   CT of head from 07/19/15 was unremarkable.   MRI of brain from 12/04/14 showed global atrophy and mild small vessel ischemic changes.  PAST MEDICAL HISTORY: Past Medical History  Diagnosis Date  . Barrett's esophagus 2011    Recommend repeat EGD for surveillance in 2 yrs-[facility]  . Cirrhosis ([place])   . Pancreatitis   . Pulmonary nodule   . Chronic kidney disease   . Hypertension     . Hyperlipidemia   . Bipolar disorder ([place])   . Diverticulosis of colon   . Impotence   . GANGLION CYST, TENDON SHEATH 06/26/2010  . Pulmonary nodule seen on imaging study 02/06/2011    Followup CT in December 2014 with 20 months of stability at 4 mm.  No further imaging recommended     PAST SURGICAL HISTORY: Past Surgical History  Procedure Laterality Date  . Cardiac catheterization  2005    Nonobstructive, <40%    MEDICATIONS: Current Outpatient Prescriptions on File Prior to Visit  Medication Sig Dispense Refill  . albuterol (PROVENTIL HFA) 108 (90 BASE) MCG/ACT inhaler Inhale 2 puffs into the lungs every 6 (six) hours as needed for wheezing. 1 Inhaler 0  . atorvastatin (LIPITOR) 40 MG tablet Take 1 tablet (40 mg total) by mouth daily. 90 tablet 3  . cyclobenzaprine (FLEXERIL) 10 MG tablet Take 1 tablet (10 mg total) by mouth 3 (three) times daily as needed. for muscle spams (Patient taking differently: Take 10 mg by mouth every morning. for muscle spams) 30 tablet 0  . gabapentin (NEURONTIN) 300 MG capsule Take 1 capsule (300 mg total) by mouth at bedtime. 30 capsule 5  . hydrOXYzine (ATARAX/VISTARIL) 25 MG tablet Take 25 mg by mouth at bedtime as needed (headaches).   2  . lisinopril (PRINIVIL,ZESTRIL) 10 MG tablet Take 1 tablet (10 mg total) by mouth daily. 90 tablet 3  . meclizine (ANTIVERT) 32 MG tablet Take 1 tablet (32 mg total) by mouth 3 (three) times daily as needed for dizziness. (Patient taking differently: Take 32 mg by mouth at bedtime as needed for dizziness. ) 90 tablet 0  . omeprazole (PRILOSEC) 20 MG capsule  Take 1 capsule (20 mg total) by mouth daily. 90 capsule 1  . polyethylene glycol (MIRALAX) packet Take 17 g by mouth 2 (two) times daily. Until daily soft stools, then decrease to once per day. (Patient taking differently: Take 17 g by mouth at bedtime. Until daily soft stools, then decrease to once per day.) 30 each 0  . risperiDONE (RISPERDAL) 2 MG tablet at  bedtime.   2  . thiothixene (NAVANE) 2 MG capsule   2  . topiramate (TOPAMAX) 50 MG tablet Take 1 tablet (50 mg total) by mouth 2 (two) times daily. (Patient taking differently: Take 50 mg by mouth every morning. ) 180 tablet 1  . traZODone (DESYREL) 100 MG tablet Take 1 tablet (100 mg total) by mouth at bedtime. 90 tablet 1  . verapamil (CALAN) 120 MG tablet Take 1 tablet (120 mg total) by mouth 3 (three) times daily. (Patient taking differently: Take 120 mg by mouth daily. ) 90 tablet 1   No current facility-administered medications on file prior to visit.    ALLERGIES: Allergies  Allergen Reactions  . Codeine Phosphate Other (See Comments)    REACTION: breathing difficulty  . Penicillins Rash and Other (See Comments)    Break out in sweats/ Rash amoxicillin. Denies airway involvement Has patient had a PCN reaction causing immediate rash, facial/tongue/throat swelling, SOB or lightheadedness with hypotension: Yes Has patient had a PCN reaction causing severe rash involving mucus membranes or skin necrosis: Yes Has patient had a PCN reaction that required hospitalization No Has patient had a PCN reaction occurring within the last 10 years: No If all of the above answers are "NO", then may proceed with Cephalosporin u    FAMILY HISTORY: No family history on file.  SOCIAL HISTORY: Social History   Social History  . Marital Status: Legally Separated    Spouse Name: N/A  . Number of Children: N/A  . Years of Education: N/A   Occupational History  . Not on file.   Social History Main Topics  . Smoking status: Current Every Day Smoker -- 0.00 packs/day for 50 years    Types: Cigarettes    Last Attempt to Quit: 05/16/2015  . Smokeless tobacco: Not on file     Comment: none in 2 days (6.21.13)  . Alcohol Use: Yes     Comment: Last drink 17 months ago.  . Drug Use: Yes    Special: Marijuana     Comment: daily marijuana since teenager. Last 20 years ago.  [name] Sexual Activity: Yes     Birth Control/ Protection: Condom     Comment: Multiple sex partners. 60 in last year.   Other Topics Concern  . Not on file   Social History Narrative   Pt lives alone. No stairs in home. Pt has completed 8th grade.     REVIEW OF SYSTEMS: Constitutional: No fevers, chills, or sweats, no generalized fatigue, change in appetite Eyes: No visual changes, double vision, eye pain Ear, nose and throat: No hearing loss, ear pain, nasal congestion, sore throat Cardiovascular: No chest pain, palpitations Respiratory:  No shortness of breath at rest or with exertion, wheezes GastrointestinaI: No nausea, vomiting, diarrhea, abdominal pain, fecal incontinence Genitourinary:  No dysuria, urinary retention or frequency Musculoskeletal:  Neck pain Integumentary: No rash, pruritus, skin lesions Neurological: as above Psychiatric: No depression, insomnia, anxiety Endocrine: No palpitations, fatigue, diaphoresis, mood swings, change in appetite, change in weight, increased thirst Hematologic/Lymphatic:  No anemia, purpura, petechiae. Allergic/Immunologic: no itchy/runny  eyes, nasal congestion, recent allergic reactions, rashes  PHYSICAL EXAM: Filed Vitals:   08/22/15 0801  BP: 128/76  Pulse: 62   General: No acute distress.   Head:  Normocephalic/atraumatic Eyes:  fundi unremarkable, without vessel changes, exudates, hemorrhages or papilledema. Neck: supple, left sided paraspinal tenderness, full range of motion Back: No paraspinal tenderness Heart: regular rate and rhythm Lungs: Clear to auscultation bilaterally. Vascular: No carotid bruits. Neurological Exam: Mental status: alert and oriented to person, place, and time, recent and remote memory intact, fund of knowledge intact, attention and concentration intact, speech fluent and not dysarthric, language intact. Cranial nerves: CN I: not tested CN II: pupils equal, round and reactive to light, visual fields intact, fundi  unremarkable, without vessel changes, exudates, hemorrhages or papilledema. CN III, IV, VI:  full range of motion, no nystagmus, no ptosis CN V: facial sensation intact CN VII: upper and lower face symmetric CN VIII: hearing intact CN IX, X: gag intact, uvula midline CN XI: sternocleidomastoid and trapezius muscles intact CN XII: tongue midline Bulk & Tone: normal, no fasciculations. Motor:  5/5 throughout  Sensation: temperature and vibration sensation intact. Deep Tendon Reflexes:  2+ throughout, toes downgoing Finger to nose testing:  Without dysmetria.  Heel to shin:  Without dysmetria.  Gait:  Normal station and stride.  Able to turn and tandem walk. Romberg negative.  IMPRESSION: Migraine without aura Neck pain  PLAN: 1.  For abortive therapy, will try higher dose of sumatriptan 100mg  with or without naproxen 500mg  2.  Continue topiramate 50mg  twice daily 3.  Cyclobenzaprine 10mg  for neck pain 4.  Follow up in 3 months. 5.  Smoking cessation Thank you for allowing me to take part in the care of this patient.  Metta Clines, DO  CC:  Cordelia Poche, MD

## 2015-08-22 NOTE — Patient Instructions (Signed)
Migraine Recommendations: 1.  Continue topiramate 50mg  twice daily.  2.  Take sumatriptan 100mg  at earliest onset of headache.  May repeat dose once in 2 hours if needed.  Do not exceed two tablets in 24 hours.  If ineffective, may take each dose of sumatriptan with 500mg  of naproxen.   3.  Limit use of pain relievers to no more than 2 days out of the week.  These medications include acetaminophen, ibuprofen, triptans and narcotics.  This will help reduce risk of rebound headaches. 4.  Be aware of common food triggers such as processed sweets, processed foods with nitrites (such as deli meat, hot dogs, sausages), foods with MSG, alcohol (such as wine), chocolate, certain cheeses, certain fruits (dried fruits, some citrus fruit), vinegar, diet soda. 4.  Avoid caffeine 5.  Routine exercise 6.  Proper sleep hygiene 7.  Stay adequately hydrated with water 8.  Keep a headache diary. 9.  Maintain proper stress management. 10.  Do not skip meals. 11.  Consider supplements:  Magnesium oxide 400mg  to 600mg  daily, riboflavin 400mg , Coenzyme Q 10 100mg  three times daily 12.  Follow up in 3 months

## 2015-08-22 NOTE — Progress Notes (Signed)
Chart forwarded.  

## 2015-08-28 ENCOUNTER — Ambulatory Visit: Payer: Medicare Other | Admitting: Podiatry

## 2015-08-31 ENCOUNTER — Other Ambulatory Visit: Payer: Self-pay | Admitting: Family Medicine

## 2015-09-03 NOTE — Telephone Encounter (Signed)
2nd request.  Meredith Kilbride L, RN  

## 2015-09-07 ENCOUNTER — Ambulatory Visit: Payer: Medicare Other | Admitting: Podiatry

## 2015-09-18 ENCOUNTER — Ambulatory Visit: Payer: Medicare Other | Admitting: Podiatry

## 2015-09-24 ENCOUNTER — Ambulatory Visit: Payer: Medicare Other | Admitting: Podiatry

## 2015-10-18 ENCOUNTER — Encounter: Payer: Self-pay | Admitting: Family Medicine

## 2015-10-18 NOTE — Progress Notes (Unsigned)
Received request for a back brace and knee brace. Form for back brace filled out. Will not fill out knee brace form as patient has no history, from what I can find, regarding work up of knee pathology. Form for back brace placed in box for fax.

## 2015-10-19 ENCOUNTER — Telehealth: Payer: Self-pay

## 2015-10-19 NOTE — Telephone Encounter (Signed)
LM for pt to call the office. Please see documentation message from 10/18/2015. Request for back brace has been faxed. Knee brace request has not been faxed per Dr. Lonny Prude  "Will not fill out knee brace form as patient has no history, from what I can find, regarding work up of knee pathology." Ottis Stain, CMA

## 2015-11-02 ENCOUNTER — Other Ambulatory Visit: Payer: Self-pay | Admitting: *Deleted

## 2015-11-02 DIAGNOSIS — K219 Gastro-esophageal reflux disease without esophagitis: Secondary | ICD-10-CM

## 2015-11-05 MED ORDER — OMEPRAZOLE 20 MG PO CPDR: 20.0000 mg | DELAYED_RELEASE_CAPSULE | Freq: Every day | ORAL | Status: DC

## 2015-11-22 ENCOUNTER — Telehealth: Payer: Self-pay | Admitting: *Deleted

## 2015-11-22 NOTE — Telephone Encounter (Signed)
Called patient to offer flu vaccine. Scheduled patient for f/u with PCP and to receive flu vaccine on 12/05/15 at 1000. Velora Heckler, RN

## 2015-12-05 ENCOUNTER — Encounter: Payer: Self-pay | Admitting: Family Medicine

## 2015-12-05 ENCOUNTER — Ambulatory Visit (INDEPENDENT_AMBULATORY_CARE_PROVIDER_SITE_OTHER): Payer: Medicare Other | Admitting: Family Medicine

## 2015-12-05 VITALS — BP 104/71 | HR 101 | Temp 98.3°F | Wt 171.0 lb

## 2015-12-05 DIAGNOSIS — D126 Benign neoplasm of colon, unspecified: Secondary | ICD-10-CM

## 2015-12-05 DIAGNOSIS — G43009 Migraine without aura, not intractable, without status migrainosus: Secondary | ICD-10-CM | POA: Diagnosis not present

## 2015-12-05 DIAGNOSIS — I10 Essential (primary) hypertension: Secondary | ICD-10-CM

## 2015-12-05 DIAGNOSIS — R739 Hyperglycemia, unspecified: Secondary | ICD-10-CM

## 2015-12-05 DIAGNOSIS — R42 Dizziness and giddiness: Secondary | ICD-10-CM | POA: Diagnosis not present

## 2015-12-05 DIAGNOSIS — H547 Unspecified visual loss: Secondary | ICD-10-CM

## 2015-12-05 LAB — BASIC METABOLIC PANEL WITH GFR
BUN: 14 mg/dL (ref 7–25)
CALCIUM: 9.7 mg/dL (ref 8.6–10.3)
CO2: 28 mmol/L (ref 20–31)
CREATININE: 1.52 mg/dL — AB (ref 0.70–1.25)
Chloride: 99 mmol/L (ref 98–110)
GFR, EST AFRICAN AMERICAN: 55 mL/min — AB (ref >=60)
GFR, EST NON AFRICAN AMERICAN: 47 mL/min — AB (ref >=60)
GLUCOSE: 88 mg/dL (ref 65–99)
Potassium: 3.8 mmol/L (ref 3.5–5.3)
Sodium: 136 mmol/L (ref 135–146)

## 2015-12-05 LAB — POCT GLYCOSYLATED HEMOGLOBIN (HGB A1C): Hemoglobin A1C: 5.9

## 2015-12-05 NOTE — Progress Notes (Signed)
    Subjective    Jaime Barnes is a 65 y.o. male that presents for a follow-up visit for:   1. Lightheadedness: Symptoms are random. They do not appear related to position. He has no associated symptoms of chest pain, dyspnea but does reports vision changes (including decreased visual acuity). No head injuries. Not associated with migraines. Symptoms generally last about 20-25 minutes. Lying down helps his symptoms. He drinks about 4-5 bottles of water. No falls, no syncope. He does not drink alcohol anymore.  Social History  Substance Use Topics  . Smoking status: Current Every Day Smoker -- 0.00 packs/day for 50 years    Types: Cigarettes    Last Attempt to Quit: 05/16/2015  . Smokeless tobacco: None     Comment: none in 2 days (6.21.13)  . Alcohol Use: Yes     Comment: Last drink 17 months ago.    Allergies  Allergen Reactions  . Codeine Phosphate Other (See Comments)    REACTION: breathing difficulty  . Penicillins Rash and Other (See Comments)    Break out in sweats/ Rash amoxicillin. Denies airway involvement Has patient had a PCN reaction causing immediate rash, facial/tongue/throat swelling, SOB or lightheadedness with hypotension: Yes Has patient had a PCN reaction causing severe rash involving mucus membranes or skin necrosis: Yes Has patient had a PCN reaction that required hospitalization No Has patient had a PCN reaction occurring within the last 10 years: No If all of the above answers are "NO", then may proceed with Cephalosporin u    No orders of the defined types were placed in this encounter.    ROS  Per HPI   Objective   BP 104/71 mmHg  Pulse 101  Temp(Src) 98.3 F (36.8 C) (Oral)  Wt 171 lb (77.565 kg)  Orthostatic vitals  Lying  102/60 Sitting  110/80 Standing 110/84  Vital signs reviewed  General: Well appearing, no distress HEENT:   Head:  Normocephalic  Eyes: Pupils equal and reactive to light/accomodation. Extraocular movements intact  bilaterally.  Ears: Tympanic membranes normal bilaterally.  Nose/Throat: Nares patent bilaterally. Oropharnx clear and moist.  Neck: No cervical adenopathy bilaterally Respiratory/Chest: Clear to auscultation bilaterally. Unlabored work of breathing. No wheezing or rales. Cardiovascular: Regular rate and rhythm. Normal S1 and S2. No heart murmurs present. No extra heart sounds Neuro: Alert, oriented, no distress, CN intact, no dysmetria, romberg negative, normal gait  Assessment and Plan    1. Hyperglycemia - POCT glycosylated hemoglobin (Hb A1C)  2. Lightheadedness Unsure of etiology. Discussed with patient that this is a vague symptom. No symptoms that would make me think cardiac or neurologic. Possible related to blood pressure, although patient's orthostatic vitals are wnl. Can consider discontinuing verapamil to see if this improves symptoms. - BASIC METABOLIC PANEL WITH GFR - CBC with Differential/Platelet  3. Tubular adenoma of colon Seen on colonoscopy in 2011. Should be followed up in 5-10 years. Will refer back. - Ambulatory referral to Gastroenterology  4. Vision decreased Patient previously seen for his vision. Unsure if he's ever seen an ophthalmologist. Decreased vision, however, has evidence of CKD, likely secondary to hypertension. Cannot exclude hypertension playing a role in his vision issues. - Ambulatory referral to Ophthalmology

## 2015-12-05 NOTE — Patient Instructions (Signed)
Thank you for coming to see me today. It was a pleasure. Today we talked about:   Lightheadedness: I am unsure of what is causing this. I will check your electrolytes and check your blood sugar. Please continue to drink plenty of water. Your blood pressure appears normal today. If you would like, we can try discontinuing one of your blood pressure medication to see if this helps. If you have any associated heart beat racing or chest pain with these episodes, please follow-up immediately.  Please make an appointment to see me in 1-2 months for follow-up.  If you have any questions or concerns, please do not hesitate to call the office at [phone].  Sincerely,  Cordelia Poche, MD

## 2015-12-06 LAB — CBC WITH DIFFERENTIAL/PLATELET
BASOS PCT: 0 % (ref 0–1)
Basophils Absolute: 0 10*3/uL (ref 0.0–0.1)
EOS ABS: 0.3 10*3/uL (ref 0.0–0.7)
Eosinophils Relative: 2 % (ref 0–5)
HCT: 37.6 % — ABNORMAL LOW (ref 39.0–52.0)
HEMOGLOBIN: 13 g/dL (ref 13.0–17.0)
LYMPHS ABS: 3.5 10*3/uL (ref 0.7–4.0)
Lymphocytes Relative: 28 % (ref 12–46)
MCH: 30.8 pg (ref 26.0–34.0)
MCHC: 34.6 g/dL (ref 30.0–36.0)
MCV: 89.1 fL (ref 78.0–100.0)
MONO ABS: 0.8 10*3/uL (ref 0.1–1.0)
MONOS PCT: 6 % (ref 3–12)
MPV: 10.6 fL (ref 8.6–12.4)
NEUTROS PCT: 64 % (ref 43–77)
Neutro Abs: 8 10*3/uL — ABNORMAL HIGH (ref 1.7–7.7)
PLATELETS: 248 10*3/uL (ref 150–400)
RBC: 4.22 MIL/uL (ref 4.22–5.81)
RDW: 13.1 % (ref 11.5–15.5)
WBC: 12.5 10*3/uL — ABNORMAL HIGH (ref 4.0–10.5)

## 2015-12-09 ENCOUNTER — Encounter: Payer: Self-pay | Admitting: Family Medicine

## 2015-12-13 DIAGNOSIS — Z86010 Personal history of colonic polyps: Secondary | ICD-10-CM | POA: Diagnosis not present

## 2015-12-13 DIAGNOSIS — K2270 Barrett's esophagus without dysplasia: Secondary | ICD-10-CM | POA: Diagnosis not present

## 2015-12-13 DIAGNOSIS — K5900 Constipation, unspecified: Secondary | ICD-10-CM | POA: Diagnosis not present

## 2015-12-13 DIAGNOSIS — R1010 Upper abdominal pain, unspecified: Secondary | ICD-10-CM | POA: Diagnosis not present

## 2015-12-20 ENCOUNTER — Encounter: Payer: Self-pay | Admitting: Neurology

## 2015-12-20 ENCOUNTER — Ambulatory Visit (INDEPENDENT_AMBULATORY_CARE_PROVIDER_SITE_OTHER): Payer: Medicare Other | Admitting: Neurology

## 2015-12-20 VITALS — BP 146/78 | HR 93 | Ht 67.0 in | Wt 169.0 lb

## 2015-12-20 DIAGNOSIS — I10 Essential (primary) hypertension: Secondary | ICD-10-CM

## 2015-12-20 DIAGNOSIS — M542 Cervicalgia: Secondary | ICD-10-CM | POA: Diagnosis not present

## 2015-12-20 DIAGNOSIS — F17200 Nicotine dependence, unspecified, uncomplicated: Secondary | ICD-10-CM

## 2015-12-20 DIAGNOSIS — G43009 Migraine without aura, not intractable, without status migrainosus: Secondary | ICD-10-CM

## 2015-12-20 MED ORDER — SUMATRIPTAN SUCCINATE 100 MG PO TABS: ORAL_TABLET | ORAL | Status: DC

## 2015-12-20 MED ORDER — TOPIRAMATE 50 MG PO TABS: 50.0000 mg | ORAL_TABLET | Freq: Two times a day (BID) | ORAL | Status: DC

## 2015-12-20 NOTE — Progress Notes (Signed)
NEUROLOGY FOLLOW UP OFFICE NOTE  Hayston Pingree [id number]  HISTORY OF PRESENT ILLNESS: Jaime Barnes is a 65 year old right-handed male with hypertension, Bipolar disorder, CKD, tobacco user, Barrett's esophagus and cirrhosis who follows up for migraine and neck pain.    UPDATE: Improved Duration:  5-10 minutes with sumatriptan 100mg  Frequency:  Once a week Current NSAIDS:  none Current analgesics:  none Current triptans:  sumatritan 100mg  Current anti-emetic:  none Current muscle relaxants:  cyclobenzaprine 10mg  Current anti-anxiolytic:  risperidone Current Antihypertensive medications:  Lisinopril 10mg , verapamil 120mg  three times daily Current Antidepressant medications:  none Current Anticonvulsant medications:  topiramate 50mg  twice daily, gabapentin 300mg  at bedtime  Despite cyclobenzaprine, he still notes some neck stiffness and discomfort  HISTORY: Onset:  Has remote history of migraines but they returned several months ago after experiencing increased stress related to his ex-girlfriend. Location:  Right sided Quality:  pounding Initial Intensity:  "50"/10 Aura:  no Prodrome:  no Associated symptoms:  Photophobia, phonophobia.  No nausea or visual disturbance Initial Duration:  Until takes medication Initial Frequency:  Up until one month ago, every other day.  No headache for past month since taking topiramate daily Triggers/exacerbating factors:  stress Relieving factors:  none Activity:  Cannot function  Past abortive medication:  Sumatriptan 50mg  (needed 3 tablets for it to work), ibuprofen Past preventative medication:  none Other past therapy:  none  CT of head from 07/19/15 was unremarkable.    MRI of brain from 12/04/14 showed global atrophy and mild small vessel ischemic changes.  PAST MEDICAL HISTORY: Past Medical History  Diagnosis Date  . Barrett's esophagus 2011    Recommend repeat EGD for surveillance in 2 yrs-[facility]  . Cirrhosis ([place])   .  Pancreatitis   . Pulmonary nodule   . Chronic kidney disease   . Hypertension   . Hyperlipidemia   . Bipolar disorder ([place])   . Diverticulosis of colon   . Impotence   . GANGLION CYST, TENDON SHEATH 06/26/2010  . Pulmonary nodule seen on imaging study 02/06/2011    Followup CT in December 2014 with 20 months of stability at 4 mm.  No further imaging recommended     MEDICATIONS: Current Outpatient Prescriptions on File Prior to Visit  Medication Sig Dispense Refill  . albuterol (PROVENTIL HFA) 108 (90 BASE) MCG/ACT inhaler Inhale 2 puffs into the lungs every 6 (six) hours as needed for wheezing. 1 Inhaler 0  . atorvastatin (LIPITOR) 40 MG tablet Take 1 tablet (40 mg total) by mouth daily. 90 tablet 3  . cyclobenzaprine (FLEXERIL) 10 MG tablet Take 1 tablet (10 mg total) by mouth 3 (three) times daily as needed. for muscle spams (Patient taking differently: Take 10 mg by mouth every morning. for muscle spams) 30 tablet 0  . gabapentin (NEURONTIN) 300 MG capsule Take 1 capsule (300 mg total) by mouth at bedtime. 30 capsule 5  . hydrOXYzine (ATARAX/VISTARIL) 25 MG tablet Take 25 mg by mouth at bedtime as needed (headaches).   2  . lisinopril (PRINIVIL,ZESTRIL) 10 MG tablet Take 1 tablet (10 mg total) by mouth daily. 90 tablet 3  . meclizine (ANTIVERT) 32 MG tablet Take 1 tablet (32 mg total) by mouth 3 (three) times daily as needed for dizziness. (Patient taking differently: Take 32 mg by mouth at bedtime as needed for dizziness. ) 90 tablet 0  . naproxen (NAPROSYN) 500 MG tablet Take 1 tablet (500 mg total) by mouth every 12 (twelve) hours as needed.  30 tablet 2  . omeprazole (PRILOSEC) 20 MG capsule Take 1 capsule (20 mg total) by mouth daily. 90 capsule 1  . polyethylene glycol (MIRALAX) packet Take 17 g by mouth 2 (two) times daily. Until daily soft stools, then decrease to once per day. (Patient taking differently: Take 17 g by mouth at bedtime. Until daily soft stools, then decrease to once  per day.) 30 each 0  . risperiDONE (RISPERDAL) 2 MG tablet at bedtime.   2  . thiothixene (NAVANE) 2 MG capsule   2  . traZODone (DESYREL) 100 MG tablet Take 1 tablet (100 mg total) by mouth at bedtime. 90 tablet 1  . verapamil (CALAN) 120 MG tablet TAKE 1 TABLET BY MOUTH THREE TIMES DAILY 90 tablet 0   No current facility-administered medications on file prior to visit.    ALLERGIES: Allergies  Allergen Reactions  . Codeine Phosphate Other (See Comments)    REACTION: breathing difficulty  . Penicillins Rash and Other (See Comments)    Break out in sweats/ Rash amoxicillin. Denies airway involvement Has patient had a PCN reaction causing immediate rash, facial/tongue/throat swelling, SOB or lightheadedness with hypotension: Yes Has patient had a PCN reaction causing severe rash involving mucus membranes or skin necrosis: Yes Has patient had a PCN reaction that required hospitalization No Has patient had a PCN reaction occurring within the last 10 years: No If all of the above answers are "NO", then may proceed with Cephalosporin u    FAMILY HISTORY: No family history on file.  SOCIAL HISTORY: Social History   Social History  . Marital Status: Legally Separated    Spouse Name: N/A  . Number of Children: N/A  . Years of Education: N/A   Occupational History  . Not on file.   Social History Main Topics  . Smoking status: Current Every Day Smoker -- 0.00 packs/day for 50 years    Types: Cigarettes    Last Attempt to Quit: 05/16/2015  . Smokeless tobacco: Not on file     Comment: none in 2 days (6.21.13)  . Alcohol Use: Yes     Comment: Last drink 17 months ago.  . Drug Use: Yes    Special: Marijuana     Comment: daily marijuana since teenager. Last 20 years ago.  [name] Sexual Activity: Yes    Birth Control/ Protection: Condom     Comment: Multiple sex partners. 60 in last year.   Other Topics Concern  . Not on file   Social History Narrative   Pt lives alone. No stairs  in home. Pt has completed 8th grade.     REVIEW OF SYSTEMS: Constitutional: No fevers, chills, or sweats, no generalized fatigue, change in appetite Eyes: No visual changes, double vision, eye pain Ear, nose and throat: No hearing loss, ear pain, nasal congestion, sore throat Cardiovascular: No chest pain, palpitations Respiratory:  No shortness of breath at rest or with exertion, wheezes GastrointestinaI: No nausea, vomiting, diarrhea, abdominal pain, fecal incontinence Genitourinary:  No dysuria, urinary retention or frequency Musculoskeletal:  Neck stiffness Integumentary: No rash, pruritus, skin lesions Neurological: as above Psychiatric: No depression, insomnia, anxiety Endocrine: No palpitations, fatigue, diaphoresis, mood swings, change in appetite, change in weight, increased thirst Hematologic/Lymphatic:  No anemia, purpura, petechiae. Allergic/Immunologic: no itchy/runny eyes, nasal congestion, recent allergic reactions, rashes  PHYSICAL EXAM: Filed Vitals:   12/20/15 0858  BP: 146/78  Pulse: 93   General: No acute distress.  Patient appears well-groomed.  normal body habitus. Head:  Normocephalic/atraumatic Eyes:  Fundoscopic exam unremarkable without vessel changes, exudates, hemorrhages or papilledema. Neck: supple, no paraspinal tenderness, full range of motion Heart:  Regular rate and rhythm Lungs:  Clear to auscultation bilaterally Back: No paraspinal tenderness Neurological Exam: alert and oriented to person, place, and time. Attention span and concentration intact, recent and remote memory intact, fund of knowledge intact.  Speech fluent and not dysarthric, language intact.  CN II-XII intact. Fundoscopic exam unremarkable without vessel changes, exudates, hemorrhages or papilledema.  Bulk and tone normal, muscle strength 5/5 throughout.  Sensation to light touch, temperature and vibration intact.  Deep tendon reflexes 2+ throughout, toes downgoing.  Finger to nose  and heel to shin testing intact.  Gait normal, Romberg negative.  IMPRESSION: Migraine without aura, stable Neck pain HTN Tobacco use  PLAN: 1.  Topamax 50mg  twice daily 2.  Sumatriptan 100mg  for abortive therapy 3.  Refer to PT for neck pain 4.  Smoking cessation 5.  BP recheck with PCP 6.  Follow up in 6 months.  15 minutes spent face to face with patient, over 50% spent discussing management.  Metta Clines, DO  CC:  Cordelia Poche, MD

## 2015-12-20 NOTE — Patient Instructions (Signed)
1.  Continue topamax 50mg  twice daily 2.  Take Imitrex at earliest onset of headache.  May repeat once in 2 hours if needed.  Do not exceed 2 tablets in 24 hours 3.  Continue to work on quitting smoking 4.  Will refer you to physical therapy for neck. 5.  Follow up in 6 months.

## 2015-12-28 ENCOUNTER — Other Ambulatory Visit: Payer: Self-pay | Admitting: Family Medicine

## 2016-01-28 ENCOUNTER — Other Ambulatory Visit: Payer: Self-pay | Admitting: *Deleted

## 2016-01-30 MED ORDER — VERAPAMIL HCL 120 MG PO TABS: 120.0000 mg | ORAL_TABLET | Freq: Three times a day (TID) | ORAL | Status: DC

## 2016-02-05 ENCOUNTER — Encounter: Payer: Self-pay | Admitting: Podiatry

## 2016-02-05 ENCOUNTER — Ambulatory Visit (INDEPENDENT_AMBULATORY_CARE_PROVIDER_SITE_OTHER): Payer: Medicare Other | Admitting: Podiatry

## 2016-02-05 DIAGNOSIS — B351 Tinea unguium: Secondary | ICD-10-CM | POA: Diagnosis not present

## 2016-02-05 DIAGNOSIS — M79673 Pain in unspecified foot: Secondary | ICD-10-CM | POA: Diagnosis not present

## 2016-02-05 NOTE — Progress Notes (Signed)
Patient ID: Jaime Barnes, male   DOB: 16-May-1951, 65 y.o.   MRN: [id number] Complaint:  Visit Type: Patient returns to my office for continued preventative foot care services. Complaint: Patient states" my nails have grown long and thick and become painful to walk and wear shoes" . The patient presents for preventative foot care services. No changes to ROS  Podiatric Exam: Vascular: dorsalis pedis and posterior tibial pulses are palpable bilateral. Capillary return is immediate. Temperature gradient is WNL. Skin turgor WNL  Sensorium: Normal Semmes Weinstein monofilament test. Normal tactile sensation bilaterally. Nail Exam: Pt has thick disfigured discolored nails with subungual debris noted bilateral entire nail hallux through fifth toenails Ulcer Exam: There is no evidence of ulcer or pre-ulcerative changes or infection. Orthopedic Exam: Muscle tone and strength are WNL. No limitations in general ROM. No crepitus or effusions noted. Foot type and digits show no abnormalities. Bony prominences are unremarkable. Skin: No Porokeratosis. No infection or ulcers  Diagnosis:  Onychomycosis, , Pain in right toe, pain in left toes  Treatment & Plan Procedures and Treatment: Consent by patient was obtained for treatment procedures. The patient understood the discussion of treatment and procedures well. All questions were answered thoroughly reviewed. Debridement of mycotic and hypertrophic toenails, 1 through 5 bilateral and clearing of subungual debris. No ulceration, no infection noted.  Discussed permanent nail excision left hallux toenail. Return Visit-Office Procedure: Patient instructed to return to the office for a follow up visit 3 months for continued evaluation and treatment.    Gardiner Barefoot DPM

## 2016-02-07 ENCOUNTER — Other Ambulatory Visit: Payer: Self-pay | Admitting: Gastroenterology

## 2016-02-07 DIAGNOSIS — K5900 Constipation, unspecified: Secondary | ICD-10-CM | POA: Diagnosis not present

## 2016-02-07 DIAGNOSIS — Z86010 Personal history of colonic polyps: Secondary | ICD-10-CM | POA: Diagnosis not present

## 2016-02-07 DIAGNOSIS — K219 Gastro-esophageal reflux disease without esophagitis: Secondary | ICD-10-CM | POA: Diagnosis not present

## 2016-02-07 DIAGNOSIS — R1084 Generalized abdominal pain: Secondary | ICD-10-CM | POA: Diagnosis not present

## 2016-02-07 DIAGNOSIS — K7030 Alcoholic cirrhosis of liver without ascites: Secondary | ICD-10-CM

## 2016-02-13 ENCOUNTER — Other Ambulatory Visit: Payer: Medicare Other

## 2016-02-26 ENCOUNTER — Ambulatory Visit
Admission: RE | Admit: 2016-02-26 | Discharge: 2016-02-26 | Disposition: A | Discharge status: Home / Self Care | Payer: Medicare Other | Source: Ambulatory Visit | Attending: Gastroenterology | Admitting: Gastroenterology

## 2016-02-26 DIAGNOSIS — K7460 Unspecified cirrhosis of liver: Secondary | ICD-10-CM | POA: Diagnosis not present

## 2016-02-26 DIAGNOSIS — K7030 Alcoholic cirrhosis of liver without ascites: Secondary | ICD-10-CM

## 2016-02-27 ENCOUNTER — Ambulatory Visit: Payer: Medicare Other | Attending: Neurology | Admitting: Rehabilitative and Restorative Service Providers"

## 2016-02-29 ENCOUNTER — Other Ambulatory Visit: Payer: Self-pay | Admitting: Family Medicine

## 2016-02-29 ENCOUNTER — Other Ambulatory Visit: Payer: Self-pay | Admitting: Neurology

## 2016-02-29 NOTE — Telephone Encounter (Signed)
12/20/15 06/23/16  

## 2016-03-19 ENCOUNTER — Other Ambulatory Visit: Payer: Self-pay | Admitting: Gastroenterology

## 2016-03-19 DIAGNOSIS — K219 Gastro-esophageal reflux disease without esophagitis: Secondary | ICD-10-CM | POA: Diagnosis not present

## 2016-03-19 DIAGNOSIS — K640 First degree hemorrhoids: Secondary | ICD-10-CM | POA: Diagnosis not present

## 2016-03-19 DIAGNOSIS — R1084 Generalized abdominal pain: Secondary | ICD-10-CM | POA: Diagnosis not present

## 2016-03-19 DIAGNOSIS — D126 Benign neoplasm of colon, unspecified: Secondary | ICD-10-CM | POA: Diagnosis not present

## 2016-03-19 DIAGNOSIS — D128 Benign neoplasm of rectum: Secondary | ICD-10-CM | POA: Diagnosis not present

## 2016-03-19 DIAGNOSIS — K621 Rectal polyp: Secondary | ICD-10-CM | POA: Diagnosis not present

## 2016-03-19 DIAGNOSIS — K7460 Unspecified cirrhosis of liver: Secondary | ICD-10-CM | POA: Diagnosis not present

## 2016-03-19 DIAGNOSIS — Z86010 Personal history of colonic polyps: Secondary | ICD-10-CM | POA: Diagnosis not present

## 2016-03-19 DIAGNOSIS — K5730 Diverticulosis of large intestine without perforation or abscess without bleeding: Secondary | ICD-10-CM | POA: Diagnosis not present

## 2016-04-06 ENCOUNTER — Other Ambulatory Visit: Payer: Self-pay | Admitting: Family Medicine

## 2016-04-29 ENCOUNTER — Ambulatory Visit: Payer: Medicare Other | Admitting: Family Medicine

## 2016-04-29 ENCOUNTER — Other Ambulatory Visit: Payer: Self-pay | Admitting: *Deleted

## 2016-05-05 ENCOUNTER — Other Ambulatory Visit: Payer: Self-pay | Admitting: *Deleted

## 2016-05-05 ENCOUNTER — Other Ambulatory Visit: Payer: Self-pay | Admitting: Family Medicine

## 2016-05-05 MED ORDER — LISINOPRIL 10 MG PO TABS: 10.0000 mg | ORAL_TABLET | Freq: Every day | ORAL | 0 refills | Status: DC

## 2016-05-05 NOTE — Telephone Encounter (Signed)
Please have this patient schedule f/u for BP.  He was supposed to follow up in June for BMP check, as his kidney function had not returned to baseline.  Lisinopril refilled x2 months.

## 2016-05-08 NOTE — Telephone Encounter (Signed)
Called number in [place]. VM was in spanish. If pt calls, please schedule an appt for him to be seen. I will send a letter asking him to call and schedule an appt. Ottis Stain, CMA

## 2016-05-13 ENCOUNTER — Ambulatory Visit: Payer: Medicare Other | Admitting: Podiatry

## 2016-05-13 ENCOUNTER — Telehealth: Payer: Self-pay | Admitting: Neurology

## 2016-05-13 NOTE — Telephone Encounter (Signed)
Called both lines, both went directly to VM. Message left for pt to return call.

## 2016-05-13 NOTE — Telephone Encounter (Signed)
Patient needs to talk to someone about medciation please call [phone] ior [phone]

## 2016-05-16 ENCOUNTER — Other Ambulatory Visit: Payer: Self-pay | Admitting: *Deleted

## 2016-05-16 NOTE — Telephone Encounter (Signed)
Called patient to discuss medication.  It appears his last refill was 01/2016 for a 30 day supply.  How is he taking this medication?  Also, last appt his BP was soft and he was being worked up for dizziness.  I wonder if his blood pressure is too controlled.  I'd like him to follow up with me before refilling this medication.  Please attempt to call patient back today.

## 2016-05-19 NOTE — Telephone Encounter (Signed)
LMOVM for return call .Jaime Barnes, Salome Spotted, CMA

## 2016-05-23 ENCOUNTER — Ambulatory Visit (INDEPENDENT_AMBULATORY_CARE_PROVIDER_SITE_OTHER): Payer: Medicare Other | Admitting: Podiatry

## 2016-05-23 ENCOUNTER — Encounter: Payer: Self-pay | Admitting: Podiatry

## 2016-05-23 DIAGNOSIS — M79673 Pain in unspecified foot: Secondary | ICD-10-CM | POA: Diagnosis not present

## 2016-05-23 DIAGNOSIS — B351 Tinea unguium: Secondary | ICD-10-CM | POA: Diagnosis not present

## 2016-05-23 NOTE — Progress Notes (Signed)
Patient ID: Jaime Barnes, male   DOB: 01-15-1951, 65 y.o.   MRN: [id number] Complaint:  Visit Type: Patient returns to my office for continued preventative foot care services. Complaint: Patient states" my nails have grown long and thick and become painful to walk and wear shoes" . The patient presents for preventative foot care services. No changes to ROS  Podiatric Exam: Vascular: dorsalis pedis and posterior tibial pulses are palpable bilateral. Capillary return is immediate. Temperature gradient is WNL. Skin turgor WNL  Sensorium: Normal Semmes Weinstein monofilament test. Normal tactile sensation bilaterally. Nail Exam: Pt has thick disfigured discolored nails with subungual debris noted bilateral entire nail hallux through fifth toenails Ulcer Exam: There is no evidence of ulcer or pre-ulcerative changes or infection. Orthopedic Exam: Muscle tone and strength are WNL. No limitations in general ROM. No crepitus or effusions noted. Foot type and digits show no abnormalities. Bony prominences are unremarkable. Skin: No Porokeratosis. No infection or ulcers  Diagnosis:  Onychomycosis, , Pain in right toe, pain in left toes  Treatment & Plan Procedures and Treatment: Consent by patient was obtained for treatment procedures. The patient understood the discussion of treatment and procedures well. All questions were answered thoroughly reviewed. Debridement of mycotic and hypertrophic toenails, 1 through 5 bilateral and clearing of subungual debris. No ulceration, no infection noted.  Discussed permanent nail excision left hallux toenail. Return Visit-Office Procedure: Patient instructed to return to the office for a follow up visit 3 months for continued evaluation and treatment.    Gardiner Barefoot DPM

## 2016-05-27 NOTE — Telephone Encounter (Signed)
LMOVM for return call again.  Would you like to write a letter Dr. Lajuana Ripple?  Pershing Skidmore, Salome Spotted, CMA

## 2016-05-28 NOTE — Telephone Encounter (Signed)
Yes, please.

## 2016-05-28 NOTE — Telephone Encounter (Signed)
Letter mailed requesting pt to call and schedule appointment before any refills. Jaime Barnes, April D, [place]

## 2016-06-03 IMAGING — CT CT HEAD W/O CM
2 series · 16 of 30 positions shown, 20 images · non-contrast
Comparison: 12/04/2014

CLINICAL DATA: Severe headache.  Hypertension.

EXAM:
CT HEAD WITHOUT CONTRAST
TECHNIQUE: Contiguous axial images were obtained from the base of the skull
through the vertex without intravenous contrast.

[Series 2: head w/o · axial · non-contrast · 0.45mm/px · z∈[-135,-5]mm · 13 of 32 slices shown, 17 images]
[im 3/32  brain]
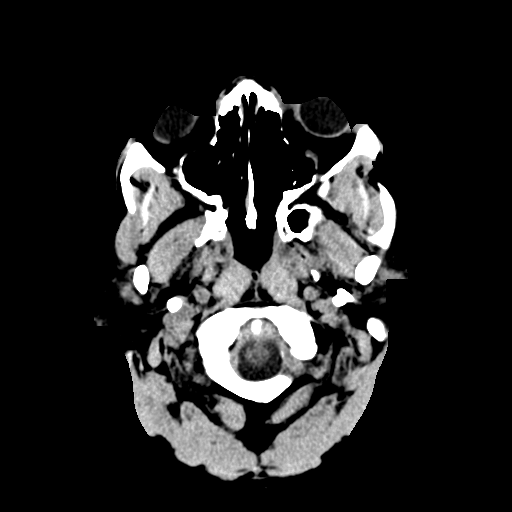
[im 3/32  bone]
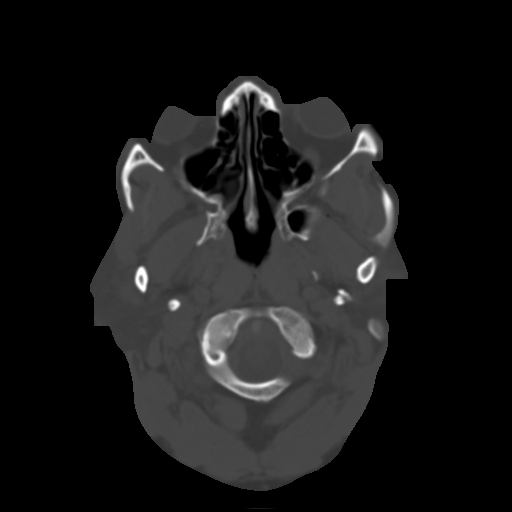
[im 5/32  brain]
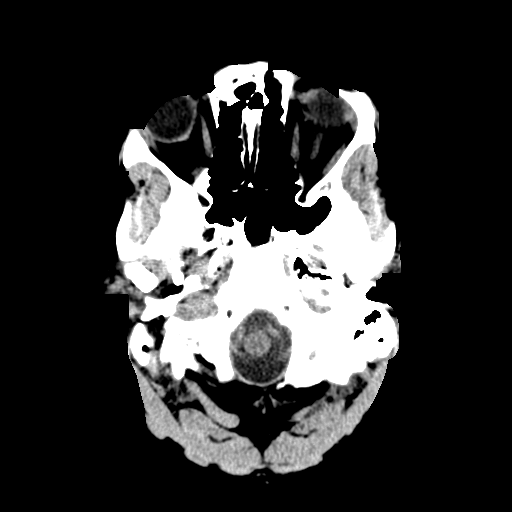
[im 7/32  brain]
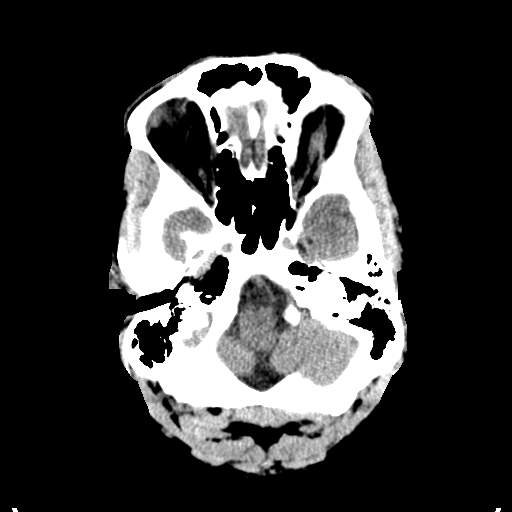
[im 9/32  brain]
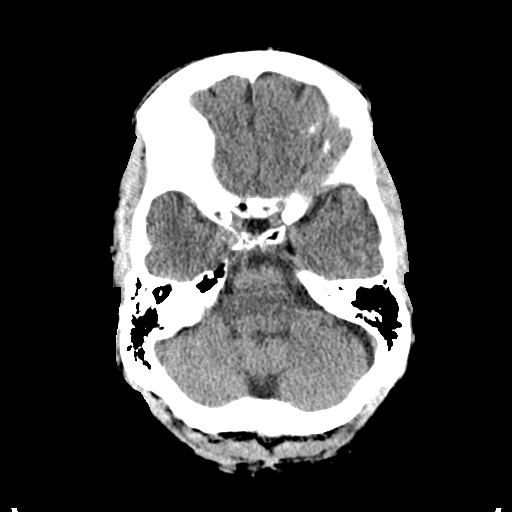
[im 12/32  brain]
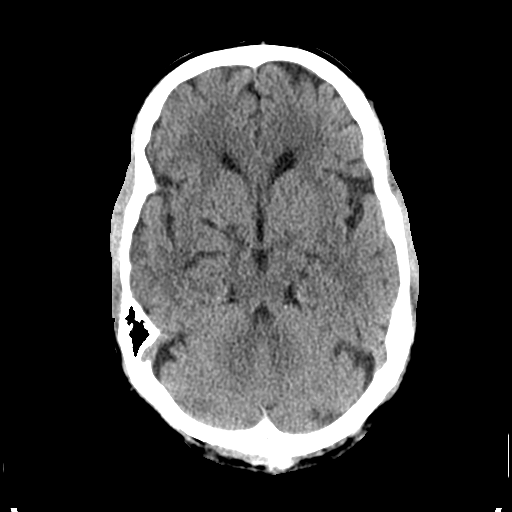
[im 12/32  bone]
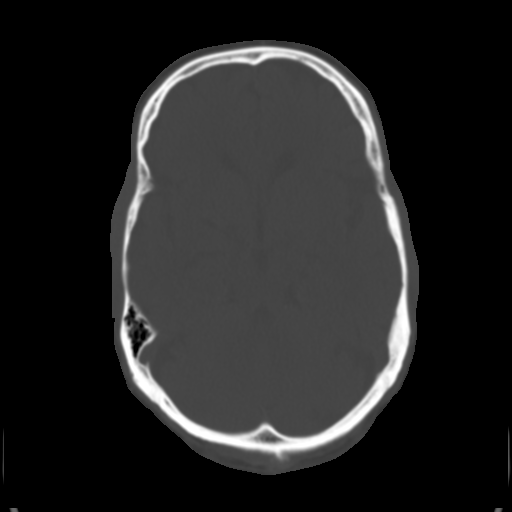
[im 14/32  brain]
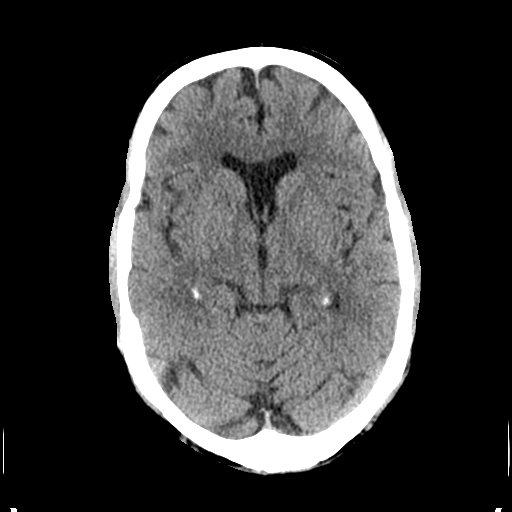
[im 16/32  brain]
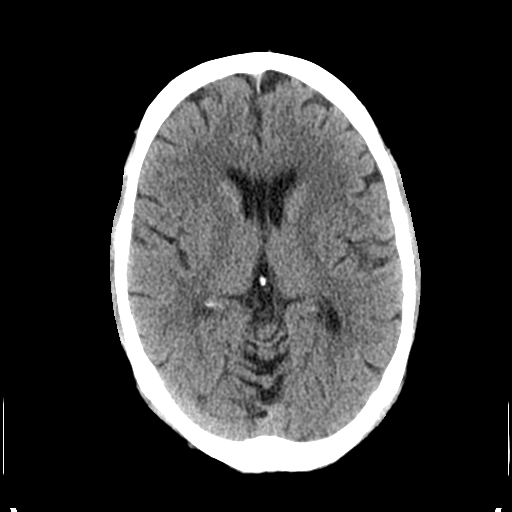
[im 18/32  brain]
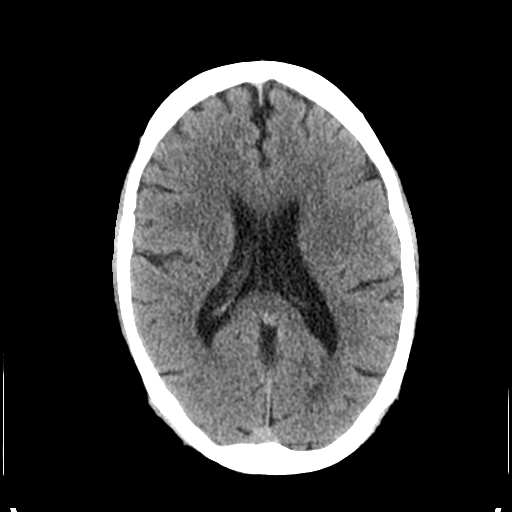
[im 20/32  brain]
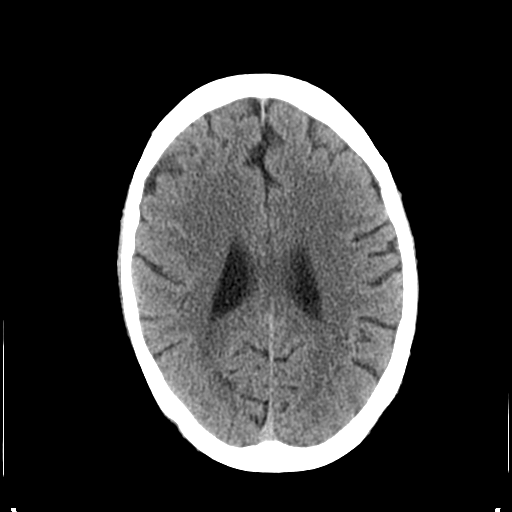
[im 20/32  bone]
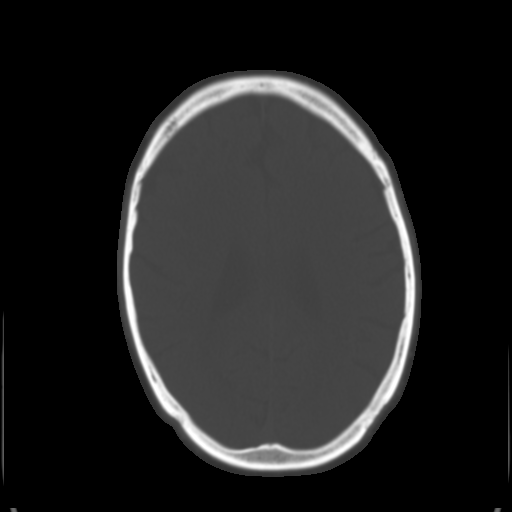
[im 23/32  brain]
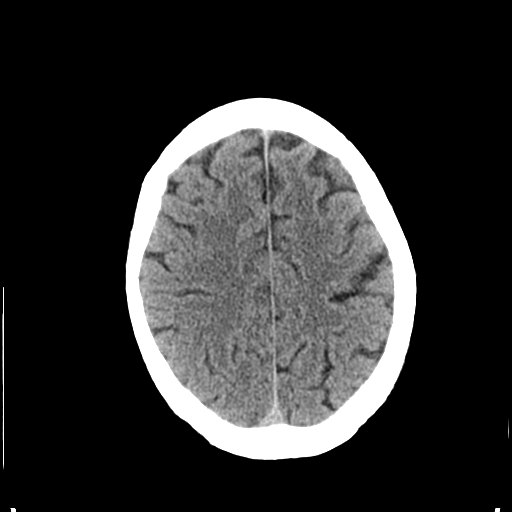
[im 25/32  brain]
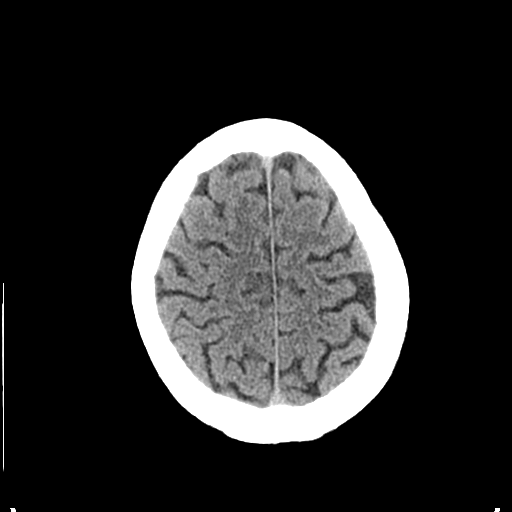
[im 27/32  brain]
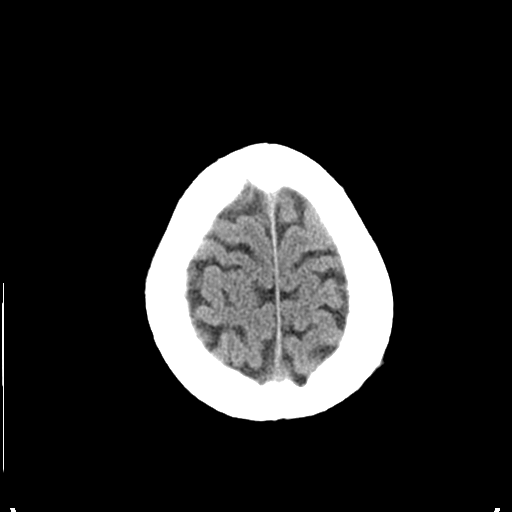
[im 29/32  brain]
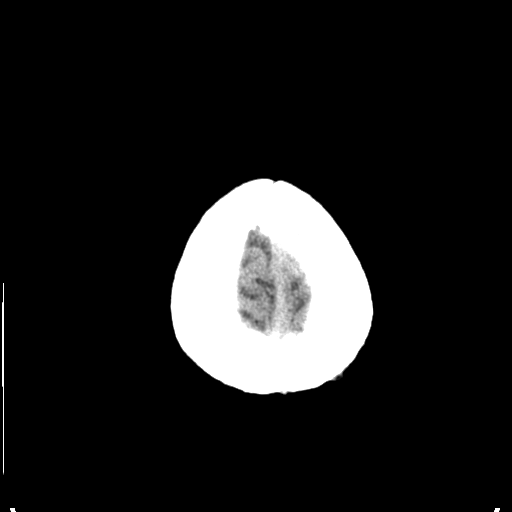
[im 29/32  bone]
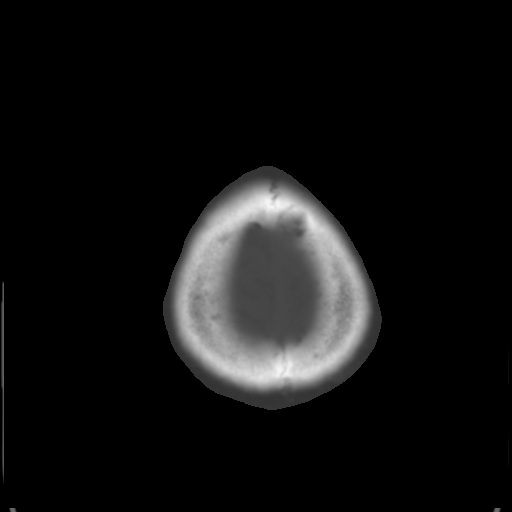

[Series 3: bone windows · axial · 0.45mm/px · z∈[-135,-90]mm · 3 of 32 slices shown]
[im 3/32  bone]
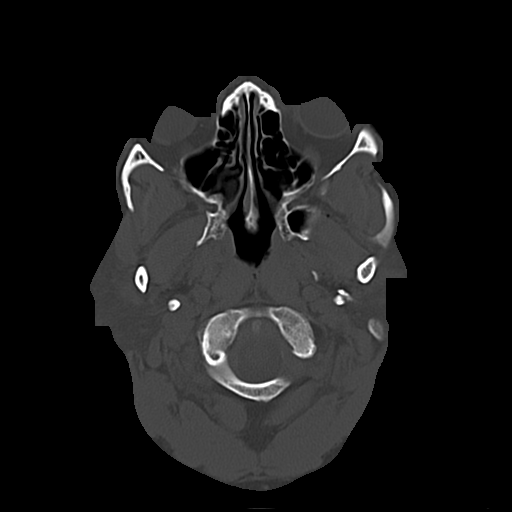
[im 7/32  bone]
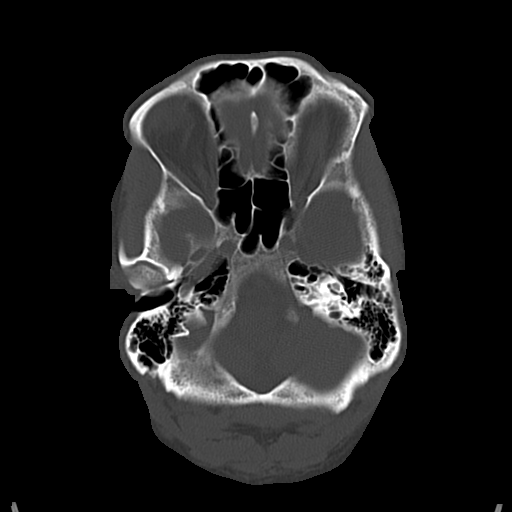
[im 12/32  bone]
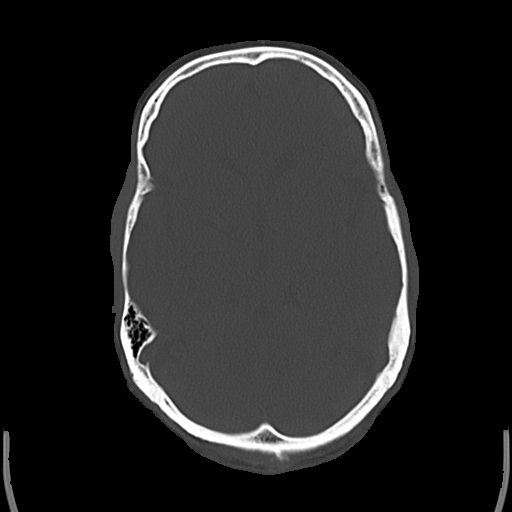

[16 of 30 positions shown; findings below may reference images not displayed]

FINDINGS: No evidence of intracranial hemorrhage, brain edema, or other signs
of acute infarction. No evidence of intracranial mass lesion or mass
effect.

No abnormal extraaxial fluid collections identified. Ventricles are
normal in size. No skull abnormality identified.
IMPRESSION: Negative noncontrast head CT.

## 2016-06-18 ENCOUNTER — Encounter: Payer: Self-pay | Admitting: Family Medicine

## 2016-06-18 ENCOUNTER — Ambulatory Visit (INDEPENDENT_AMBULATORY_CARE_PROVIDER_SITE_OTHER): Payer: Medicare Other | Admitting: Family Medicine

## 2016-06-18 VITALS — BP 140/64 | HR 58 | Temp 98.8°F | Ht 66.0 in | Wt 156.0 lb

## 2016-06-18 DIAGNOSIS — M792 Neuralgia and neuritis, unspecified: Secondary | ICD-10-CM | POA: Diagnosis not present

## 2016-06-18 DIAGNOSIS — R748 Abnormal levels of other serum enzymes: Secondary | ICD-10-CM | POA: Diagnosis not present

## 2016-06-18 DIAGNOSIS — E785 Hyperlipidemia, unspecified: Secondary | ICD-10-CM

## 2016-06-18 DIAGNOSIS — K219 Gastro-esophageal reflux disease without esophagitis: Secondary | ICD-10-CM | POA: Diagnosis not present

## 2016-06-18 DIAGNOSIS — M545 Low back pain, unspecified: Secondary | ICD-10-CM

## 2016-06-18 DIAGNOSIS — Z1159 Encounter for screening for other viral diseases: Secondary | ICD-10-CM

## 2016-06-18 DIAGNOSIS — I10 Essential (primary) hypertension: Secondary | ICD-10-CM

## 2016-06-18 DIAGNOSIS — R7989 Other specified abnormal findings of blood chemistry: Secondary | ICD-10-CM

## 2016-06-18 MED ORDER — OMEPRAZOLE 20 MG PO CPDR: 20.0000 mg | DELAYED_RELEASE_CAPSULE | Freq: Every day | ORAL | 1 refills | Status: DC

## 2016-06-18 MED ORDER — POLYETHYLENE GLYCOL 3350 17 G PO PACK: 17.0000 g | PACK | Freq: Every day | ORAL | 5 refills | Status: DC

## 2016-06-18 MED ORDER — VERAPAMIL HCL 120 MG PO TABS: 120.0000 mg | ORAL_TABLET | Freq: Three times a day (TID) | ORAL | 3 refills | Status: DC

## 2016-06-18 MED ORDER — GABAPENTIN 300 MG PO CAPS: 300.0000 mg | ORAL_CAPSULE | Freq: Every day | ORAL | 3 refills | Status: DC

## 2016-06-18 MED ORDER — ATORVASTATIN CALCIUM 40 MG PO TABS: 40.0000 mg | ORAL_TABLET | Freq: Every day | ORAL | 3 refills | Status: DC

## 2016-06-18 MED ORDER — LISINOPRIL 10 MG PO TABS: ORAL_TABLET | ORAL | 3 refills | Status: DC

## 2016-06-18 NOTE — Patient Instructions (Addendum)
You can take Tylenol if needed for your back pain.  DO NOT take Motrin/Advil/Aleve/Naproxen/Ibuprofen.   Back Pain, Adult Back pain is very common. The pain often gets better over time. The cause of back pain is usually not dangerous. Most people can learn to manage their back pain on their own.  HOME CARE  Watch your back pain for any changes. The following actions may help to lessen any pain you are feeling:  Stay active. Start with short walks on flat ground if you can. Try to walk farther each day.  Exercise regularly as told by your doctor. Exercise helps your back heal faster. It also helps avoid future injury by keeping your muscles strong and flexible.  Do not sit, drive, or stand in one place for more than 30 minutes.  Do not stay in bed. Resting more than 1-2 days can slow down your recovery.  Be careful when you bend or lift an object. Use good form when lifting:  Bend at your knees.  Keep the object close to your body.  Do not twist.  Sleep on a firm mattress. Lie on your side, and bend your knees. If you lie on your back, put a pillow under your knees.  Take medicines only as told by your doctor.  Put ice on the injured area.  Put ice in a plastic bag.  Place a towel between your skin and the bag.  Leave the ice on for 20 minutes, 2-3 times a day for the first 2-3 days. After that, you can switch between ice and heat packs.  Avoid feeling anxious or stressed. Find good ways to deal with stress, such as exercise.  Maintain a healthy weight. Extra weight puts stress on your back. GET HELP IF:   You have pain that does not go away with rest or medicine.  You have worsening pain that goes down into your legs or buttocks.  You have pain that does not get better in one week.  You have pain at night.  You lose weight.  You have a fever or chills. GET HELP RIGHT AWAY IF:   You cannot control when you poop (bowel movement) or pee (urinate).  Your arms or  legs feel weak.  Your arms or legs lose feeling (numbness).  You feel sick to your stomach (nauseous) or throw up (vomit).  You have belly (abdominal) pain.  You feel like you may pass out (faint).   This information is not intended to replace advice given to you by your health care provider. Make sure you discuss any questions you have with your health care provider.   Document Released: 03/03/2008 Document Revised: 10/06/2014 Document Reviewed: 01/17/2014 Elsevier Interactive Patient Education [employer].

## 2016-06-18 NOTE — Progress Notes (Signed)
    Subjective: CC: HTN/ meet new doctor/ med refills HPI: Jaime Barnes is a 65 y.o. male presenting to clinic today for follow up. Concerns today include:  1. Hypertension Blood pressure today: 140/64 Meds: Compliant with [facility], Lisinopril ROS: Denies headache, dizziness, visual changes, nausea, vomiting, chest pain, abdominal pain or shortness of breath.  2. Low back pain Patient reports low back pain that started about 3 weeks ago.  It is right sided.  He reports that is started out of the blue.  He reports that it is worse coming downhill.  He notes that he is able to go uphill without difficulty.  No falls, numbness/tingling, hematuria, saddle anesthesia, urinary retention/ fecal incontinence.  Was on Neurontin but ran out.  Has been taking Motrin.    3. Tobacco use d/o Patient is an active smoker (4-5 [employer] daily).  He reports that he is ready to stop smoking and has been cutting back.  He denies cough, SOB.  4. GERD Patient reports acid reflux symptoms off of Prilosec.  He notes that he has been taking Prilosec 20 mg BID since the beginning of the year.  Social History Reviewed: active smoker (4-5 [employer] daily). FamHx and MedHx reviewed.  Please see EMR. Health Maintenance: declined flu shot  ROS: Per HPI  Objective: Office vital signs reviewed. BP 140/64   Pulse (!) 58   Temp 98.8 F (37.1 C) (Oral)   Ht 5\' 6"  (1.676 m)   Wt 156 lb (70.8 kg)   SpO2 96%   BMI 25.18 kg/m   Physical Examination:  General: Awake, alert, well nourished, No acute distress HEENT: Normal, MMM Cardio: regular rate and rhythm, S1S2 heard, no murmurs appreciated Pulm: clear to auscultation bilaterally, no wheezes, rhonchi or rales GI: soft, non-tender, non-distended, bowel sounds present x4, no masses Spine: Full painless AROM, normal curvature, no midline TTP, no paraspinal TTP, no CVA TTP MSK: Normal gait and station Skin: dry, intact, no rashes or lesions Neuro: light  touch sensation in tact  Assessment/ Plan: 65 y.o. male   1. Elevated serum creatinine. ?excessive NSAID use vs secondary to uncontrolled HTN - BASIC METABOLIC PANEL WITH GFR; Future - Avoid NSAIDs - Hydrate - Take medications as directed to control HTN - If persistently elevated will plan to obtain renal u/s, ANA, UA to evaluate for protein  2. Neuropathic pain - gabapentin (NEURONTIN) 300 MG capsule; Take 1 capsule (300 mg total) by mouth at bedtime.  Dispense: 90 capsule; Refill: 3  3. Hyperlipidemia - atorvastatin (LIPITOR) 40 MG tablet; Take 1 tablet (40 mg total) by mouth daily.  Dispense: 90 tablet; Refill: 3  4. Gastroesophageal reflux disease without esophagitis - omeprazole (PRILOSEC) 20 MG capsule; Take 1 capsule (20 mg total) by mouth daily.  Dispense: 90 capsule; Refill: 1  5. HYPERTENSION, BENIGN SYSTEMIC - Continue current medications - Smoking cessation encouraged - Patient to schedule appt with Dr Valentina Lucks for PFTs and smoking cessation  6. Need for hepatitis C screening test - Hepatitis C antibody; Future  7. Right-sided low back pain without sciatica.  No deficits on exam.   - Advised AGAINST taking NSAIDS. Tylenol ok - resume Gabapentin   Janora Norlander, DO PGY-3, [facility]

## 2016-06-19 ENCOUNTER — Telehealth: Payer: Self-pay | Admitting: *Deleted

## 2016-06-19 ENCOUNTER — Other Ambulatory Visit: Payer: Self-pay | Admitting: Family Medicine

## 2016-06-19 MED ORDER — POLYETHYLENE GLYCOL 3350 17 G PO PACK: 17.0000 g | PACK | Freq: Two times a day (BID) | ORAL | 5 refills | Status: DC | PRN

## 2016-06-19 NOTE — Telephone Encounter (Signed)
Received fax from pharmacy, they need clarification on patients miralax.    Message from pharmacy: "Please Clarify SIG on [place] it say QD and QD... I think it was maybe BID at first?"  Please change and resend Rx. Farida Mcreynolds, Salome Spotted, CMA

## 2016-06-19 NOTE — Telephone Encounter (Signed)
That was an error.  It should say BID.  I have sent in a new rx to reflect this

## 2016-06-19 NOTE — Telephone Encounter (Signed)
Thanks Dr. Lajuana Ripple. Francella Barnett, Salome Spotted, CMA

## 2016-06-21 ENCOUNTER — Other Ambulatory Visit: Payer: Self-pay | Admitting: Neurology

## 2016-06-23 ENCOUNTER — Other Ambulatory Visit (INDEPENDENT_AMBULATORY_CARE_PROVIDER_SITE_OTHER): Payer: Medicare Other

## 2016-06-23 ENCOUNTER — Ambulatory Visit (INDEPENDENT_AMBULATORY_CARE_PROVIDER_SITE_OTHER): Payer: Medicare Other | Admitting: Neurology

## 2016-06-23 ENCOUNTER — Encounter: Payer: Self-pay | Admitting: Neurology

## 2016-06-23 ENCOUNTER — Telehealth: Payer: Self-pay

## 2016-06-23 VITALS — HR 74 | Ht 66.0 in | Wt 156.0 lb

## 2016-06-23 DIAGNOSIS — F17200 Nicotine dependence, unspecified, uncomplicated: Secondary | ICD-10-CM

## 2016-06-23 DIAGNOSIS — M542 Cervicalgia: Secondary | ICD-10-CM | POA: Diagnosis not present

## 2016-06-23 DIAGNOSIS — G43009 Migraine without aura, not intractable, without status migrainosus: Secondary | ICD-10-CM

## 2016-06-23 LAB — COMPREHENSIVE METABOLIC PANEL
ALBUMIN: 3.9 g/dL (ref 3.5–5.2)
ALT: 10 U/L (ref 0–53)
AST: 12 U/L (ref 0–37)
Alkaline Phosphatase: 90 U/L (ref 39–117)
BILIRUBIN TOTAL: 0.4 mg/dL (ref 0.2–1.2)
BUN: 9 mg/dL (ref 6–23)
CALCIUM: 9.1 mg/dL (ref 8.4–10.5)
CHLORIDE: 102 meq/L (ref 96–112)
CO2: 28 meq/L (ref 19–32)
CREATININE: 1.16 mg/dL (ref 0.40–1.50)
GFR: 66.97 mL/min (ref >60.00)
GLUCOSE: 83 mg/dL (ref 70–99)
Potassium: 3.4 mEq/L — ABNORMAL LOW (ref 3.5–5.1)
SODIUM: 139 meq/L (ref 135–145)
Total Protein: 7.1 g/dL (ref 6.0–8.3)

## 2016-06-23 LAB — SEDIMENTATION RATE: Sed Rate: 25 mm/hr — ABNORMAL HIGH (ref 0–20)

## 2016-06-23 MED ORDER — TIZANIDINE HCL 4 MG PO TABS: 4.0000 mg | ORAL_TABLET | Freq: Every evening | ORAL | 2 refills | Status: DC | PRN

## 2016-06-23 MED ORDER — TOPIRAMATE 50 MG PO TABS: ORAL_TABLET | ORAL | 3 refills | Status: DC

## 2016-06-23 MED ORDER — SUMATRIPTAN SUCCINATE 100 MG PO TABS: ORAL_TABLET | ORAL | 2 refills | Status: DC

## 2016-06-23 NOTE — Progress Notes (Addendum)
NEUROLOGY FOLLOW UP OFFICE NOTE  Jaime Barnes [id number]  HISTORY OF PRESENT ILLNESS: Jaime Barnes is a 65 year old right-handed male with hypertension, Bipolar disorder, CKD, tobacco user, Barrett's esophagus and cirrhosis who follows up for migraine and neck pain.     UPDATE: Headaches were doing well except for the past 2 months, they have increased.  He never went to PT.  He still has neck and shoulder pain. Duration:  Briefly with sumatriptan Frequency:  daily Current NSAIDS:  none Current analgesics:  none Current triptans:  sumatritan 100mg  Current anti-emetic:  none Current muscle relaxants:  no Current anti-anxiolytic:  risperidone Current Antihypertensive medications:  Lisinopril 10mg , verapamil 120mg  three times daily Current Antidepressant medications:  none Current Anticonvulsant medications:  topiramate 50mg  twice daily, gabapentin 300mg  at bedtime   HISTORY: Onset:  Has remote history of migraines but they returned several months ago after experiencing increased stress related to his ex-girlfriend. Location:  Right sided Quality:  pounding Initial Intensity:  "50"/10 Aura:  no Prodrome:  no Associated symptoms:  Photophobia, phonophobia.  No nausea or visual disturbance Initial Duration:  Until takes medication; March: 5-10 minutes with sumatriptan Initial Frequency:  every other day; March: once a week Triggers/exacerbating factors:  stress Relieving factors:  none Activity:  Cannot function   Past abortive medication:  Sumatriptan 50mg  (needed 3 tablets for it to work), ibuprofen Past preventative medication:  none Other past therapy:  none   CT of head from 07/19/15 was unremarkable.    MRI of brain from 12/04/14 showed global atrophy and mild small vessel ischemic changes.  PAST MEDICAL HISTORY: Past Medical History:  Diagnosis Date  . Barrett's esophagus 2011   Recommend repeat EGD for surveillance in 2 yrs-[facility]  . Bipolar disorder ([place])   .  Chronic kidney disease   . Cirrhosis ([place])   . Diverticulosis of colon   . GANGLION CYST, TENDON SHEATH 06/26/2010  . Hyperlipidemia   . Hypertension   . Impotence   . Pancreatitis   . Pulmonary nodule   . Pulmonary nodule seen on imaging study 02/06/2011   Followup CT in December 2014 with 20 months of stability at 4 mm.  No further imaging recommended     MEDICATIONS: Current Outpatient Prescriptions on File Prior to Visit  Medication Sig Dispense Refill  . albuterol (PROVENTIL HFA) 108 (90 BASE) MCG/ACT inhaler Inhale 2 puffs into the lungs every 6 (six) hours as needed for wheezing. 1 Inhaler 0  . atorvastatin (LIPITOR) 40 MG tablet Take 1 tablet (40 mg total) by mouth daily. 90 tablet 3  . gabapentin (NEURONTIN) 300 MG capsule Take 1 capsule (300 mg total) by mouth at bedtime. 90 capsule 3  . hydrOXYzine (ATARAX/VISTARIL) 25 MG tablet Take 25 mg by mouth at bedtime as needed (headaches).   2  . lisinopril (PRINIVIL,ZESTRIL) 10 MG tablet TAKE 1 TABLET(10 MG) BY MOUTH DAILY 90 tablet 3  . meclizine (ANTIVERT) 32 MG tablet Take 1 tablet (32 mg total) by mouth 3 (three) times daily as needed for dizziness. (Patient taking differently: Take 32 mg by mouth at bedtime as needed for dizziness. ) 90 tablet 0  . naproxen (NAPROSYN) 500 MG tablet Take 1 tablet (500 mg total) by mouth every 12 (twelve) hours as needed. 30 tablet 2  . omeprazole (PRILOSEC) 20 MG capsule Take 1 capsule (20 mg total) by mouth daily. 90 capsule 1  . polyethylene glycol (MIRALAX) packet Take 17 g by mouth 2 (two) times  daily as needed for moderate constipation. Until daily soft stools, then decrease to once per day. 255 each 5  . risperiDONE (RISPERDAL) 2 MG tablet at bedtime.   2  . thiothixene (NAVANE) 2 MG capsule   2  . topiramate (TOPAMAX) 50 MG tablet Take 1 tablet (50 mg total) by mouth 2 (two) times daily. 180 tablet 1  . traZODone (DESYREL) 100 MG tablet Take 1 tablet (100 mg total) by mouth at bedtime. 90  tablet 1  . verapamil (CALAN) 120 MG tablet Take 1 tablet (120 mg total) by mouth 3 (three) times daily. 90 tablet 3  . cyclobenzaprine (FLEXERIL) 10 MG tablet Take 1 tablet (10 mg total) by mouth 3 (three) times daily as needed. for muscle spams (Patient not taking: Reported on 06/23/2016) 30 tablet 0  . [DISCONTINUED] verapamil (CALAN) 120 MG tablet Take 120 mg by mouth 3 (three) times daily.      No current facility-administered medications on file prior to visit.     ALLERGIES: Allergies  Allergen Reactions  . Codeine Phosphate Other (See Comments)    REACTION: breathing difficulty  . Penicillins Rash and Other (See Comments)    Break out in sweats/ Rash amoxicillin. Denies airway involvement Has patient had a PCN reaction causing immediate rash, facial/tongue/throat swelling, SOB or lightheadedness with hypotension: Yes Has patient had a PCN reaction causing severe rash involving mucus membranes or skin necrosis: Yes Has patient had a PCN reaction that required hospitalization No Has patient had a PCN reaction occurring within the last 10 years: No If all of the above answers are "NO", then may proceed with Cephalosporin u    FAMILY HISTORY: History reviewed. No pertinent family history.  SOCIAL HISTORY: Social History   Social History  . Marital status: Legally Separated    Spouse name: N/A  . Number of children: N/A  . Years of education: N/A   Occupational History  . Not on file.   Social History Main Topics  . Smoking status: Current Every Day Smoker    Packs/day: 0.00    Years: 50.00    Types: Cigarettes    Last attempt to quit: 05/16/2015  . Smokeless tobacco: Never Used     Comment: none in 2 days (6.21.13)  . Alcohol use Yes     Comment: Last drink 17 months ago.  . Drug use:     Types: Marijuana     Comment: daily marijuana since teenager. Last 20 years ago.  [name] Sexual activity: Yes    Birth control/ protection: Condom     Comment: Multiple sex partners.  60 in last year.   Other Topics Concern  . Not on file   Social History Narrative   Pt lives alone. No stairs in home. Pt has completed 8th grade.     REVIEW OF SYSTEMS: Constitutional: No fevers, chills, or sweats, no generalized fatigue, change in appetite Eyes: No visual changes, double vision, eye pain Ear, nose and throat: No hearing loss, ear pain, nasal congestion, sore throat Cardiovascular: No chest pain, palpitations Respiratory:  No shortness of breath at rest or with exertion, wheezes GastrointestinaI: No nausea, vomiting, diarrhea, abdominal pain, fecal incontinence Genitourinary:  No dysuria, urinary retention or frequency Musculoskeletal:  Neck pain Integumentary: No rash, pruritus, skin lesions Neurological: as above Psychiatric: No depression, insomnia, anxiety Endocrine: No palpitations, fatigue, diaphoresis, mood swings, change in appetite, change in weight, increased thirst Hematologic/Lymphatic:  No purpura, petechiae. Allergic/Immunologic: no itchy/runny eyes, nasal congestion, recent allergic  reactions, rashes  PHYSICAL EXAM: Vitals:   06/23/16 1010  Pulse: 74  BP:     112/60 General: No acute distress.  Patient appears well-groomed.  normal body habitus. Head:  Normocephalic/atraumatic Eyes:  Fundi examined but not visualized Neck: supple, mild bilateral paraspinal tenderness, full range of motion Heart:  Regular rate and rhythm Lungs:  Clear to auscultation bilaterally Back: No paraspinal tenderness Neurological Exam: alert and oriented to person, place, and time. Attention span and concentration intact, recent and remote memory intact, fund of knowledge intact.  Speech fluent and not dysarthric, language intact.  CN II-XII intact. Bulk and tone normal, muscle strength 5/5 throughout.  Sensation to light touch  intact.  Deep tendon reflexes 2+ throughout.  Finger to nose testing intact.  Gait normal, Romberg negative.  IMPRESSION: Worsening  migraine Neck pain Tobacco use  PLAN: 1.  Will check Sed Rate and CMP.  If renal function is okay, will likely increase topiramate.   2.  Sumatriptan for abortive therapy 3.  Tizanidine 4mg  at bedtime as needed for neck pain. 4.  PT for neck 5.  Currently working on smoking cessation (down to 2 cigarettes per day) 6.  Follow up in 4 months but he is to contact me in 6 weeks with update.  25 minutes spent face to face with patient, over 50% spent counseling.  ADDENDUM:  CMP normal.  Sed Rate 25.  Will increase topiramate to 50mg  in AM and 100mg  at bedtime.  Metta Clines, DO  CC:  Ronnie Doss, DO

## 2016-06-23 NOTE — Telephone Encounter (Signed)
-----   Message from Pieter Partridge, DO sent at 06/23/2016  3:16 PM EDT ----- Labs look okay.  We can increase topiramate to 50mg  in AM and 100mg  at bedtime.  He should contact us in 4 weeks with update.

## 2016-06-23 NOTE — Patient Instructions (Signed)
1.  We will check CMP and Sed Rate.  Will make changes to medication based on labs. 2.  Sumatriptan 100mg  at earliest onset of headache.  May repeat once if needed in 2 hours.  Do not exceed 2 tablets in 24 hours 3.  Take tizanidine 4mg  at bedtime if needed for neck/shoulder pain.  Caution for drowsiness 4.  Physical therapy 5.  Follow up in 4 months but contact me in 6 weeks with update.

## 2016-06-23 NOTE — Telephone Encounter (Signed)
Attempted to reach pt. No vm answered. New RX was sent to pharmacy. Will attempt to reach pt again tomorrow.

## 2016-06-26 ENCOUNTER — Ambulatory Visit (INDEPENDENT_AMBULATORY_CARE_PROVIDER_SITE_OTHER): Payer: Medicare Other | Admitting: Pharmacist

## 2016-06-26 ENCOUNTER — Encounter: Payer: Self-pay | Admitting: Pharmacist

## 2016-06-26 DIAGNOSIS — I10 Essential (primary) hypertension: Secondary | ICD-10-CM | POA: Diagnosis not present

## 2016-06-26 DIAGNOSIS — F17200 Nicotine dependence, unspecified, uncomplicated: Secondary | ICD-10-CM

## 2016-06-26 NOTE — Assessment & Plan Note (Signed)
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 Encouraged patient's selection of hot balls to aid smoking cessation. Counseled on importance of portion control for comfort foods and encouraged patient to purchase apples, moderately-sized packages of popcorn and cookies to prevent unhealthy weight gain. PFTs to be conducted once patient has quit smoking.

## 2016-06-26 NOTE — Progress Notes (Signed)
   S:  Patient arrives in fair spirits, accompanied by his girlfriend, Carlus Pavlov, and her mother.   Patient arrives for evaluation/assistance with tobacco dependence.  Patient was referred on 06/18/16 by Dr. Lajuana Ripple.  Patient was last seen by Primary Care Provider on 06/18/16.   Age when started using tobacco on a daily basis: 12 years. Number of Cigarettes per day 6 cigarettes. Brand smoked: [employer]. Estimated Nicotine Content per Cigarette: 1.2 mg  Estimated Nicotine intake per day 72 mg.    Most recent quit attempt: last month (successful for 1 week) Longest time ever been tobacco free: 6 months (5-6 years ago).  Therapies used in prior in past cessation efforts include: hot balls and nicotine patch. Patient plans to purchase hot balls and comfort foods/snacks (popcorn, cookies, etc.) to assist him with current effort to quit smoking. Desires to gain weight with this current cessation effort due to weight loss over the past couple months (171lbs in 11/2015).  Rates IMPORTANCE of quitting tobacco on 1-10 scale of 10. Rates CONFIDENCE of quitting tobacco for on 1-10 scale of 10. Motivation to quit: improved breathing and overall health status.  Patient has set today as his Quit Date.  Patient reports discontinuing all psych medications including risperidone 2 mg, thiothixene 2 mg, and trazodone 100 mg due to side effects of "feeling weird."  This has been more than 1 year ago and he states he has been stable.  Patient also denies use of tizanidine or topiramate, despite recent topiramate increase with Dr. Tomi Likens (seen 06/23/16).   Overall adherence to medications: fair (reports once daily dosing for all medications, despite verapamil 120mg  to be taken TID).   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 Encouraged patient's selection of hot balls to aid smoking cessation. Counseled on importance of portion control for comfort foods and encouraged patient to purchase apples, moderately-sized packages of popcorn and cookies to prevent unhealthy weight gain. PFTs to be conducted once patient has quit smoking.   HTN: Elevated in clinic today (BP 152/82) but controlled at previous visits. Continue to monitor and assess therapy changes at future visits. Continue lisinopril 10 mg. Continue verapamil 120 mg TID. Counseled patient on thrice daily dosing of verapamil. Consider therapy change to once-daily dosed CCB such as amlodipine 5 mg.   Bipolar: Reassess need/use of antipsychotics at a future visit.   Health maintenance: Significant unintentional weight loss of twenty pounds within past 6 months (~10% of TBW). Monitor weight.   Written information provided.  F/U phone call in one week. F/U with PCP in 2 months.   Total time in face-to-face counseling 15 minutes.  Patient seen with Mechele Dawley, PharmD Candidate.

## 2016-06-26 NOTE — Patient Instructions (Addendum)
Today is your Quit Day! Congratulations! Make sure to buy Hot Balls today to help you quit smoking. Try to buy smaller packets of your "comfort foods" to prevent too much weight gain.   We will call you in 1 week to follow up. Follow up with Dr. Lajuana Ripple in 2 months.

## 2016-06-26 NOTE — Assessment & Plan Note (Signed)
HTN: Elevated in clinic today (BP 152/82) but controlled at previous visits. Continue to monitor and assess therapy changes at future visits. Continue lisinopril 10 mg. Continue verapamil 120 mg TID. Counseled patient on thrice daily dosing of verapamil. Consider therapy change to once-daily dosed CCB such as amlodipine 5 mg.

## 2016-06-27 NOTE — Progress Notes (Signed)
Patient ID: Jaime Barnes, male   DOB: Feb 05, 1951, 65 y.o.   MRN: [id number]  Reviewed: Agree with Dr. Graylin Shiver documentation and management.

## 2016-07-04 ENCOUNTER — Telehealth: Payer: Self-pay | Admitting: Pharmacist

## 2016-07-04 NOTE — Telephone Encounter (Signed)
Call to follow up with tobacco cessation.   Patient reports complete abstinence from tobacco.   State the use of "hot balls" from the [employer] tree store have helped tremendously.   He appears to be at a high level of confidence for continued abstinence. Encouraged continued abstinence.   Plan to follow up in 1-2 weeks via the phone.

## 2016-08-01 ENCOUNTER — Encounter: Payer: Self-pay | Admitting: Podiatry

## 2016-08-01 ENCOUNTER — Ambulatory Visit (INDEPENDENT_AMBULATORY_CARE_PROVIDER_SITE_OTHER): Payer: Medicare Other | Admitting: Podiatry

## 2016-08-01 VITALS — Ht 66.0 in | Wt 156.0 lb

## 2016-08-01 DIAGNOSIS — B351 Tinea unguium: Secondary | ICD-10-CM

## 2016-08-01 DIAGNOSIS — M79676 Pain in unspecified toe(s): Secondary | ICD-10-CM

## 2016-08-01 NOTE — Progress Notes (Signed)
Patient ID: Jaime Barnes, male   DOB: 06/20/1951, 65 y.o.   MRN: [id number] Complaint:  Visit Type: Patient returns to my office for continued preventative foot care services. Complaint: Patient states" my nails have grown long and thick and become painful to walk and wear shoes" . The patient presents for preventative foot care services. No changes to ROS  Podiatric Exam: Vascular: dorsalis pedis and posterior tibial pulses are palpable bilateral. Capillary return is immediate. Temperature gradient is WNL. Skin turgor WNL  Sensorium: Normal Semmes Weinstein monofilament test. Normal tactile sensation bilaterally. Nail Exam: Pt has thick disfigured discolored nails with subungual debris noted bilateral entire nail hallux through fifth toenails Ulcer Exam: There is no evidence of ulcer or pre-ulcerative changes or infection. Orthopedic Exam: Muscle tone and strength are WNL. No limitations in general ROM. No crepitus or effusions noted. Foot type and digits show no abnormalities. Bony prominences are unremarkable. Skin: No Porokeratosis. No infection or ulcers  Diagnosis:  Onychomycosis, , Pain in right toe, pain in left toes  Treatment & Plan Procedures and Treatment: Consent by patient was obtained for treatment procedures. The patient understood the discussion of treatment and procedures well. All questions were answered thoroughly reviewed. Debridement of mycotic and hypertrophic toenails, 1 through 5 bilateral and clearing of subungual debris. No ulceration, no infection noted.  Discussed permanent nail excision left hallux toenail. Return Visit-Office Procedure: Patient instructed to return to the office for a follow up visit 3 months for continued evaluation and treatment.    Gardiner Barefoot DPM

## 2016-08-29 ENCOUNTER — Ambulatory Visit: Payer: Medicare Other | Admitting: Podiatry

## 2016-08-29 ENCOUNTER — Other Ambulatory Visit: Payer: Self-pay | Admitting: Neurology

## 2016-08-29 ENCOUNTER — Other Ambulatory Visit: Payer: Self-pay | Admitting: Family Medicine

## 2016-08-29 NOTE — Telephone Encounter (Signed)
Rx sent 

## 2016-09-01 ENCOUNTER — Other Ambulatory Visit: Payer: Self-pay | Admitting: *Deleted

## 2016-09-01 DIAGNOSIS — E785 Hyperlipidemia, unspecified: Secondary | ICD-10-CM

## 2016-09-01 MED ORDER — VERAPAMIL HCL 120 MG PO TABS: 120.0000 mg | ORAL_TABLET | Freq: Three times a day (TID) | ORAL | 3 refills | Status: DC

## 2016-09-01 MED ORDER — ATORVASTATIN CALCIUM 40 MG PO TABS: 40.0000 mg | ORAL_TABLET | Freq: Every day | ORAL | 3 refills | Status: DC

## 2016-09-09 ENCOUNTER — Ambulatory Visit (INDEPENDENT_AMBULATORY_CARE_PROVIDER_SITE_OTHER): Payer: Medicare Other | Admitting: Family Medicine

## 2016-09-09 ENCOUNTER — Encounter: Payer: Self-pay | Admitting: Family Medicine

## 2016-09-09 VITALS — BP 146/82 | HR 74 | Temp 98.3°F | Ht 66.0 in | Wt 164.8 lb

## 2016-09-09 DIAGNOSIS — M5441 Lumbago with sciatica, right side: Secondary | ICD-10-CM | POA: Diagnosis not present

## 2016-09-09 DIAGNOSIS — M792 Neuralgia and neuritis, unspecified: Secondary | ICD-10-CM

## 2016-09-09 MED ORDER — GABAPENTIN 300 MG PO CAPS: 300.0000 mg | ORAL_CAPSULE | Freq: Two times a day (BID) | ORAL | 3 refills | Status: DC

## 2016-09-09 NOTE — Progress Notes (Signed)
   Subjective: CC: back pain/ leg pain Jaime Barnes is a 65 y.o. male presenting to clinic today for same day appointment. PCP: Ronnie Doss, DO Concerns today include:  1. RLE pain He reports that he turned funny and fell just after Thanksgiving.  He tripped over an open dishwasher door but did not fall on his behind, he stumbled and caught himself.  He reports that pain starts in his hamstring and radiates to glutes.  He describes pain as a twisting pain.  He reports that he was taking the muscle relaxer prescribed by his Neurologist with some relief.  He denies excessive sedating.  Denies falls since.   Social History Reviewed. FamHx and MedHx reviewed.  Please see EMR.  ROS: Per HPI  Objective: Office vital signs reviewed. BP (!) 146/82   Pulse 74   Temp 98.3 F (36.8 C) (Oral)   Ht 5\' 6"  (1.676 m)   Wt 164 lb 12.8 oz (74.8 kg)   SpO2 97%   BMI 26.60 kg/m   Physical Examination:  General: Awake, alert, well nourished, No acute distress Extremities: warm, well perfused, No edema, cyanosis or clubbing; +2 pulses bilaterally MSK: antalgic gait, no midline TTP to spine, + TTP to paraspinal muscles in the lumbosacral area on the right. Neuro: LE Strength 5/5 and light touch sensation grossly intact, patellar DTRs 2/4  Assessment/ Plan: 65 y.o. male   1. Acute right-sided low back pain with right-sided sciatica. No red flags - Continue Zanaflex prn spasm as prescribed by neurologist - Increase Neurontin - Stay mobile - Handout for low back pain given - Return precautions reviewed.  If persistent pain in next 2 weeks, will plan to refer to PT  2. Neuropathic pain - gabapentin (NEURONTIN) 300 MG capsule; Take 1 capsule (300 mg total) by mouth 2 (two) times daily.  Dispense: 180 capsule; Refill: [place], DO PGY-3, [facility]

## 2016-09-09 NOTE — Patient Instructions (Addendum)
I have increased your Gabapentin to twice daily.  Your other prescriptions should be ready for pick up.  Continue to take the Tizanidine at night as prescribed by your neurologist.  Consider applying a muscle rub to the area.  If no improvement in the next couple of weeks, come back and see me.  We will send you to Physical Therapy if needed.  Stay mobile.

## 2016-09-12 ENCOUNTER — Other Ambulatory Visit: Payer: Self-pay | Admitting: Gastroenterology

## 2016-09-12 DIAGNOSIS — K7030 Alcoholic cirrhosis of liver without ascites: Secondary | ICD-10-CM

## 2016-10-06 ENCOUNTER — Ambulatory Visit: Payer: Medicare Other

## 2016-10-10 ENCOUNTER — Ambulatory Visit: Payer: Medicare Other | Admitting: Podiatry

## 2016-10-13 ENCOUNTER — Ambulatory Visit (INDEPENDENT_AMBULATORY_CARE_PROVIDER_SITE_OTHER): Payer: Medicare Other | Admitting: Family Medicine

## 2016-10-13 ENCOUNTER — Encounter: Payer: Self-pay | Admitting: Family Medicine

## 2016-10-13 VITALS — BP 128/66 | HR 83 | Temp 98.0°F | Ht 66.0 in | Wt 169.4 lb

## 2016-10-13 DIAGNOSIS — F17200 Nicotine dependence, unspecified, uncomplicated: Secondary | ICD-10-CM

## 2016-10-13 DIAGNOSIS — B9789 Other viral agents as the cause of diseases classified elsewhere: Secondary | ICD-10-CM

## 2016-10-13 DIAGNOSIS — R0609 Other forms of dyspnea: Secondary | ICD-10-CM | POA: Diagnosis not present

## 2016-10-13 DIAGNOSIS — J069 Acute upper respiratory infection, unspecified: Secondary | ICD-10-CM

## 2016-10-13 DIAGNOSIS — R0600 Dyspnea, unspecified: Secondary | ICD-10-CM

## 2016-10-13 MED ORDER — ALBUTEROL SULFATE HFA 108 (90 BASE) MCG/ACT IN AERS: 2.0000 | INHALATION_SPRAY | Freq: Four times a day (QID) | RESPIRATORY_TRACT | 0 refills | Status: DC | PRN

## 2016-10-13 NOTE — Patient Instructions (Addendum)
Please schedule an appointment with Dr Valentina Lucks for PFTs/ COPD evaluation.  Chronic Obstructive Pulmonary Disease Chronic obstructive pulmonary disease (COPD) is a common lung condition in which airflow from the lungs is limited. COPD is a general term that can be used to describe many different lung problems that limit airflow, including both chronic bronchitis and emphysema. If you have COPD, your lung function will probably never return to normal, but there are measures you can take to improve lung function and make yourself feel better. What are the causes?  Smoking (common).  Exposure to secondhand smoke.  Genetic problems.  Chronic inflammatory lung diseases or recurrent infections. What are the signs or symptoms?  Shortness of breath, especially with physical activity.  Deep, persistent (chronic) cough with a large amount of thick mucus.  Wheezing.  Rapid breaths (tachypnea).  Gray or bluish discoloration (cyanosis) of the skin, especially in your fingers, toes, or lips.  Fatigue.  Weight loss.  Frequent infections or episodes when breathing symptoms become much worse (exacerbations).  Chest tightness. How is this diagnosed? Your health care provider will take a medical history and perform a physical examination to diagnose COPD. Additional tests for COPD may include:  Lung (pulmonary) function tests.  Chest X-ray.  CT scan.  Blood tests. How is this treated? Treatment for COPD may include:  Inhaler and nebulizer medicines. These help manage the symptoms of COPD and make your breathing more comfortable.  Supplemental oxygen. Supplemental oxygen is only helpful if you have a low oxygen level in your blood.  Exercise and physical activity. These are beneficial for nearly all people with COPD.  Lung surgery or transplant.  Nutrition therapy to gain weight, if you are underweight.  Pulmonary rehabilitation. This may involve working with a team of health care  providers and specialists, such as respiratory, occupational, and physical therapists. Follow these instructions at home:  Take all medicines (inhaled or pills) as directed by your health care provider.  Avoid over-the-counter medicines or cough syrups that dry up your airway (such as antihistamines) and slow down the elimination of secretions unless instructed otherwise by your health care provider.  If you are a smoker, the most important thing that you can do is stop smoking. Continuing to smoke will cause further lung damage and breathing trouble. Ask your health care provider for help with quitting smoking. He or she can direct you to community resources or hospitals that provide support.  Avoid exposure to irritants such as smoke, chemicals, and fumes that aggravate your breathing.  Use oxygen therapy and pulmonary rehabilitation if directed by your health care provider. If you require home oxygen therapy, ask your health care provider whether you should purchase a pulse oximeter to measure your oxygen level at home.  Avoid contact with individuals who have a contagious illness.  Avoid extreme temperature and humidity changes.  Eat healthy foods. Eating smaller, more frequent meals and resting before meals may help you maintain your strength.  Stay active, but balance activity with periods of rest. Exercise and physical activity will help you maintain your ability to do things you want to do.  Preventing infection and hospitalization is very important when you have COPD. Make sure to receive all the vaccines your health care provider recommends, especially the pneumococcal and influenza vaccines. Ask your health care provider whether you need a pneumonia vaccine.  Learn and use relaxation techniques to manage stress.  Learn and use controlled breathing techniques as directed by your health care provider. Controlled  breathing techniques include: 1. Pursed lip breathing. Start by  breathing in (inhaling) through your nose for 1 second. Then, purse your lips as if you were going to whistle and breathe out (exhale) through the pursed lips for 2 seconds. 2. Diaphragmatic breathing. Start by putting one hand on your abdomen just above your waist. Inhale slowly through your nose. The hand on your abdomen should move out. Then purse your lips and exhale slowly. You should be able to feel the hand on your abdomen moving in as you exhale.  Learn and use controlled coughing to clear mucus from your lungs. Controlled coughing is a series of short, progressive coughs. The steps of controlled coughing are: 1. Lean your head slightly forward. 2. Breathe in deeply using diaphragmatic breathing. 3. Try to hold your breath for 3 seconds. 4. Keep your mouth slightly open while coughing twice. 5. Spit any mucus out into a tissue. 6. Rest and repeat the steps once or twice as needed. Contact a health care provider if:  You are coughing up more mucus than usual.  There is a change in the color or thickness of your mucus.  Your breathing is more labored than usual.  Your breathing is faster than usual. Get help right away if:  You have shortness of breath while you are resting.  You have shortness of breath that prevents you from:  Being able to talk.  Performing your usual physical activities.  You have chest pain lasting longer than 5 minutes.  Your skin color is more cyanotic than usual.  You measure low oxygen saturations for longer than 5 minutes with a pulse oximeter. This information is not intended to replace advice given to you by your health care provider. Make sure you discuss any questions you have with your health care provider. Document Released: 06/25/2005 Document Revised: 02/21/2016 Document Reviewed: 05/12/2013 Elsevier Interactive Patient Education  2017 [employer].

## 2016-10-13 NOTE — Progress Notes (Signed)
    Subjective: CC: cold symptoms HPI: Jaime Barnes is a 66 y.o. male presenting to clinic today for:  1. Cold symptoms Patient reports light headedness that is relieved by lying down.  Patient reports congestion, cough, sneezing.  Denies fevers.  Using Halls to help with coughing.  He also has been using a cough syrup.  He denies sick contacts.  2. DOE Patient stopped smoking last fall.  He has been using fireballs to satisfy his oral fixation.  Social Hx reviewed: former smoker. MedHx, medications and allergies reviewed.  Please see EMR. ROS: Per HPI  Objective: Office vital signs reviewed. BP 128/66   Pulse 83   Temp 98 F (36.7 C) (Oral)   Ht 5\' 6"  (1.676 m)   Wt 169 lb 6.4 oz (76.8 kg)   SpO2 98%   BMI 27.34 kg/m   Physical Examination:  General: Awake, alert, well nourished, nontoxic appearing, smells of tobacco, No acute distress HEENT: Normal    Neck: No masses palpated. No lymphadenopathy    Ears: Tympanic membranes intact, normal light reflex, no erythema, no bulging    Eyes: PERRLA, EOMI, sclera white    Nose: nasal turbinates moist, scant clear nasal discharge    Throat: moist mucus membranes, no erythema, no tonsillar exudate.  Airway is patent Cardio: regular rate and rhythm, S1S2 heard, no murmurs appreciated Pulm: clear to auscultation bilaterally, no wheezes, rhonchi or rales; normal work of breathing on room air Ext: no edema  Assessment/ Plan: 66 y.o. male   1. Dyspnea on exertion.  No evidence of fluid overload.  I suspect he is having SOB with activity 2/2 possible undiagnosed COPD.  I have recommended that he see Dr Valentina Lucks for PFTs.  He will likely need to start maintenance inhalers.    - Albuterol HFA refilled, advised to use ONLY PRN.  2. TOBACCO USER - former  3. Viral URI with cough - supportive care - return precautions reviewed  Follow up prn  Janora Norlander, DO PGY-3, [facility]

## 2016-10-17 ENCOUNTER — Ambulatory Visit: Payer: Medicare Other | Admitting: Podiatry

## 2016-10-23 ENCOUNTER — Encounter: Payer: Self-pay | Admitting: Pharmacist

## 2016-10-23 ENCOUNTER — Ambulatory Visit (INDEPENDENT_AMBULATORY_CARE_PROVIDER_SITE_OTHER): Payer: Medicare Other | Admitting: Pharmacist

## 2016-10-23 DIAGNOSIS — I10 Essential (primary) hypertension: Secondary | ICD-10-CM

## 2016-10-23 DIAGNOSIS — F17200 Nicotine dependence, unspecified, uncomplicated: Secondary | ICD-10-CM | POA: Diagnosis not present

## 2016-10-23 NOTE — Patient Instructions (Addendum)
Continue albuterol as needed for shortness of breath or you may also use it before you start something strenous that may make you have shortness of breath.  Let us know if you have more shortness of breath  Congrats on being smoke free. Keep up the good work.   Check your blood pressure at home and write the values down to bring back to your next visit.    Followup with Dr Lajuana Ripple in 4-6 weeks.

## 2016-10-23 NOTE — Progress Notes (Signed)
   S:    Patient arrives in good spirits ambulating without assistance.    Presents for lung function evaluation.   Patient was referred on 10/13/2016.  Patient was last seen by Primary Care Provider on 10/13/2016.[name]  Patient reports breathing has been okay and he only needs albuterol occasionally. He reports he walked from [place] today and Patient denies shortness of breath when walking over. Patient states that he can keep up with his girlfriend who is about the same age as him. He reports that he does get SOB when walking the dog. Uses the albuterol before walking the dog.   Patient reports last dose of COPD medications was today at ~730 AM.   Patient smoked for about 50 years. Half a pack per day since age 57 years old.    O: mMRC score= 1 See Documentation Flowsheet - CAT/COPD for complete symptom scoring.  See "scanned report" or Documentation Flowsheet (discrete results - PFTs) for  Spirometry results. Patient provided good effort while attempting spirometry.    A/P: Spirometry evaluation without Bronchodilator reveals normal lung function.  Patient has been experiencing shortness of breath occasionally and taking albuterol as needed.  Will continue Albuterol as needed.  Instructed patient to call take albuterol prior to strenous activities and as needed.  Instructed him to call clinic if his shortness of breath increased.  Reviewed results of pulmonary function tests.  Pt verbalized understanding of results and education.    Hypertension currently uncontrolled at todays visit.  Lack of control may be due to patient walking from [place] or nonadherence as he is not taking his Verapamil 3 times daily as prescribed and only taking once daily.  Have instructed patient to monitor home BP and bring long to visit with PCP.    Tobacco Cessation: 26 pack year smoker who quit in 2017 utilizing [place] candy. Congratulated on cessation and encouraged to continue to remain smoke free.     Written pt instructions provided.  F/U Clinic visit in 4-6 weeks with Dr Lajuana Ripple.   Total time in face to face counseling 45 minutes.  Patient seen with Bennye Alm, PharmD, BCPS and Mikael Spray, PharmD Candidate.

## 2016-10-23 NOTE — Assessment & Plan Note (Signed)
Hypertension currently uncontrolled at todays visit.  Lack of control may be due to patient walking from [place] or nonadherence as he is not taking his Verapamil 3 times daily as prescribed and only taking once daily.  Have instructed patient to monitor home BP and bring long to visit with PCP.

## 2016-10-23 NOTE — Assessment & Plan Note (Addendum)
Spirometry evaluation without Bronchodilator reveals normal lung function.  Patient has been experiencing shortness of breath occasionally and taking albuterol as needed.  Will continue Albuterol as needed.  Instructed patient to call take albuterol prior to strenous activities and as needed.  Instructed him to call clinic if his shortness of breath increased.  Reviewed results of pulmonary function tests.  Pt verbalized understanding of results and education.     Tobacco Cessation: 26 pack year smoker who quit in 2017 utilizing [place] candy. Congratulated on cessation and encouraged to continue to remain smoke free.

## 2016-10-24 ENCOUNTER — Encounter: Payer: Self-pay | Admitting: Neurology

## 2016-10-24 ENCOUNTER — Telehealth: Payer: Self-pay | Admitting: *Deleted

## 2016-10-24 ENCOUNTER — Ambulatory Visit (INDEPENDENT_AMBULATORY_CARE_PROVIDER_SITE_OTHER): Payer: Medicare Other | Admitting: Neurology

## 2016-10-24 VITALS — BP 144/94 | HR 62 | Ht 66.0 in | Wt 171.5 lb

## 2016-10-24 DIAGNOSIS — Z79899 Other long term (current) drug therapy: Secondary | ICD-10-CM | POA: Diagnosis not present

## 2016-10-24 DIAGNOSIS — I10 Essential (primary) hypertension: Secondary | ICD-10-CM | POA: Diagnosis not present

## 2016-10-24 DIAGNOSIS — F17200 Nicotine dependence, unspecified, uncomplicated: Secondary | ICD-10-CM

## 2016-10-24 DIAGNOSIS — G43009 Migraine without aura, not intractable, without status migrainosus: Secondary | ICD-10-CM | POA: Diagnosis not present

## 2016-10-24 MED ORDER — TOPIRAMATE 50 MG PO TABS: 100.0000 mg | ORAL_TABLET | Freq: Two times a day (BID) | ORAL | 2 refills | Status: DC

## 2016-10-24 NOTE — Telephone Encounter (Signed)
AWV scheduled for 11/04/2016 at 1000  L. Silvano Rusk, RN, BSN

## 2016-10-24 NOTE — Progress Notes (Signed)
Patient ID: Teri Uva, male   DOB: 03-29-1951, 66 y.o.   MRN: [id number] Reviewed: I agree with Dr. Graylin Shiver documentation and management.

## 2016-10-24 NOTE — Progress Notes (Signed)
NEUROLOGY FOLLOW UP OFFICE NOTE  Aodhan Mccalley OQ:[id number]  HISTORY OF PRESENT ILLNESS: Faysal Bourret is a 66 year old right-handed male with hypertension, Bipolar disorder, CKD, tobacco user, Barrett's esophagus and cirrhosis who follows up for migraine.   UPDATE: He was headache-free until 2 weeks ago and then has been daily. Intensity:  10/10 Duration:  Briefly with sumatriptan Frequency:  daily Current NSAIDS:  none Current analgesics:  none Current triptans:  sumatriptan 100mg  Current anti-emetic:  none Current muscle relaxants:  tizanidine Current anti-anxiolytic:  risperidone Current Antihypertensive medications:  Lisinopril 10mg , verapamil 120mg  three times daily Current Antidepressant medications:  none Current Anticonvulsant medications:  topiramate 50mg  AM and 100mg  QHS, gabapentin 300mg  twice daily   HISTORY: Onset:  Has remote history of migraines but they returned several months ago after experiencing increased stress related to his ex-girlfriend. Location:  Right sided Quality:  pounding Initial Intensity:  "50"/10 Aura:  no Prodrome:  no Associated symptoms:  Photophobia, phonophobia.  No nausea or visual disturbance Initial Duration:  Until takes medication; March: 5-10 minutes with sumatriptan Initial Frequency:  every other day; March: once a week Triggers/exacerbating factors:  stress Relieving factors:  none Activity:  Cannot function   Past abortive medication:  Sumatriptan 50mg  (needed 3 tablets for it to work), ibuprofen Past preventative medication:  none Other past therapy:  none   CT of head from 07/19/15 was unremarkable.    MRI of brain from 12/04/14 showed global atrophy and mild small vessel ischemic changes. 06/23/16:  Sed rate 25  PAST MEDICAL HISTORY: Past Medical History:  Diagnosis Date  . Barrett's esophagus 2011   Recommend repeat EGD for surveillance in 2 yrs-[facility]  . Bipolar disorder ([place])   . Chronic kidney disease   .  Cirrhosis ([place])   . Diverticulosis of colon   . GANGLION CYST, TENDON SHEATH 06/26/2010  . Hyperlipidemia   . Hypertension   . Impotence   . Pancreatitis   . Pulmonary nodule   . Pulmonary nodule seen on imaging study 02/06/2011   Followup CT in December 2014 with 20 months of stability at 4 mm.  No further imaging recommended     MEDICATIONS: Current Outpatient Prescriptions on File Prior to Visit  Medication Sig Dispense Refill  . albuterol (PROVENTIL HFA) 108 (90 Base) MCG/ACT inhaler Inhale 2 puffs into the lungs every 6 (six) hours as needed for wheezing. 1 Inhaler 0  . atorvastatin (LIPITOR) 40 MG tablet Take 1 tablet (40 mg total) by mouth daily. 90 tablet 3  . gabapentin (NEURONTIN) 300 MG capsule Take 1 capsule (300 mg total) by mouth 2 (two) times daily. 180 capsule 3  . hydrOXYzine (ATARAX/VISTARIL) 25 MG tablet Take 25 mg by mouth at bedtime as needed (headaches).   2  . lisinopril (PRINIVIL,ZESTRIL) 10 MG tablet TAKE 1 TABLET(10 MG) BY MOUTH DAILY 90 tablet 3  . meclizine (ANTIVERT) 32 MG tablet Take 1 tablet (32 mg total) by mouth 3 (three) times daily as needed for dizziness. (Patient taking differently: Take 32 mg by mouth 2 (two) times daily as needed for dizziness. ) 90 tablet 0  . omeprazole (PRILOSEC) 20 MG capsule Take 1 capsule (20 mg total) by mouth daily. 90 capsule 1  . SUMAtriptan (IMITREX) 100 MG tablet Take 1tablet.  May repeat once in 2 hours if headache persists or recurs.  Do not exceed 2 tablets in 24 hrs. 10 tablet 2  . tiZANidine (ZANAFLEX) 4 MG tablet TAKE 1 TABLET(4 MG)  BY MOUTH AT BEDTIME AS NEEDED FOR MUSCLE SPASMS 30 tablet 0  . verapamil (CALAN) 120 MG tablet Take 1 tablet (120 mg total) by mouth 3 (three) times daily. (Patient taking differently: Take 120 mg by mouth daily. ) 90 tablet 3  . [DISCONTINUED] verapamil (CALAN) 120 MG tablet Take 120 mg by mouth 3 (three) times daily.      No current facility-administered medications on file prior to  visit.     ALLERGIES: Allergies  Allergen Reactions  . Codeine Phosphate Other (See Comments)    REACTION: breathing difficulty  . Penicillins Rash and Other (See Comments)    Break out in sweats/ Rash amoxicillin. Denies airway involvement Has patient had a PCN reaction causing immediate rash, facial/tongue/throat swelling, SOB or lightheadedness with hypotension: Yes Has patient had a PCN reaction causing severe rash involving mucus membranes or skin necrosis: Yes Has patient had a PCN reaction that required hospitalization No Has patient had a PCN reaction occurring within the last 10 years: No If all of the above answers are "NO", then may proceed with Cephalosporin u    FAMILY HISTORY: History reviewed. No pertinent family history.  SOCIAL HISTORY: Social History   Social History  . Marital status: Legally Separated    Spouse name: N/A  . Number of children: N/A  . Years of education: N/A   Occupational History  . Not on file.   Social History Main Topics  . Smoking status: Current Every Day Smoker    Packs/day: 0.25    Years: 53.00    Types: Cigarettes    Start date: 09/29/1962    Last attempt to quit: 05/16/2015  . Smokeless tobacco: Never Used     Comment: Previous max 6 cigs per day [place]  . Alcohol use Yes     Comment: Last drink 17 months ago.  . Drug use: Yes    Types: Marijuana     Comment: daily marijuana since teenager. Last 20 years ago.  [name] Sexual activity: Yes    Birth control/ protection: Condom     Comment: Multiple sex partners. 60 in last year.   Other Topics Concern  . Not on file   Social History Narrative   Pt lives alone. No stairs in home. Pt has completed 8th grade.     REVIEW OF SYSTEMS: Constitutional: No fevers, chills, or sweats, no generalized fatigue, change in appetite Eyes: No visual changes, double vision, eye pain Ear, nose and throat: No hearing loss, ear pain, nasal congestion, sore throat Cardiovascular: No chest pain,  palpitations Respiratory:  No shortness of breath at rest or with exertion, wheezes GastrointestinaI: No nausea, vomiting, diarrhea, abdominal pain, fecal incontinence Genitourinary:  No dysuria, urinary retention or frequency Musculoskeletal:  No neck pain, back pain Integumentary: No rash, pruritus, skin lesions Neurological: as above Psychiatric: No depression, insomnia, anxiety Endocrine: No palpitations, fatigue, diaphoresis, mood swings, change in appetite, change in weight, increased thirst Hematologic/Lymphatic:  No purpura, petechiae. Allergic/Immunologic: no itchy/runny eyes, nasal congestion, recent allergic reactions, rashes  PHYSICAL EXAM: Vitals:   10/24/16 0927  BP: (!) 144/94  Pulse: 62   General: No acute distress.  Patient appears well-groomed.  normal body habitus. Head:  Normocephalic/atraumatic Eyes:  Fundi examined but not visualized Neck: supple, no paraspinal tenderness, full range of motion Heart:  Regular rate and rhythm Lungs:  Clear to auscultation bilaterally Back: No paraspinal tenderness Neurological Exam: alert and oriented to person, place, and time. Attention span and concentration intact, recent and  remote memory intact, fund of knowledge intact.  Speech fluent and not dysarthric, language intact.  CN II-XII intact. Bulk and tone normal, muscle strength 5/5 throughout.  Sensation to light touch  intact.  Deep tendon reflexes 2+ throughout.  Finger to nose testing intact.  Gait normal  IMPRESSION: Migraine without aura, likely exacerbated by extreme change in weather HTN, borderline high today  PLAN: 1.  Increase topiramate to 100mg  twice daily (check BMP).  He also takes gabapentin and venlafaxine 2.  Sumatriptan as needed (limited to no more than 2 days out of the week) 3.  Limit use of pain relievers to no more than 2 days out of the week.  These medications include acetaminophen, ibuprofen, triptans and narcotics.  This will help reduce risk of  rebound headaches. 4.  Be aware of common food triggers such as processed sweets, processed foods with nitrites (such as deli meat, hot dogs, sausages), foods with MSG, alcohol (such as wine), chocolate, certain cheeses, certain fruits (dried fruits, some citrus fruit), vinegar, diet soda. 4.  Avoid caffeine 5.  Routine exercise 6.  Proper sleep hygiene 7.  Stay adequately hydrated with water 8.  Keep a headache diary. 9.  Maintain proper stress management. 10.  Do not skip meals. 11.  Consider supplements:  Magnesium citrate 400mg  to 600mg  daily, riboflavin 400mg , Coenzyme Q 10 100mg  three times daily 12.  Follow up in 3 months but contact me in 4 weeks with update. 13.  Quit smoking 14.  Follow up BP with PCP.  Metta Clines, DO  CC:  Ronnie Doss, DO

## 2016-10-24 NOTE — Patient Instructions (Addendum)
Migraine Recommendations: 1.  Increase topiramate 50mg  to two tablets twice daily.  Contact me in 4 weeks with update.  Will check BMP. 2.  Take sumatriptan at earliest onset of headache.  May repeat dose once in 2 hours if needed.  Do not exceed two tablets in 24 hours. 3.  Limit use of pain relievers to no more than 2 days out of the week.  These medications include acetaminophen, ibuprofen, triptans and narcotics.  This will help reduce risk of rebound headaches. 4.  Be aware of common food triggers such as processed sweets, processed foods with nitrites (such as deli meat, hot dogs, sausages), foods with MSG, alcohol (such as wine), chocolate, certain cheeses, certain fruits (dried fruits, some citrus fruit), vinegar, diet soda. 4.  Avoid caffeine 5.  Routine exercise 6.  Proper sleep hygiene 7.  Stay adequately hydrated with water 8.  Keep a headache diary. 9.  Maintain proper stress management. 10.  Do not skip meals. 11.  Consider supplements:  Magnesium citrate 400mg  to 600mg  daily, riboflavin 400mg , Coenzyme Q 10 100mg  three times daily 12.  Follow up in 3 months but contact me in 4 weeks with update. 13.  Quit smoking

## 2016-10-29 ENCOUNTER — Other Ambulatory Visit: Payer: Self-pay | Admitting: Family Medicine

## 2016-10-29 ENCOUNTER — Other Ambulatory Visit: Payer: Self-pay | Admitting: Neurology

## 2016-10-30 ENCOUNTER — Ambulatory Visit
Admission: RE | Admit: 2016-10-30 | Discharge: 2016-10-30 | Disposition: A | Discharge status: Home / Self Care | Payer: Medicare Other | Source: Ambulatory Visit | Attending: Gastroenterology | Admitting: Gastroenterology

## 2016-10-30 DIAGNOSIS — K7460 Unspecified cirrhosis of liver: Secondary | ICD-10-CM | POA: Diagnosis not present

## 2016-10-30 DIAGNOSIS — K7030 Alcoholic cirrhosis of liver without ascites: Secondary | ICD-10-CM

## 2016-11-03 ENCOUNTER — Encounter: Payer: Self-pay | Admitting: Family Medicine

## 2016-11-04 ENCOUNTER — Encounter: Payer: Self-pay | Admitting: *Deleted

## 2016-11-04 ENCOUNTER — Ambulatory Visit (INDEPENDENT_AMBULATORY_CARE_PROVIDER_SITE_OTHER): Payer: Medicare Other | Admitting: *Deleted

## 2016-11-04 VITALS — BP 150/94 | HR 66 | Temp 98.1°F | Ht 66.0 in | Wt 174.6 lb

## 2016-11-04 DIAGNOSIS — Z0000 Encounter for general adult medical examination without abnormal findings: Secondary | ICD-10-CM | POA: Diagnosis not present

## 2016-11-04 NOTE — Progress Notes (Signed)
Subjective:   Jaime Barnes is a 66 y.o. male who presents with male friend for an Initial Medicare Annual Wellness Visit.  Cardiac Risk Factors include: advanced age (>35mn, >>59women);dyslipidemia;hypertension;male gender;smoking/ tobacco exposure    Objective:    Today's Vitals   11/04/16 0954 11/04/16 1031  BP: (!) 176/100 (!) 150/94  Pulse: 66   Temp: 98.1 F (36.7 C)   SpO2: 95%   Weight: 174 lb 9.6 oz (79.2 kg)   Height: '5\' 6"'  (1.676 m)   PainSc: 0-No pain    Body mass index is 28.18 kg/m. BP recheck 150/94 left arm manually with adult cuff. Patient has not taken AM BP meds due to rushing. Patient encouraged to sched appt with PCP to discuss recent elevated BPs: Home checks have been 183-216/95-108  Current Medications (verified) Outpatient Encounter Prescriptions as of 11/04/2016  Medication Sig  . atorvastatin (LIPITOR) 40 MG tablet Take 1 tablet (40 mg total) by mouth daily.  .Marland Kitchengabapentin (NEURONTIN) 300 MG capsule Take 1 capsule (300 mg total) by mouth 2 (two) times daily.  . hydrOXYzine (ATARAX/VISTARIL) 25 MG tablet Take 25 mg by mouth at bedtime as needed (headaches).   .Marland Kitchenlisinopril (PRINIVIL,ZESTRIL) 10 MG tablet TAKE 1 TABLET(10 MG) BY MOUTH DAILY  . meclizine (ANTIVERT) 32 MG tablet Take 1 tablet (32 mg total) by mouth 3 (three) times daily as needed for dizziness. (Patient taking differently: Take 32 mg by mouth 2 (two) times daily as needed for dizziness. )  . omeprazole (PRILOSEC) 20 MG capsule Take 1 capsule (20 mg total) by mouth daily.  .Marland KitchenPROAIR HFA 108 (90 Base) MCG/ACT inhaler INHALE 2 PUFFS INTO THE LUNGS EVERY 6 HOURS AS NEEDED FOR WHEEZING  . SUMAtriptan (IMITREX) 100 MG tablet TAKE 1 TABLET BY MOUTH, MAY REPEAT ONCE IN 2 HOURS IF HEADACHE PERSISTS OR RECURS,DO NOT EXCEED 2 TABLETS IN 24 HOURS  . tiZANidine (ZANAFLEX) 4 MG tablet TAKE 1 TABLET(4 MG) BY MOUTH AT BEDTIME AS NEEDED FOR MUSCLE SPASMS  . topiramate (TOPAMAX) 50 MG tablet Take 2 tablets  (100 mg total) by mouth 2 (two) times daily. Take 1 tablet in the morning at 2 tablets at bedtime  . verapamil (CALAN) 120 MG tablet Take 1 tablet (120 mg total) by mouth 3 (three) times daily. (Patient taking differently: Take 120 mg by mouth daily. )   No facility-administered encounter medications on file as of 11/04/2016.     Allergies (verified) Codeine phosphate and Penicillins   History: Past Medical History:  Diagnosis Date  . Barrett's esophagus 2011   Recommend repeat EGD for surveillance in 2 yrs-[facility]  . [place]   . Chronic [place] disease   . [place]   . Diverticulosis of colon   . GANGLION CYST, TENDON SHEATH 06/26/2010  . Hyperlipidemia   . Hypertension   . Impotence   . Migraine   . Pancreatitis   . Pulmonary nodule   . Pulmonary nodule seen on imaging study 02/06/2011   Followup CT in December 2014 with 20 months of stability at 4 mm.  No further imaging recommended    Past Surgical History:  Procedure Laterality Date  . CARDIAC CATHETERIZATION  2005   Nonobstructive, <40%  . TONSILLECTOMY Bilateral 1961   Family History  Problem Relation Age of Onset  . Anemia Mother   . Hypertension Daughter   . Diabetes Daughter   . Migraines Daughter    Social History   Occupational History  .  Not on file.   Social History Main Topics  . Smoking status: Former Smoker    Packs/day: 0.25    Years: 53.00    Types: Cigarettes    Start date: 09/29/1962    Quit date: 08/04/2016  . Smokeless tobacco: Never Used     Comment: Previous max 6 cigs per day [place]  . Alcohol use No     Comment: Last drink 17 months ago.  . Drug use: Yes    Types: Marijuana     Comment: daily marijuana since teenager. Last 20 years ago.  [name] Sexual activity: Yes    Birth control/ protection: Condom     Comment: Multiple sex partners. 60 in last year.   Tobacco Counseling Patient states that he quit smoking cigarettes approx 3 months ago. Continues to smoke 1  cigar per day and marijuana most days.  Activities of Daily Living In your present state of health, do you have any difficulty performing the following activities: 11/04/2016  Hearing? N  Vision? N  Difficulty concentrating or making decisions? N  Walking or climbing stairs? N  Dressing or bathing? N  Doing errands, shopping? N  Preparing Food and eating ? N  Using the Toilet? N  In the past six months, have you accidently leaked urine? N  Do you have problems with loss of bowel control? N  Managing your Medications? N  Managing your Finances? N  Housekeeping or managing your Housekeeping? N  Some recent data might be hidden   Home Safety:  My home has a working smoke alarm:  Yes X 4           My home throw rugs have been fastened down to the floor or removed:  Removed I have non-slip mats in the bathtub and shower:  Yes         All my home's stairs have railings or bannisters: One level home with 3 front and 2 back steps without railings. Discussed having handrails installed to decrease fall risk           My home's floors, stairs and hallways are free from clutter, wires and cords:  Yes   I wear seatbelts consistently:  Yes    Immunizations and Health Maintenance Immunization History  Administered Date(s) Administered  . Influenza Split 08/24/2012  . Influenza Whole 09/02/2007, 06/23/2008, 06/09/2011  . Influenza,inj,Quad PF,36+ Mos 07/29/2013  . Influenza-Unspecified 08/24/2012, 08/29/2016  . Pneumococcal Conjugate-13 03/18/2016  . Tdap 06/09/2011   Health Maintenance Due  Topic Date Due  . Hepatitis C Screening  [birth year]  . ZOSTAVAX  07/21/2010  Will have Hep C screening at next OV with PCP [facility] Medicare) Discussed receiving Shingrix when available.  Patient Care Team: Janora Norlander, DO as PCP - General (Family Medicine) Lovena Neighbours, DMD (Dentistry)  Indicate any recent Medical Services you may have received from other than [facility] providers in the past year  (date may be approximate).    Assessment:   This is a routine wellness examination for [place].   Hearing/Vision screen  Hearing Screening   Method: Audiometry   '125Hz'  '250Hz'  '500Hz'  '1000Hz'  '2000Hz'  '3000Hz'  '4000Hz'  '6000Hz'  '8000Hz'   Right ear:   40 40 40  Fail    Left ear:   40 40 Fail  Fail      Dietary issues and exercise activities discussed: Current Exercise Habits: Home exercise routine, Type of exercise: walking;strength training/weights, Time (Minutes): 40, Frequency (Times/Week): 3, Weekly Exercise (Minutes/Week): 120, Intensity: Moderate, Exercise limited by:  orthopedic condition(s) (Right hip pain)  Goals    . Blood Pressure < 140/90    . Eat more fruits and vegetables    . Exercise 150 minutes per week (moderate activity)      Depression Screen PHQ 2/9 Scores 11/04/2016 10/13/2016 09/09/2016 06/18/2016  PHQ - 2 Score 3 0 0 0  PHQ- 9 Score 12 - - -  Patient met with Behavioral Heal Consultant, Sonia Baller, directly after our appt. PCP notified.  Fall Risk Fall Risk  11/04/2016 10/24/2016 10/13/2016 09/09/2016 06/18/2016  Falls in the past year? No No No Yes No  Injury with Fall? - - - Yes -   TUG Test:  Done in 10 seconds. Patient used both hands to push out of chair and one hand to sit back down.  Cognitive Function: Mini-Cog  Failed with score 2/5  Screening Tests Health Maintenance  Topic Date Due  . Hepatitis C Screening  Jun 17, 1951  . ZOSTAVAX  07/21/2010  . PNA vac Low Risk Adult (2 of 2 - PPSV23) 03/18/2017  . COLONOSCOPY  02/06/2020  . TETANUS/TDAP  06/08/2021  . INFLUENZA VACCINE  Completed       Plan:   Patient advised to sched appt with PCP to discuss elevated BPs as above. Patient made appt for 11/11/2016.  During the course of the visit Wayde was educated and counseled about the following appropriate screening and preventive services:   Vaccines to include Pneumoccal, Influenza, Td, Zostavax/Shingrix  Colorectal cancer screening  Cardiovascular disease  screening  Diabetes screening  Nutrition counseling  Smoking cessation counseling  Patient given list of eye doctors who take [facility] for eye exam.  Patient Instructions (the written plan) were given to the patient.   Velora Heckler, RN   11/04/2016

## 2016-11-04 NOTE — Progress Notes (Signed)
Jaime Barnes requested a [employer].   Presenting Issue:  PHQ-9 elevated during Medicare Wellness Visit (total=12)  Report of symptoms:  Patient and his male friend who he is currently staying with were present in the visit. His friend who was present during this visit is a woman who the patient has an adult daughter with. He is also currently living with this friend. During our visit, patient described himself as very happy in his life. He mentioned that he used to go to [place] until about 1.5 years ago when he stopped, and he doesn't feel he needs to go back at this time. Patient reports that he was diagnosed with bipolar disorder at [facility], but that he disagrees with this diagnosis because he's "not crazy." He denied having periods of several days in a row when he feels very down, or several days in a row when he feels very energetic or talkative, however his friend also commented on this, stating concern that the patient still has times when he feels upset about losing his mother, who passed away 5 years ago. I asked about his sleep and he reported sleeping fine most nights. His friend chimed in and explained that he goes to bed around midnight and wakes up at 4am. The friend feels that she often has to try to force him to slow down and get rest, and is concerned that his constantly being on the go might be contributing to his high blood pressure.    Psychiatric History - Diagnoses: Bipolar disorder, diagnosed by [facility], per patient - Hospitalizations: Patient's friend reported that he had a "breakdown" about 5 years ago after his mother's death and was kept at [facility] for 8 days. Patient could not describe details of what prompted this hospital stay and denied past or present SI/HI. He said that "it's history"  - Pharmacotherapy: Previously prescribed medication at [facility], but does not appear to be on anything for mood currently - Outpatient therapy: Had  therapy/Medication management through [facility]   PHQ-9:  12  Assessment / Plan / Recommendations: Patient did not seem to have any concerns today about his functioning or wellbeing. However, his friend who accompanied him expressed that she is worried about him, particularly his sleep, being on the go, and his blood pressure. She asked about how he could come back to see a [facility] and I showed her where she can find the phone number on the pink sheet that Lauren gave him. Patient himself does not feel that he needs to schedule a followup appointment at this time, but said that if he needs anything in the future, he will be sure to call the office.   Per patient's report, he has a significant mental health history but is not on any medications for mood at present and is not involved in therapy. Given his history, his provider may want to check in with him periodically on his symptoms related to depression and mania and watch for any changes in functioning.

## 2016-11-04 NOTE — Patient Instructions (Signed)
Fat and Cholesterol Restricted Diet High levels of fat and cholesterol in your blood may lead to various health problems, such as diseases of the heart, blood vessels, gallbladder, liver, and pancreas. Fats are concentrated sources of energy that come in various forms. Certain types of fat, including saturated fat, may be harmful in excess. Cholesterol is a substance needed by your body in small amounts. Your body makes all the cholesterol it needs. Excess cholesterol comes from the food you eat. When you have high levels of cholesterol and saturated fat in your blood, health problems can develop because the excess fat and cholesterol will gather along the walls of your blood vessels, causing them to narrow. Choosing the right foods will help you control your intake of fat and cholesterol. This will help keep the levels of these substances in your blood within normal limits and reduce your risk of disease. What is my plan? Your health care provider recommends that you:  Limit your fat intake to ______% or less of your total calories per day.  Limit the amount of cholesterol in your diet to less than _________mg per day.  Eat 20-30 grams of fiber each day. What types of fat should I choose?  Choose healthy fats more often. Choose monounsaturated and polyunsaturated fats, such as olive and canola oil, flaxseeds, walnuts, almonds, and seeds.  Eat more omega-3 fats. Good choices include salmon, mackerel, sardines, tuna, flaxseed oil, and ground flaxseeds. Aim to eat fish at least two times a week.  Limit saturated fats. Saturated fats are primarily found in animal products, such as meats, butter, and cream. Plant sources of saturated fats include palm oil, palm kernel oil, and coconut oil.  Avoid foods with partially hydrogenated oils in them. These contain trans fats. Examples of foods that contain trans fats are stick margarine, some tub margarines, cookies, crackers, and other baked goods. What  general guidelines do I need to follow? These guidelines for healthy eating will help you control your intake of fat and cholesterol:  Check food labels carefully to identify foods with trans fats or high amounts of saturated fat.  Fill one half of your plate with vegetables and green salads.  Fill one fourth of your plate with whole grains. Look for the word "whole" as the first word in the ingredient list.  Fill one fourth of your plate with lean protein foods.  Limit fruit to two servings a day. Choose fruit instead of juice.  Eat more foods that contain fiber, such as apples, broccoli, carrots, beans, peas, and barley.  Eat more home-cooked food and less restaurant, buffet, and fast food.  Limit or avoid alcohol.  Limit foods high in starch and sugar.  Limit fried foods.  Cook foods using methods other than frying. Baking, boiling, grilling, and broiling are all great options.  Lose weight if you are overweight. Losing just 5-10% of your initial body weight can help your overall health and prevent diseases such as diabetes and heart disease. What foods can I eat? Grains  Whole grains, such as whole wheat or whole grain breads, crackers, cereals, and pasta. Unsweetened oatmeal, bulgur, barley, quinoa, or brown rice. Corn or whole wheat flour tortillas. Vegetables  Fresh or frozen vegetables (raw, steamed, roasted, or grilled). Green salads. Fruits  All fresh, canned (in natural juice), or frozen fruits. Meats and other protein foods  Ground beef (85% or leaner), grass-fed beef, or beef trimmed of fat. Skinless chicken or [place]. Ground chicken or [place]. Pork trimmed  of fat. All fish and seafood. Eggs. Dried beans, peas, or lentils. Unsalted nuts or seeds. Unsalted canned or dry beans. Dairy  Low-fat dairy products, such as skim or 1% milk, 2% or reduced-fat cheeses, low-fat ricotta or cottage cheese, or plain low-fat yo Fats and oils  Tub margarines without trans fats.  Light or reduced-fat mayonnaise and salad dressings. Avocado. Olive, canola, sesame, or safflower oils. Natural peanut or almond butter (choose ones without added sugar and oil). The items listed above may not be a complete list of recommended foods or beverages. Contact your dietitian for more options.  Foods to avoid Grains  White bread. White pasta. White rice. Cornbread. Bagels, pastries, and croissants. Crackers that contain trans fat. Vegetables  White potatoes. Corn. Creamed or fried vegetables. Vegetables in a cheese sauce. Fruits  Dried fruits. Canned fruit in light or heavy syrup. Fruit juice. Meats and other protein foods  Fatty cuts of meat. Ribs, chicken wings, bacon, sausage, bologna, salami, chitterlings, fatback, hot dogs, bratwurst, and packaged luncheon meats. Liver and organ meats. Dairy  Whole or 2% milk, cream, half-and-half, and cream cheese. Whole milk cheeses. Whole-fat or sweetened yogurt. Full-fat cheeses. Nondairy creamers and whipped toppings. Processed cheese, cheese spreads, or cheese curds. Beverages  Alcohol. Sweetened drinks (such as sodas, lemonade, and fruit drinks or punches). Fats and oils  Butter, stick margarine, lard, shortening, ghee, or bacon fat. Coconut, palm kernel, or palm oils. Sweets and desserts  Corn syrup, sugars, honey, and molasses. Candy. Jam and jelly. Syrup. Sweetened cereals. Cookies, pies, cakes, donuts, muffins, and ice cream. The items listed above may not be a complete list of foods and beverages to avoid. Contact your dietitian for more information.  This information is not intended to replace advice given to you by your health care provider. Make sure you discuss any questions you have with your health care provider. Document Released: 09/15/2005 Document Revised: 10/06/2014 Document Reviewed: 12/14/2013 Elsevier Interactive Patient Education  2017 [place] Prevention in the Home Introduction Falls can  cause injuries. They can happen to people of all ages. There are many things you can do to make your home safe and to help prevent falls. What can I do on the outside of my home?  Regularly fix the edges of walkways and driveways and fix any cracks.  Remove anything that might make you trip as you walk through a door, such as a raised step or threshold.  Trim any bushes or trees on the path to your home.  Use bright outdoor lighting.  Clear any walking paths of anything that might make someone trip, such as rocks or tools.  Regularly check to see if handrails are loose or broken. Make sure that both sides of any steps have handrails.  Any raised decks and porches should have guardrails on the edges.  Have any leaves, snow, or ice cleared regularly.  Use sand or salt on walking paths during winter.  Clean up any spills in your garage right away. This includes oil or grease spills. What can I do in the bathroom?  Use night lights.  Install grab bars by the toilet and in the tub and shower. Do not use towel bars as grab bars.  Use non-skid mats or decals in the tub or shower.  If you need to sit down in the shower, use a plastic, non-slip stool.  Keep the floor dry. Clean up any water that spills on the floor as soon as it  happens.  Remove soap buildup in the tub or shower regularly.  Attach bath mats securely with double-sided non-slip rug tape.  Do not have throw rugs and other things on the floor that can make you trip. What can I do in the bedroom?  Use night lights.  Make sure that you have a light by your bed that is easy to reach.  Do not use any sheets or blankets that are too big for your bed. They should not hang down onto the floor.  Have a firm chair that has side arms. You can use this for support while you get dressed.  Do not have throw rugs and other things on the floor that can make you trip. What can I do in the kitchen?  Clean up any spills right  away.  Avoid walking on wet floors.  Keep items that you use a lot in easy-to-reach places.  If you need to reach something above you, use a strong step stool that has a grab bar.  Keep electrical cords out of the way.  Do not use floor polish or wax that makes floors slippery. If you must use wax, use non-skid floor wax.  Do not have throw rugs and other things on the floor that can make you trip. What can I do with my stairs?  Do not leave any items on the stairs.  Make sure that there are handrails on both sides of the stairs and use them. Fix handrails that are broken or loose. Make sure that handrails are as long as the stairways.  Check any carpeting to make sure that it is firmly attached to the stairs. Fix any carpet that is loose or worn.  Avoid having throw rugs at the top or bottom of the stairs. If you do have throw rugs, attach them to the floor with carpet tape.  Make sure that you have a light switch at the top of the stairs and the bottom of the stairs. If you do not have them, ask someone to add them for you. What else can I do to help prevent falls?  Wear shoes that:  Do not have high heels.  Have rubber bottoms.  Are comfortable and fit you well.  Are closed at the toe. Do not wear sandals.  If you use a stepladder:  Make sure that it is fully opened. Do not climb a closed stepladder.  Make sure that both sides of the stepladder are locked into place.  Ask someone to hold it for you, if possible.  Clearly mark and make sure that you can see:  Any grab bars or handrails.  First and last steps.  Where the edge of each step is.  Use tools that help you move around (mobility aids) if they are needed. These include:  Canes.  Walkers.  Scooters.  Crutches.  Turn on the lights when you go into a dark area. Replace any light bulbs as soon as they burn out.  Set up your furniture so you have a clear path. Avoid moving your furniture  around.  If any of your floors are uneven, fix them.  If there are any pets around you, be aware of where they are.  Review your medicines with your doctor. Some medicines can make you feel dizzy. This can increase your chance of falling. Ask your doctor what other things that you can do to help prevent falls. This information is not intended to replace advice given to you by  your health care provider. Make sure you discuss any questions you have with your health care provider. Document Released: 07/12/2009 Document Revised: 02/21/2016 Document Reviewed: 10/20/2014  2017 Elsevier  Health Maintenance, Male A healthy lifestyle and preventative care can promote health and wellness.  Maintain regular health, dental, and eye exams.  Eat a healthy diet. Foods like vegetables, fruits, whole grains, low-fat dairy products, and lean protein foods contain the nutrients you need and are low in calories. Decrease your intake of foods high in solid fats, added sugars, and salt. Get information about a proper diet from your health care provider, if necessary.  Regular physical exercise is one of the most important things you can do for your health. Most adults should get at least 150 minutes of moderate-intensity exercise (any activity that increases your heart rate and causes you to sweat) each week. In addition, most adults need muscle-strengthening exercises on 2 or more days a week.   Maintain a healthy weight. The body mass index (BMI) is a screening tool to identify possible weight problems. It provides an estimate of body fat based on height and weight. Your health care provider can find your BMI and can help you achieve or maintain a healthy weight. For males 20 years and older:  A BMI below 18.5 is considered underweight.  A BMI of 18.5 to 24.9 is normal.  A BMI of 25 to 29.9 is considered overweight.  A BMI of 30 and above is considered obese.  Maintain normal blood lipids and  cholesterol by exercising and minimizing your intake of saturated fat. Eat a balanced diet with plenty of fruits and vegetables. Blood tests for lipids and cholesterol should begin at age 80 and be repeated every 5 years. If your lipid or cholesterol levels are high, you are over age 45, or you are at high risk for heart disease, you may need your cholesterol levels checked more frequently.Ongoing high lipid and cholesterol levels should be treated with medicines if diet and exercise are not working.  If you smoke, find out from your health care provider how to quit. If you do not use tobacco, do not start.  Lung cancer screening is recommended for adults aged 52-80 years who are at high risk for developing lung cancer because of a history of smoking. A yearly low-dose CT scan of the lungs is recommended for people who have at least a 30-pack-year history of smoking and are current smokers or have quit within the past 15 years. A pack year of smoking is smoking an average of 1 pack of cigarettes a day for 1 year (for example, a 30-pack-year history of smoking could mean smoking 1 pack a day for 30 years or 2 packs a day for 15 years). Yearly screening should continue until the smoker has stopped smoking for at least 15 years. Yearly screening should be stopped for people who develop a health problem that would prevent them from having lung cancer treatment.  If you choose to drink alcohol, do not have more than 2 drinks per day. One drink is considered to be 12 oz (360 mL) of beer, 5 oz (150 mL) of wine, or 1.5 oz (45 mL) of liquor.  Avoid the use of street drugs. Do not share needles with anyone. Ask for help if you need support or instructions about stopping the use of drugs.  High blood pressure causes heart disease and increases the risk of stroke. High blood pressure is more likely to develop in:  People who have blood pressure in the end of the normal range (100-139/85-89 mm Hg).  People who are  overweight or obese.  People who are African American.  If you are 80-19 years of age, have your blood pressure checked every 3-5 years. If you are 37 years of age or older, have your blood pressure checked every year. You should have your blood pressure measured twice-once when you are at a hospital or clinic, and once when you are not at a hospital or clinic. Record the average of the two measurements. To check your blood pressure when you are not at a hospital or clinic, you can use:  An automated blood pressure machine at a pharmacy.  A home blood pressure monitor.  If you are 54-41 years old, ask your health care provider if you should take aspirin to prevent heart disease.  Diabetes screening involves taking a blood sample to check your fasting blood sugar level. This should be done once every 3 years after age 86 if you are at a normal weight and without risk factors for diabetes. Testing should be considered at a younger age or be carried out more frequently if you are overweight and have at least 1 risk factor for diabetes.  Colorectal cancer can be detected and often prevented. Most routine colorectal cancer screening begins at the age of 35 and continues through age 81. However, your health care provider may recommend screening at an earlier age if you have risk factors for colon cancer. On a yearly basis, your health care provider may provide home test kits to check for hidden blood in the stool. A small camera at the end of a tube may be used to directly examine the colon (sigmoidoscopy or colonoscopy) to detect the earliest forms of colorectal cancer. Talk to your health care provider about this at age 17 when routine screening begins. A direct exam of the colon should be repeated every 5-10 years through age 7, unless early forms of precancerous polyps or small growths are found.  People who are at an increased risk for hepatitis B should be screened for this virus. You are considered  at high risk for hepatitis B if:  You were born in a country where hepatitis B occurs often. Talk with your health care provider about which countries are considered high risk.  Your parents were born in a high-risk country and you have not received a shot to protect against hepatitis B (hepatitis B vaccine).  You have HIV or AIDS.  You use needles to inject street drugs.  You live with, or have sex with, someone who has hepatitis B.  You are a man who has sex with other men (MSM).  You get hemodialysis treatment.  You take certain medicines for conditions like cancer, organ transplantation, and autoimmune conditions.  Hepatitis C blood testing is recommended for all people born from [birth year] through 1966 and any individual with known risk factors for hepatitis C.  Healthy men should no longer receive prostate-specific antigen (PSA) blood tests as part of routine cancer screening. Talk to your health care provider about prostate cancer screening.  Testicular cancer screening is not recommended for adolescents or adult males who have no symptoms. Screening includes self-exam, a health care provider exam, and other screening tests. Consult with your health care provider about any symptoms you have or any concerns you have about testicular cancer.  Practice safe sex. Use condoms and avoid high-risk sexual practices to reduce the spread of  sexually transmitted infections (STIs).  You should be screened for STIs, including gonorrhea and chlamydia if:  You are sexually active and are younger than 24 years.  You are older than 24 years, and your health care provider tells you that you are at risk for this type of infection.  Your sexual activity has changed since you were last screened, and you are at an increased risk for chlamydia or gonorrhea. Ask your health care provider if you are at risk.  If you are at risk of being infected with HIV, it is recommended that you take a prescription  medicine daily to prevent HIV infection. This is called pre-exposure prophylaxis (PrEP). You are considered at risk if:  You are a man who has sex with other men (MSM).  You are a heterosexual man who is sexually active with multiple partners.  You take drugs by injection.  You are sexually active with a partner who has HIV.  Talk with your health care provider about whether you are at high risk of being infected with HIV. If you choose to begin PrEP, you should first be tested for HIV. You should then be tested every 3 months for as long as you are taking PrEP.  Use sunscreen. Apply sunscreen liberally and repeatedly throughout the day. You should seek shade when your shadow is shorter than you. Protect yourself by wearing long sleeves, pants, a wide-brimmed hat, and sunglasses year round whenever you are outdoors.  Tell your health care provider of new moles or changes in moles, especially if there is a change in shape or color. Also, tell your health care provider if a mole is larger than the size of a pencil eraser.  A one-time screening for abdominal aortic aneurysm (AAA) and surgical repair of large AAAs by ultrasound is recommended for men aged 30-75 years who are current or former smokers.  Stay current with your vaccines (immunizations). This information is not intended to replace advice given to you by your health care provider. Make sure you discuss any questions you have with your health care provider. Document Released: 03/13/2008 Document Revised: 10/06/2014 Document Reviewed: 06/19/2015 Elsevier Interactive Patient Education  2017 [place].   Hearing Loss Introduction Hearing loss is a partial or total loss of the ability to hear. This can be temporary or permanent, and it can happen in one or both ears. Hearing loss may be referred to as deafness. Medical care is necessary to treat hearing loss properly and to prevent the condition from getting worse. Your hearing may  partially or completely come back, depending on what caused your hearing loss and how severe it is. In some cases, hearing loss is permanent. What are the causes? Common causes of hearing loss include:  Too much wax in the ear canal.  Infection of the ear canal or middle ear.  Fluid in the middle ear.  Injury to the ear or surrounding area.  An object stuck in the ear.  Prolonged exposure to loud sounds, such as music. Less common causes of hearing loss include:  Tumors in the ear.  Viral or bacterial infections, such as meningitis.  A hole in the eardrum (perforated eardrum).  Problems with the hearing nerve that sends signals between the brain and the ear.  Certain medicines. What are the signs or symptoms? Symptoms of this condition may include:  Difficulty telling the difference between sounds.  Difficulty following a conversation when there is background noise.  Lack of response to sounds in  your environment. This may be most noticeable when you do not respond to startling sounds.  Needing to turn up the volume on the television, radio, etc.  Ringing in the ears.  Dizziness.  Pain in the ears. How is this diagnosed? This condition is diagnosed based on a physical exam and a hearing test (audiometry). The audiometry test will be performed by a hearing specialist (audiologist). You may also be referred to an ear, nose, and throat (ENT) specialist (otolaryngologist). How is this treated? Treatment for recent onset of hearing loss may include:  Ear wax removal.  Being prescribed medicines to prevent infection (antibiotics).  Being prescribed medicines to reduce inflammation (corticosteroids). Follow these instructions at home:  If you were prescribed an antibiotic medicine, take it as told by your health care provider. Do not stop taking the antibiotic even if you start to feel better.  Take over-the-counter and prescription medicines only as told by your  health care provider.  Avoid loud noises.  Return to your normal activities as told by your health care provider. Ask your health care provider what activities are safe for you.  Keep all follow-up visits as told by your health care provider. This is important. Contact a health care provider if:  You feel dizzy.  You develop new symptoms.  You vomit or feel nauseous.  You have a fever. Get help right away if:  You develop sudden changes in your vision.  You have severe ear pain.  You have new or increased weakness.  You have a severe headache. This information is not intended to replace advice given to you by your health care provider. Make sure you discuss any questions you have with your health care provider. Document Released: 09/15/2005 Document Revised: 02/21/2016 Document Reviewed: 01/31/2015  2017 Elsevier

## 2016-11-04 NOTE — Progress Notes (Signed)
I have reviewed this visit and discussed with Lauren Ducatte, RN, BSN, and agree with her documentation.   Sherrill Mckamie M. Yenty Bloch, DO PGY-3, [facility]     

## 2016-11-11 ENCOUNTER — Encounter: Payer: Self-pay | Admitting: Family Medicine

## 2016-11-11 ENCOUNTER — Ambulatory Visit (INDEPENDENT_AMBULATORY_CARE_PROVIDER_SITE_OTHER): Payer: Medicare Other | Admitting: Family Medicine

## 2016-11-11 VITALS — BP 172/92 | HR 66 | Temp 98.3°F | Ht 66.0 in | Wt 176.0 lb

## 2016-11-11 DIAGNOSIS — G8929 Other chronic pain: Secondary | ICD-10-CM

## 2016-11-11 DIAGNOSIS — J45990 Exercise induced bronchospasm: Secondary | ICD-10-CM | POA: Diagnosis not present

## 2016-11-11 DIAGNOSIS — M5441 Lumbago with sciatica, right side: Secondary | ICD-10-CM

## 2016-11-11 DIAGNOSIS — F17200 Nicotine dependence, unspecified, uncomplicated: Secondary | ICD-10-CM | POA: Diagnosis not present

## 2016-11-11 DIAGNOSIS — I10 Essential (primary) hypertension: Secondary | ICD-10-CM | POA: Diagnosis not present

## 2016-11-11 MED ORDER — PREDNISONE 10 MG PO TABS: ORAL_TABLET | ORAL | 0 refills | Status: DC

## 2016-11-11 MED ORDER — ALBUTEROL SULFATE HFA 108 (90 BASE) MCG/ACT IN AERS: 1.0000 | INHALATION_SPRAY | Freq: Four times a day (QID) | RESPIRATORY_TRACT | 1 refills | Status: DC | PRN

## 2016-11-11 NOTE — Assessment & Plan Note (Signed)
Has been ongoing since mechanical fall in November.  He is on Zanaflex rx'd by another prescriber.  He does not feel significant improvement w/ this.  Prednisone taper prescribed.  Ortho referral placed.  Will defer imaging studies to that appt.  Red flag signs reviewed w/ patient.

## 2016-11-11 NOTE — Progress Notes (Signed)
    Subjective: CC: HTN HPI: Jaime Barnes is a 66 y.o. male presenting to clinic today for:  1. Hypertension BP at home ranging from 170-190s/80-100s Meds: Has been taking Lisinopril daily.  Has been taking Verapamil ONCE daily.  Continues to smoke cigars intermittently.  Has increased physical activity but not much diet modification.  ROS: Denies headache, dizziness, visual changes, nausea, vomiting, chest pain, abdominal pain or shortness of breath outside of normal.  2. SOB Patient has SOB with vigorous activity.  He notes that albuterol prior to exercise helps.  He denies SOB with normal activity.  Reports continued use of cigars.  He notes he has been increasing physical activity.  3. Back pain Patient reports right LE and right sided back pain that is intermittent in nature.  He notes injury at the end of November.  He notes that flares occur at random.  He reports pain is improved with hot showers.  He can go several days sometimes without pain.  Pain is sharp in nature.  Does not radiate to groin.  No saddle anesthesia, weakness, urinary retention, fecal incontinence.  Social Hx reviewed: active cigar smoker. MedHx, medications and allergies reviewed.  Please see EMR. ROS: Per HPI  Objective: Office vital signs reviewed. BP (!) 172/92   Pulse 66   Temp 98.3 F (36.8 C) (Oral)   Ht 5\' 6"  (1.676 m)   Wt 176 lb (79.8 kg)   SpO2 98%   BMI 28.41 kg/m   Physical Examination:  General: Awake, alert, well nourished, No acute distress Cardio: regular rate and rhythm, S1S2 heard, no murmurs appreciated Pulm: clear to auscultation bilaterally, no wheezes, rhonchi or rales; normal work of breathing on room air Extremities: warm, well perfused, No edema, cyanosis or clubbing; +2 pulses bilaterally MSK: normal gait and normal station; full AROM, mild TTP to lumbosacral paraspinal muscles R>L.  Negative seated straight leg raise Neuro: 5/5 LE Strength and light touch sensation grossly  intact, patellar DTRs 2/4 bilaterally  Assessment/ Plan: 66 y.o. male   HYPERTENSION, BENIGN SYSTEMIC Uncontrolled.  Patient is not taking Verapamil as prescribed.  We reviewed TID dosing and teach back method performed.  Continue Lisinopril.  Patient to maintain BP log and follow up with me next week.  If persistently elevated despite proper use of meds will plan to increase Lisinopril to 20mg  and check BMP.  I might also consider starting a low dose Metoprolol, given history of CAD but no MI history.  Smoking cessation advised.  Return precautions reviewed.  No red flags on today's exam.  Back pain Has been ongoing since mechanical fall in November.  He is on Zanaflex rx'd by another prescriber.  He does not feel significant improvement w/ this.  Prednisone taper prescribed.  Ortho referral placed.  Will defer imaging studies to that appt.  Red flag signs reviewed w/ patient.  TOBACCO USER Active cigar smoker.  He does not seem to correlate cigar smoking with tobacco use.  Counseling performed.  Precontemplative.  Exercise-induced bronchospasm Had PFTs performed here at [facility] w/ Dr Valentina Lucks in 09/2016.  PFTs revealed normal lung function.  He still has SOB with vigorous/ outside activity.  Albuterol refilled to be used prior to activity.  Follow up in 1 week.  Janora Norlander, DO PGY-3, [facility]

## 2016-11-11 NOTE — Assessment & Plan Note (Signed)
Active cigar smoker.  He does not seem to correlate cigar smoking with tobacco use.  Counseling performed.  Precontemplative.

## 2016-11-11 NOTE — Patient Instructions (Signed)
Plan to see me again next week for blood pressure check.  Remember to take your Verapamil three times daily as directed.  Keep a blood pressure log.

## 2016-11-11 NOTE — Assessment & Plan Note (Addendum)
Had PFTs performed here at [facility] w/ Dr Valentina Lucks in 09/2016.  PFTs revealed normal lung function.  He still has SOB with vigorous/ outside activity.  Albuterol refilled to be used prior to activity.

## 2016-11-11 NOTE — Assessment & Plan Note (Addendum)
Uncontrolled.  Patient is not taking Verapamil as prescribed.  We reviewed TID dosing and teach back method performed.  Continue Lisinopril.  Patient to maintain BP log and follow up with me next week.  If persistently elevated despite proper use of meds will plan to increase Lisinopril to 20mg  and check BMP.  I might also consider starting a low dose Metoprolol, given history of CAD but no MI history.  Smoking cessation advised.  Return precautions reviewed.  No red flags on today's exam.

## 2016-11-14 ENCOUNTER — Ambulatory Visit: Payer: Medicare Other | Admitting: Podiatry

## 2016-11-18 ENCOUNTER — Encounter: Payer: Self-pay | Admitting: Podiatry

## 2016-11-18 ENCOUNTER — Ambulatory Visit (INDEPENDENT_AMBULATORY_CARE_PROVIDER_SITE_OTHER): Payer: Medicare Other | Admitting: Podiatry

## 2016-11-18 DIAGNOSIS — M79676 Pain in unspecified toe(s): Secondary | ICD-10-CM | POA: Diagnosis not present

## 2016-11-18 DIAGNOSIS — B351 Tinea unguium: Secondary | ICD-10-CM

## 2016-11-18 NOTE — Progress Notes (Signed)
Patient ID: Jaime Barnes, male   DOB: 03/06/1951, 65 y.o.   MRN: [id number] Complaint:  Visit Type: Patient returns to my office for continued preventative foot care services. Complaint: Patient states" my nails have grown long and thick and become painful to walk and wear shoes" . The patient presents for preventative foot care services. No changes to ROS  Podiatric Exam: Vascular: dorsalis pedis and posterior tibial pulses are palpable bilateral. Capillary return is immediate. Temperature gradient is WNL. Skin turgor WNL  Sensorium: Normal Semmes Weinstein monofilament test. Normal tactile sensation bilaterally. Nail Exam: Pt has thick disfigured discolored nails with subungual debris noted bilateral entire nail hallux through fifth toenails Ulcer Exam: There is no evidence of ulcer or pre-ulcerative changes or infection. Orthopedic Exam: Muscle tone and strength are WNL. No limitations in general ROM. No crepitus or effusions noted. Foot type and digits show no abnormalities. Bony prominences are unremarkable. Skin: No Porokeratosis. No infection or ulcers  Diagnosis:  Onychomycosis, , Pain in right toe, pain in left toes  Treatment & Plan Procedures and Treatment: Consent by patient was obtained for treatment procedures. The patient understood the discussion of treatment and procedures well. All questions were answered thoroughly reviewed. Debridement of mycotic and hypertrophic toenails, 1 through 5 bilateral and clearing of subungual debris. No ulceration, no infection noted.   Return Visit-Office Procedure: Patient instructed to return to the office for a follow up visit 3 months for continued evaluation and treatment.    Jencarlo Bonadonna DPM 

## 2016-11-19 ENCOUNTER — Ambulatory Visit: Payer: Medicare Other | Admitting: Family Medicine

## 2016-11-21 ENCOUNTER — Ambulatory Visit: Payer: Medicare Other | Admitting: Family Medicine

## 2016-11-28 ENCOUNTER — Encounter: Payer: Self-pay | Admitting: Family Medicine

## 2016-11-28 ENCOUNTER — Ambulatory Visit (INDEPENDENT_AMBULATORY_CARE_PROVIDER_SITE_OTHER): Payer: Medicare Other | Admitting: Family Medicine

## 2016-11-28 VITALS — BP 172/98 | HR 80 | Temp 98.4°F | Ht 66.0 in | Wt 176.6 lb

## 2016-11-28 DIAGNOSIS — Z1159 Encounter for screening for other viral diseases: Secondary | ICD-10-CM | POA: Diagnosis not present

## 2016-11-28 DIAGNOSIS — I10 Essential (primary) hypertension: Secondary | ICD-10-CM | POA: Diagnosis not present

## 2016-11-28 DIAGNOSIS — M5441 Lumbago with sciatica, right side: Secondary | ICD-10-CM

## 2016-11-28 DIAGNOSIS — G8929 Other chronic pain: Secondary | ICD-10-CM | POA: Diagnosis not present

## 2016-11-28 DIAGNOSIS — G4700 Insomnia, unspecified: Secondary | ICD-10-CM

## 2016-11-28 MED ORDER — LISINOPRIL 20 MG PO TABS: 20.0000 mg | ORAL_TABLET | Freq: Every day | ORAL | 0 refills | Status: DC

## 2016-11-28 NOTE — Assessment & Plan Note (Signed)
Persistently elevated despite Verapamil and Lisinopril compliance.  Patient still smoking cigars intermittently.  He has been modifying other areas of his lifestyle w/ regular exercise.  I have increased his Lisinopril from 10mg  to 20mg .  Will check BMP and repeat BP next week w/ RN.  If persistently >140/90, patient to follow up with me in clinic.  Will consider referral for sleep study at that time.  Return precautions discussed.

## 2016-11-28 NOTE — Patient Instructions (Addendum)
Schedule a nurses appointment to be seen next week for blood pressure check and blood labs.  If your blood pressure is not less than 140/90, I want you to schedule an appointment with me as soon as possible.  I recommend that you start taking 3mg  of Melatonin at night time.  Take this each evening.  In some people it may be several weeks before they see a change.  In others, they notice improvement in just a few days.  Avoid caffeine, fluids before bed.  Keep electronics turned off when you get into bed.  Insomnia Insomnia is a sleep disorder that makes it difficult to fall asleep or to stay asleep. Insomnia can cause tiredness (fatigue), low energy, difficulty concentrating, mood swings, and poor performance at work or school. There are three different ways to classify insomnia:  Difficulty falling asleep.  Difficulty staying asleep.  Waking up too early in the morning. Any type of insomnia can be long-term (chronic) or short-term (acute). Both are common. Short-term insomnia usually lasts for three months or less. Chronic insomnia occurs at least three times a week for longer than three months. What are the causes? Insomnia may be caused by another condition, situation, or substance, such as:  Anxiety.  Certain medicines.  Gastroesophageal reflux disease (GERD) or other gastrointestinal conditions.  Asthma or other breathing conditions.  Restless legs syndrome, sleep apnea, or other sleep disorders.  Chronic pain.  Menopause. This may include hot flashes.  Stroke.  Abuse of alcohol, tobacco, or illegal drugs.  Depression.  Caffeine.  Neurological disorders, such as Alzheimer disease.  An overactive thyroid (hyperthyroidism). The cause of insomnia may not be known. What increases the risk? Risk factors for insomnia include:  Gender. Women are more commonly affected than men.  Age. Insomnia is more common as you get older.  Stress. This may involve your professional  or personal life.  Income. Insomnia is more common in people with lower income.  Lack of exercise.  Irregular work schedule or night shifts.  Traveling between different time zones. What are the signs or symptoms? If you have insomnia, trouble falling asleep or trouble staying asleep is the main symptom. This may lead to other symptoms, such as:  Feeling fatigued.  Feeling nervous about going to sleep.  Not feeling rested in the morning.  Having trouble concentrating.  Feeling irritable, anxious, or depressed. How is this treated? Treatment for insomnia depends on the cause. If your insomnia is caused by an underlying condition, treatment will focus on addressing the condition. Treatment may also include:  Medicines to help you sleep.  Counseling or therapy.  Lifestyle adjustments. Follow these instructions at home:  Take medicines only as directed by your health care provider.  Keep regular sleeping and waking hours. Avoid naps.  Keep a sleep diary to help you and your health care provider figure out what could be causing your insomnia. Include:  When you sleep.  When you wake up during the night.  How well you sleep.  How rested you feel the next day.  Any side effects of medicines you are taking.  What you eat and drink.  Make your bedroom a comfortable place where it is easy to fall asleep:  Put up shades or special blackout curtains to block light from outside.  Use a white noise machine to block noise.  Keep the temperature cool.  Exercise regularly as directed by your health care provider. Avoid exercising right before bedtime.  Use relaxation techniques to  manage stress. Ask your health care provider to suggest some techniques that may work well for you. These may include:  Breathing exercises.  Routines to release muscle tension.  Visualizing peaceful scenes.  Cut back on alcohol, caffeinated beverages, and cigarettes, especially close to  bedtime. These can disrupt your sleep.  Do not overeat or eat spicy foods right before bedtime. This can lead to digestive discomfort that can make it hard for you to sleep.  Limit screen use before bedtime. This includes:  Watching TV.  Using your smartphone, tablet, and computer.  Stick to a routine. This can help you fall asleep faster. Try to do a quiet activity, brush your teeth, and go to bed at the same time each night.  Get out of bed if you are still awake after 15 minutes of trying to sleep. Keep the lights down, but try reading or doing a quiet activity. When you feel sleepy, go back to bed.  Make sure that you drive carefully. Avoid driving if you feel very sleepy.  Keep all follow-up appointments as directed by your health care provider. This is important. Contact a health care provider if:  You are tired throughout the day or have trouble in your daily routine due to sleepiness.  You continue to have sleep problems or your sleep problems get worse. Get help right away if:  You have serious thoughts about hurting yourself or someone else. This information is not intended to replace advice given to you by your health care provider. Make sure you discuss any questions you have with your health care provider. Document Released: 09/12/2000 Document Revised: 02/15/2016 Document Reviewed: 06/16/2014 Elsevier Interactive Patient Education  2017 [employer].

## 2016-11-28 NOTE — Progress Notes (Signed)
    Subjective: CC: HTN HPI: Jaime Barnes is a 66 y.o. male presenting to clinic today for:  1. Hypertension Patient seen 11/11/16 for HTN.  At that time, his BP was elevated.  He was not taking his Verapamil TID as prescribed at that time.  Since last appointment, patient reports that he has been compliant with Verapamil TID and Lisinopril daily. Blood pressure at home: forgot blood pressure log but states that it has been less than 170/90s Blood pressure today: 170/96, repeat 172/98 ROS: Endorses occ headache.  Denies dizziness, visual changes, nausea, vomiting, chest pain, abdominal pain or shortness of breath.  2. Back pain Patient reports that Prednisone helped with pain.  He notes that he has Ortho appt coming up in 3 weeks.  He reports recurrent pain on right side of low back radiating down right thigh that is baseline.  No falls, saddle anesthesia, fecal incontinence or urinary retention.  3. Insomnia Patient reports that over the last week he has been sleeping less. He reports he is able to get to sleep but wakes up after about 45 minutes and cannot go back to sleep for 1.5 hours.  He denies snoring, apnea, fluid intake before bed, noise, increased caffeine use.  He drinks 2 cups of coffee in the morning and seldom drinks soda.  He does not drink ETOH anymore.  He has been seeing a new love interest recently.  Social Hx reviewed: intermittent cigar smoker. MedHx, medications and allergies reviewed.  Please see EMR. Health Maintenance: Hep C screen ROS: Per HPI  Objective: Office vital signs reviewed. BP (!) 172/98   Pulse 80   Temp 98.4 F (36.9 C) (Oral)   Ht 5\' 6"  (1.676 m)   Wt 176 lb 9.6 oz (80.1 kg)   SpO2 98%   BMI 28.50 kg/m   Physical Examination:  General: Awake, alert, well nourished, No acute distress Neck: girth normal Cardio: regular rate and rhythm, S1S2 heard, no murmurs appreciated Pulm: clear to auscultation bilaterally, no wheezes, rhonchi or rales;  normal work of breathing on room air Extremities: warm, well perfused, No edema, cyanosis or clubbing; +2 pulses bilaterally MSK: normal gait and normal station; full AROM, mild TTP to lumbosacral paraspinal muscles R>L.   Neuro: 5/5 LE Strength and light touch sensation grossly intact, moves all extremities independently  Assessment/ Plan: 66 y.o. male   HYPERTENSION, BENIGN SYSTEMIC Persistently elevated despite Verapamil and Lisinopril compliance.  Patient still smoking cigars intermittently.  He has been modifying other areas of his lifestyle w/ regular exercise.  I have increased his Lisinopril from 10mg  to 20mg .  Will check BMP and repeat BP next week w/ RN.  If persistently >140/90, patient to follow up with me in clinic.  Will consider referral for sleep study at that time.  Return precautions discussed.    Back pain Patient has appt w/ [facility] in 3 weeks.  Recommend topical analgesic for now ok to take Tylenol/ Motrin prn.  Exam unchanged from previous.  No red flags.  Will defer imaging studies to ortho.  Insomnia We discussed sleep hygiene, avoidance of ETOH and caffeine.  Recommended starting with melatonin 3mg  at bedtime.  Follow up if no improvement.  Will consider sleep study as above, as he also has uncontrolled hypertension.   Janora Norlander, DO PGY-3, [facility]

## 2016-11-28 NOTE — Assessment & Plan Note (Signed)
We discussed sleep hygiene, avoidance of ETOH and caffeine.  Recommended starting with melatonin 3mg  at bedtime.  Follow up if no improvement.  Will consider sleep study as above, as he also has uncontrolled hypertension.

## 2016-11-28 NOTE — Assessment & Plan Note (Addendum)
Patient has appt w/ [facility] in 3 weeks.  Recommend topical analgesic for now ok to take Tylenol/ Motrin prn.  Exam unchanged from previous.  No red flags.  Will defer imaging studies to ortho.

## 2016-12-05 ENCOUNTER — Ambulatory Visit (INDEPENDENT_AMBULATORY_CARE_PROVIDER_SITE_OTHER): Payer: Medicare Other | Admitting: *Deleted

## 2016-12-05 ENCOUNTER — Other Ambulatory Visit: Payer: Self-pay | Admitting: Family Medicine

## 2016-12-05 VITALS — BP 170/102 | HR 80

## 2016-12-05 DIAGNOSIS — Z0130 Encounter for examination of blood pressure without abnormal findings: Secondary | ICD-10-CM

## 2016-12-05 DIAGNOSIS — I10 Essential (primary) hypertension: Secondary | ICD-10-CM

## 2016-12-05 MED ORDER — LISINOPRIL 20 MG PO TABS: 40.0000 mg | ORAL_TABLET | Freq: Every day | ORAL | 0 refills | Status: DC

## 2016-12-05 NOTE — Progress Notes (Signed)
    Patient here today for BP check.    BP today is 180/110 and 170/102 manually.  Checked BP in left arm with normal adult cuff.  Symptoms present: none.  Patient last took BP med this morning today.  Routed note to PCP.    Derl Barrow, RN

## 2016-12-05 NOTE — Progress Notes (Signed)
Patient with persistently elevated BP.  I will increase his Lisinopril to 40mg .  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  I have also ordered a sleep study given his persistently elevated BPs.  Orders Placed This Encounter  Procedures  . Lipid Panel    Standing Status:   Future    Standing Expiration Date:   12/05/2017  . Hepatitis C antibody    Standing Status:   Future    Standing Expiration Date:   12/05/2017  . Basic Metabolic Panel    Standing Status:   Future    Standing Expiration Date:   12/05/2017  . Split night study    Standing Status:   Future    Standing Expiration Date:   12/05/2017    Order Specific Question:   Where should this test be performed:    Answer:   [facility] - Pulmonary    Suellen Durocher M. Lajuana Ripple, DO PGY-3, [facility]

## 2016-12-11 ENCOUNTER — Other Ambulatory Visit: Payer: Self-pay | Admitting: Family Medicine

## 2016-12-11 DIAGNOSIS — I10 Essential (primary) hypertension: Secondary | ICD-10-CM | POA: Diagnosis not present

## 2016-12-12 LAB — LIPID PANEL
CHOL/HDL RATIO: 3.3 ratio (ref 0.0–5.0)
Cholesterol, Total: 111 mg/dL (ref 100–199)
HDL: 34 mg/dL — ABNORMAL LOW (ref >39)
LDL CALC: 32 mg/dL (ref 0–99)
TRIGLYCERIDES: 227 mg/dL — AB (ref 0–149)
VLDL Cholesterol Cal: 45 mg/dL — ABNORMAL HIGH (ref 5–40)

## 2016-12-12 LAB — BASIC METABOLIC PANEL
BUN / CREAT RATIO: 10 (ref 10–24)
BUN: 14 mg/dL (ref 8–27)
CO2: 19 mmol/L (ref 18–29)
CREATININE: 1.36 mg/dL — AB (ref 0.76–1.27)
Calcium: 9.3 mg/dL (ref 8.6–10.2)
Chloride: 109 mmol/L — ABNORMAL HIGH (ref 96–106)
GFR calc Af Amer: 62 mL/min/{1.73_m2} (ref >59)
GFR, EST NON AFRICAN AMERICAN: 54 mL/min/{1.73_m2} — AB (ref >59)
Glucose: 115 mg/dL — ABNORMAL HIGH (ref 65–99)
Potassium: 4.2 mmol/L (ref 3.5–5.2)
SODIUM: 144 mmol/L (ref 134–144)

## 2016-12-12 LAB — HEPATITIS C ANTIBODY: Hep C Virus Ab: <0.1 s/co ratio (ref 0.0–0.9)

## 2016-12-17 ENCOUNTER — Ambulatory Visit (INDEPENDENT_AMBULATORY_CARE_PROVIDER_SITE_OTHER): Payer: Medicare Other | Admitting: Orthopaedic Surgery

## 2016-12-18 ENCOUNTER — Ambulatory Visit: Payer: Medicare Other | Admitting: Family Medicine

## 2016-12-29 ENCOUNTER — Other Ambulatory Visit: Payer: Self-pay | Admitting: Family Medicine

## 2016-12-29 ENCOUNTER — Ambulatory Visit (INDEPENDENT_AMBULATORY_CARE_PROVIDER_SITE_OTHER): Payer: Medicare Other | Admitting: Family Medicine

## 2016-12-29 ENCOUNTER — Encounter: Payer: Self-pay | Admitting: Family Medicine

## 2016-12-29 VITALS — BP 152/80 | HR 68 | Temp 98.3°F | Ht 66.0 in | Wt 176.0 lb

## 2016-12-29 DIAGNOSIS — R0683 Snoring: Secondary | ICD-10-CM

## 2016-12-29 DIAGNOSIS — K219 Gastro-esophageal reflux disease without esophagitis: Secondary | ICD-10-CM

## 2016-12-29 DIAGNOSIS — I10 Essential (primary) hypertension: Secondary | ICD-10-CM | POA: Diagnosis not present

## 2016-12-29 MED ORDER — ALBUTEROL SULFATE HFA 108 (90 BASE) MCG/ACT IN AERS: 1.0000 | INHALATION_SPRAY | Freq: Four times a day (QID) | RESPIRATORY_TRACT | 1 refills | Status: DC | PRN

## 2016-12-29 MED ORDER — LISINOPRIL-HYDROCHLOROTHIAZIDE 20-12.5 MG PO TABS: 2.0000 | ORAL_TABLET | Freq: Every day | ORAL | 0 refills | Status: DC

## 2016-12-29 MED ORDER — OMEPRAZOLE 20 MG PO CPDR: 20.0000 mg | DELAYED_RELEASE_CAPSULE | Freq: Every day | ORAL | 1 refills | Status: DC

## 2016-12-29 NOTE — Patient Instructions (Addendum)
I recommend that you take Melatonin, which is available over the counter.  Take every night at bedtime.  I have ordered a sleep study for you.  I have added a new medication to your Lisinopril called Hydrochlorothiazide.  You will still take 2 tablets of this medication.  I'd like you to follow up in 1 week with the nurse for BP check and labs.

## 2016-12-29 NOTE — Assessment & Plan Note (Addendum)
Persistent uncontrolled hypertension.  Though marginally better than last check in March.  Systolic BP still not at goal.  He continues to smoke cigars intermittently.  We reviewed BMP, lipid panel.  I counseled on importance of tobacco cessation.  Will send for sleep study as well.  HCTZ added today.  If normal sleep study, will plan to send for renal ultrasound.  Patient to return for BP check with RN next week.  Will also recheck BMP, obtain aldosterone/ renin labs.  Patient to check BP and bring to next appt.

## 2016-12-29 NOTE — Progress Notes (Signed)
    Subjective: CC: HTN HPI: Cross Jorge is a 66 y.o. male presenting to clinic today for:  1. Hypertension Patient seen 3/2 and 3/9 for HTN.  BP elevated on Verapamil 120mg  TID and Lisinopril 20mg .  His Lisinopril dose was increased to 40mg  daily.  BMP showed a slight bump in Cr but still within normal limits and did not require discontinuation of medication.  Patient reports that since BP check 3/9, he has been taking meds as prescribed.  He has not stopped smoking cigars.  His fiance reports that he does snore, gasp, cough, and poor daytime energy.  He reports increased appetite.  Has eye appt coming up soon.  Blood pressure today: 162/80, repeat 152/80 Meds: Compliant with Verapamil and Lisinopril 40mg  ROS: Denies headache, dizziness, visual changes, nausea, vomiting, chest pain, abdominal pain or shortness of breath.  2. Tobacco use disorder Patient reports that he has not stopped smoking cigars.  He is using fireballs to help with oral fixation issues.  Social Hx reviewed: intermittent smoker. MedHx, medications and allergies reviewed.  Please see EMR. ROS: Per HPI  Objective: Office vital signs reviewed. BP (!) 152/80   Pulse 68   Temp 98.3 F (36.8 C) (Oral)   Ht 5\' 6"  (1.676 m)   Wt 176 lb (79.8 kg)   SpO2 98%   BMI 28.41 kg/m   Physical Examination:  General: Awake, alert, well nourished, well appearing, No acute distress, accompanied by his fiance HEENT: Normal    Eyes: PERRLA, EOMI, sclera white Cardio: regular rate and rhythm, S1S2 heard, no murmurs appreciated Pulm: clear to auscultation bilaterally, no wheezes, rhonchi or rales; normal work of breathing on room air Ext: WWP, no edema  Assessment/ Plan: 66 y.o. male   HYPERTENSION, BENIGN SYSTEMIC Persistent uncontrolled hypertension.  Though marginally better than last check in March.  Systolic BP still not at goal.  He continues to smoke cigars intermittently.  We reviewed BMP, lipid panel.  I counseled on  importance of tobacco cessation.  Will send for sleep study as well.  HCTZ added today.  If normal sleep study, will plan to send for renal ultrasound.  Patient to return for BP check with RN next week.  Will also recheck BMP, obtain aldosterone/ renin labs.  Patient to check BP and bring to next appt.  Resistant hypertension - Split night study; Future  Snoring: Evaluate for sleep apnea - Split night study; Future  Gastroesophageal reflux disease without esophagitis, controlled on PPI. - omeprazole (PRILOSEC) 20 MG capsule; Take 1 capsule (20 mg total) by mouth daily.  Dispense: 90 capsule; Refill: [place], DO PGY-3, [facility]

## 2017-01-04 ENCOUNTER — Other Ambulatory Visit: Payer: Self-pay | Admitting: Family Medicine

## 2017-01-04 ENCOUNTER — Other Ambulatory Visit: Payer: Self-pay | Admitting: Neurology

## 2017-01-07 ENCOUNTER — Ambulatory Visit (INDEPENDENT_AMBULATORY_CARE_PROVIDER_SITE_OTHER): Payer: Medicare Other | Admitting: *Deleted

## 2017-01-07 ENCOUNTER — Other Ambulatory Visit: Payer: Medicare Other

## 2017-01-07 VITALS — BP 140/88 | HR 80

## 2017-01-07 DIAGNOSIS — I10 Essential (primary) hypertension: Secondary | ICD-10-CM

## 2017-01-07 NOTE — Progress Notes (Signed)
Patient in today For BP check. Blood pressure taken manually in left arm was 140/88, pulse 80. Patient reports taking BP medication regularly. Instructed patient to get lab work while he was here and informed him that PCP would call if any medication changes were to be made.

## 2017-01-08 LAB — HEPATITIS C ANTIBODY: Hep C Virus Ab: <0.1 s/co ratio (ref 0.0–0.9)

## 2017-01-08 LAB — LIPID PANEL
CHOL/HDL RATIO: 4.2 ratio (ref 0.0–5.0)
Cholesterol, Total: 114 mg/dL (ref 100–199)
HDL: 27 mg/dL — ABNORMAL LOW (ref >39)
LDL CALC: 33 mg/dL (ref 0–99)
TRIGLYCERIDES: 269 mg/dL — AB (ref 0–149)
VLDL Cholesterol Cal: 54 mg/dL — ABNORMAL HIGH (ref 5–40)

## 2017-01-13 ENCOUNTER — Telehealth: Payer: Self-pay

## 2017-01-13 NOTE — Telephone Encounter (Signed)
Error

## 2017-01-22 ENCOUNTER — Ambulatory Visit: Payer: Medicare Other | Admitting: Neurology

## 2017-01-27 ENCOUNTER — Ambulatory Visit (INDEPENDENT_AMBULATORY_CARE_PROVIDER_SITE_OTHER): Payer: Medicare Other | Admitting: Orthopaedic Surgery

## 2017-01-29 ENCOUNTER — Telehealth: Payer: Self-pay | Admitting: Family Medicine

## 2017-01-29 ENCOUNTER — Ambulatory Visit: Payer: Medicare Other | Admitting: Family Medicine

## 2017-01-29 ENCOUNTER — Ambulatory Visit: Payer: Medicare Other

## 2017-01-29 ENCOUNTER — Other Ambulatory Visit: Payer: Self-pay | Admitting: Family Medicine

## 2017-01-29 DIAGNOSIS — I10 Essential (primary) hypertension: Secondary | ICD-10-CM

## 2017-01-29 DIAGNOSIS — H4089 Other specified glaucoma: Secondary | ICD-10-CM | POA: Diagnosis not present

## 2017-01-29 DIAGNOSIS — H2511 Age-related nuclear cataract, right eye: Secondary | ICD-10-CM | POA: Diagnosis not present

## 2017-01-29 DIAGNOSIS — H25812 Combined forms of age-related cataract, left eye: Secondary | ICD-10-CM | POA: Diagnosis not present

## 2017-01-29 MED ORDER — SILDENAFIL CITRATE 100 MG PO TABS: 50.0000 mg | ORAL_TABLET | Freq: Every day | ORAL | 0 refills | Status: DC | PRN

## 2017-01-29 NOTE — Telephone Encounter (Addendum)
Pt came in today and had lab work done. Ottis Stain, CMA

## 2017-01-29 NOTE — Telephone Encounter (Signed)
Called patient to discuss error regarding 4/11 labs.  He was supposed to have a Renin/ Angiotensin and BMP drawn.  Unfortunately, a duplicate Lipid and HCV screen was drawn.  I have spoken to [employer] and Westley Gambles regarding this error.  It is being investigated.  Patient will NOT be charged for 4/11 phlebotomy draw or redundant labs.  I would still like for him to have Renin/ Angiotensin and BMP.  He may come in for simply a lab visit today, so that he does not have to pay a copay to see me since we have no labs to review, unless he has additional concerns that he would like to discuss.  It appears that his BP was controlled at the 4/11 RN visit.  I have left a VM, asking him to call me regarding his labs.  Marien Manship M. Lajuana Ripple, DO PGY-3, [facility]

## 2017-01-29 NOTE — Progress Notes (Signed)
Patient comes in for lab visit.  He reports he would like refills on Topamax and Viagra 100mg .  He has not used Viagra in several years but is now in a relationship and would like to start using it again.  Has a h/o CAD.  NOT on Nitro.  He is aware of the risk of Viagra use and we used teach back method to review risks if he were to be administered nitro in the future.   Pertaining to his Topamax, this is prescribed by his neurologist.  I will defer to Dr Tomi Likens for refills on this.  Janella Rogala M. Lajuana Ripple, DO PGY-3, [facility]

## 2017-01-30 ENCOUNTER — Telehealth: Payer: Self-pay | Admitting: Family Medicine

## 2017-01-30 NOTE — Telephone Encounter (Signed)
Patient came to office stated medicaid would not cover payment for RX Viagra when sent to [facility]. Please send RX to [facility] [phone].  Please let patient know when  Complete.

## 2017-02-02 ENCOUNTER — Other Ambulatory Visit: Payer: Self-pay | Admitting: Family Medicine

## 2017-02-02 LAB — BASIC METABOLIC PANEL
BUN/Creatinine Ratio: 8 — ABNORMAL LOW (ref 10–24)
BUN: 12 mg/dL (ref 8–27)
CALCIUM: 9.3 mg/dL (ref 8.6–10.2)
CHLORIDE: 105 mmol/L (ref 96–106)
CO2: 22 mmol/L (ref 18–29)
Creatinine, Ser: 1.42 mg/dL — ABNORMAL HIGH (ref 0.76–1.27)
GFR, EST AFRICAN AMERICAN: 59 mL/min/{1.73_m2} — AB (ref >59)
GFR, EST NON AFRICAN AMERICAN: 51 mL/min/{1.73_m2} — AB (ref >59)
Glucose: 104 mg/dL — ABNORMAL HIGH (ref 65–99)
Potassium: 3.2 mmol/L — ABNORMAL LOW (ref 3.5–5.2)
Sodium: 143 mmol/L (ref 134–144)

## 2017-02-02 LAB — ALDOSTERONE + RENIN ACTIVITY W/ RATIO
ALDOS/RENIN RATIO: 1.4 (ref 0.0–30.0)
ALDOSTERONE: 1.6 ng/dL (ref 0.0–30.0)
Renin: 1.112 ng/mL/hr (ref 0.167–5.380)

## 2017-02-02 MED ORDER — SILDENAFIL CITRATE 100 MG PO TABS: 50.0000 mg | ORAL_TABLET | Freq: Every day | ORAL | 0 refills | Status: DC | PRN

## 2017-02-02 NOTE — Telephone Encounter (Signed)
Pt informed. Sharon T Saunders, CMA  

## 2017-02-02 NOTE — Telephone Encounter (Signed)
This has been done.

## 2017-02-04 ENCOUNTER — Telehealth: Payer: Self-pay | Admitting: Family Medicine

## 2017-02-04 NOTE — Telephone Encounter (Signed)
Attempted to reach patient at number on file.  No answer.  His labs show persistently elevated Cr and slightly low potassium.  I'd like him to follow up in office about this.  Please let him know if he returns my call.  I will also send a letter.  Rawlin Reaume M. Lajuana Ripple, DO PGY-3, [facility]

## 2017-02-05 NOTE — Telephone Encounter (Signed)
Pt was advised. Appointment scheduled for 02-18-17. ep

## 2017-02-06 ENCOUNTER — Encounter: Payer: Self-pay | Admitting: Neurology

## 2017-02-06 ENCOUNTER — Ambulatory Visit (INDEPENDENT_AMBULATORY_CARE_PROVIDER_SITE_OTHER): Payer: Medicare Other | Admitting: Neurology

## 2017-02-06 VITALS — BP 142/80 | HR 68 | Temp 97.6°F | Wt 181.0 lb

## 2017-02-06 DIAGNOSIS — G43009 Migraine without aura, not intractable, without status migrainosus: Secondary | ICD-10-CM | POA: Diagnosis not present

## 2017-02-06 DIAGNOSIS — F17200 Nicotine dependence, unspecified, uncomplicated: Secondary | ICD-10-CM | POA: Diagnosis not present

## 2017-02-06 DIAGNOSIS — I10 Essential (primary) hypertension: Secondary | ICD-10-CM

## 2017-02-06 MED ORDER — TOPIRAMATE 100 MG PO TABS: 100.0000 mg | ORAL_TABLET | Freq: Two times a day (BID) | ORAL | 5 refills | Status: DC

## 2017-02-06 MED ORDER — SUMATRIPTAN SUCCINATE 100 MG PO TABS: ORAL_TABLET | ORAL | 5 refills | Status: DC

## 2017-02-06 NOTE — Patient Instructions (Addendum)
Migraine Recommendations: 1.  Take topiramate 100mg , 1 tablet in morning and 1 tablet at bedtime 2.  Take sumatriptan at earliest onset of headache.  May repeat dose once in 2 hours if needed.  Do not exceed two tablets in 24 hours. 3.  Limit use of pain relievers to no more than 2 days out of the week.  These medications include acetaminophen, ibuprofen, triptans and narcotics.  This will help reduce risk of rebound headaches. 4.  Be aware of common food triggers such as processed sweets, processed foods with nitrites (such as deli meat, hot dogs, sausages), foods with MSG, alcohol (such as wine), chocolate, certain cheeses, certain fruits (dried fruits, some citrus fruit), vinegar, diet soda. 4.  Avoid caffeine 5.  Routine exercise 6.  Proper sleep hygiene 7.  Stay adequately hydrated with water 8.  Keep a headache diary. 9.  Maintain proper stress management. 10.  Do not skip meals. 11.  Consider supplements:  Magnesium citrate 400mg  to 600mg  daily, riboflavin 400mg , Coenzyme Q 10 100mg  three times daily 12.  Quit smoking 13.  Follow up in 6 months.

## 2017-02-06 NOTE — Progress Notes (Signed)
NEUROLOGY FOLLOW UP OFFICE NOTE  Jaime Barnes [id number]  HISTORY OF PRESENT ILLNESS: Jaime Barnes is a 66 year old right-handed male with hypertension, Bipolar disorder, CKD, tobacco user, Barrett's esophagus and cirrhosis who follows up for migraine.   UPDATE: Improved. Intensity:  10/10 Duration:  Briefly (minutes) with sumatriptan Frequency:  6 days per month. Current NSAIDS:  none Current analgesics:  none Current triptans:  sumatriptan 100mg  Current anti-emetic:  none Current muscle relaxants:  tizanidine Current Antihypertensive medications:  Lisinopril 10mg , verapamil 120mg  three times daily Current Antidepressant medications:  none Current Anticonvulsant medications:  topiramate 100mg  twice daily, gabapentin 300mg  twice daily  01/29/17:  BUN 12, Cr 1.42, GFR 59.  Renal function is currently being followed by his PCP.   HISTORY: Onset:  Has remote history of migraines but they returned in 2016 after experiencing increased stress related to his ex-girlfriend. Location:  Right sided Quality:  pounding Initial Intensity:  "50"/10 Aura:  no Prodrome:  no Associated symptoms:  Photophobia, phonophobia.  No nausea or visual disturbance Initial Duration:  Until takes medication Initial Frequency:  every other day Triggers/exacerbating factors:  stress Relieving factors:  none Activity:  Cannot function   Past abortive medication:  Sumatriptan 50mg  (needed 3 tablets for it to work), ibuprofen Past preventative medication:  none Other past therapy:  none   CT of head from 07/19/15 was unremarkable.    MRI of brain from 12/04/14 showed global atrophy and mild small vessel ischemic changes. 06/23/16:  Sed rate 25  PAST MEDICAL HISTORY: Past Medical History:  Diagnosis Date  . Barrett's esophagus 2011   Recommend repeat EGD for surveillance in 2 yrs-[facility]  . Bipolar disorder ([place])   . Chronic kidney disease   . Cirrhosis ([place])   . Diverticulosis of colon   . GANGLION  CYST, TENDON SHEATH 06/26/2010  . Hyperlipidemia   . Hypertension   . Impotence   . Migraine   . Pancreatitis   . Pulmonary nodule   . Pulmonary nodule seen on imaging study 02/06/2011   Followup CT in December 2014 with 20 months of stability at 4 mm.  No further imaging recommended     MEDICATIONS: Current Outpatient Prescriptions on File Prior to Visit  Medication Sig Dispense Refill  . albuterol (PROAIR HFA) 108 (90 Base) MCG/ACT inhaler Inhale 1-2 puffs into the lungs every 6 (six) hours as needed for wheezing or shortness of breath. Use 15 minutes prior to activity 8.5 g 1  . atorvastatin (LIPITOR) 40 MG tablet Take 1 tablet (40 mg total) by mouth daily. 90 tablet 3  . gabapentin (NEURONTIN) 300 MG capsule Take 1 capsule (300 mg total) by mouth 2 (two) times daily. 180 capsule 3  . hydrOXYzine (ATARAX/VISTARIL) 25 MG tablet Take 25 mg by mouth at bedtime as needed (headaches).   2  . lisinopril-hydrochlorothiazide (PRINZIDE,ZESTORETIC) 20-12.5 MG tablet TAKE 2 TABLETS BY MOUTH DAILY 180 tablet 0  . omeprazole (PRILOSEC) 20 MG capsule Take 1 capsule (20 mg total) by mouth daily. 90 capsule 1  . sildenafil (VIAGRA) 100 MG tablet Take 0.5-1 tablets (50-100 mg total) by mouth daily as needed for erectile dysfunction. 10 tablet 0  . tiZANidine (ZANAFLEX) 4 MG tablet TAKE 1 TABLET(4 MG) BY MOUTH AT BEDTIME AS NEEDED FOR MUSCLE SPASMS 30 tablet 0  . verapamil (CALAN) 120 MG tablet TAKE 1 TABLET(120 MG) BY MOUTH THREE TIMES DAILY 270 tablet 0  . [DISCONTINUED] verapamil (CALAN) 120 MG tablet Take 120 mg by  mouth 3 (three) times daily.      No current facility-administered medications on file prior to visit.     ALLERGIES: Allergies  Allergen Reactions  . Codeine Phosphate Other (See Comments)    REACTION: breathing difficulty  . Penicillins Rash and Other (See Comments)    Break out in sweats/ Rash amoxicillin. Denies airway involvement Has patient had a PCN reaction causing immediate  rash, facial/tongue/throat swelling, SOB or lightheadedness with hypotension: Yes Has patient had a PCN reaction causing severe rash involving mucus membranes or skin necrosis: Yes Has patient had a PCN reaction that required hospitalization No Has patient had a PCN reaction occurring within the last 10 years: No If all of the above answers are "NO", then may proceed with Cephalosporin u    FAMILY HISTORY: Family History  Problem Relation Age of Onset  . Anemia Mother   . Hypertension Daughter   . Diabetes Daughter   . Migraines Daughter     SOCIAL HISTORY: Social History   Social History  . Marital status: Legally Separated    Spouse name: N/A  . Number of children: N/A  . Years of education: N/A   Occupational History  . Not on file.   Social History Main Topics  . Smoking status: Current Some Day Smoker    Packs/day: 0.25    Years: 53.00    Types: Cigarettes, Cigars    Start date: 09/29/1962    Last attempt to quit: 08/04/2016  . Smokeless tobacco: Never Used  . Alcohol use No     Comment: Last drink "30 years ago"  . Drug use: Yes    Types: Marijuana     Comment: daily marijuana since teenager.   [name] Sexual activity: Yes    Birth control/ protection: Condom     Comment: Multiple sex partners. 60 in last year.   Other Topics Concern  . Not on file   Social History Narrative   Pt lives alone. No stairs in home. Pt has completed 8th grade.     REVIEW OF SYSTEMS: Constitutional: No fevers, chills, or sweats, no generalized fatigue, change in appetite Eyes: No visual changes, double vision, eye pain Ear, nose and throat: No hearing loss, ear pain, nasal congestion, sore throat Cardiovascular: No chest pain, palpitations Respiratory:  No shortness of breath at rest or with exertion, wheezes GastrointestinaI: No nausea, vomiting, diarrhea, abdominal pain, fecal incontinence Genitourinary:  No dysuria, urinary retention or frequency Musculoskeletal:  No neck pain,  back pain Integumentary: No rash, pruritus, skin lesions Neurological: as above Psychiatric: No depression, insomnia, anxiety Endocrine: No palpitations, fatigue, diaphoresis, mood swings, change in appetite, change in weight, increased thirst Hematologic/Lymphatic:  No purpura, petechiae. Allergic/Immunologic: no itchy/runny eyes, nasal congestion, recent allergic reactions, rashes  PHYSICAL EXAM: Vitals:   02/06/17 0725  BP: (!) 142/80  Pulse: 68  Temp: 97.6 F (36.4 C)   General: No acute distress.  Patient appears well-groomed.  normal body habitus. Head:  Normocephalic/atraumatic Eyes:  Fundi examined but not visualized Neck: supple, no paraspinal tenderness, full range of motion Heart:  Regular rate and rhythm Lungs:  Clear to auscultation bilaterally Back: No paraspinal tenderness Neurological Exam: alert and oriented to person, place, and time. Attention span and concentration intact, recent and remote memory intact, fund of knowledge intact.  Speech fluent and not dysarthric, language intact.  CN II-XII intact. Bulk and tone normal, muscle strength 5/5 throughout.  Sensation to light touch  intact.  Deep tendon reflexes 2+ throughout,  toes downgoing.  Finger to nose testing intact.  Gait normal, Romberg negative.  IMPRESSION: Migraines, improved Smoking cessation HTN  PLAN: 1.  Continue topiramate 100mg  twice daily 2.  Sumatriptan 100mg  as needed 3.  Smoking cessation 4.  BP borderline elevated, monitor with PCP 5.  Follow up in 6 months.  Metta Clines, DO  CC: Ronnie Doss, DO

## 2017-02-17 ENCOUNTER — Encounter (HOSPITAL_BASED_OUTPATIENT_CLINIC_OR_DEPARTMENT_OTHER): Payer: Medicare Other

## 2017-02-18 ENCOUNTER — Ambulatory Visit: Payer: Medicare Other | Admitting: Podiatry

## 2017-02-18 ENCOUNTER — Ambulatory Visit: Payer: Medicare Other | Admitting: Family Medicine

## 2017-02-26 ENCOUNTER — Other Ambulatory Visit: Payer: Self-pay | Admitting: Family Medicine

## 2017-02-26 DIAGNOSIS — I10 Essential (primary) hypertension: Secondary | ICD-10-CM

## 2017-03-03 ENCOUNTER — Ambulatory Visit (INDEPENDENT_AMBULATORY_CARE_PROVIDER_SITE_OTHER): Payer: Medicare Other | Admitting: Family Medicine

## 2017-03-03 ENCOUNTER — Encounter: Payer: Self-pay | Admitting: Family Medicine

## 2017-03-03 ENCOUNTER — Other Ambulatory Visit: Payer: Self-pay | Admitting: Family Medicine

## 2017-03-03 VITALS — BP 118/78 | HR 76 | Temp 97.9°F | Ht 66.0 in | Wt 180.4 lb

## 2017-03-03 DIAGNOSIS — R7989 Other specified abnormal findings of blood chemistry: Secondary | ICD-10-CM

## 2017-03-03 DIAGNOSIS — N183 Chronic kidney disease, stage 3 unspecified: Secondary | ICD-10-CM | POA: Insufficient documentation

## 2017-03-03 DIAGNOSIS — I10 Essential (primary) hypertension: Secondary | ICD-10-CM | POA: Diagnosis not present

## 2017-03-03 MED ORDER — TIZANIDINE HCL 4 MG PO TABS: ORAL_TABLET | ORAL | 0 refills | Status: DC

## 2017-03-03 NOTE — Assessment & Plan Note (Signed)
CKD 3a.  Likely HTN induced as BP was uncontrolled for some time.  Repeat BMP today.  Renal u/s ordered.   Referral to renal.  Continue ACE-I.

## 2017-03-03 NOTE — Progress Notes (Signed)
    Subjective: CC: HTN, elevated Cr HPI: Jaime Barnes is a 66 y.o. male presenting to clinic today for:  Hypertension Meds: Compliant with Lisinopril HCTZ 40/25, Verapamil 120mg  (prescribed TID but taking BID) ROS: Denies headache, dizziness, visual changes, nausea, vomiting, chest pain, abdominal pain or shortness of breath.  Elevated Cr Has had intermittently elevated Cr since 2016.  With peak Cr 2.60 in 06/2015.  Baseline prior to this appears to be 1.14-1.25.  He was Lisinopril 10/12.5 until 11/2016 when it was increased to 20/12.5 for BP 172/98.  At follow up BP check, BP 170/102.  Lisinopril/ HCTZ increased to 40/25.  BP has been well controlled since then but Cr rising slightly but not enough to warrant discontinuation of ACE-I.  It does not look like he's ever seen a nephrologist for h/o elevated Cr.  Denies difficulty urinating, decrease in UOP, abdominal pain.  He thinks he hydrates well.  He smokes intermittently and has a h/o several years of tobacco use.  No dx of diabetes but has h/o CAD w.out prior h/o MI.  Social Hx reviewed: former tobacco/intermittent cigar smoker. MedHx, medications and allergies reviewed.  Please see EMR. ROS: Per HPI  Objective: Office vital signs reviewed. BP 118/78   Pulse 76   Temp 97.9 F (36.6 C) (Oral)   Ht 5\' 6"  (1.676 m)   Wt 180 lb 6.4 oz (81.8 kg)   SpO2 98%   BMI 29.12 kg/m   Physical Examination:  General: Awake, alert, well nourished, No acute distress HEENT: Normal, MMM    Eyes: PERRLA, EOMI, sclera white Cardio: regular rate and rhythm, S1S2 heard, no murmurs appreciated Pulm: clear to auscultation bilaterally, no wheezes, rhonchi or rales; normal work of breathing on room air Extremities: warm, well perfused, No edema, cyanosis or clubbing; +2 pulses bilaterally MSK: normal gait and normal station Skin: dry; intact; no rashes or lesions  Assessment/ Plan: 66 y.o. male   CKD (chronic kidney disease) stage 3, GFR 30-59  ml/min CKD 3a.  Likely HTN induced as BP was uncontrolled for some time.  Repeat BMP today.  Renal u/s ordered.   Referral to renal.  Continue ACE-I.  HYPERTENSION, BENIGN SYSTEMIC Well controlled on Zestoretic 40/25mg  and Calan 120 TID (which he is actually taking BID).  Could consider putting on long acting verapamil in future.  Repeat BMP today.  Has Sleep study scheduled.   Janora Norlander, DO PGY-3, [facility]

## 2017-03-03 NOTE — Assessment & Plan Note (Addendum)
Well controlled on Zestoretic 40/25mg  and Calan 120 TID (which he is actually taking BID).  Could consider putting on long acting verapamil in future.  Repeat BMP today.  Has Sleep study scheduled.

## 2017-03-03 NOTE — Patient Instructions (Signed)
I will contact you will the results of your labs.  If anything is abnormal, I will call you.  Otherwise, expect a copy to be mailed to you.  I have ordered an ultrasound of your kidneys and placed a referral to a kidney specialist for evaluation.  Your blood pressure is at goal today.  This is great news!

## 2017-03-04 ENCOUNTER — Ambulatory Visit (INDEPENDENT_AMBULATORY_CARE_PROVIDER_SITE_OTHER): Payer: Medicare Other | Admitting: Podiatry

## 2017-03-04 ENCOUNTER — Encounter: Payer: Self-pay | Admitting: Podiatry

## 2017-03-04 DIAGNOSIS — M79676 Pain in unspecified toe(s): Secondary | ICD-10-CM | POA: Diagnosis not present

## 2017-03-04 DIAGNOSIS — B351 Tinea unguium: Secondary | ICD-10-CM | POA: Diagnosis not present

## 2017-03-04 LAB — SPECIMEN STATUS

## 2017-03-04 LAB — BASIC METABOLIC PANEL
BUN/Creatinine Ratio: 7 — ABNORMAL LOW (ref 10–24)
BUN: 11 mg/dL (ref 8–27)
CHLORIDE: 105 mmol/L (ref 96–106)
CO2: 22 mmol/L (ref 18–29)
CREATININE: 1.52 mg/dL — AB (ref 0.76–1.27)
Calcium: 9.4 mg/dL (ref 8.6–10.2)
GFR calc Af Amer: 54 mL/min/{1.73_m2} — ABNORMAL LOW (ref >59)
GFR calc non Af Amer: 47 mL/min/{1.73_m2} — ABNORMAL LOW (ref >59)
GLUCOSE: 71 mg/dL (ref 65–99)
Potassium: 3.5 mmol/L (ref 3.5–5.2)
SODIUM: 140 mmol/L (ref 134–144)

## 2017-03-04 NOTE — Progress Notes (Signed)
Patient ID: Jaime Barnes, male   DOB: [birth year], 66 y.o.   MRN: [id number] Complaint:  Visit Type: Patient returns to my office for continued preventative foot care services. Complaint: Patient states" my nails have grown long and thick and become painful to walk and wear shoes" . The patient presents for preventative foot care services. No changes to ROS  Podiatric Exam: Vascular: dorsalis pedis and posterior tibial pulses are palpable bilateral. Capillary return is immediate. Temperature gradient is WNL. Skin turgor WNL  Sensorium: Normal Semmes Weinstein monofilament test. Normal tactile sensation bilaterally. Nail Exam: Pt has thick disfigured discolored nails with subungual debris noted bilateral entire nail hallux through fifth toenails Ulcer Exam: There is no evidence of ulcer or pre-ulcerative changes or infection. Orthopedic Exam: Muscle tone and strength are WNL. No limitations in general ROM. No crepitus or effusions noted. Foot type and digits show no abnormalities. Bony prominences are unremarkable. Skin: No Porokeratosis. No infection or ulcers  Diagnosis:  Onychomycosis, , Pain in right toe, pain in left toes  Treatment & Plan Procedures and Treatment: Consent by patient was obtained for treatment procedures. The patient understood the discussion of treatment and procedures well. All questions were answered thoroughly reviewed. Debridement of mycotic and hypertrophic toenails, 1 through 5 bilateral and clearing of subungual debris. No ulceration, no infection noted.   Return Visit-Office Procedure: Patient instructed to return to the office for a follow up visit 3 months for continued evaluation and treatment.    Gardiner Barefoot DPM

## 2017-03-06 ENCOUNTER — Ambulatory Visit
Admission: RE | Admit: 2017-03-06 | Discharge: 2017-03-06 | Disposition: A | Discharge status: Home / Self Care | Payer: Medicare Other | Source: Ambulatory Visit | Attending: Family Medicine | Admitting: Family Medicine

## 2017-03-06 DIAGNOSIS — R7989 Other specified abnormal findings of blood chemistry: Secondary | ICD-10-CM

## 2017-03-06 DIAGNOSIS — N281 Cyst of kidney, acquired: Secondary | ICD-10-CM | POA: Diagnosis not present

## 2017-03-10 ENCOUNTER — Telehealth: Payer: Self-pay | Admitting: Family Medicine

## 2017-03-10 NOTE — Telephone Encounter (Signed)
Discussed lab results and ultrasound results with patient.  He is still awaiting renal appointment.  He notes some dizziness/ vertigo symptoms with positional changes.  He will schedule an appt for evaluation of this with me.

## 2017-03-11 ENCOUNTER — Encounter: Payer: Self-pay | Admitting: Family Medicine

## 2017-03-11 ENCOUNTER — Ambulatory Visit (INDEPENDENT_AMBULATORY_CARE_PROVIDER_SITE_OTHER): Payer: Medicare Other | Admitting: Family Medicine

## 2017-03-11 ENCOUNTER — Ambulatory Visit (HOSPITAL_COMMUNITY)
Admission: RE | Admit: 2017-03-11 | Discharge: 2017-03-11 | Disposition: A | Discharge status: Home / Self Care | Payer: Medicare Other | Source: Ambulatory Visit | Attending: Family Medicine | Admitting: Family Medicine

## 2017-03-11 VITALS — BP 128/76 | HR 76 | Temp 97.9°F | Ht 66.0 in | Wt 177.2 lb

## 2017-03-11 DIAGNOSIS — R9431 Abnormal electrocardiogram [ECG] [EKG]: Secondary | ICD-10-CM | POA: Insufficient documentation

## 2017-03-11 DIAGNOSIS — I499 Cardiac arrhythmia, unspecified: Secondary | ICD-10-CM | POA: Diagnosis not present

## 2017-03-11 DIAGNOSIS — R42 Dizziness and giddiness: Secondary | ICD-10-CM | POA: Diagnosis not present

## 2017-03-11 MED ORDER — MECLIZINE HCL 32 MG PO TABS: 32.0000 mg | ORAL_TABLET | Freq: Three times a day (TID) | ORAL | 0 refills | Status: DC | PRN

## 2017-03-11 MED ORDER — MECLIZINE HCL 12.5 MG PO TABS: 12.5000 mg | ORAL_TABLET | Freq: Three times a day (TID) | ORAL | 0 refills | Status: DC | PRN

## 2017-03-11 NOTE — Progress Notes (Signed)
Subjective:     Patient ID: Jaime Barnes, male   DOB: May 13, 1951, 66 y.o.   MRN: [id number]  HPI Mr. Duchene is a 66yo male presenting today for dizziness. Notes several month history of dizziness. When asked to describe, reports he feels lightheaded and like he is going to pass out. Denies room spinning. Feels better after laying down for a few minutes. Denies worsening when turning his head or rolling over in bed. Usually worse when bending over--specifies that it occurs while bending over and not on standing afterwards. Denies chest pain, palpitations, shortness of breath. Denies orthopnea or paroxysmal nocturnal dyspnea. Denies nausea or vomiting. Denies headache. Does not occur when reaching up to grab something or lifting his arms. Denies falling due to dizziness.  Reports he feels similar to pre-syncope noted in 2016. Chart review shows in 10/2014 he presented with 18mo history of lightheadedness that improved on laying down. Again denied worsening with standing or changes in head motion. MRI obtained and showed global atrophy with global cerebellar atrophy. Reported improvement of symptoms with Meclizine.  Denied dizziness in 04/2015.  Review of Systems Per HPI    Objective:   Physical Exam  Constitutional: He is oriented to person, place, and time. He appears well-developed and well-nourished. No distress.  Cardiovascular: Normal rate.   No murmur heard. Irregular rhythm auscultated  Pulmonary/Chest: Effort normal and breath sounds normal. No respiratory distress.  Musculoskeletal:  Muscle strength 5/5 in upper and lower extremities  Neurological: He is alert and oriented to person, place, and time. No cranial nerve deficit.  Sensation intact  Psychiatric: He has a normal mood and affect. His behavior is normal.      Assessment and Plan:     1. Dizziness Chronic. Unknown etiology. EKG with PVCs, otherwise unchanged. Will obtain Vitamin B12, CBC, and TSH. Has had recent BMP. Follows  with Cardiology, recommend scheduling appointment to rule out cardiac etiology of symptoms. Prescription for Meclizine given due to improvement with this medication in 2016. Consider repeat imaging of brain if no improvement or symptoms worsen. Follow up with PCP.

## 2017-03-11 NOTE — Patient Instructions (Addendum)
We will obtain some labs today. I have sent a medication to the pharmacy that will hopefully help with the dizziness. Please follow up with your Cardiologist.  Dr. Gerlean Ren   Near-Syncope Near-syncope is when you suddenly become weak or dizzy, or you feel like you might pass out (faint). During an episode of near-syncope, you may:  Feel dizzy or light-headed.  Feel nauseous.  See all white or all black in your field of vision.  Have cold, clammy skin.  This condition is caused by a sudden decrease in blood flow to the brain. This decrease can result from various causes, but most of those causes are not dangerous. However, near-syncope can be a sign of a serious medical problem, so it is important to seek medical care. If you fainted, get medical help right away.Call your local emergency services (911 in the [place] Do not drive yourself to the hospital. Follow these instructions at home: Pay attention to any changes in your symptoms. Take these actions to help with your condition:  Keep all follow-up visits as told by your health care provider. This is important.  If you start to feel like you might faint, lie down right away and raise (elevate) your feet above the level of your heart. Breathe deeply and steadily. Wait until all of the symptoms have passed.  Drink enough fluid to keep your urine clear or pale yellow.  If you are taking blood pressure or heart medicine, get up slowly and take several minutes to sit and then stand. This can reduce dizziness.  Take over-the-counter and prescription medicines only as told by your health care provider.  Get help right away if:  You have a severe headache.  You have unusual pain in your chest, abdomen, or back.  You are bleeding from your mouth or rectum, or you have black or tarry stool.  You have a very fast or irregular heartbeat (palpitations).  You faint once or repeatedly.  You have a seizure.  You are confused.  You have  trouble walking.  You have severe weakness.  You have vision problems. These symptoms may represent a serious problem that is an emergency. Do not wait to see if your symptoms will go away. Get medical help right away. Call your local emergency services (911 in the [place] Do not drive yourself to the hospital. This information is not intended to replace advice given to you by your health care provider. Make sure you discuss any questions you have with your health care provider. Document Released: 09/15/2005 Document Revised: 02/21/2016 Document Reviewed: 05/30/2015 Elsevier Interactive Patient Education  2017 [employer].

## 2017-03-12 ENCOUNTER — Telehealth: Payer: Self-pay | Admitting: Family Medicine

## 2017-03-12 LAB — CBC
Hematocrit: 42 % (ref 37.5–51.0)
Hemoglobin: 14.3 g/dL (ref 13.0–17.7)
MCH: 31.2 pg (ref 26.6–33.0)
MCHC: 34 g/dL (ref 31.5–35.7)
MCV: 92 fL (ref 79–97)
PLATELETS: 230 10*3/uL (ref 150–379)
RBC: 4.58 x10E6/uL (ref 4.14–5.80)
RDW: 14.5 % (ref 12.3–15.4)
WBC: 11.1 10*3/uL — AB (ref 3.4–10.8)

## 2017-03-12 LAB — TSH: TSH: 4.28 u[IU]/mL (ref 0.450–4.500)

## 2017-03-12 LAB — VITAMIN B12: Vitamin B-12: 342 pg/mL (ref 232–1245)

## 2017-03-12 NOTE — Telephone Encounter (Signed)
Please let [name] know his labs obtained 6/13 were normal. He should follow up with his Cardiologist as instructed.

## 2017-03-12 NOTE — Telephone Encounter (Signed)
LVM for pt to call office back to inform them of below. Katharina Caper, April D, [place]

## 2017-03-12 NOTE — Telephone Encounter (Signed)
Pt was advised. ep °

## 2017-03-16 ENCOUNTER — Telehealth: Payer: Self-pay | Admitting: Family Medicine

## 2017-03-16 NOTE — Telephone Encounter (Signed)
Pt called because he only has 4 BP pills left, can we send in a refill jw

## 2017-03-17 ENCOUNTER — Other Ambulatory Visit: Payer: Self-pay | Admitting: Family Medicine

## 2017-03-17 MED ORDER — VERAPAMIL HCL 120 MG PO TABS: ORAL_TABLET | ORAL | 0 refills | Status: DC

## 2017-03-17 NOTE — Telephone Encounter (Signed)
LVM for pt to call the office. If he calls, please give him the information below. Ottis Stain, CMA

## 2017-03-17 NOTE — Telephone Encounter (Signed)
Verapamil sent in.  Lisinopril was just filled 5/31 for a 90 day supply.

## 2017-03-20 ENCOUNTER — Encounter: Payer: Self-pay | Admitting: Family Medicine

## 2017-03-20 ENCOUNTER — Ambulatory Visit (INDEPENDENT_AMBULATORY_CARE_PROVIDER_SITE_OTHER): Payer: Medicare Other | Admitting: Family Medicine

## 2017-03-20 VITALS — BP 115/80 | HR 66 | Temp 97.8°F | Ht 66.0 in | Wt 169.0 lb

## 2017-03-20 DIAGNOSIS — I10 Essential (primary) hypertension: Secondary | ICD-10-CM | POA: Diagnosis not present

## 2017-03-20 DIAGNOSIS — N183 Chronic kidney disease, stage 3 unspecified: Secondary | ICD-10-CM

## 2017-03-20 DIAGNOSIS — R197 Diarrhea, unspecified: Secondary | ICD-10-CM | POA: Diagnosis not present

## 2017-03-20 NOTE — Assessment & Plan Note (Signed)
BP well controlled.  Continue current therapies.

## 2017-03-20 NOTE — Progress Notes (Signed)
    Subjective: CC: HTN, cold HPI: Jaime Barnes is a 66 y.o. male presenting to clinic today for:  1. Hypertension Meds: Compliant with Zestoretic, Verapamil Side effects: none ROS: Denies headache, dizziness, visual changes, nausea, vomiting, chest pain, abdominal pain or shortness of breath.  2. Cold Patient reports cold like symptoms (cough, congestion, rhinorrhea) that started on Sunday.  These symptoms seem to be improving but he has also had associated nonbloody diarrhea.  He reports 1 episode of nausea w/ NBNB vomiting on Sunday.  Has been tolerating PO without difficulty since.  He denies SOB, fevers, chills.  His girlfriend was sick with similar.  3. CKD Patient reports good urine output.  No back pain.  Compliant with antihypertensives.  Social Hx reviewed: former smoker. MedHx, medications and allergies reviewed.  Please see EMR. ROS: Per HPI  Objective: Office vital signs reviewed. BP 115/80 (BP Location: Left Arm, Patient Position: Sitting, Cuff Size: Normal)   Pulse 66   Temp 97.8 F (36.6 C) (Oral)   Ht 5\' 6"  (1.676 m)   Wt 169 lb (76.7 kg)   SpO2 98%   BMI 27.28 kg/m   Physical Examination:  General: Awake, alert, well nourished, No acute distress HEENT: Normal    Neck: No masses palpated. No lymphadenopathy    Ears: Tympanic membranes intact, normal light reflex, no erythema, no bulging    Eyes: PERRLA, EOMI, sclera white    Nose: nasal turbinates moist, no nasal discharge    Throat: moist mucus membranes, no erythema, no tonsillar exudate.  Airway is patent Cardio: regular rate and rhythm, S1S2 heard, no murmurs appreciated Pulm: clear to auscultation bilaterally, no wheezes, rhonchi or rales; normal work of breathing on room air GI: soft, non-tender, non-distended, bowel sounds present x4, no hepatomegaly, no splenomegaly, no masses Ext: WWP, no edema  Assessment/ Plan: 66 y.o. male   HYPERTENSION, BENIGN SYSTEMIC BP well controlled.  Continue  current therapies.  CKD (chronic kidney disease) stage 3, GFR 30-59 ml/min Reviewed renal ultrasound with patient.  Will have renal see for completion, patient somewhat anxious about CKD diagnosis (though has had dx for several years).  Continue ACE-I for renal protection.  Diarrhea, unspecified type likely viral in nature, given recent URI.   Supportive care.  Diet to reduce diarrhea handout provided.  Return precautions reviewed.  Follow up 3 months or prn.  Janora Norlander, DO PGY-3, [facility]

## 2017-03-20 NOTE — Assessment & Plan Note (Signed)
Reviewed renal ultrasound with patient.  Will have renal see for completion, patient somewhat anxious about CKD diagnosis (though has had dx for several years).  Continue ACE-I for renal protection.

## 2017-03-20 NOTE — Patient Instructions (Signed)
Call me if you do not hear from the kidney specialist with an appointment next week.   Chronic Kidney Disease, Adult Chronic kidney disease (CKD) happens when the kidneys are damaged during a time of 3 or more months. The kidneys are two organs that do many important jobs in the body. These jobs include:  Removing wastes and extra fluids from the blood.  Making hormones that maintain the amount of fluid in your tissues and blood vessels.  Making sure that the body has the right amount of fluids and chemicals.  Most of the time, this condition does not go away, but it can usually be controlled. Steps must be taken to slow down the kidney damage or stop it from getting worse. Otherwise, the kidneys may stop working. Follow these instructions at home:  Follow your diet as told by your doctor. You may need to avoid alcohol, salty foods (sodium), and foods that are high in potassium, calcium, and protein.  Take over-the-counter and prescription medicines only as told by your doctor. Do not take any new medicines unless your doctor says you can do that. These include vitamins and minerals. ? Medicines and nutritional supplements can make kidney damage worse. ? Your doctor may need to change how much medicine you take.  Do not use any tobacco products. These include cigarettes, chewing tobacco, and e-cigarettes. If you need help quitting, ask your doctor.  Keep all follow-up visits as told by your doctor. This is important.  Check your blood pressure. Tell your doctor if there are changes to your blood pressure.  Get to a healthy weight. Stay at that weight. If you need help with this, ask your doctor.  Start or continue an exercise plan. Try to exercise at least 30 minutes a day, 5 days a week.  Stay up-to-date with your shots (immunizations) as told by your doctor. Contact a doctor if:  Your symptoms get worse.  You have new symptoms. Get help right away if:  You have symptoms of  end-stage kidney disease. These include: ? Headaches. ? Skin that is darker or lighter than normal. ? Numbness in your hands or feet. ? Easy bruising. ? Having hiccups often. ? Chest pain. ? Shortness of breath. ? Stopping of menstrual periods in women.  You have a fever.  You are making very little pee (urine).  You have pain or bleeding when you pee (urinate). This information is not intended to replace advice given to you by your health care provider. Make sure you discuss any questions you have with your health care provider. Document Released: 12/10/2009 Document Revised: 02/21/2016 Document Reviewed: 05/14/2012 Elsevier Interactive Patient Education  2017 [place] Choices to Help Relieve Diarrhea, Adult When you have diarrhea, the foods you eat and your eating habits are very important. Choosing the right foods and drinks can help:  Relieve diarrhea.  Replace lost fluids and nutrients.  Prevent dehydration.  What general guidelines should I follow? Relieving diarrhea  Choose foods with less than 2 g or .07 oz. of fiber per serving.  Limit fats to less than 8 tsp (38 g or 1.34 oz.) a day.  Avoid the following: ? Foods and beverages sweetened with high-fructose corn syrup, honey, or sugar alcohols such as xylitol, sorbitol, and mannitol. ? Foods that contain a lot of fat or sugar. ? Fried, greasy, or spicy foods. ? High-fiber grains, breads, and cereals. ? Raw fruits and vegetables.  Eat foods that are rich in probiotics.  These foods include dairy products such as yogurt and fermented milk products. They help increase healthy bacteria in the stomach and intestines (gastrointestinal tract, or GI tract).  If you have lactose intolerance, avoid dairy products. These may make your diarrhea worse.  Take medicine to help stop diarrhea (antidiarrheal medicine) only as told by your health care provider. Replacing nutrients  Eat small meals or snacks every  3-4 hours.  Eat bland foods, such as white rice, toast, or baked potato, until your diarrhea starts to get better. Gradually reintroduce nutrient-rich foods as tolerated or as told by your health care provider. This includes: ? Well-cooked protein foods. ? Peeled, seeded, and soft-cooked fruits and vegetables. ? Low-fat dairy products.  Take vitamin and mineral supplements as told by your health care provider. Preventing dehydration   Start by sipping water or a special solution to prevent dehydration (oral rehydration solution, ORS). Urine that is clear or pale yellow means that you are getting enough fluid.  Try to drink at least 8-10 cups of fluid each day to help replace lost fluids.  You may add other liquids in addition to water, such as clear juice or decaffeinated sports drinks, as tolerated or as told by your health care provider.  Avoid drinks with caffeine, such as coffee, tea, or soft drinks.  Avoid alcohol. What foods are recommended? The items listed may not be a complete list. Talk with your health care provider about what dietary choices are best for you. Grains White rice. White, [place], or pita breads (fresh or toasted), including plain rolls, buns, or bagels. White pasta. Saltine, soda, or graham crackers. Pretzels. Low-fiber cereal. Cooked cereals made with water (such as cornmeal, farina, or cream cereals). Plain muffins. Matzo. Melba toast. Zwieback. Vegetables Potatoes (without the skin). Most well-cooked and canned vegetables without skins or seeds. Tender lettuce. Fruits Apple sauce. Fruits canned in juice. Cooked apricots, cherries, grapefruit, peaches, pears, or plums. Fresh bananas and cantaloupe. Meats and other protein foods Baked or boiled chicken. Eggs. Tofu. Fish. Seafood. Smooth nut butters. Ground or well-cooked tender beef, ham, veal, lamb, pork, or poultry. Dairy Plain yogurt, kefir, and unsweetened liquid yogurt. Lactose-free milk, buttermilk, skim  milk, or soy milk. Low-fat or nonfat hard cheese. Beverages Water. Low-calorie sports drinks. Fruit juices without pulp. Strained tomato and vegetable juices. Decaffeinated teas. Sugar-free beverages not sweetened with sugar alcohols. Oral rehydration solutions, if approved by your health care provider. Seasoning and other foods Bouillon, broth, or soups made from recommended foods. What foods are not recommended? The items listed may not be a complete list. Talk with your health care provider about what dietary choices are best for you. Grains Whole grain, whole wheat, bran, or rye breads, rolls, pastas, and crackers. Wild or brown rice. Whole grain or bran cereals. Barley. Oats and oatmeal. Corn tortillas or taco shells. Granola. Popcorn. Vegetables Raw vegetables. Fried vegetables. Cabbage, broccoli, Brussels sprouts, artichokes, baked beans, beet greens, corn, kale, legumes, peas, sweet potatoes, and yams. Potato skins. Cooked spinach and cabbage. Fruits Dried fruit, including raisins and dates. Raw fruits. Stewed or dried prunes. Canned fruits with syrup. Meat and other protein foods Fried or fatty meats. Deli meats. Chunky nut butters. Nuts and seeds. Beans and lentils. Berniece Salines. Hot dogs. Sausage. Dairy High-fat cheeses. Whole milk, chocolate milk, and beverages made with milk, such as milk shakes. Half-and-half. Cream. sour cream. Ice cream. Beverages Caffeinated beverages (such as coffee, tea, soda, or energy drinks). Alcoholic beverages. Fruit juices with pulp. Prune juice.  Soft drinks sweetened with high-fructose corn syrup or sugar alcohols. High-calorie sports drinks. Fats and oils Butter. Cream sauces. Margarine. Salad oils. Plain salad dressings. Olives. Avocados. Mayonnaise. Sweets and desserts Sweet rolls, doughnuts, and sweet breads. Sugar-free desserts sweetened with sugar alcohols such as xylitol and sorbitol. Seasoning and other foods Honey. Hot sauce. Chili powder. Gravy.  Cream-based or milk-based soups. Pancakes and waffles. Summary  When you have diarrhea, the foods you eat and your eating habits are very important.  Make sure you get at least 8-10 cups of fluid each day, or enough to keep your urine clear or pale yellow.  Eat bland foods and gradually reintroduce healthy, nutrient-rich foods as tolerated, or as told by your health care provider.  Avoid high-fiber, fried, greasy, or spicy foods. This information is not intended to replace advice given to you by your health care provider. Make sure you discuss any questions you have with your health care provider. Document Released: 12/06/2003 Document Revised: 09/12/2016 Document Reviewed: 09/12/2016 Elsevier Interactive Patient Education  2017 [employer].

## 2017-03-22 ENCOUNTER — Other Ambulatory Visit: Payer: Self-pay | Admitting: Neurology

## 2017-03-23 ENCOUNTER — Telehealth: Payer: Self-pay | Admitting: Family Medicine

## 2017-03-23 NOTE — Telephone Encounter (Signed)
The referral was placed on Friday 6/22. It has not been processed yet.

## 2017-03-23 NOTE — Telephone Encounter (Signed)
Pt hasn't heard from nephrologist yet for an appointment. ep

## 2017-03-24 ENCOUNTER — Telehealth: Payer: Self-pay | Admitting: Family Medicine

## 2017-03-24 NOTE — Telephone Encounter (Signed)
Reiterated that referral is in process. He will receive a call later.  Reviewed paperwork.  It appears he had a transaminitis in 08/1996. ?related to Pravastatin.  Though difficult to tell as the medical records are handwritten.  Nevertheless, his LFTs have been normal here.  Last check 05/2016.

## 2017-03-29 NOTE — Progress Notes (Signed)
Cardiology Office Note   Date:  03/30/2017   ID:  Jaime Barnes, DOB [birth year], MRN [id number]  PCP:  Jaime Sires, DO  Cardiologist:   Jaime Latch, MD   No chief complaint on file.     History of Present Illness: Jaime Barnes is a 66 y.o. male with hypertension, hyperlipidemia non-obstructive CAD, and moderate PAD who presents for follow up.  Jaime Barnes was evaluated by his PCP's office on 04/2015 with symptoms concerning for claudication.  He reports one month of leg cramping and pain when walking up an incline. He has no pain when walking down hill or on flat land.  He underwent ABI testing that was negative for occlusive disease.  He was referred to cardiology due to suspicion that this was a false negative.  He was referred for exercise ABIs that showed 30-49% SFA disease without focal stenosis.  Jaime Barnes had a [place] 06/2004 that revealed 40% LAD, [name]  LVEF was 50%.   Jaime Barnes HCTZ-Lisinopril was increased on 05/06/15.  Since that time he has been feeling well.  He denies chest pain or shortness of breath.  He notes occasional episodes of indigestion that occur at rest.   It improves with Tums.  He notes some pain in his legs with walking that has been attributed to sciatica.  He walks for exercise Twice daily he notes occasional marks on his legs from his socks but denies overt edema. He denies orthopnea or PND. He does not have much salt in his diet. This morning he had 4 episodes of nonbloody, nonbilious emesis. He hasn't had any sick contacts. He is feeling well and currently denies any abdominal pain or nausea.   Past Medical History:  Diagnosis Date  . Barrett's esophagus 2011   Recommend repeat EGD for surveillance in 2 yrs-[facility]  . Bipolar disorder ([name])   . Chronic kidney disease   . Cirrhosis ([place])   . Diverticulosis of colon   . GANGLION CYST, TENDON SHEATH 06/26/2010  . Hyperlipidemia   . Hypertension   . Impotence   . Migraine   .  Pancreatitis   . Pulmonary nodule   . Pulmonary nodule seen on imaging study 02/06/2011   Followup CT in December 2014 with 20 months of stability at 4 mm.  No further imaging recommended     Past Surgical History:  Procedure Laterality Date  . CARDIAC CATHETERIZATION  2005   Nonobstructive, <40%  . TONSILLECTOMY Bilateral 1961     Current Outpatient Prescriptions  Medication Sig Dispense Refill  . albuterol (PROAIR HFA) 108 (90 Base) MCG/ACT inhaler Inhale 1-2 puffs into the lungs every 6 (six) hours as needed for wheezing or shortness of breath. Use 15 minutes prior to activity 8.5 g 1  . aspirin 81 MG chewable tablet Chew 81 mg by mouth daily.    [name] atorvastatin (LIPITOR) 40 MG tablet Take 1 tablet (40 mg total) by mouth daily. 90 tablet 3  . gabapentin (NEURONTIN) 300 MG capsule Take 1 capsule (300 mg total) by mouth 2 (two) times daily. 180 capsule 3  . hydrOXYzine (ATARAX/VISTARIL) 25 MG tablet Take 25 mg by mouth at bedtime as needed (headaches).   2  . meclizine (ANTIVERT) 12.5 MG tablet Take 1 tablet (12.5 mg total) by mouth 3 (three) times daily as needed for dizziness. 30 tablet 0  . omeprazole (PRILOSEC) 20 MG capsule Take 1 capsule (20 mg total) by mouth daily. 90 capsule  1  . sildenafil (VIAGRA) 100 MG tablet Take 0.5-1 tablets (50-100 mg total) by mouth daily as needed for erectile dysfunction. 10 tablet 0  . SUMAtriptan (IMITREX) 100 MG tablet TAKE 1 TABLET BY MOUTH, MAY REPEAT ONCE IN 2 HOURS IF HEADACHE PERSISTS OR RECURS,DO NOT EXCEED 2 TABLETS IN 24 HOURS 10 tablet 5  . SUMAtriptan (IMITREX) 100 MG tablet TAKE 1 TABLET BY MOUTH, MAY REPEAT ONCE IN 2 HOURS IF HEADACHE PERSISTS OR RECURS,DO NOT EXCEED 2 TABLETS IN 24 HOURS 10 tablet 5  . tiZANidine (ZANAFLEX) 4 MG tablet TAKE 1 TABLET(4 MG) BY MOUTH AT BEDTIME AS NEEDED FOR MUSCLE SPASMS 30 tablet 0  . topiramate (TOPAMAX) 100 MG tablet Take 1 tablet (100 mg total) by mouth 2 (two) times daily. 60 tablet 5  . verapamil  (CALAN) 120 MG tablet TAKE 1 TABLET(120 MG) BY MOUTH THREE TIMES DAILY 270 tablet 0  . amLODipine (NORVASC) 10 MG tablet Take 1 tablet (10 mg total) by mouth daily. 30 tablet 5  . [DISCONTINUED] verapamil (CALAN) 120 MG tablet Take 120 mg by mouth 3 (three) times daily.      No current facility-administered medications for this visit.     Allergies:   Codeine phosphate and Penicillins    Social History:  The patient  reports that he quit smoking about 7 months ago. His smoking use included Cigars. He started smoking about 54 years ago. He has a 13.25 pack-year smoking history. He quit smokeless tobacco use about 3 weeks ago. He reports that he uses drugs, including Marijuana. He reports that he does not drink alcohol.   Family History:  The patient's family history includes Anemia in his mother; Diabetes in his daughter; Hypertension in his daughter; Migraines in his daughter.    ROS:  Please see the history of present illness.   Otherwise, review of systems are positive for URI.   All other systems are reviewed and negative.    PHYSICAL EXAM: VS:  BP 117/79   Pulse 73   Ht 5\' 6"  (1.676 m)   Wt 81.3 kg (179 lb 3.2 oz)   SpO2 97%   BMI 28.92 kg/m  , BMI Body mass index is 28.92 kg/m. GENERAL:  Well appearing.  No acute distress HEENT:  Pupils equal round and reactive, fundi not visualized, oral mucosa unremarkable NECK:  No jugular venous distention, waveform within normal limits, carotid upstroke brisk and symmetric, no bruits LUNGS:  Clear to auscultation bilaterally.  No crackles, wheezes or rhonchi.  HEART:  RRR.  PMI not displaced or sustained,S1 and S2 within normal limits, no S3, no S4, no clicks, no rubs, no murmurs ABD:  Flat, positive bowel sounds normal in frequency in pitch, no bruits, no rebound, no guarding, no midline pulsatile mass, no hepatomegaly, no splenomegaly EXT:  2 plus pulses throughout, no edema, no cyanosis no clubbing SKIN:  No rashes no nodules.  No hair  on the lower legs from the calves to the ankles. NEURO:  Cranial nerves II through XII grossly intact, motor grossly intact throughout PSYCH:  Cognitively intact, oriented to person place and time   EKG:  EKG is ordered today. The ekg ordered 04/30/15 demonstrates sinus bradycardia at 59 bpm. 03/30/12: Sinus rhythm. Rate 73 bpm. PVCs.   TTE 12/03/07: LVEF 55-65%.  Trace MR.  LHC 07/03/04: 40% mid LAD, 30% mid LCx, 40% mid RCA, 20% distal RCA.  LV gram LVEF 50%.  Exercise Myoview 12/03/07: Exercised 7 minutes on Bruce protocol.  8 METS.  Adequate HR response.  No infarct or inducible ischemia.  Recent Labs: 06/23/2016: ALT 10 03/03/2017: BUN 11; Creatinine, Ser 1.52; Potassium 3.5; Sodium 140 03/11/2017: Hemoglobin 14.3; Platelets 230; TSH 4.280    Lipid Panel    Component Value Date/Time   CHOL 114 01/07/2017 1027   TRIG 269 (H) 01/07/2017 1027   HDL 27 (L) 01/07/2017 1027   CHOLHDL 4.2 01/07/2017 1027   CHOLHDL 3.8 07/16/2015 1522   VLDL 37 (H) 07/16/2015 1522   LDLCALC 33 01/07/2017 1027   LDLDIRECT 156 (H) 06/09/2011 1555      Wt Readings from Last 3 Encounters:  03/30/17 81.3 kg (179 lb 3.2 oz)  03/20/17 76.7 kg (169 lb)  03/11/17 80.4 kg (177 lb 3.2 oz)      Other studies Reviewed: Additional studies/ records that were reviewed today include: . Review of the above records demonstrates:  Please see elsewhere in the note.    ASSESSMENT AND PLAN:  # PAD: Mr. Dilorenzo has mild PAD.  His leg pain is likely 2/2 sciatica.  Continue aspirin and atorvastatin.   # HTN: BP well-controlled.  However his renal function is worsening.  Stop HCTZ/lisinopril and start amlodipine. Repeat BMP in 2 weeks.  # Hyperlipidemia: LDL 33 12/2016. Continue atorvastatin.  Triglycerides are elevated but not enough for adding another agent.   # CV Disease prevention: Continue ASA 81mg  daily  # Tobacco cessation: Patient quit smoking 05/17/15.     Current medicines are reviewed at length with the  patient today.  The patient does not have concerns regarding medicines.  The following changes have been made:  no change  Labs/ tests ordered today include: Exercise ABIs   Orders Placed This Encounter  Procedures  . Basic metabolic panel     Disposition:   FU with Dr. Jonelle Sidle C. Oval Linsey in 1 year. APP in 2 weeks.    Signed, Jaime Latch, MD  03/30/2017 11:42 AM    [place]

## 2017-03-30 ENCOUNTER — Other Ambulatory Visit: Payer: Self-pay | Admitting: Family Medicine

## 2017-03-30 ENCOUNTER — Ambulatory Visit (INDEPENDENT_AMBULATORY_CARE_PROVIDER_SITE_OTHER): Payer: Medicare Other | Admitting: Cardiovascular Disease

## 2017-03-30 VITALS — BP 117/79 | HR 73 | Ht 66.0 in | Wt 179.2 lb

## 2017-03-30 DIAGNOSIS — N183 Chronic kidney disease, stage 3 unspecified: Secondary | ICD-10-CM

## 2017-03-30 DIAGNOSIS — E7800 Pure hypercholesterolemia, unspecified: Secondary | ICD-10-CM

## 2017-03-30 DIAGNOSIS — Z79899 Other long term (current) drug therapy: Secondary | ICD-10-CM

## 2017-03-30 DIAGNOSIS — I10 Essential (primary) hypertension: Secondary | ICD-10-CM

## 2017-03-30 DIAGNOSIS — I739 Peripheral vascular disease, unspecified: Secondary | ICD-10-CM

## 2017-03-30 MED ORDER — AMLODIPINE BESYLATE 10 MG PO TABS: 10.0000 mg | ORAL_TABLET | Freq: Every day | ORAL | 5 refills | Status: DC

## 2017-03-30 NOTE — Addendum Note (Signed)
Addended by: Alvina Filbert B on: 03/30/2017 05:18 PM   Modules accepted: Orders

## 2017-03-30 NOTE — Telephone Encounter (Signed)
In person  to request refill of:  Name of Medication(s):  Lisinopril Last date of OV:  03/20/17 [facility]acy:  [name] [address].  Will route refill request to Clinic RN.  Discussed with patient policy to call pharmacy for future refills.  Also, discussed refills may take up to 48 hours to approve or deny.  Jaime Barnes

## 2017-03-30 NOTE — Patient Instructions (Addendum)
Medication Instructions:  STOP LISINOPRIL-HCT   START AMLODIPINE 10 MG DAILY   Labwork: BMET AT OFFICE A COUPLE OF DAYS PRIOR TO YOUR FOLLOW UP APPOINTMENT  Testing/Procedures: NONE  Follow-Up: Your physician recommends that you schedule a follow-up appointment in: [place] physician wants you to follow-up in: [place] will receive a reminder letter in the mail two months in advance. If you don't receive a letter, please call our office to schedule the follow-up appointment.  Any Other Special Instructions Will Be Listed Below (If Applicable).     If you need a refill on your cardiac medications before your next appointment, please call your pharmacy.

## 2017-03-31 ENCOUNTER — Telehealth: Payer: Self-pay | Admitting: *Deleted

## 2017-03-31 NOTE — Telephone Encounter (Signed)
Dr. Criss Rosales requested me to call and inform pt that the reason that the requested Rx was refused was due to the Cardiologist telling pt to discontinue this RX.  Pt was informed.  Routing to PCP as an Pharmacist, hospital. Katharina Caper, April D, [place]

## 2017-04-09 ENCOUNTER — Ambulatory Visit (INDEPENDENT_AMBULATORY_CARE_PROVIDER_SITE_OTHER): Payer: Medicare Other | Admitting: Family Medicine

## 2017-04-09 ENCOUNTER — Other Ambulatory Visit: Payer: Self-pay | Admitting: Family Medicine

## 2017-04-09 ENCOUNTER — Encounter: Payer: Self-pay | Admitting: Family Medicine

## 2017-04-09 VITALS — BP 112/62 | HR 66 | Temp 97.6°F | Ht 66.0 in | Wt 174.4 lb

## 2017-04-09 DIAGNOSIS — M5441 Lumbago with sciatica, right side: Secondary | ICD-10-CM | POA: Diagnosis not present

## 2017-04-09 DIAGNOSIS — G8929 Other chronic pain: Secondary | ICD-10-CM

## 2017-04-09 DIAGNOSIS — N529 Male erectile dysfunction, unspecified: Secondary | ICD-10-CM | POA: Diagnosis not present

## 2017-04-09 DIAGNOSIS — M792 Neuralgia and neuritis, unspecified: Secondary | ICD-10-CM

## 2017-04-09 MED ORDER — GABAPENTIN 300 MG PO CAPS: 300.0000 mg | ORAL_CAPSULE | Freq: Two times a day (BID) | ORAL | 3 refills | Status: DC

## 2017-04-09 MED ORDER — SILDENAFIL CITRATE 25 MG PO TABS: 50.0000 mg | ORAL_TABLET | Freq: Every day | ORAL | 0 refills | Status: DC | PRN

## 2017-04-09 MED ORDER — TIZANIDINE HCL 4 MG PO TABS: ORAL_TABLET | ORAL | 0 refills | Status: DC

## 2017-04-09 NOTE — Progress Notes (Signed)
It was a pleasure to see you today! Thank you for choosing [facility] for your primary care. Jaime Barnes was seen for refill of your long term gabapentin/tizanidine and reconciliation for a new sildenafil medication. Come back to the clinic if you have any question or concerns and go to the emergency room if chest pain or palpitations/dizziness/or low blood pressure with your new lower sildenafil dose.   You should only be taking a maximum of 50mg  per sexual encounter app. 1 hr prior.  We discussed a plan to quit smoking cigars and eat a red hot candy instead.  You can do it!  Please schedule an appt. In 1 month to discuss how your new sildenfil amounts are working.   Best,  Sherene Sires, DO FAMILY MEDICINE RESIDENT - PGY1 04/09/2017 2:52 PM

## 2017-04-09 NOTE — Assessment & Plan Note (Signed)
Wants refill on tizanidine and gabapentin.

## 2017-04-09 NOTE — Assessment & Plan Note (Signed)
Patient was prescribe 100mg  pills although they say they were given 20mg  and were taking 3.

## 2017-04-09 NOTE — Patient Instructions (Signed)
It was a pleasure to see you today! Thank you for choosing [facility] for your primary care. Jaime Barnes was seen for refill of your long term gabapentin/tizanidine and reconciliation for a new sildenafil medication. Come back to the clinic if you have any question or concerns and go to the emergency room if chest pain or palpitations/dizziness/or low blood pressure with your new lower sildenafil dose.   You should only be taking a maximum of 50mg  per sexual encounter app. 1 hr prior.  We discussed a plan to quit smoking cigars and eat a red hot candy instead.  You can do it!  Please schedule an appt. In 1 month to discuss how your new sildenfil amounts are working.   Best,  Sherene Sires, DO FAMILY MEDICINE RESIDENT - PGY1 04/09/2017 2:52 PM

## 2017-04-10 ENCOUNTER — Telehealth: Payer: Self-pay | Admitting: *Deleted

## 2017-04-10 NOTE — Telephone Encounter (Signed)
Prior Authorization received from [place] for Sildenafil 25 mg.  PA form placed in provider box for completion. Derl Barrow, RN

## 2017-04-10 NOTE — Telephone Encounter (Signed)
Please send sildenafil RX to [facility]

## 2017-04-13 NOTE — Progress Notes (Deleted)
Cardiology Office Note    Date:  04/13/2017   ID:  Jaime Barnes, DOB Dec 11, 1950, MRN [id number]  PCP:  Sherene Sires, DO  Cardiologist:  Dr. Oval Linsey  No chief complaint on file.   History of Present Illness:  Jaime Barnes is a 66 y.o. male with PMH of HTN, HLD, Nonobstructive CAD, moderate PAD, CKD, cirrhosis, Barrett's esophagus and history of pancreatitis. Cardiac catheterization updated in October 2005 showed 40% LAD, 20-40% RCA, 30% left circumflex disease, EF 50%. He had a negative Myoview on 12/03/2007. ABI obtained in 2016 was negative for occlusive disease. He was referred to cardiology service due to the suspicion that this was false negative. He was referred for exercise ABIs 06/11/2015 which showed 30-49% SFA disease without focal stenosis. His leg pain was felt to be secondary to sciatica. His most recent office visit was on 03/30/2017, his renal function was worsening, therefore lisinopril was-HCT was discontinued and he was started on 10 mg daily of amlodipine. He was supposed to obtain basic metabolic panel prior to today's visit, however I did not see this.  No EKG BMET    Past Medical History:  Diagnosis Date  . Barrett's esophagus 2011   Recommend repeat EGD for surveillance in 2 yrs-[facility]  . Bipolar disorder ([place])   . Chronic kidney disease   . Cirrhosis ([place])   . Diverticulosis of colon   . GANGLION CYST, TENDON SHEATH 06/26/2010  . Hyperlipidemia   . Hypertension   . Impotence   . Migraine   . Pancreatitis   . Pulmonary nodule   . Pulmonary nodule seen on imaging study 02/06/2011   Followup CT in December 2014 with 20 months of stability at 4 mm.  No further imaging recommended     Past Surgical History:  Procedure Laterality Date  . CARDIAC CATHETERIZATION  2005   Nonobstructive, <40%  . TONSILLECTOMY Bilateral 1961    Current Medications: Outpatient Medications Prior to Visit  Medication Sig Dispense Refill  . albuterol (PROAIR HFA) 108 (90 Base)  MCG/ACT inhaler Inhale 1-2 puffs into the lungs every 6 (six) hours as needed for wheezing or shortness of breath. Use 15 minutes prior to activity 8.5 g 1  . amLODipine (NORVASC) 10 MG tablet Take 1 tablet (10 mg total) by mouth daily. 30 tablet 5  . aspirin 81 MG chewable tablet Chew 81 mg by mouth daily.    [name] atorvastatin (LIPITOR) 40 MG tablet Take 1 tablet (40 mg total) by mouth daily. 90 tablet 3  . gabapentin (NEURONTIN) 300 MG capsule Take 1 capsule (300 mg total) by mouth 2 (two) times daily. 180 capsule 3  . hydrOXYzine (ATARAX/VISTARIL) 25 MG tablet Take 25 mg by mouth at bedtime as needed (headaches).   2  . meclizine (ANTIVERT) 12.5 MG tablet Take 1 tablet (12.5 mg total) by mouth 3 (three) times daily as needed for dizziness. 30 tablet 0  . omeprazole (PRILOSEC) 20 MG capsule Take 1 capsule (20 mg total) by mouth daily. 90 capsule 1  . sildenafil (VIAGRA) 25 MG tablet Take 2 tablets (50 mg total) by mouth daily as needed for erectile dysfunction. 30 tablet 0  . SUMAtriptan (IMITREX) 100 MG tablet TAKE 1 TABLET BY MOUTH, MAY REPEAT ONCE IN 2 HOURS IF HEADACHE PERSISTS OR RECURS,DO NOT EXCEED 2 TABLETS IN 24 HOURS 10 tablet 5  . SUMAtriptan (IMITREX) 100 MG tablet TAKE 1 TABLET BY MOUTH, MAY REPEAT ONCE IN 2 HOURS IF HEADACHE PERSISTS OR RECURS,DO NOT  EXCEED 2 TABLETS IN 24 HOURS 10 tablet 5  . tiZANidine (ZANAFLEX) 4 MG tablet TAKE 1 TABLET(4 MG) BY MOUTH AT BEDTIME AS NEEDED FOR MUSCLE SPASMS 30 tablet 0  . topiramate (TOPAMAX) 100 MG tablet Take 1 tablet (100 mg total) by mouth 2 (two) times daily. 60 tablet 5  . verapamil (CALAN) 120 MG tablet TAKE 1 TABLET(120 MG) BY MOUTH THREE TIMES DAILY 270 tablet 0   No facility-administered medications prior to visit.      Allergies:   Codeine phosphate and Penicillins   Social History   Social History  . Marital status: Legally Separated    Spouse name: N/A  . Number of children: N/A  . Years of education: N/A   Social History Main  Topics  . Smoking status: Former Smoker    Packs/day: 0.25    Years: 53.00    Types: Cigars    Start date: 09/29/1962    Quit date: 08/04/2016  . Smokeless tobacco: Former Systems developer    Quit date: 03/08/2017     Comment: 2 a day  . Alcohol use No     Comment: Last drink "30 years ago"  . Drug use: Yes    Types: Marijuana     Comment: daily marijuana since teenager.   [name] Sexual activity: Yes    Birth control/ protection: Condom     Comment: Multiple sex partners. 60 in last year.   Other Topics Concern  . Not on file   Social History Narrative   Pt lives alone. No stairs in home. Pt has completed 8th grade.      Family History:  The patient's ***family history includes Anemia in his mother; Diabetes in his daughter; Hypertension in his daughter; Migraines in his daughter.   ROS:   Please see the history of present illness.    ROS All other systems reviewed and are negative.   PHYSICAL EXAM:   VS:  There were no vitals taken for this visit.   GEN: Well nourished, well developed, in no acute distress  HEENT: normal  Neck: no JVD, carotid bruits, or masses Cardiac: ***RRR; no murmurs, rubs, or gallops,no edema  Respiratory:  clear to auscultation bilaterally, normal work of breathing GI: soft, nontender, nondistended, + BS MS: no deformity or atrophy  Skin: warm and dry, no rash Neuro:  Alert and Oriented x 3, Strength and sensation are intact Psych: euthymic mood, full affect  Wt Readings from Last 3 Encounters:  04/09/17 174 lb 6.4 oz (79.1 kg)  03/30/17 179 lb 3.2 oz (81.3 kg)  03/20/17 169 lb (76.7 kg)      Studies/Labs Reviewed:   EKG:  EKG is*** ordered today.  The ekg ordered today demonstrates ***  Recent Labs: 06/23/2016: ALT 10 03/03/2017: BUN 11; Creatinine, Ser 1.52; Potassium 3.5; Sodium 140 03/11/2017: Hemoglobin 14.3; Platelets 230; TSH 4.280   Lipid Panel    Component Value Date/Time   CHOL 114 01/07/2017 1027   TRIG 269 (H) 01/07/2017 1027   HDL 27 (L)  01/07/2017 1027   CHOLHDL 4.2 01/07/2017 1027   CHOLHDL 3.8 07/16/2015 1522   VLDL 37 (H) 07/16/2015 1522   LDLCALC 33 01/07/2017 1027   LDLDIRECT 156 (H) 06/09/2011 1555    Additional studies/ records that were reviewed today include:   Echo 04/23/2015 LV EF: 55% -   60%  Study Conclusions  - Left ventricle: The cavity size was normal. Wall thickness was   increased in a pattern of mild LVH.  Systolic function was normal.   The estimated ejection fraction was in the range of 55% to 60%.   Wall motion was normal; there were no regional wall motion   abnormalities. Features are consistent with a pseudonormal left   ventricular filling pattern, with concomitant abnormal relaxation   and increased filling pressure (grade 2 diastolic dysfunction). - Aortic valve: There was no stenosis. - Mitral valve: Mildly calcified annulus. Normal thickness leaflets   . There was trivial regurgitation. - Left atrium: The atrium was moderately dilated. - Right ventricle: The cavity size was normal. Systolic function   was normal. - Right atrium: The atrium was mildly dilated. - Tricuspid valve: Peak RV-RA gradient (S): 34 mm Hg. - Pulmonary arteries: PA peak pressure: 37 mm Hg (S). - Inferior vena cava: The vessel was normal in size. The   respirophasic diameter changes were in the normal range (= 50%),   consistent with normal central venous pressure.  Impressions:  - Normal LV size with mild LV hypertrophy, EF 55-60%. Moderate   diastolic dysfunction. Normal RV size and systolic function. No   significant valvular abnormalities. Biatrial enlargement. Mild   pulmonary hypertension.    LE arterial doppler 06/11/2015   ASSESSMENT:    No diagnosis found.   PLAN:  In order of problems listed above:  1. ***    Medication Adjustments/Labs and Tests Ordered: Current medicines are reviewed at length with the patient today.  Concerns regarding medicines are outlined above.   Medication changes, Labs and Tests ordered today are listed in the Patient Instructions below. There are no Patient Instructions on file for this visit.   Hilbert Corrigan, [place]  04/13/2017 9:10 PM    [facility] [place] [place], [place], [place]  [address] Phone: [phone]; Fax: [phone]

## 2017-04-14 ENCOUNTER — Ambulatory Visit: Payer: Medicare Other | Admitting: Physician Assistant

## 2017-04-14 NOTE — Telephone Encounter (Signed)
Provider completed form and faxed back to appropriate number.  Placed on RN triage desk. Davyn Elsasser, Salome Spotted, CMA

## 2017-04-20 NOTE — Telephone Encounter (Signed)
PA for sildenafil was denied via [employer].  Reference number: [id number].  Derl Barrow, RN

## 2017-04-20 NOTE — Progress Notes (Signed)
HPI: Patient comes in mainly for establishing care and med refill.   He is specifically enthusiastic about a recent sildenafil medication that he says has alleviated his erectile dysfunction but has been causing diarhea each morning after use.  He wants to know if there is another option to sildenafil.   He says he has been taking 3 pills per sexual encounter but seems unsure if the pills are 20mg  or 100mg  each due to some confusion with the pharmacy.  He says the sildenafil at his current doses works extremely well for erectile dysfunction and he says he has no chest pain/dizziness/visual changes with current use.  Physical: Gen: well appearing cheerful and communicative appears to be healthy weight Card: RRR with no murmurs noted Pulm: lungs CTA with no wheezes noted, no visible increased effort of breathing Gastro:  No belly pain, just diarrhea after sildenafil use which resolves ~12 hours  Plan: Refill gabapentin/tizanidine  Called pharmacy and changed sildenafil to 25mg , told [place] to only take 1 or 2 pills.   Clear education was given to both Cordarryl and his partner about the dangers of taking more than 50mg  due to his age and that he should never take more than 50mg  total at a time.   He is going to come back in one month to discuss how this adjusted regimen works for him.   Sherene Sires, DO FAMILY MEDICINE RESIDENT - PGY1 04/20/2017 4:12 PM

## 2017-04-24 ENCOUNTER — Encounter (HOSPITAL_BASED_OUTPATIENT_CLINIC_OR_DEPARTMENT_OTHER): Payer: Medicare Other

## 2017-04-28 ENCOUNTER — Encounter (HOSPITAL_BASED_OUTPATIENT_CLINIC_OR_DEPARTMENT_OTHER): Payer: Medicare Other

## 2017-04-29 ENCOUNTER — Other Ambulatory Visit: Payer: Self-pay | Admitting: Family Medicine

## 2017-04-29 ENCOUNTER — Encounter: Payer: Self-pay | Admitting: Physician Assistant

## 2017-04-29 ENCOUNTER — Ambulatory Visit (INDEPENDENT_AMBULATORY_CARE_PROVIDER_SITE_OTHER): Payer: Medicare Other | Admitting: Physician Assistant

## 2017-04-29 VITALS — BP 128/78 | HR 74 | Ht 66.0 in | Wt 172.0 lb

## 2017-04-29 DIAGNOSIS — N289 Disorder of kidney and ureter, unspecified: Secondary | ICD-10-CM | POA: Diagnosis not present

## 2017-04-29 DIAGNOSIS — I10 Essential (primary) hypertension: Secondary | ICD-10-CM | POA: Diagnosis not present

## 2017-04-29 DIAGNOSIS — I739 Peripheral vascular disease, unspecified: Secondary | ICD-10-CM

## 2017-04-29 DIAGNOSIS — I2510 Atherosclerotic heart disease of native coronary artery without angina pectoris: Secondary | ICD-10-CM | POA: Diagnosis not present

## 2017-04-29 DIAGNOSIS — E785 Hyperlipidemia, unspecified: Secondary | ICD-10-CM | POA: Diagnosis not present

## 2017-04-29 DIAGNOSIS — Z79899 Other long term (current) drug therapy: Secondary | ICD-10-CM | POA: Diagnosis not present

## 2017-04-29 LAB — BASIC METABOLIC PANEL
BUN / CREAT RATIO: 12 (ref 10–24)
BUN: 14 mg/dL (ref 8–27)
CALCIUM: 9.9 mg/dL (ref 8.6–10.2)
CHLORIDE: 103 mmol/L (ref 96–106)
CO2: 19 mmol/L — ABNORMAL LOW (ref 20–29)
Creatinine, Ser: 1.18 mg/dL (ref 0.76–1.27)
GFR calc Af Amer: 74 mL/min/{1.73_m2} (ref >59)
GFR calc non Af Amer: 64 mL/min/{1.73_m2} (ref >59)
GLUCOSE: 85 mg/dL (ref 65–99)
POTASSIUM: 4.2 mmol/L (ref 3.5–5.2)
Sodium: 141 mmol/L (ref 134–144)

## 2017-04-29 NOTE — Telephone Encounter (Signed)
Pt calling to request refill of:  Name of Medication(s):  provential inhaler  Last date of OV:  04/10/17 Pharmacy: [facility]   Will route refill request to Clinic RN.  Discussed with patient policy to call pharmacy for future refills.  Also, discussed refills may take up to 48 hours to approve or deny.  Roseanna Rainbow

## 2017-04-29 NOTE — Patient Instructions (Signed)
Medication Instructions:   No changes  Labwork:   BMET today  Testing/Procedures:  None ordered  Follow-Up:  1 year with Dr. Oval Linsey  If you need a refill on your cardiac medications before your next appointment, please call your pharmacy.

## 2017-04-29 NOTE — Progress Notes (Signed)
Cardiology Office Note    Date:  04/30/2017   ID:  Jaime Barnes, DOB 10-22-1950, MRN [id number]  PCP:  Jaime Sires, DO  Cardiologist:  Dr. Oval Linsey  Chief Complaint  Patient presents with  . Follow-up    seen for Dr. Oval Linsey    History of Present Illness:  Jaime Barnes is a 66 y.o. male with HTN, HLD, nonobstructive CAD, and moderate PAD. He previously had a left heart cath in October 2005 that revealed 40% LAD, 20% to 40% RCA, 30% left circumflex disease, EF 50%. He was evaluated by his PCP in August 2016 with symptoms concerning for claudication. He had ABI testing that was negative for occlusive disease. He was referred to cardiology due to suspicion of false negative, he was referred for exercise ABIs that showed 30-49% SFA disease without focal stenosis. He was last seen by Dr. Oval Linsey on 03/30/2017, due to worsening renal function, he is HCTZ/lisinopril was discontinued and he was started on 10 mg daily of amlodipine.  He presents today for follow-up. Interestingly, despite discontinuation of lisinopril/HCTZ, he says he actually have slightly more urination on the new amlodipine. Otherwise his blood pressure has been doing very well. He denies any significant shortness of breath or chest discomfort. He has occasional leg cramps with walking, however this appears to be quite stable at this time. He can follow-up in one year. I will obtain a basic metabolic panel as I think his renal function actually has improved. It appears his primary care provider has referred him to a nephrologist.   Past Medical History:  Diagnosis Date  . Barrett's esophagus 2011   Recommend repeat EGD for surveillance in 2 yrs-[facility]  . Bipolar disorder ([place])   . Chronic kidney disease   . Cirrhosis ([place])   . Diverticulosis of colon   . GANGLION CYST, TENDON SHEATH 06/26/2010  . Hyperlipidemia   . Hypertension   . Impotence   . Migraine   . Pancreatitis   . Pulmonary nodule   . Pulmonary nodule seen  on imaging study 02/06/2011   Followup CT in December 2014 with 20 months of stability at 4 mm.  No further imaging recommended     Past Surgical History:  Procedure Laterality Date  . CARDIAC CATHETERIZATION  2005   Nonobstructive, <40%  . TONSILLECTOMY Bilateral 1961    Current Medications: Outpatient Medications Prior to Visit  Medication Sig Dispense Refill  . amLODipine (NORVASC) 10 MG tablet Take 1 tablet (10 mg total) by mouth daily. 30 tablet 5  . aspirin 81 MG chewable tablet Chew 81 mg by mouth daily.    [name] atorvastatin (LIPITOR) 40 MG tablet Take 1 tablet (40 mg total) by mouth daily. 90 tablet 3  . gabapentin (NEURONTIN) 300 MG capsule Take 1 capsule (300 mg total) by mouth 2 (two) times daily. 180 capsule 3  . hydrOXYzine (ATARAX/VISTARIL) 25 MG tablet Take 25 mg by mouth at bedtime as needed (headaches).   2  . meclizine (ANTIVERT) 12.5 MG tablet Take 1 tablet (12.5 mg total) by mouth 3 (three) times daily as needed for dizziness. 30 tablet 0  . omeprazole (PRILOSEC) 20 MG capsule Take 1 capsule (20 mg total) by mouth daily. 90 capsule 1  . sildenafil (VIAGRA) 25 MG tablet Take 2 tablets (50 mg total) by mouth daily as needed for erectile dysfunction. 30 tablet 0  . SUMAtriptan (IMITREX) 100 MG tablet TAKE 1 TABLET BY MOUTH, MAY REPEAT ONCE IN 2 HOURS IF HEADACHE  PERSISTS OR RECURS,DO NOT EXCEED 2 TABLETS IN 24 HOURS 10 tablet 5  . SUMAtriptan (IMITREX) 100 MG tablet TAKE 1 TABLET BY MOUTH, MAY REPEAT ONCE IN 2 HOURS IF HEADACHE PERSISTS OR RECURS,DO NOT EXCEED 2 TABLETS IN 24 HOURS 10 tablet 5  . tiZANidine (ZANAFLEX) 4 MG tablet TAKE 1 TABLET(4 MG) BY MOUTH AT BEDTIME AS NEEDED FOR MUSCLE SPASMS 30 tablet 0  . topiramate (TOPAMAX) 100 MG tablet Take 1 tablet (100 mg total) by mouth 2 (two) times daily. 60 tablet 5  . verapamil (CALAN) 120 MG tablet TAKE 1 TABLET(120 MG) BY MOUTH THREE TIMES DAILY 270 tablet 0  . albuterol (PROAIR HFA) 108 (90 Base) MCG/ACT inhaler Inhale 1-2  puffs into the lungs every 6 (six) hours as needed for wheezing or shortness of breath. Use 15 minutes prior to activity 8.5 g 1   No facility-administered medications prior to visit.      Allergies:   Codeine phosphate and Penicillins   Social History   Social History  . Marital status: Legally Separated    Spouse name: N/A  . Number of children: N/A  . Years of education: N/A   Social History Main Topics  . Smoking status: Former Smoker    Packs/day: 0.25    Years: 53.00    Types: Cigars    Start date: 09/29/1962    Quit date: 08/04/2016  . Smokeless tobacco: Former Systems developer    Quit date: 03/08/2017     Comment: 2 a day  . Alcohol use No     Comment: Last drink "30 years ago"  . Drug use: Yes    Types: Marijuana     Comment: daily marijuana since teenager.   [name] Sexual activity: Yes    Birth control/ protection: Condom     Comment: Multiple sex partners. 60 in last year.   Other Topics Concern  . None   Social History Narrative   Pt lives alone. No stairs in home. Pt has completed 8th grade.      Family History:  The patient's family history includes Anemia in his mother; Diabetes in his daughter; Hypertension in his daughter; Migraines in his daughter.   ROS:   Please see the history of present illness.    ROS All other systems reviewed and are negative.   PHYSICAL EXAM:   VS:  BP 128/78   Pulse 74   Ht 5\' 6"  (1.676 m)   Wt 172 lb (78 kg)   SpO2 96%   BMI 27.76 kg/m    GEN: Well nourished, well developed, in no acute distress  HEENT: normal  Neck: no JVD, carotid bruits, or masses Cardiac: RRR; no murmurs, rubs, or gallops,no edema  Respiratory:  clear to auscultation bilaterally, normal work of breathing GI: soft, nontender, nondistended, + BS MS: no deformity or atrophy  Skin: warm and dry, no rash Neuro:  Alert and Oriented x 3, Strength and sensation are intact Psych: euthymic mood, full affect  Wt Readings from Last 3 Encounters:  04/29/17 172 lb (78  kg)  04/09/17 174 lb 6.4 oz (79.1 kg)  03/30/17 179 lb 3.2 oz (81.3 kg)      Studies/Labs Reviewed:   EKG:  EKG is not ordered today.  Recent Labs: 06/23/2016: ALT 10 03/11/2017: Hemoglobin 14.3; Platelets 230; TSH 4.280 04/29/2017: BUN 14; Creatinine, Ser 1.18; Potassium 4.2; Sodium 141   Lipid Panel    Component Value Date/Time   CHOL 114 01/07/2017 1027   TRIG 269 (  H) 01/07/2017 1027   HDL 27 (L) 01/07/2017 1027   CHOLHDL 4.2 01/07/2017 1027   CHOLHDL 3.8 07/16/2015 1522   VLDL 37 (H) 07/16/2015 1522   LDLCALC 33 01/07/2017 1027   LDLDIRECT 156 (H) 06/09/2011 1555    Additional studies/ records that were reviewed today include:   Echo 12/03/2007   Echo 04/23/2015 LV EF: 55% -   60%  ------------------------------------------------------------------- Indications:      Syncope (R55).  ------------------------------------------------------------------- History:   PMH:  Dizziness, ETOH Abuse, Cirrhosis.  Syncope. Coronary artery disease.  Risk factors:  Current tobacco use. Hypertension. Dyslipidemia.  ------------------------------------------------------------------- Study Conclusions  - Left ventricle: The cavity size was normal. Wall thickness was   increased in a pattern of mild LVH. Systolic function was normal.   The estimated ejection fraction was in the range of 55% to 60%.   Wall motion was normal; there were no regional wall motion   abnormalities. Features are consistent with a pseudonormal left   ventricular filling pattern, with concomitant abnormal relaxation   and increased filling pressure (grade 2 diastolic dysfunction). - Aortic valve: There was no stenosis. - Mitral valve: Mildly calcified annulus. Normal thickness leaflets   . There was trivial regurgitation. - Left atrium: The atrium was moderately dilated. - Right ventricle: The cavity size was normal. Systolic function   was normal. - Right atrium: The atrium was mildly dilated. -  Tricuspid valve: Peak RV-RA gradient (S): 34 mm Hg. - Pulmonary arteries: PA peak pressure: 37 mm Hg (S). - Inferior vena cava: The vessel was normal in size. The   respirophasic diameter changes were in the normal range (= 50%),   consistent with normal central venous pressure.  Impressions:  - Normal LV size with mild LV hypertrophy, EF 55-60%. Moderate   diastolic dysfunction. Normal RV size and systolic function. No   significant valvular abnormalities. Biatrial enlargement. Mild   pulmonary hypertension.  ASSESSMENT:    1. Acute renal insufficiency   2. Encounter for long-term (current) use of medications   3. Coronary artery disease involving native coronary artery of native heart without angina pectoris   4. PAD (peripheral artery disease) ([place])   5. Essential hypertension   6. Hyperlipidemia, unspecified hyperlipidemia type      PLAN:  In order of problems listed above:  1. Acute renal insufficiency: Likely related to lisinopril-hydrochlorothiazide, this medication has been discontinued and the switched to amlodipine. We'll obtain a basic metabolic panel today. He has upcoming visit with nephrology service, will defer to primary care physician to decide if still need nephrology evaluation if renal function improve.  2. CAD: No chest discomfort. History of nonobstructive disease  3. PAD: Does have occasional leg cramps, however quite stable at this time  4. Hypertension: Blood pressure well controlled after switching from lisinopril-HCTZ to amlodipine  5. Hyperlipidemia: Continue on Lipitor    Medication Adjustments/Labs and Tests Ordered: Current medicines are reviewed at length with the patient today.  Concerns regarding medicines are outlined above.  Medication changes, Labs and Tests ordered today are listed in the Patient Instructions below. Patient Instructions  Medication Instructions:   No changes  Labwork:   BMET today  Testing/Procedures:  None  ordered  Follow-Up:  1 year with Dr. Oval Linsey  If you need a refill on your cardiac medications before your next appointment, please call your pharmacy.      Hilbert Corrigan, [place]  04/30/2017 11:35 PM    [facility] [address], [place], [place]  [address] Phone: [phone]; Fax: [phone]

## 2017-04-30 ENCOUNTER — Encounter: Payer: Self-pay | Admitting: Physician Assistant

## 2017-04-30 MED ORDER — ALBUTEROL SULFATE HFA 108 (90 BASE) MCG/ACT IN AERS: 1.0000 | INHALATION_SPRAY | Freq: Four times a day (QID) | RESPIRATORY_TRACT | 1 refills | Status: DC | PRN

## 2017-04-30 NOTE — Progress Notes (Signed)
Renal function improved as expected after discontinuation of lisinopril/HCTZ, report should be forwarded to primary care physician, he was referred to nephrology service, will defer to PCP to decide whether he still needs nephrology evaluation

## 2017-05-08 ENCOUNTER — Other Ambulatory Visit: Payer: Self-pay | Admitting: Family Medicine

## 2017-05-11 ENCOUNTER — Telehealth: Payer: Self-pay | Admitting: Family Medicine

## 2017-05-11 DIAGNOSIS — N529 Male erectile dysfunction, unspecified: Secondary | ICD-10-CM

## 2017-05-11 MED ORDER — SILDENAFIL CITRATE 25 MG PO TABS: 50.0000 mg | ORAL_TABLET | Freq: Every day | ORAL | 0 refills | Status: DC | PRN

## 2017-05-11 NOTE — Telephone Encounter (Signed)
Patient called and said he was checking on his Sildenafil that was supposed to be called in to [facility]. He stated his doctor was going to increase him to 50 mg. Patient requesting someone to call him to discuss RX.

## 2017-05-12 ENCOUNTER — Encounter: Payer: Self-pay | Admitting: Family Medicine

## 2017-05-12 ENCOUNTER — Ambulatory Visit (INDEPENDENT_AMBULATORY_CARE_PROVIDER_SITE_OTHER): Payer: Medicare Other | Admitting: Family Medicine

## 2017-05-12 DIAGNOSIS — F17200 Nicotine dependence, unspecified, uncomplicated: Secondary | ICD-10-CM | POA: Diagnosis not present

## 2017-05-12 DIAGNOSIS — R42 Dizziness and giddiness: Secondary | ICD-10-CM | POA: Diagnosis not present

## 2017-05-12 DIAGNOSIS — Z716 Tobacco abuse counseling: Secondary | ICD-10-CM | POA: Diagnosis not present

## 2017-05-12 DIAGNOSIS — Z720 Tobacco use: Secondary | ICD-10-CM | POA: Diagnosis not present

## 2017-05-12 DIAGNOSIS — N529 Male erectile dysfunction, unspecified: Secondary | ICD-10-CM

## 2017-05-12 MED ORDER — MECLIZINE HCL 12.5 MG PO TABS
12.5000 mg | ORAL_TABLET | Freq: Three times a day (TID) | ORAL | 0 refills | Status: DC | PRN
Start: 2017-05-12 — End: 2017-11-25

## 2017-05-12 NOTE — Progress Notes (Signed)
[employer] Voucher:  Previous assistance?  yes  Taxi, Bus pass, gas card, food, or other? Bus pass Med co-pay? no         [place]?  no Other:  no  Provider:  Sherene Sires Clinic Staff:  Nolene Ebbs, RN  Patient requesting bus pass for self and significant other.  Patient has routinely asked for bus passes for both when he comes for office visit with PCP.  Provided 2 bus passes at today's visit, but will not provide additional passes at future visits due to limited bus pass funding.  Burna Forts, BSN, RN-BC

## 2017-05-12 NOTE — Assessment & Plan Note (Signed)
Discussed continued decrease in smoking, patient is making progress

## 2017-05-12 NOTE — Progress Notes (Signed)
    Subjective:  Jaime Barnes is a 66 y.o. male who presents to the [facility] today with a chief complaint of wanting help getting medicare to cover his sildenafil.   HPI: Patient says the 50mg  sildenafil has been working well.   He has had no chest pain/headaches/diszziness with use and he says it has helped his erectile disfunction.   He got a letter saying that the medicine is not covered by medicare and wanted to see if there was a way to get that changed as he is on a fixed income.  He also asks for a refill of his chronic meclizine.   He has had no change in symptoms and says when he is not on meclizine he feels like his head spins if he bends over and stands up to fast.   He has had no visual changes or changes in severity of symptoms.  We also discussed his ongoing effort to decrease his smoking.   He says it has decreased and he does not need any medication to assist him at this time.  Objective:  Physical Exam: BP 122/60   Pulse (!) 58   Temp 97.8 F (36.6 C) (Oral)   Ht 5\' 6"  (1.676 m)   Wt 179 lb (81.2 kg)   SpO2 96%   BMI 28.89 kg/m   Gen: NAD, resting comfortably CV: RRR with no murmurs appreciated Pulm: NWOB, CTAB with no crackles, wheezes, or rhonchi GI: Soft, Nontender, Nondistended. MSK: no edema, cyanosis, or clubbing noted Skin: warm, dry Neuro: grossly normal, moves all extremities Psych: Normal affect and thought content  No results found for this or any previous visit (from the past 72 hour(s)).   Assessment/Plan:  Dizziness Refill meclizine  TOBACCO USER Discussed continued decrease in smoking, patient is making progress  IMPOTENCE, ORGANIC Discussed staying below 50mg  per dose, and using [employer] to search for best prices since this is a cash pay medicine   Sherene Sires, [place] - PGY1 05/12/2017 1:59 PM

## 2017-05-12 NOTE — Assessment & Plan Note (Signed)
Discussed staying below 50mg  per dose, and using [employer] to search for best prices since this is a cash pay medicine

## 2017-05-12 NOTE — Assessment & Plan Note (Signed)
Refill meclizine.

## 2017-05-12 NOTE — Patient Instructions (Signed)
Jaime Barnes,  We discussed your good work in decreasing your smoking, I'm happy you are doing so well and looking forward to a full quit soon!  We also discussed methods to find cheaper sildenafil since it is a cash pay medicine and that you need to stay below 50mg  per use.  Your meclizine has been refilled as well, please let us know if you experience any change in symptoms.  -Dr. Criss Rosales

## 2017-06-09 ENCOUNTER — Ambulatory Visit: Payer: Medicare Other | Admitting: Podiatry

## 2017-06-16 ENCOUNTER — Encounter: Payer: Self-pay | Admitting: Podiatry

## 2017-06-16 ENCOUNTER — Ambulatory Visit (INDEPENDENT_AMBULATORY_CARE_PROVIDER_SITE_OTHER): Payer: Medicare Other | Admitting: Podiatry

## 2017-06-16 DIAGNOSIS — B351 Tinea unguium: Secondary | ICD-10-CM | POA: Diagnosis not present

## 2017-06-16 DIAGNOSIS — M79676 Pain in unspecified toe(s): Secondary | ICD-10-CM

## 2017-06-16 NOTE — Progress Notes (Signed)
Patient ID: Jaime Barnes, male   DOB: 1951-08-11, 66 y.o.   MRN: [id number] Complaint:  Visit Type: Patient returns to my office for continued preventative foot care services. Complaint: Patient states" my nails have grown long and thick and become painful to walk and wear shoes" . The patient presents for preventative foot care services. No changes to ROS  Podiatric Exam: Vascular: dorsalis pedis and posterior tibial pulses are palpable bilateral. Capillary return is immediate. Temperature gradient is WNL. Skin turgor WNL  Sensorium: Normal Semmes Weinstein monofilament test. Normal tactile sensation bilaterally. Nail Exam: Pt has thick disfigured discolored nails with subungual debris noted bilateral entire nail hallux through fifth toenails Ulcer Exam: There is no evidence of ulcer or pre-ulcerative changes or infection. Orthopedic Exam: Muscle tone and strength are WNL. No limitations in general ROM. No crepitus or effusions noted. Foot type and digits show no abnormalities. Bony prominences are unremarkable. Skin: No Porokeratosis. No infection or ulcers  Diagnosis:  Onychomycosis, , Pain in right toe, pain in left toes  Treatment & Plan Procedures and Treatment: Consent by patient was obtained for treatment procedures. The patient understood the discussion of treatment and procedures well. All questions were answered thoroughly reviewed. Debridement of mycotic and hypertrophic toenails, 1 through 5 bilateral and clearing of subungual debris. No ulceration, no infection noted.   Return Visit-Office Procedure: Patient instructed to return to the office for a follow up visit 10 weeks  for continued evaluation and treatment.    Gardiner Barefoot DPM

## 2017-06-23 ENCOUNTER — Ambulatory Visit (HOSPITAL_BASED_OUTPATIENT_CLINIC_OR_DEPARTMENT_OTHER): Payer: Medicare Other | Attending: Family Medicine

## 2017-07-02 DIAGNOSIS — N183 Chronic kidney disease, stage 3 (moderate): Secondary | ICD-10-CM | POA: Diagnosis not present

## 2017-07-02 DIAGNOSIS — K2270 Barrett's esophagus without dysplasia: Secondary | ICD-10-CM | POA: Diagnosis not present

## 2017-07-02 DIAGNOSIS — I129 Hypertensive chronic kidney disease with stage 1 through stage 4 chronic kidney disease, or unspecified chronic kidney disease: Secondary | ICD-10-CM | POA: Diagnosis not present

## 2017-08-11 ENCOUNTER — Encounter: Payer: Self-pay | Admitting: Neurology

## 2017-08-11 ENCOUNTER — Ambulatory Visit (INDEPENDENT_AMBULATORY_CARE_PROVIDER_SITE_OTHER): Payer: Medicare Other | Admitting: Neurology

## 2017-08-11 VITALS — BP 138/92 | HR 64 | Ht 66.0 in | Wt 179.8 lb

## 2017-08-11 DIAGNOSIS — G43009 Migraine without aura, not intractable, without status migrainosus: Secondary | ICD-10-CM | POA: Diagnosis not present

## 2017-08-11 DIAGNOSIS — F17200 Nicotine dependence, unspecified, uncomplicated: Secondary | ICD-10-CM

## 2017-08-11 DIAGNOSIS — I10 Essential (primary) hypertension: Secondary | ICD-10-CM | POA: Diagnosis not present

## 2017-08-11 NOTE — Progress Notes (Signed)
NEUROLOGY FOLLOW UP OFFICE NOTE  Jaime Barnes [id number]  HISTORY OF PRESENT ILLNESS: Jaime Barnes is a 66 year old right-handed male with hypertension, Bipolar disorder, CKD, tobacco user, Barrett's esophagus and cirrhosis who follows up for migraine.   UPDATE: Improved. Intensity:  10/10 Duration:  Briefly (minutes) with sumatriptan Frequency:  Usually 2 to 3 days per month. Current NSAIDS:  none Current analgesics:  none Current triptans:  sumatriptan 100mg  Current anti-emetic:  none Current muscle relaxants:  tizanidine Current Antihypertensive medications:  Lisinopril 10mg , verapamil 120mg  three times daily Current Antidepressant medications:  none Current Anticonvulsant medications:  topiramate 100mg  twice daily, gabapentin 300mg  twice daily  Kidney function stable from 04/29/17:  BUN 14, Cr 1.18.    HISTORY: Onset:  Has remote history of migraines but they returned in 2016 after experiencing increased stress related to his ex-girlfriend. Location:  Right sided Quality:  pounding Initial Intensity:  "50"/10 Aura:  no Prodrome:  no Associated symptoms:  Photophobia, phonophobia.  No nausea or visual disturbance Initial Duration:  Until takes medication Initial Frequency:  every other day Triggers/exacerbating factors:  stress Relieving factors:  none Activity:  Cannot function   Past abortive medication:  Sumatriptan 50mg  (needed 3 tablets for it to work), ibuprofen Past preventative medication:  none Other past therapy:  none   CT of head from 07/19/15 was unremarkable.    MRI of brain from 12/04/14 showed global atrophy and mild small vessel ischemic changes. 06/23/16:  Sed rate 25  PAST MEDICAL HISTORY: Past Medical History:  Diagnosis Date  . Barrett's esophagus 2011   Recommend repeat EGD for surveillance in 2 yrs-[facility]  . Bipolar disorder ([place])   . Chronic kidney disease   . Cirrhosis ([place])   . Diverticulosis of colon   . GANGLION CYST, TENDON SHEATH  06/26/2010  . Hyperlipidemia   . Hypertension   . Impotence   . Migraine   . Pancreatitis   . Pulmonary nodule   . Pulmonary nodule seen on imaging study 02/06/2011   Followup CT in December 2014 with 20 months of stability at 4 mm.  No further imaging recommended     MEDICATIONS: Current Outpatient Medications on File Prior to Visit  Medication Sig Dispense Refill  . albuterol (PROAIR HFA) 108 (90 Base) MCG/ACT inhaler Inhale 1-2 puffs into the lungs every 6 (six) hours as needed for wheezing or shortness of breath. Use 15 minutes prior to activity 8.5 g 1  . amLODipine (NORVASC) 10 MG tablet Take 1 tablet (10 mg total) by mouth daily. 30 tablet 5  . aspirin 81 MG chewable tablet Chew 81 mg by mouth daily.    [name] atorvastatin (LIPITOR) 40 MG tablet Take 1 tablet (40 mg total) by mouth daily. 90 tablet 3  . gabapentin (NEURONTIN) 300 MG capsule Take 1 capsule (300 mg total) by mouth 2 (two) times daily. 180 capsule 3  . hydrOXYzine (ATARAX/VISTARIL) 25 MG tablet Take 25 mg by mouth at bedtime as needed (headaches).   2  . omeprazole (PRILOSEC) 20 MG capsule Take 1 capsule (20 mg total) by mouth daily. 90 capsule 1  . sildenafil (VIAGRA) 25 MG tablet Take 2 tablets (50 mg total) by mouth daily as needed for erectile dysfunction. (Patient not taking: Reported on 08/11/2017) 30 tablet 0  . SUMAtriptan (IMITREX) 100 MG tablet TAKE 1 TABLET BY MOUTH, MAY REPEAT ONCE IN 2 HOURS IF HEADACHE PERSISTS OR RECURS,DO NOT EXCEED 2 TABLETS IN 24 HOURS 10 tablet 5  .  SUMAtriptan (IMITREX) 100 MG tablet TAKE 1 TABLET BY MOUTH, MAY REPEAT ONCE IN 2 HOURS IF HEADACHE PERSISTS OR RECURS,DO NOT EXCEED 2 TABLETS IN 24 HOURS 10 tablet 5  . tiZANidine (ZANAFLEX) 4 MG tablet TAKE 1 TABLET(4 MG) BY MOUTH AT BEDTIME AS NEEDED FOR MUSCLE SPASMS 30 tablet 0  . topiramate (TOPAMAX) 100 MG tablet Take 1 tablet (100 mg total) by mouth 2 (two) times daily. 60 tablet 5  . verapamil (CALAN) 120 MG tablet TAKE 1 TABLET(120 MG)  BY MOUTH THREE TIMES DAILY 270 tablet 0  . [DISCONTINUED] verapamil (CALAN) 120 MG tablet Take 120 mg by mouth 3 (three) times daily.      No current facility-administered medications on file prior to visit.     ALLERGIES: Allergies  Allergen Reactions  . Codeine Phosphate Other (See Comments)    REACTION: breathing difficulty  . Penicillins Rash and Other (See Comments)    Break out in sweats/ Rash amoxicillin. Denies airway involvement Has patient had a PCN reaction causing immediate rash, facial/tongue/throat swelling, SOB or lightheadedness with hypotension: Yes Has patient had a PCN reaction causing severe rash involving mucus membranes or skin necrosis: Yes Has patient had a PCN reaction that required hospitalization No Has patient had a PCN reaction occurring within the last 10 years: No If all of the above answers are "NO", then may proceed with Cephalosporin u    FAMILY HISTORY: Family History  Problem Relation Age of Onset  . Anemia Mother   . Hypertension Daughter   . Diabetes Daughter   . Migraines Daughter     SOCIAL HISTORY: Social History   Socioeconomic History  . Marital status: Legally Separated    Spouse name: Not on file  . Number of children: Not on file  . Years of education: Not on file  . Highest education level: Not on file  Social Needs  . Financial resource strain: Not on file  . Food insecurity - worry: Not on file  . Food insecurity - inability: Not on file  . Transportation needs - medical: Not on file  . Transportation needs - non-medical: Not on file  Occupational History  . Not on file  Tobacco Use  . Smoking status: Light Tobacco Smoker    Packs/day: 0.25    Years: 53.00    Pack years: 13.25    Types: Cigars    Start date: 09/29/1962    Last attempt to quit: 08/04/2016    Years since quitting: 1.0  . Smokeless tobacco: Former Systems developer    Quit date: 03/08/2017  . Tobacco comment: 2 a day  Substance and Sexual Activity  . Alcohol use:  No    Comment: Last drink "30 years ago"  . 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   [name] Sexual activity: Yes    Birth control/protection: Condom    Comment: Multiple sex partners. 60 in last year.  Other Topics Concern  . Not on file  Social History Narrative   Pt lives alone. No stairs in home. Pt has completed 8th grade.     REVIEW OF SYSTEMS: Constitutional: No fevers, chills, or sweats, no generalized fatigue, change in appetite Eyes: No visual changes, double vision, eye pain Ear, nose and throat: No hearing loss, ear pain, nasal congestion, sore throat Cardiovascular: No chest pain, palpitations Respiratory:  No shortness of breath at rest or with exertion, wheezes GastrointestinaI: No nausea, vomiting, diarrhea, abdominal pain, fecal  incontinence Genitourinary:  Impotence Musculoskeletal:  No neck pain, back pain Integumentary: No rash, pruritus, skin lesions Neurological: as above Psychiatric: No depression, insomnia, anxiety Endocrine: No palpitations, fatigue, diaphoresis, mood swings, change in appetite, change in weight, increased thirst Hematologic/Lymphatic:  No purpura, petechiae. Allergic/Immunologic: no itchy/runny eyes, nasal congestion, recent allergic reactions, rashes  PHYSICAL EXAM: Vitals:   08/11/17 0847  BP: (!) 138/92  Pulse: 64  SpO2: 98%   General: No acute distress.  Patient appears well-groomed.  normal body habitus. Head:  Normocephalic/atraumatic Eyes:  Fundi examined but not visualized Neck: supple, no paraspinal tenderness, full range of motion Heart:  Regular rate and rhythm Lungs:  Clear to auscultation bilaterally Back: No paraspinal tenderness Neurological Exam: alert and oriented to person, place, and time. Attention span and concentration intact, recent and remote memory intact, fund of knowledge intact.  Speech fluent and not dysarthric, language intact.  CN II-XII intact. Bulk and tone normal,  muscle strength 5/5 throughout.  Sensation to light touch  intact.  Deep tendon reflexes 2+ throughout.  Finger to nose testing intact.  Gait normal, Romberg negative.  IMPRESSION: Migraine stable Tobacco use  PLAN: 1.  Topiramate 100mg  twice daily 2.  Sumatriptan as needed 3.  Smoking cessation instruction/counseling given:  counseled patient on the dangers of tobacco use, advised patient to stop smoking, and reviewed strategies to maximize success. 4.  Follow up in 8 months.  Jaime Clines, DO  CC:  Sherene Sires, DO

## 2017-08-11 NOTE — Patient Instructions (Signed)
1.  Continue topiramate 100mg  twice daily 2.  Continue using sumatriptan as needed when you get a headache 3.  Follow up in 8 months.

## 2017-08-18 ENCOUNTER — Other Ambulatory Visit: Payer: Self-pay

## 2017-08-18 ENCOUNTER — Ambulatory Visit (INDEPENDENT_AMBULATORY_CARE_PROVIDER_SITE_OTHER): Payer: Medicare Other | Admitting: Family Medicine

## 2017-08-18 ENCOUNTER — Encounter: Payer: Self-pay | Admitting: Family Medicine

## 2017-08-18 VITALS — BP 140/82 | HR 60 | Temp 98.6°F | Ht 66.0 in | Wt 177.8 lb

## 2017-08-18 DIAGNOSIS — G8929 Other chronic pain: Secondary | ICD-10-CM | POA: Diagnosis not present

## 2017-08-18 DIAGNOSIS — E785 Hyperlipidemia, unspecified: Secondary | ICD-10-CM

## 2017-08-18 DIAGNOSIS — M5416 Radiculopathy, lumbar region: Secondary | ICD-10-CM

## 2017-08-18 DIAGNOSIS — N529 Male erectile dysfunction, unspecified: Secondary | ICD-10-CM

## 2017-08-18 DIAGNOSIS — M792 Neuralgia and neuritis, unspecified: Secondary | ICD-10-CM

## 2017-08-18 DIAGNOSIS — M5441 Lumbago with sciatica, right side: Secondary | ICD-10-CM | POA: Diagnosis not present

## 2017-08-18 MED ORDER — GABAPENTIN 300 MG PO CAPS: 600.0000 mg | ORAL_CAPSULE | Freq: Three times a day (TID) | ORAL | 3 refills | Status: DC

## 2017-08-18 MED ORDER — VERAPAMIL HCL 120 MG PO TABS: ORAL_TABLET | ORAL | 0 refills | Status: DC

## 2017-08-18 MED ORDER — ALBUTEROL SULFATE HFA 108 (90 BASE) MCG/ACT IN AERS: 1.0000 | INHALATION_SPRAY | Freq: Four times a day (QID) | RESPIRATORY_TRACT | 1 refills | Status: DC | PRN

## 2017-08-18 MED ORDER — TIZANIDINE HCL 4 MG PO TABS: ORAL_TABLET | ORAL | 0 refills | Status: DC

## 2017-08-18 MED ORDER — SILDENAFIL CITRATE 25 MG PO TABS: 50.0000 mg | ORAL_TABLET | Freq: Every day | ORAL | 0 refills | Status: DC | PRN

## 2017-08-18 MED ORDER — TOPIRAMATE 100 MG PO TABS: 100.0000 mg | ORAL_TABLET | Freq: Two times a day (BID) | ORAL | 5 refills | Status: DC

## 2017-08-18 MED ORDER — ATORVASTATIN CALCIUM 40 MG PO TABS: 40.0000 mg | ORAL_TABLET | Freq: Every day | ORAL | 3 refills | Status: DC

## 2017-08-18 NOTE — Patient Instructions (Signed)
It was a pleasure to see you today! Thank you for choosing [facility] for your primary care. Raynald Rouillard was seen for back/hip pain and seasonal allergies. Come back to the clinic if your symptoms do not resolve over the next 2 months with physical therapy , and go to the emergency room if you have any life threatening symptoms.  Today we discussed, therapy/medication/imaging for your back and came up with a plan to eventually do imaging but to start with meds/therapy.  We also discussed that your running nose/cough is likely seasonal allergies and you can continue with your current over the counter allergy medications.   Please call us immediately if you get paralysis or numbness in your legs or start losing control of your bowel/bladder as that can be an immediate emergency.    If we did any lab work today, and the results require attention, either me or my nurse will get in touch with you. If everything is normal, you will get a letter in mail and a message via . If you don't hear from [place] in two weeks, please give [place] a call. Otherwise, we look forward to seeing you again at your next visit. If you have any questions or concerns before then, please call the clinic at [phone].  Please bring all your medications to every doctors visit  Sign up for My Chart to have easy access to your labs results, and communication with your Primary care physician.    Please check-out at the front desk before leaving the clinic.    Best,  Dr. Sherene Sires FAMILY MEDICINE RESIDENT - PGY1 08/18/2017 10:50 AM

## 2017-08-19 DIAGNOSIS — M5416 Radiculopathy, lumbar region: Secondary | ICD-10-CM | POA: Insufficient documentation

## 2017-08-19 NOTE — Assessment & Plan Note (Signed)
Increase gabapentin to 600TID, PT referral, offered repeat imaging (patient will consider)

## 2017-08-19 NOTE — Progress Notes (Signed)
    Subjective:  Jaime Barnes is a 66 y.o. male who presents to the [facility] today with a chief complaint of R sided back/hip pain.   He describes the pain as chronic for months now.   It is sharp and stabbing in nature and related to active flexion/twisiting of his lower back.    He says the pain effects his desire to do activities but not his abilty to do them if he decides to tolerate the pain.   It start in his R lower back and radiates along R buttock to later knee but does not continue distally.   He has not noticed any assymetric muscle weakness and claims no paralysis or loss of senstation.  He has full and normal motor control.  Objective:  Physical Exam: BP 140/82   Pulse 60   Temp 98.6 F (37 C) (Oral)   Ht 5\' 6"  (1.676 m)   Wt 177 lb 12.8 oz (80.6 kg)   SpO2 97%   BMI 28.70 kg/m   Gen: NAD, resting comfortably, uncomfortable with movement CV: RRR with no murmurs appreciated Pulm: NWOB, CTAB with no crackles, wheezes, or rhonchi GI: Normal bowel sounds present. Soft, Nontender, Nondistended. MSK: no edema, cyanosis, or clubbing noted.  No muscle wasting assymetrically, some tonicitiy in R lumbar but no pain over spinous process.  HE has pain and reluctance on flexion of lower back.   Able to stand independently on both legs without hip drop.  Skin: warm, dry Neuro: grossly normal, moves all extremities.  LE sensation intact bilaterally Psych: Normal affect and thought content  No results found for this or any previous visit (from the past 72 hour(s)).   Assessment/Plan:  Radiculopathy of lumbar region Increase gabapentin to 600TID, PT referral, offered repeat imaging (patient will consider)   Sherene Sires, [place] - PGY1 08/19/2017 4:19 PM

## 2017-08-25 ENCOUNTER — Ambulatory Visit: Payer: Medicare Other | Admitting: Podiatry

## 2017-08-31 ENCOUNTER — Telehealth: Payer: Self-pay | Admitting: Family Medicine

## 2017-08-31 ENCOUNTER — Encounter: Payer: Self-pay | Admitting: Rehabilitation

## 2017-08-31 ENCOUNTER — Ambulatory Visit: Payer: Medicare Other | Attending: Family Medicine | Admitting: Rehabilitation

## 2017-08-31 DIAGNOSIS — G8929 Other chronic pain: Secondary | ICD-10-CM | POA: Diagnosis present

## 2017-08-31 DIAGNOSIS — M5441 Lumbago with sciatica, right side: Secondary | ICD-10-CM | POA: Insufficient documentation

## 2017-08-31 DIAGNOSIS — M5416 Radiculopathy, lumbar region: Secondary | ICD-10-CM

## 2017-08-31 DIAGNOSIS — R262 Difficulty in walking, not elsewhere classified: Secondary | ICD-10-CM

## 2017-08-31 NOTE — Telephone Encounter (Signed)
Pt did not get the gapentin.  He got the rest of the medicine that was filled on nov.  Should be sent to [facility]. He is ready to have the xray/ Please set that up.

## 2017-08-31 NOTE — Therapy (Signed)
[place], [place], [address] Phone: [phone]   Fax:  [phone]  Physical Therapy Evaluation  Patient Details  Name: Jaime Barnes MRN: [id number] Date of Birth: [birth year] Referring Provider: Sherene Sires, DO   Encounter Date: 08/31/2017  PT End of Session - 08/31/17 1353    Visit Number  1    Number of Visits  12    Date for PT Re-Evaluation  10/12/17    Authorization Type  Medicare; 15th visit KX, Gcode    PT Start Time  1300    PT Stop Time  1400    PT Time Calculation (min)  60 min    Activity Tolerance  Patient tolerated treatment well    Behavior During Therapy  [facility] for tasks assessed/performed       Past Medical History:  Diagnosis Date  . Barrett's esophagus 2011   Recommend repeat EGD for surveillance in 2 yrs-[facility]  . Bipolar disorder ([place])   . Chronic kidney disease   . Cirrhosis ([place])   . Diverticulosis of colon   . GANGLION CYST, TENDON SHEATH 06/26/2010  . Hyperlipidemia   . Hypertension   . Impotence   . Migraine   . Pancreatitis   . Pulmonary nodule   . Pulmonary nodule seen on imaging study 02/06/2011   Followup CT in December 2014 with 20 months of stability at 4 mm.  No further imaging recommended     Past Surgical History:  Procedure Laterality Date  . CARDIAC CATHETERIZATION  2005   Nonobstructive, <40%  . TONSILLECTOMY Bilateral 1961    There were no vitals filed for this visit.   Subjective Assessment - 08/31/17 1305    Subjective  Pt presents with low back pain and pain into the R posterior thigh x 5-6 months.  Woke up one morning and it was like that.  It used to be intermittent and now it is constant.  Denies numbness and tingling.  Upon bowel and bladder questioning he reports this morning he felt like he had to go again but did not have to.  Told patient to just keep an eye on any other changes to tell MD.       Pertinent History  bipolar, kidney  disease, HTN, high cholesterol, back pain    Limitations  Walking;Standing    How long can you stand comfortably?  it will start to hurt    How long can you walk comfortably?  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  Yes    Pain Score  1  up to 10/10     Pain Location  Back    Pain Orientation  Mid;Right    Pain Descriptors / Indicators  Stabbing    Pain Type  Chronic pain    Pain Radiating Towards  R leg    Pain Onset  More than a month ago    Pain Frequency  Constant    Aggravating Factors   it is the worst in the mornings, walking too much    Pain Relieving Factors  lay down on the side with the leg raised up a bit    Multiple Pain Sites  No  [facility] PT Assessment - 08/31/17 0001      Assessment   Medical Diagnosis  back pain    Referring Provider  Sherene Sires, DO    Onset Date/Surgical Date  04/29/17    Next MD Visit  uknown    Prior Therapy  no      Precautions   Precautions  None      Restrictions   Weight Bearing Restrictions  No      Balance Screen   Has the patient fallen in the past 6 months  No      Prior Function   Level of Independence  Independent    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    Leisure  Tennis, jogging, walking      Observation/Other Assessments   Focus on Therapeutic Outcomes (FOTO)   53% limited      Sensation   Additional Comments  all dermatomes intact to light touch      Functional Tests   Functional tests  -- heel and toe walking normal      ROM / Strength   AROM / PROM / Strength  AROM;Strength      AROM   AROM Assessment Site  Lumbar    Lumbar Flexion  50% with pain    Lumbar Extension  full and feels good    Lumbar - Right Side Bend  full with pain    Lumbar - Left Side Bend  full with pain    Lumbar - Right  Rotation  full    Lumbar - Left Rotation  full      Strength   Overall Strength Comments  seated myotomes equal bilateral      Flexibility   Soft Tissue Assessment /Muscle Length  yes    Hamstrings  to 40 R with pain*    Quadriceps  normal    Piriformis  limited with pain*      Palpation   Palpation comment  2 ttp bil lumbar paraspinals, R QL, R glute      Special Tests    Special Tests  Lumbar    Lumbar Tests  Slump Test;Straight Leg Raise      Slump test   Findings  Positive    Side  Right      Straight Leg Raise   Findings  Positive    Side   Right             Objective measurements completed on examination: See above findings.      [facility] PT Treatment/Exercise - 08/31/17 0001      Exercises   Exercises  Lumbar;Other Exercises    Other Exercises   HEP performance x 1 per instructions      Modalities   Modalities  Electrical Stimulation;Moist Heat      Moist Heat Therapy   Number Minutes Moist Heat  10 Minutes    Moist Heat Location  Lumbar Spine      Electrical Stimulation   Electrical Stimulation Location  Lumbar    Electrical Stimulation Action  IFC    Electrical Stimulation Parameters  to tolerance    Electrical Stimulation Goals  Pain             PT Education - 08/31/17 1352    Education provided  Yes    Education Details  heat, avoiding flexion, HEP, POC, diagnosis, bowel and bladder changes to look for and to call MD if arise  Person(s) Educated  Patient    Methods  Explanation;Demonstration;Handout    Comprehension  Verbalized understanding;Returned demonstration;Need further instruction          PT Long Term Goals - 08/31/17 1442      PT LONG TERM GOAL #1   Title  Pt will decrease morning pain to 4/10 or less in the low back    Time  6    Period  Weeks    Status  New    Target Date  10/12/17      PT LONG TERM GOAL #2   Title  Pt will centralize radicular symptoms to no Rt leg pain    Time  6    Period  Weeks     Status  New    Target Date  10/12/17      PT LONG TERM GOAL #3   Title  Pt will demonstrate a negative SLR on the Right    Time  6    Period  Weeks    Status  New    Target Date  10/12/17      PT LONG TERM GOAL #4   Title  Pt will be able to return to taking his dog on walks as normal    Time  6    Period  Weeks    Status  New    Target Date  10/12/17             Plan - 08/31/17 1400    Clinical Impression Statement  Pt presents with back pain with radiation into the R posterior thigh worsening x 4 months.  Has not had any imaging at this time but is supposed to get it scheduled.  Signs and symptoms consistent with R sided sciatica but without nerve root compression.  Dermatomal and Myotomal testing normal as well as reflexes but positive slump sit, SLR on the Right.  Significant guarding and tenderness bil lumbar spine and into the R glute.  Reports less pain after modalities and movement today.       Clinical Presentation  Evolving    Clinical Presentation due to:  worsening of radicular symptoms    Clinical Decision Making  Low    Rehab Potential  Excellent    PT Frequency  2x / week    PT Duration  6 weeks    PT Treatment/Interventions  Electrical Stimulation;Manual techniques;Therapeutic exercise;Moist Heat;Traction;Taping;Dry needling    PT Next Visit Plan  review HEP, manual lumbar region and R glutes, modalities    PT Home Exercise Plan  12/3: SKTC, KTOS, LTR, standing extensions    Consulted and Agree with Plan of Care  Patient       Patient will benefit from skilled therapeutic intervention in order to improve the following deficits and impairments:  Pain, Difficulty walking  Visit Diagnosis: Chronic midline low back pain with right-sided sciatica - Plan: PT plan of care cert/re-cert  Difficulty in walking, not elsewhere classified - Plan: PT plan of care cert/re-cert  G-Codes - 24/09/73 1445    Functional Assessment Tool Used (Outpatient Only)  53%  limited    Functional Limitation  Mobility: Walking and moving around    Mobility: Walking and Moving Around Current Status (Z3299)  At least 40 percent but less than 60 percent impaired, limited or restricted    Mobility: Walking and Moving Around Goal Status (M4268)  At least 20 percent but less than 40 percent impaired, limited or restricted  Problem List Patient Active Problem List   Diagnosis Date Noted  . Radiculopathy of lumbar region 08/19/2017  . CKD (chronic kidney disease) stage 3, GFR 30-59 ml/min (HCC) 03/03/2017  . Exercise-induced bronchospasm 11/11/2016  . Migraine without aura and without status migrainosus, not intractable 12/20/2015  . Back pain 05/06/2015  . Constipation 05/06/2015  . Neuropathic pain 04/19/2015  . Syncope and collapse 04/19/2015  . Bilateral calf pain 04/19/2015  . Dizziness 11/02/2014  . Dystrophic nail 08/23/2013  . Tibialis posterior tendinitis 05/03/2013  . Anemia 08/24/2012  . Plaque psoriasis 08/24/2012  . Breast pain, left 11/27/2011  . Ejaculatory disorder 04/22/2011  . CAD (coronary artery disease) 03/28/2010  . HERPES SIMPLEX INFECTION 06/29/2009  . BARRETTS ESOPHAGUS 06/12/2008  . TOBACCO USER 03/11/2007  . Hyperlipidemia 11/26/2006  . BIPOLAR DISORDER 11/26/2006  . Migraine headache 11/26/2006  . HYPERTENSION, BENIGN SYSTEMIC 11/26/2006  . HEMORRHOIDS, NOS 11/26/2006  . PEPTIC ULCER DIS., UNSPEC. W/O OBSTRUCTION 11/26/2006  . DIVERTICULOSIS OF COLON 11/26/2006  . CIRRHOSIS, ALCOHOLIC 52/77/8242  . PANCREATITIS, CHRONIC 11/26/2006  . IMPOTENCE, ORGANIC 11/26/2006  . Insomnia 11/26/2006    Stark Bray, DPT, CMP 08/31/2017, 2:48 PM  [facility] [address] [place], [place], [address] Phone: [phone]   Fax:  [phone]  Name: Lenis Nettleton MRN: [id number] Date of Birth: 10/15/1950

## 2017-09-03 ENCOUNTER — Ambulatory Visit: Payer: Medicare Other | Admitting: Physical Therapy

## 2017-09-03 NOTE — Telephone Encounter (Signed)
Gabapentin and mri have both been ordered

## 2017-09-03 NOTE — Telephone Encounter (Signed)
Scheduled MRI for Monday 12/17 @ 1:00 PM. Pt informed of appt and that Gabapentin has been ordered. Ottis Stain, CMA

## 2017-09-07 ENCOUNTER — Ambulatory Visit: Payer: Medicare Other | Admitting: Physical Therapy

## 2017-09-09 ENCOUNTER — Ambulatory Visit: Payer: Medicare Other | Admitting: Physical Therapy

## 2017-09-10 ENCOUNTER — Encounter: Payer: Self-pay | Admitting: Physical Therapy

## 2017-09-10 ENCOUNTER — Ambulatory Visit: Payer: Medicare Other | Admitting: Physical Therapy

## 2017-09-10 DIAGNOSIS — G8929 Other chronic pain: Secondary | ICD-10-CM | POA: Diagnosis not present

## 2017-09-10 DIAGNOSIS — R262 Difficulty in walking, not elsewhere classified: Secondary | ICD-10-CM | POA: Diagnosis not present

## 2017-09-10 DIAGNOSIS — M5441 Lumbago with sciatica, right side: Principal | ICD-10-CM

## 2017-09-10 NOTE — Therapy (Addendum)
[place], [place], [address] Phone: [phone]   Fax:  [phone]  Physical Therapy Treatment and Discharge  Patient Details  Name: Jaime Barnes MRN: [id number] Date of Birth: 1951/02/06 Referring Provider: Sherene Sires, DO   Encounter Date: 09/10/2017  PT End of Session - 09/10/17 1059    Visit Number  2    Number of Visits  12    Date for PT Re-Evaluation  10/12/17    Authorization Type  Medicare; 15th visit Jaime Barnes    PT Start Time  1005    PT Stop Time  1104    PT Time Calculation (min)  59 min    Activity Tolerance  Patient tolerated treatment well    Behavior During Therapy  [facility] for tasks assessed/performed       Past Medical History:  Diagnosis Date  . Barrett's esophagus 2011   Recommend repeat EGD for surveillance in 2 yrs-[facility]  . Bipolar disorder ([place])   . Chronic kidney disease   . Cirrhosis ([place])   . Diverticulosis of colon   . GANGLION CYST, TENDON SHEATH 06/26/2010  . Hyperlipidemia   . Hypertension   . Impotence   . Migraine   . Pancreatitis   . Pulmonary nodule   . Pulmonary nodule seen on imaging study 02/06/2011   Followup CT in December 2014 with 20 months of stability at 4 mm.  No further imaging recommended     Past Surgical History:  Procedure Laterality Date  . CARDIAC CATHETERIZATION  2005   Nonobstructive, <40%  . TONSILLECTOMY Bilateral 1961    There were no vitals filed for this visit.  Subjective Assessment - 09/10/17 1010    Subjective  Have been doing my exercises everyday, seem to help.  Pain right now 7/10.  At night is when it is the worst.  10/10     Currently in Pain?  Yes    Pain Score  7     Pain Location  Back low lumbar    Pain Orientation  Right    Pain Descriptors / Indicators  -- grabbing and pulling     Pain Type  Chronic pain    Pain Radiating Towards  Rt. thigh     Pain Onset  More than a month ago    Pain Frequency  Constant     Aggravating Factors   AM and about midnight     Pain Relieving Factors  during the day it is OK    Multiple Pain Sites  No         [facility] PT Assessment - 09/10/17 0001      PROM   Overall PROM Comments  pain with Rt. hip prone ER and IR       Straight Leg Raise   Comment  traction to RLE in supine painful but tolerated.  He did better with prone manual traction                  [facility] Adult PT Treatment/Exercise - 09/10/17 0001      Lumbar Exercises: Stretches   Single Knee to Chest Stretch  2 reps;10 seconds too painful on Rt. LE     Lower Trunk Rotation  10 seconds x 10     Prone on Elbows Stretch  5 reps    Press Ups  5 reps      Lumbar Exercises: Prone   Straight Leg Raise  10 reps  Other Prone Lumbar Exercises  prone knee bend x 10 each side, cues     Other Prone Lumbar Exercises  Tranverse ABd contraction       Lumbar Exercises: Quadruped   Other Quadruped Lumbar Exercises  childs pose stretch x 3 as tolerated       Modalities   Modalities  Electrical Stimulation;Moist Heat      Moist Heat Therapy   Number Minutes Moist Heat  10 Minutes    Moist Heat Location  Lumbar Spine      Electrical Stimulation   Electrical Stimulation Location  Lumbar    Electrical Stimulation Action  IFC    Electrical Stimulation Parameters  to tol     Electrical Stimulation Goals  Pain      Manual Therapy   Manual Therapy  Passive ROM;Manual Traction    Passive ROM  Rt. hip     Manual Traction  long axis distraction R.t LE multiple reps              PT Education - 09/10/17 1258    Education provided  Yes    Education Details  HEP, pain vs stretch, going gently through stretch pain, prone , core contraction     Person(s) Educated  Patient    Methods  Explanation;Demonstration;Handout    Comprehension  Need further instruction;Verbalized understanding;Returned demonstration          PT Long Term Goals - 09/10/17 1301      PT LONG TERM GOAL #1   Title   Pt will decrease morning pain to 4/10 or less in the low back    Status  On-going      PT LONG TERM GOAL #2   Title  Pt will centralize radicular symptoms to no Rt leg pain    Status  On-going      PT LONG TERM GOAL #3   Title  Pt will demonstrate a negative SLR on the Right    Status  On-going      PT LONG TERM GOAL #4   Title  Pt will be able to return to taking his dog on walks as normal    Status  On-going            Plan - 09/10/17 1301    Clinical Impression Statement  Pt with increased pain today, unable to flex rt. hip to 90 deg for knee to chest stretching.  Prone relieved pain.  Traction did not relieve his pain but did feel better when he was in prone. Educated on going gently but consistently.  IFC and heat reduced guarding. No goals met.     PT Next Visit Plan  review HEP, manual lumbar region and R glutes, modalities    PT Home Exercise Plan  12/3: SKTC, KTOS, LTR, standing extensions, prone and childs pose     Consulted and Agree with Plan of Care  Patient       Patient will benefit from skilled therapeutic intervention in order to improve the following deficits and impairments:     Visit Diagnosis: Chronic midline low back pain with right-sided sciatica  Difficulty in walking, not elsewhere classified     Problem List Patient Active Problem List   Diagnosis Date Noted  . Radiculopathy of lumbar region 08/19/2017  . CKD (chronic kidney disease) stage 3, GFR 30-59 ml/min (HCC) 03/03/2017  . Exercise-induced bronchospasm 11/11/2016  . Migraine without aura and without status migrainosus, not intractable 12/20/2015  . Back pain 05/06/2015  .  Constipation 05/06/2015  . Neuropathic pain 04/19/2015  . Syncope and collapse 04/19/2015  . Bilateral calf pain 04/19/2015  . Dizziness 11/02/2014  . Dystrophic nail 08/23/2013  . Tibialis posterior tendinitis 05/03/2013  . Anemia 08/24/2012  . Plaque psoriasis 08/24/2012  . Breast pain, left 11/27/2011  .  Ejaculatory disorder 04/22/2011  . CAD (coronary artery disease) 03/28/2010  . HERPES SIMPLEX INFECTION 06/29/2009  . BARRETTS ESOPHAGUS 06/12/2008  . TOBACCO USER 03/11/2007  . Hyperlipidemia 11/26/2006  . BIPOLAR DISORDER 11/26/2006  . Migraine headache 11/26/2006  . HYPERTENSION, BENIGN SYSTEMIC 11/26/2006  . HEMORRHOIDS, NOS 11/26/2006  . PEPTIC ULCER DIS., UNSPEC. W/O OBSTRUCTION 11/26/2006  . DIVERTICULOSIS OF COLON 11/26/2006  . CIRRHOSIS, ALCOHOLIC 73/56/7014  . PANCREATITIS, CHRONIC 11/26/2006  . IMPOTENCE, ORGANIC 11/26/2006  . Insomnia 11/26/2006    PAA,JENNIFER 09/10/2017, 1:06 PM  [facility] [address] [place], [place], [address] Phone: [phone]   Fax:  [phone]  Name: Jaime Barnes MRN: [id number] Date of Birth: 1951/03/11  Raeford Razor, PT 09/10/17 1:06 PM Phone: [phone] Fax: [phone]   PHYSICAL THERAPY DISCHARGE SUMMARY  Visits from Start of Care: 2  Current functional level related to goals / functional outcomes: See above   Remaining deficits: See above    Education / Equipment: HEP Plan: Patient agrees to discharge.  Patient goals were not met. Patient is being discharged due to not returning since the last visit.  ?????    Raeford Razor, PT 10/09/17 9:55 AM Phone: [phone] Fax: [phone]  Was sick, did not return.

## 2017-09-11 ENCOUNTER — Ambulatory Visit: Payer: Medicare Other | Admitting: Podiatry

## 2017-09-11 ENCOUNTER — Other Ambulatory Visit: Payer: Self-pay | Admitting: Family Medicine

## 2017-09-11 DIAGNOSIS — N529 Male erectile dysfunction, unspecified: Secondary | ICD-10-CM

## 2017-09-14 ENCOUNTER — Ambulatory Visit: Payer: Medicare Other | Admitting: Physical Therapy

## 2017-09-14 ENCOUNTER — Ambulatory Visit (HOSPITAL_COMMUNITY)
Admission: RE | Admit: 2017-09-14 | Discharge: 2017-09-14 | Disposition: A | Discharge status: Home / Self Care | Payer: Medicare Other | Source: Ambulatory Visit | Attending: Family Medicine | Admitting: Family Medicine

## 2017-09-14 DIAGNOSIS — M545 Low back pain: Secondary | ICD-10-CM | POA: Diagnosis not present

## 2017-09-14 DIAGNOSIS — M5416 Radiculopathy, lumbar region: Secondary | ICD-10-CM

## 2017-09-14 DIAGNOSIS — M48061 Spinal stenosis, lumbar region without neurogenic claudication: Secondary | ICD-10-CM | POA: Insufficient documentation

## 2017-09-14 DIAGNOSIS — M47896 Other spondylosis, lumbar region: Secondary | ICD-10-CM | POA: Diagnosis not present

## 2017-09-16 ENCOUNTER — Ambulatory Visit: Payer: Medicare Other | Admitting: Physical Therapy

## 2017-09-18 ENCOUNTER — Encounter: Payer: Self-pay | Admitting: Podiatry

## 2017-09-18 ENCOUNTER — Ambulatory Visit (INDEPENDENT_AMBULATORY_CARE_PROVIDER_SITE_OTHER): Payer: Medicare Other | Admitting: Podiatry

## 2017-09-18 DIAGNOSIS — B351 Tinea unguium: Secondary | ICD-10-CM

## 2017-09-18 DIAGNOSIS — M79676 Pain in unspecified toe(s): Secondary | ICD-10-CM

## 2017-09-18 NOTE — Progress Notes (Signed)
Patient ID: Jaime Barnes, male   DOB: 09/08/1951, 66 y.o.   MRN: [id number] Complaint:  Visit Type: Patient returns to my office for continued preventative foot care services. Complaint: Patient states" my nails have grown long and thick and become painful to walk and wear shoes" . The patient presents for preventative foot care services. No changes to ROS  Podiatric Exam: Vascular: dorsalis pedis and posterior tibial pulses are palpable bilateral. Capillary return is immediate. Temperature gradient is WNL. Skin turgor WNL  Sensorium: Normal Semmes Weinstein monofilament test. Normal tactile sensation bilaterally. Nail Exam: Pt has thick disfigured discolored nails with subungual debris noted bilateral entire nail hallux through fifth toenails Ulcer Exam: There is no evidence of ulcer or pre-ulcerative changes or infection. Orthopedic Exam: Muscle tone and strength are WNL. No limitations in general ROM. No crepitus or effusions noted. Foot type and digits show no abnormalities. Bony prominences are unremarkable. Skin: No Porokeratosis. No infection or ulcers  Diagnosis:  Onychomycosis, , Pain in right toe, pain in left toes  Treatment & Plan Procedures and Treatment: Consent by patient was obtained for treatment procedures. The patient understood the discussion of treatment and procedures well. All questions were answered thoroughly reviewed. Debridement of mycotic and hypertrophic toenails, 1 through 5 bilateral and clearing of subungual debris. No ulceration, no infection noted.   Return Visit-Office Procedure: Patient instructed to return to the office for a follow up visit 10 weeks  for continued evaluation and treatment.    Shawana Knoch DPM 

## 2017-09-21 ENCOUNTER — Encounter: Payer: Medicare Other | Admitting: Physical Therapy

## 2017-09-23 ENCOUNTER — Encounter: Payer: Medicare Other | Admitting: Physical Therapy

## 2017-09-28 ENCOUNTER — Encounter: Payer: Medicare Other | Admitting: Physical Therapy

## 2017-09-30 ENCOUNTER — Other Ambulatory Visit: Payer: Self-pay | Admitting: Cardiovascular Disease

## 2017-09-30 NOTE — Telephone Encounter (Signed)
Please review for refill. Thanks!  

## 2017-10-01 ENCOUNTER — Other Ambulatory Visit: Payer: Self-pay | Admitting: Cardiovascular Disease

## 2017-10-01 NOTE — Telephone Encounter (Signed)
Refill Request.  

## 2017-10-01 NOTE — Telephone Encounter (Signed)
Please review for refill, Thanks !  

## 2017-10-07 ENCOUNTER — Telehealth: Payer: Self-pay | Admitting: Family Medicine

## 2017-10-07 NOTE — Telephone Encounter (Signed)
Pt wanting to know his results from his back x-rays from his last appointment. Pt back still "killing" him. He has an appointment 1/17 with Criss Rosales

## 2017-10-15 ENCOUNTER — Ambulatory Visit: Payer: Medicare Other | Admitting: Family Medicine

## 2017-10-30 ENCOUNTER — Other Ambulatory Visit: Payer: Self-pay | Admitting: Family Medicine

## 2017-11-25 ENCOUNTER — Other Ambulatory Visit: Payer: Self-pay | Admitting: Neurology

## 2017-11-25 ENCOUNTER — Other Ambulatory Visit: Payer: Self-pay | Admitting: Family Medicine

## 2017-11-25 DIAGNOSIS — R42 Dizziness and giddiness: Secondary | ICD-10-CM

## 2017-12-01 ENCOUNTER — Other Ambulatory Visit: Payer: Self-pay | Admitting: Family Medicine

## 2017-12-04 ENCOUNTER — Other Ambulatory Visit: Payer: Self-pay | Admitting: Family Medicine

## 2017-12-05 ENCOUNTER — Other Ambulatory Visit: Payer: Self-pay | Admitting: Family Medicine

## 2017-12-05 DIAGNOSIS — N529 Male erectile dysfunction, unspecified: Secondary | ICD-10-CM

## 2017-12-18 ENCOUNTER — Ambulatory Visit (INDEPENDENT_AMBULATORY_CARE_PROVIDER_SITE_OTHER): Payer: Medicare Other | Admitting: Podiatry

## 2017-12-18 ENCOUNTER — Encounter: Payer: Self-pay | Admitting: Podiatry

## 2017-12-18 DIAGNOSIS — B351 Tinea unguium: Secondary | ICD-10-CM

## 2017-12-18 DIAGNOSIS — M79676 Pain in unspecified toe(s): Secondary | ICD-10-CM

## 2017-12-18 NOTE — Progress Notes (Signed)
Patient ID: Jaime Barnes, male   DOB: 08/15/1951, 66 y.o.   MRN: [id number] Complaint:  Visit Type: Patient returns to my office for continued preventative foot care services. Complaint: Patient states" my nails have grown long and thick and become painful to walk and wear shoes" . The patient presents for preventative foot care services. No changes to ROS  Podiatric Exam: Vascular: dorsalis pedis and posterior tibial pulses are palpable bilateral. Capillary return is immediate. Temperature gradient is WNL. Skin turgor WNL  Sensorium: Normal Semmes Weinstein monofilament test. Normal tactile sensation bilaterally. Nail Exam: Pt has thick disfigured discolored nails with subungual debris noted bilateral entire nail hallux through fifth toenails Ulcer Exam: There is no evidence of ulcer or pre-ulcerative changes or infection. Orthopedic Exam: Muscle tone and strength are WNL. No limitations in general ROM. No crepitus or effusions noted. Foot type and digits show no abnormalities. Bony prominences are unremarkable. Skin: No Porokeratosis. No infection or ulcers  Diagnosis:  Onychomycosis, , Pain in right toe, pain in left toes  Treatment & Plan Procedures and Treatment: Consent by patient was obtained for treatment procedures. The patient understood the discussion of treatment and procedures well. All questions were answered thoroughly reviewed. Debridement of mycotic and hypertrophic toenails, 1 through 5 bilateral and clearing of subungual debris. No ulceration, no infection noted.   Return Visit-Office Procedure: Patient instructed to return to the office for a follow up visit 10 weeks  for continued evaluation and treatment.    Daisi Kentner DPM 

## 2017-12-30 ENCOUNTER — Other Ambulatory Visit: Payer: Self-pay | Admitting: Family Medicine

## 2017-12-30 ENCOUNTER — Other Ambulatory Visit: Payer: Self-pay | Admitting: Cardiovascular Disease

## 2017-12-30 ENCOUNTER — Other Ambulatory Visit: Payer: Self-pay | Admitting: Neurology

## 2017-12-30 DIAGNOSIS — R42 Dizziness and giddiness: Secondary | ICD-10-CM

## 2017-12-30 NOTE — Telephone Encounter (Signed)
Please review for refill, thanks ! 

## 2018-01-08 ENCOUNTER — Other Ambulatory Visit: Payer: Self-pay

## 2018-01-08 ENCOUNTER — Ambulatory Visit (INDEPENDENT_AMBULATORY_CARE_PROVIDER_SITE_OTHER): Payer: Medicare Other | Admitting: Family Medicine

## 2018-01-08 ENCOUNTER — Encounter: Payer: Self-pay | Admitting: Family Medicine

## 2018-01-08 VITALS — BP 130/70 | HR 64 | Temp 98.5°F | Ht 66.1 in | Wt 171.2 lb

## 2018-01-08 DIAGNOSIS — M48062 Spinal stenosis, lumbar region with neurogenic claudication: Secondary | ICD-10-CM

## 2018-01-08 DIAGNOSIS — K219 Gastro-esophageal reflux disease without esophagitis: Secondary | ICD-10-CM | POA: Diagnosis not present

## 2018-01-08 DIAGNOSIS — E785 Hyperlipidemia, unspecified: Secondary | ICD-10-CM | POA: Diagnosis not present

## 2018-01-08 DIAGNOSIS — N529 Male erectile dysfunction, unspecified: Secondary | ICD-10-CM | POA: Diagnosis not present

## 2018-01-08 DIAGNOSIS — K7030 Alcoholic cirrhosis of liver without ascites: Secondary | ICD-10-CM

## 2018-01-08 DIAGNOSIS — Z125 Encounter for screening for malignant neoplasm of prostate: Secondary | ICD-10-CM | POA: Diagnosis not present

## 2018-01-08 MED ORDER — AMLODIPINE BESYLATE 10 MG PO TABS: 10.0000 mg | ORAL_TABLET | Freq: Every day | ORAL | 3 refills | Status: DC

## 2018-01-08 MED ORDER — SILDENAFIL CITRATE 25 MG PO TABS: 50.0000 mg | ORAL_TABLET | Freq: Every day | ORAL | 0 refills | Status: DC | PRN

## 2018-01-08 MED ORDER — OMEPRAZOLE 20 MG PO CPDR: 20.0000 mg | DELAYED_RELEASE_CAPSULE | Freq: Every day | ORAL | 3 refills | Status: DC

## 2018-01-08 MED ORDER — TOPIRAMATE 100 MG PO TABS: 100.0000 mg | ORAL_TABLET | Freq: Two times a day (BID) | ORAL | 3 refills | Status: DC

## 2018-01-08 MED ORDER — GABAPENTIN 300 MG PO CAPS: 600.0000 mg | ORAL_CAPSULE | Freq: Three times a day (TID) | ORAL | 3 refills | Status: DC

## 2018-01-08 MED ORDER — HYDROXYZINE HCL 25 MG PO TABS: 25.0000 mg | ORAL_TABLET | Freq: Every evening | ORAL | 2 refills | Status: DC | PRN

## 2018-01-08 MED ORDER — ATORVASTATIN CALCIUM 40 MG PO TABS: 40.0000 mg | ORAL_TABLET | Freq: Every day | ORAL | 3 refills | Status: DC

## 2018-01-08 MED ORDER — ALBUTEROL SULFATE HFA 108 (90 BASE) MCG/ACT IN AERS: 2.0000 | INHALATION_SPRAY | Freq: Four times a day (QID) | RESPIRATORY_TRACT | 2 refills | Status: DC | PRN

## 2018-01-08 MED ORDER — TIZANIDINE HCL 4 MG PO TABS: 4.0000 mg | ORAL_TABLET | Freq: Every evening | ORAL | 0 refills | Status: DC | PRN

## 2018-01-08 NOTE — Progress Notes (Signed)
4/12/201912:11 PM  Jaime Barnes Jul 20, 1951, 67 y.o. male [id number]  Chief Complaint  Patient presents with  . Back Pain    having back pain when he walks    HPI:   Patient is a 67 y.o. male with past medical history significant for HTN, etoh cirrhosis, migraines, lumbar DDD among others who presents today to establish care and concerns for worsening low back pain.  He likes to walk to a nearby store daily, it is about 3/4 mile from his home up a gentle slope/hill For the past month every time he goes up the slope/hill he gets a burning tight feeling in his calves But when he comes down the hill his calves bo not bother him, but he will get an intense midline low back pain that radiates thru to his navel and will get a sudden urge to have a BM and needs to rush home He does not have these symptoms any other time He denies any saddle anesthesia He does have some occasional urinary hesitancy, nocturia.  He smokes a cigar a day, quit cigarettes in 2016  MRI Lumbar Spine 08/2017: IMPRESSION: Mild progression of spondylosis at L4-5 where there is moderate narrowing of the thecal sac due to a shallow disc bulge and prominent epidural fat.  Mild narrowing of the thecal sac via a shallow disc bulge and epidural fat at L2-3 is unchanged.  Depression screen [facility] 2/9 01/08/2018 08/18/2017 08/18/2017  Decreased Interest 0 0 0  Down, Depressed, Hopeless 0 0 0  PHQ - 2 Score 0 0 0  Altered sleeping - - -  Tired, decreased energy - - -  Change in appetite - - -  Feeling bad or failure about yourself  - - -  Trouble concentrating - - -  Moving slowly or fidgety/restless - - -  Suicidal thoughts - - -  PHQ-9 Score - - -  Difficult doing work/chores - - -  Some recent data might be hidden    Allergies  Allergen Reactions  . Codeine Phosphate Other (See Comments)    REACTION: breathing difficulty  . Penicillins Rash and Other (See Comments)    Break out in sweats/ Rash  amoxicillin. Denies airway involvement Has patient had a PCN reaction causing immediate rash, facial/tongue/throat swelling, SOB or lightheadedness with hypotension: Yes Has patient had a PCN reaction causing severe rash involving mucus membranes or skin necrosis: Yes Has patient had a PCN reaction that required hospitalization No Has patient had a PCN reaction occurring within the last 10 years: No If all of the above answers are "NO", then may proceed with Cephalosporin u    Prior to Admission medications   Medication Sig Start Date End Date Taking? Authorizing Provider  albuterol (PROVENTIL HFA;VENTOLIN HFA) 108 (90 Base) MCG/ACT inhaler INHALE 1 TO 2 PUFFS INTO THE LUNGS EVERY 6 HOURS AS NEEDED FOR WHEEZING OR SHORTNESS OF BREATH. USE 15 MINUTES PRIOR TO ACTIVITY 12/30/17  Yes Bland, Scott, DO  amLODipine (NORVASC) 10 MG tablet TAKE 1 TABLET BY MOUTH DAILY, STOP LISINOPRIL/HCTZ 12/30/17  Yes Skeet Latch, MD  aspirin 81 MG chewable tablet Chew 81 mg by mouth daily.   Yes [provider]  atorvastatin (LIPITOR) 40 MG tablet Take 1 tablet (40 mg total) by mouth daily. 08/18/17  Yes Bland, Scott, DO  gabapentin (NEURONTIN) 300 MG capsule Take 2 capsules (600 mg total) by mouth 3 (three) times daily. 08/18/17  Yes Bland, Scott, DO  hydrOXYzine (ATARAX/VISTARIL) 25 MG tablet Take 25  mg by mouth at bedtime as needed (headaches).  07/30/14  Yes [provider]  meclizine (ANTIVERT) 12.5 MG tablet TAKE 1 TABLET BY MOUTH THREE TIMES DAILY AS NEEDED FOR DIZZINESS 12/30/17  Yes Criss Rosales, Scott, DO  omeprazole (PRILOSEC) 20 MG capsule Take 1 capsule (20 mg total) by mouth daily. 12/29/16  Yes Ronnie Doss M, DO  omeprazole (PRILOSEC) 40 MG capsule  10/30/17  Yes [provider]  PROAIR HFA 108 (90 Base) MCG/ACT inhaler INHALE 1 TO 2 PUFFS INTO THE LUNGS EVERY 6 HOURS AS NEEDED FOR WHEEZING OR SHORTNESS OF BREATH. USE 15 MINUTES PRIOR TO ACTIVITY 10/30/17  Yes Bland, Scott, DO  PROAIR  HFA 108 (90 Base) MCG/ACT inhaler INHALE 1 TO 2 PUFFS INTO THE LUNGS EVERY 6 HOURS AS NEEDED FOR WHEEZING OR SHORTNESS OF BREATH. USE 15 MINUTES PRIOR TO ACTIVITY 11/25/17  Yes Enid Derry, [place], DO  sildenafil (VIAGRA) 25 MG tablet Take 2 tablets (50 mg total) by mouth daily as needed for erectile dysfunction. 12/07/17  Yes Bland, Scott, DO  SUMAtriptan (IMITREX) 100 MG tablet TAKE 1 TABLET BY MOUTH, MAY REPEAT ONCE IN 2 HOURS IF HEADACHE PERSISTS OR RECURS. DO NOT EXCEED 2 TABLETS IN 24 HOURS 12/30/17  Yes Jaffe, Adam R, DO  tiZANidine (ZANAFLEX) 4 MG tablet TAKE 1 TABLET(4 MG) BY MOUTH AT BEDTIME AS NEEDED FOR MUSCLE SPASMS 12/04/17  Yes Bland, Scott, DO  tiZANidine (ZANAFLEX) 4 MG tablet TAKE 1 TABLET BY MOUTH AT BEDTIME AS NEEDED FOR MUSCLE SPASMS 12/30/17  Yes Sherene Sires, DO  topiramate (TOPAMAX) 100 MG tablet TAKE 1 TABLET(100 MG) BY MOUTH TWICE DAILY 12/31/17  Yes Bland, Scott, DO  verapamil (CALAN) 120 MG tablet TAKE 1 TABLET(120 MG) BY MOUTH THREE TIMES DAILY 08/18/17  Yes Criss Rosales, Scott, DO  verapamil (CALAN) 120 MG tablet Take 120 mg by mouth 3 (three) times daily.   12/14/19 Yes [provider]    Past Medical History:  Diagnosis Date  . Barrett's esophagus 2011   Recommend repeat EGD for surveillance in 2 yrs-[facility]  . Bipolar disorder ([place])   . Chronic kidney disease   . Cirrhosis ([place])   . Diverticulosis of colon   . GANGLION CYST, TENDON SHEATH 06/26/2010  . Hyperlipidemia   . Hypertension   . Impotence   . Migraine   . Pancreatitis   . Pulmonary nodule   . Pulmonary nodule seen on imaging study 02/06/2011   Followup CT in December 2014 with 20 months of stability at 4 mm.  No further imaging recommended     Past Surgical History:  Procedure Laterality Date  . CARDIAC CATHETERIZATION  2005   Nonobstructive, <40%  . TONSILLECTOMY Bilateral 1961    Social History   Tobacco Use  . Smoking status: Light Tobacco Smoker    Packs/day: 0.25    Years: 53.00    Pack  years: 13.25    Types: Cigars    Start date: 09/29/1962    Last attempt to quit: 08/04/2016    Years since quitting: 1.4  . Smokeless tobacco: Former Systems developer    Quit date: 03/08/2017  . Tobacco comment: 2 a day  Substance Use Topics  . Alcohol use: No    Comment: Last drink "30 years ago"    Family History  Problem Relation Age of Onset  . Anemia Mother   . Hypertension Daughter   . Diabetes Daughter   . Migraines Daughter     Review of Systems  Constitutional: Negative for  chills and fever.  Respiratory: Negative for cough and shortness of breath.   Cardiovascular: Negative for chest pain, palpitations and leg swelling.  Gastrointestinal: Negative for abdominal pain, nausea and vomiting.     OBJECTIVE:  Blood pressure 130/70, pulse 64, temperature 98.5 F (36.9 C), temperature source Oral, height 5' 6.1" (1.679 m), weight 171 lb 3.2 oz (77.7 kg), SpO2 97 %.  Physical Exam  Constitutional: He is oriented to person, place, and time.  HENT:  Head: Normocephalic and atraumatic.  Mouth/Throat: Oropharynx is clear and moist.  Eyes: Pupils are equal, round, and reactive to light. EOM are normal.  Neck: Neck supple.  Cardiovascular: Normal rate and regular rhythm. Exam reveals no gallop and no friction rub.  No murmur heard. Pulses:      Dorsalis pedis pulses are 2+ on the right side, and 2+ on the left side.       Posterior tibial pulses are 2+ on the right side, and 2+ on the left side.  Pulmonary/Chest: Effort normal and breath sounds normal. He has no wheezes. He has no rales.  Genitourinary: Rectal exam shows anal tone normal. Prostate is not enlarged and not tender.  Neurological: He is alert and oriented to person, place, and time. He has normal strength and normal reflexes. No sensory deficit. Gait normal.  Negative SLR  Skin: Skin is warm and dry.    ASSESSMENT and PLAN  1. Spinal stenosis of lumbar region with neurogenic claudication ER precautions discussed,  referring to spine surg, advised avoidance of activities that cause symptoms until further evaluation - Ambulatory referral to Spine Surgery  2. Prostate cancer screening - PSA  3. Alcoholic cirrhosis of liver without ascites (HCC) - CBC - Comprehensive metabolic panel  4. Hyperlipidemia, unspecified hyperlipidemia type - atorvastatin (LIPITOR) 40 MG tablet; Take 1 tablet (40 mg total) by mouth daily.  5. Gastroesophageal reflux disease without esophagitis - omeprazole (PRILOSEC) 20 MG capsule; Take 1 capsule (20 mg total) by mouth daily.  6. Impotence, organic - sildenafil (VIAGRA) 25 MG tablet; Take 2 tablets (50 mg total) by mouth daily as needed for erectile dysfunction.  Other orders - amLODipine (NORVASC) 10 MG tablet; Take 1 tablet (10 mg total) by mouth daily. - hydrOXYzine (ATARAX/VISTARIL) 25 MG tablet; Take 1 tablet (25 mg total) by mouth at bedtime as needed (headaches). - albuterol (PROVENTIL HFA;VENTOLIN HFA) 108 (90 Base) MCG/ACT inhaler; Inhale 2 puffs into the lungs every 6 (six) hours as needed for wheezing or shortness of breath. - tiZANidine (ZANAFLEX) 4 MG tablet; Take 1 tablet (4 mg total) by mouth at bedtime as needed for muscle spasms. - topiramate (TOPAMAX) 100 MG tablet; Take 1 tablet (100 mg total) by mouth 2 (two) times daily.  Return for after you see spine surgeon.    Rutherford Guys, MD [facility] [place], [place] [address] Ph.  [phone] Fax [phone]

## 2018-01-08 NOTE — Patient Instructions (Addendum)
  I have ordered either a referral to a spine surgeon. Please be mindful that it usually takes about 2 weeks to be called for an appointment. However if you have not be called for your appointment, please call us at [phone] and ask to speak with a referral clerk to further inquire about either your referral.   IF you received an x-ray today, you will receive an invoice from [facility]. Please contact [facility] at [phone] with questions or concerns regarding your invoice.   IF you received labwork today, you will receive an invoice from [facility]. Please contact [facility] at [phone] with questions or concerns regarding your invoice.   Our billing staff will not be able to assist you with questions regarding bills from these companies.  You will be contacted with the lab results as soon as they are available. The fastest way to get your results is to activate your My Chart account. Instructions are located on the last page of this paperwork. If you have not heard from [place] regarding the results in 2 weeks, please contact this office.

## 2018-01-09 LAB — COMPREHENSIVE METABOLIC PANEL
ALT: 13 IU/L (ref 0–44)
AST: 14 IU/L (ref 0–40)
Albumin/Globulin Ratio: 1.8 (ref 1.2–2.2)
Albumin: 4.8 g/dL (ref 3.6–4.8)
Alkaline Phosphatase: 103 IU/L (ref 39–117)
BUN/Creatinine Ratio: 11 (ref 10–24)
BUN: 14 mg/dL (ref 8–27)
Bilirubin Total: <0.2 mg/dL (ref 0.0–1.2)
CO2: 21 mmol/L (ref 20–29)
Calcium: 9.7 mg/dL (ref 8.6–10.2)
Chloride: 106 mmol/L (ref 96–106)
Creatinine, Ser: 1.25 mg/dL (ref 0.76–1.27)
GFR calc Af Amer: 68 mL/min/{1.73_m2} (ref >59)
GFR calc non Af Amer: 59 mL/min/{1.73_m2} — ABNORMAL LOW (ref >59)
Globulin, Total: 2.6 g/dL (ref 1.5–4.5)
Glucose: 96 mg/dL (ref 65–99)
Potassium: 4.1 mmol/L (ref 3.5–5.2)
Sodium: 143 mmol/L (ref 134–144)
Total Protein: 7.4 g/dL (ref 6.0–8.5)

## 2018-01-09 LAB — CBC
Hematocrit: 40.7 % (ref 37.5–51.0)
Hemoglobin: 13.6 g/dL (ref 13.0–17.7)
MCH: 30.4 pg (ref 26.6–33.0)
MCHC: 33.4 g/dL (ref 31.5–35.7)
MCV: 91 fL (ref 79–97)
Platelets: 254 10*3/uL (ref 150–379)
RBC: 4.48 x10E6/uL (ref 4.14–5.80)
RDW: 14.2 % (ref 12.3–15.4)
WBC: 9.7 10*3/uL (ref 3.4–10.8)

## 2018-01-09 LAB — PSA: Prostate Specific Ag, Serum: 1 ng/mL (ref 0.0–4.0)

## 2018-01-17 ENCOUNTER — Encounter: Payer: Self-pay | Admitting: Family Medicine

## 2018-01-21 ENCOUNTER — Encounter: Payer: Self-pay | Admitting: Neurology

## 2018-01-26 ENCOUNTER — Other Ambulatory Visit: Payer: Self-pay | Admitting: Family Medicine

## 2018-01-27 ENCOUNTER — Other Ambulatory Visit: Payer: Self-pay | Admitting: Family Medicine

## 2018-02-01 DIAGNOSIS — H10413 Chronic giant papillary conjunctivitis, bilateral: Secondary | ICD-10-CM | POA: Diagnosis not present

## 2018-02-01 DIAGNOSIS — H2511 Age-related nuclear cataract, right eye: Secondary | ICD-10-CM | POA: Diagnosis not present

## 2018-02-01 DIAGNOSIS — H25813 Combined forms of age-related cataract, bilateral: Secondary | ICD-10-CM | POA: Diagnosis not present

## 2018-02-01 DIAGNOSIS — H25812 Combined forms of age-related cataract, left eye: Secondary | ICD-10-CM | POA: Diagnosis not present

## 2018-02-01 DIAGNOSIS — H4089 Other specified glaucoma: Secondary | ICD-10-CM | POA: Diagnosis not present

## 2018-02-18 ENCOUNTER — Telehealth: Payer: Self-pay

## 2018-02-18 NOTE — Telephone Encounter (Signed)
Copied from [place] [id number]. Topic: Quick Communication - Lab Results >> Feb 17, 2018 11:26 AM Lennox Solders wrote: Pt is calling and would like blood work results from 01-08-18. Pt said he never received a letter patient did verify his address

## 2018-02-19 NOTE — Telephone Encounter (Signed)
Pt advised.

## 2018-02-23 ENCOUNTER — Other Ambulatory Visit: Payer: Self-pay | Admitting: Family Medicine

## 2018-02-24 ENCOUNTER — Encounter: Payer: Self-pay | Admitting: Podiatry

## 2018-02-24 ENCOUNTER — Ambulatory Visit (INDEPENDENT_AMBULATORY_CARE_PROVIDER_SITE_OTHER): Payer: Medicare Other | Admitting: Podiatry

## 2018-02-24 DIAGNOSIS — B351 Tinea unguium: Secondary | ICD-10-CM | POA: Diagnosis not present

## 2018-02-24 DIAGNOSIS — M79676 Pain in unspecified toe(s): Secondary | ICD-10-CM | POA: Diagnosis not present

## 2018-02-24 NOTE — Progress Notes (Signed)
Patient ID: Jaime Barnes, male   DOB: 05/24/1951, 66 y.o.   MRN: [id number] Complaint:  Visit Type: Patient returns to my office for continued preventative foot care services. Complaint: Patient states" my nails have grown long and thick and become painful to walk and wear shoes" . The patient presents for preventative foot care services. No changes to ROS  Podiatric Exam: Vascular: dorsalis pedis and posterior tibial pulses are palpable bilateral. Capillary return is immediate. Temperature gradient is WNL. Skin turgor WNL  Sensorium: Normal Semmes Weinstein monofilament test. Normal tactile sensation bilaterally. Nail Exam: Pt has thick disfigured discolored nails with subungual debris noted bilateral entire nail hallux through fifth toenails Ulcer Exam: There is no evidence of ulcer or pre-ulcerative changes or infection. Orthopedic Exam: Muscle tone and strength are WNL. No limitations in general ROM. No crepitus or effusions noted. Foot type and digits show no abnormalities. Bony prominences are unremarkable. Skin: No Porokeratosis. No infection or ulcers  Diagnosis:  Onychomycosis, , Pain in right toe, pain in left toes  Treatment & Plan Procedures and Treatment: Consent by patient was obtained for treatment procedures. The patient understood the discussion of treatment and procedures well. All questions were answered thoroughly reviewed. Debridement of mycotic and hypertrophic toenails, 1 through 5 bilateral and clearing of subungual debris. No ulceration, no infection noted.   Return Visit-Office Procedure: Patient instructed to return to the office for a follow up visit 10 weeks  for continued evaluation and treatment.    Dierdra Salameh DPM 

## 2018-02-25 ENCOUNTER — Other Ambulatory Visit: Payer: Self-pay | Admitting: Cardiovascular Disease

## 2018-02-25 ENCOUNTER — Other Ambulatory Visit: Payer: Self-pay | Admitting: Family Medicine

## 2018-02-25 NOTE — Telephone Encounter (Signed)
Rx sent to pharmacy   

## 2018-03-16 ENCOUNTER — Ambulatory Visit: Payer: Self-pay

## 2018-03-16 NOTE — Telephone Encounter (Signed)
Pt. Called to report episodes of feeling light headed. Reported he has had this occur about qod over past 2-3 weeks.  Reported he was walking his dog today, and started to feel funny, and had sudden onset of dizziness.  Stated he had not gone very far from home, and turned around to get headed back home.  Stated as he got close to home everything felt "lighter and lighter."  He got into the house and got to the bedroom, and fell into his bed.  Reported when he walks he has pain in his buttocks   Stated he did not pass out.  Reported this has been discussed at previous doctor's appts., but nothing has been done to evaluate it further.  Reported he was given Rx for Meclizine.  VS checked during call: BP 138/71, Pulse 63.  Offered an appt. tomorrow, 6/19, but stated he is unable to line up transportation before 3 days notice.  Appt. given 03/19/18 @ 4:00 PM.  Care advice given per protocol.  Verb. understanding.  Encouraged to call back if sx's worsen.  Agrees with plan.        Reason for Disposition . [1] MODERATE dizziness (e.g., interferes with normal activities) AND [2] has NOT been evaluated by physician for this  (Exception: dizziness caused by heat exposure, sudden standing, or poor fluid intake)  Answer Assessment - Initial Assessment Questions 1. DESCRIPTION: "Describe your dizziness."     Felt like he was going to pass out;  2. LIGHTHEADED: "Do you feel lightheaded?" (e.g., somewhat faint, woozy, weak upon standing)     Felt light-headed like he was going to pass out 3. VERTIGO: "Do you feel like either you or the room is spinning or tilting?" (i.e. vertigo)     Denied spinning or tilting  4. SEVERITY: "How bad is it?"  "Do you feel like you are going to faint?" "Can you stand and walk?"   - MILD - walking normally   - MODERATE - interferes with normal activities (e.g., work, school)    - SEVERE - unable to stand, requires support to walk, feels like passing out now.      Moderate to severe 5.  ONSET:  "When did the dizziness begin?"     Today while on a walk; subsided after 25 minutes of rest  6. AGGRAVATING FACTORS: "Does anything make it worse?" (e.g., standing, change in head position)     Denied an aggravating factor 7. HEART RATE: "Can you tell me your heart rate?" "How many beats in 15 seconds?"  (Note: not all patients can do this)       BP 138/71 in right arm and pulse 63 @ 3:40 PM with digital BP cuff 8. CAUSE: "What do you think is causing the dizziness?"     unknown 9. RECURRENT SYMPTOM: "Have you had dizziness before?" If so, ask: "When was the last time?" "What happened that time?"     He has had these symptoms in the past, and was given Meclizine for the dizziness. 10. OTHER SYMPTOMS: "Do you have any other symptoms?" (e.g., fever, chest pain, vomiting, diarrhea, bleeding)       Denied fever/ chills; denied chest pain, shortness of breath, blurred vision, sweating; c/o aching from buttocks down the post. thigh bilateral with walking; improves with rest;  Protocols used: DIZZINESS [facility]

## 2018-03-19 ENCOUNTER — Encounter: Payer: Self-pay | Admitting: Family Medicine

## 2018-03-19 ENCOUNTER — Other Ambulatory Visit: Payer: Self-pay

## 2018-03-19 ENCOUNTER — Ambulatory Visit (INDEPENDENT_AMBULATORY_CARE_PROVIDER_SITE_OTHER): Payer: Medicare Other | Admitting: Family Medicine

## 2018-03-19 VITALS — Temp 98.6°F | Ht 66.0 in | Wt 165.0 lb

## 2018-03-19 DIAGNOSIS — R6889 Other general symptoms and signs: Secondary | ICD-10-CM

## 2018-03-19 DIAGNOSIS — R7989 Other specified abnormal findings of blood chemistry: Secondary | ICD-10-CM

## 2018-03-19 DIAGNOSIS — R42 Dizziness and giddiness: Secondary | ICD-10-CM | POA: Diagnosis not present

## 2018-03-19 DIAGNOSIS — M48061 Spinal stenosis, lumbar region without neurogenic claudication: Secondary | ICD-10-CM

## 2018-03-19 DIAGNOSIS — E785 Hyperlipidemia, unspecified: Secondary | ICD-10-CM | POA: Diagnosis not present

## 2018-03-19 DIAGNOSIS — M79662 Pain in left lower leg: Secondary | ICD-10-CM | POA: Diagnosis not present

## 2018-03-19 DIAGNOSIS — M79661 Pain in right lower leg: Secondary | ICD-10-CM

## 2018-03-19 LAB — POCT URINALYSIS DIP (MANUAL ENTRY)
Bilirubin, UA: NEGATIVE
Blood, UA: NEGATIVE
Glucose, UA: NEGATIVE mg/dL
Ketones, POC UA: NEGATIVE mg/dL
Leukocytes, UA: NEGATIVE
Nitrite, UA: NEGATIVE
Protein Ur, POC: NEGATIVE mg/dL
Spec Grav, UA: 1.025 (ref 1.010–1.025)
Urobilinogen, UA: 0.2 E.U./dL
pH, UA: 5.5 (ref 5.0–8.0)

## 2018-03-19 MED ORDER — AMLODIPINE BESYLATE 10 MG PO TABS: 10.0000 mg | ORAL_TABLET | Freq: Every day | ORAL | 3 refills | Status: DC

## 2018-03-19 MED ORDER — VERAPAMIL HCL 120 MG PO TABS: 120.0000 mg | ORAL_TABLET | Freq: Three times a day (TID) | ORAL | 3 refills | Status: DC

## 2018-03-19 NOTE — Patient Instructions (Signed)
     IF you received an x-ray today, you will receive an invoice from [facility]. Please contact [facility] at [phone] with questions or concerns regarding your invoice.   IF you received labwork today, you will receive an invoice from [facility]. Please contact [facility] at [phone] with questions or concerns regarding your invoice.   Our billing staff will not be able to assist you with questions regarding bills from these companies.  You will be contacted with the lab results as soon as they are available. The fastest way to get your results is to activate your My Chart account. Instructions are located on the last page of this paperwork. If you have not heard from us regarding the results in 2 weeks, please contact this office.     

## 2018-03-19 NOTE — Progress Notes (Signed)
6/21/20193:51 PM  Jaime Barnes [birth year], 67 y.o. male [id number]  Chief Complaint  Patient presents with  . Dizziness  . Back Pain    HPI:   Patient is a 67 y.o. male with past medical history significant for HTN, non-obstructive CAD, abnormal ABI, spinal stenosis, and chronic dizziness who presents today for concerning recent episode of dizziness  Patient reports that about 3 days ago he was walking his dog on flat surface when coming back home he all of the sudden felt his lower legs become tight, numb and weak. He also felt very light headed. He did not think that he would make it home.  He lied down for about 20 minutes and then felt back to normal  He reports that he has had dizziness for years, always light headed, lies down for 20-30 minutes and then feels back to normal Cant think of precipitating factors, he had pre-syncope once couple years ago He denies any associated sx, no chest pain, palpitations, vision changes, SOB, nausea, vomiting, headache He reports drinking about 10 water bottles a day  He is not dizzy noted  Has not sent in paperwork for spine surgeon  Normal head ct in 2016 Nonobstructive CAD, cath in 2016 ABI 2016 1.21, bilaterally, 30-49% bilateral SFA disease, without focal stenosis. Three vessel run-off on the right. Occluded left peroneal artery, with two vessel run-off on the left.    Fall Risk  03/19/2018 01/08/2018 08/11/2017 05/12/2017 05/12/2017  Falls in the past year? No No No No No  Number falls in past yr: - - - - 2 or more  Injury with Fall? - - - - -     Depression screen [facility] 2/9 03/19/2018 01/08/2018 08/18/2017  Decreased Interest 0 0 0  Down, Depressed, Hopeless 0 0 0  PHQ - 2 Score 0 0 0  Altered sleeping - - -  Tired, decreased energy - - -  Change in appetite - - -  Feeling bad or failure about yourself  - - -  Trouble concentrating - - -  Moving slowly or fidgety/restless - - -  Suicidal thoughts - - -  PHQ-9 Score - - -    Difficult doing work/chores - - -  Some recent data might be hidden    Allergies  Allergen Reactions  . Codeine Phosphate Other (See Comments)    REACTION: breathing difficulty  . Penicillins Rash and Other (See Comments)    Break out in sweats/ Rash amoxicillin. Denies airway involvement Has patient had a PCN reaction causing immediate rash, facial/tongue/throat swelling, SOB or lightheadedness with hypotension: Yes Has patient had a PCN reaction causing severe rash involving mucus membranes or skin necrosis: Yes Has patient had a PCN reaction that required hospitalization No Has patient had a PCN reaction occurring within the last 10 years: No If all of the above answers are "NO", then may proceed with Cephalosporin u    Prior to Admission medications   Medication Sig Start Date End Date Taking? Authorizing Provider  albuterol (PROVENTIL HFA;VENTOLIN HFA) 108 (90 Base) MCG/ACT inhaler Inhale 2 puffs into the lungs every 6 (six) hours as needed for wheezing or shortness of breath. 01/08/18  Yes Rutherford Guys, MD  amLODipine (NORVASC) 10 MG tablet Take 1 tablet (10 mg total) by mouth daily. 01/08/18  Yes Rutherford Guys, MD  aspirin 81 MG chewable tablet Chew 81 mg by mouth daily.   Yes [provider]  atorvastatin (LIPITOR) 40 MG tablet Take 1  tablet (40 mg total) by mouth daily. 01/08/18  Yes Rutherford Guys, MD  gabapentin (NEURONTIN) 300 MG capsule Take 2 capsules (600 mg total) by mouth 3 (three) times daily. 01/08/18  Yes Rutherford Guys, MD  hydrOXYzine (ATARAX/VISTARIL) 25 MG tablet Take 1 tablet (25 mg total) by mouth at bedtime as needed (headaches). 01/08/18  Yes Rutherford Guys, MD  meclizine (ANTIVERT) 12.5 MG tablet TAKE 1 TABLET BY MOUTH THREE TIMES DAILY AS NEEDED FOR DIZZINESS 12/30/17  Yes Sherene Sires, DO  omeprazole (PRILOSEC) 20 MG capsule Take 1 capsule (20 mg total) by mouth daily. 01/08/18  Yes Rutherford Guys, MD  sildenafil (VIAGRA) 25 MG tablet Take 2  tablets (50 mg total) by mouth daily as needed for erectile dysfunction. 01/08/18  Yes Rutherford Guys, MD  SUMAtriptan (IMITREX) 100 MG tablet TAKE 1 TABLET BY MOUTH, MAY REPEAT ONCE IN 2 HOURS IF HEADACHE PERSISTS OR RECURS. DO NOT EXCEED 2 TABLETS IN 24 HOURS 12/30/17  Yes Jaffe, Adam R, DO  tiZANidine (ZANAFLEX) 4 MG tablet Take 1 tablet (4 mg total) by mouth at bedtime as needed for muscle spasms. 01/08/18  Yes Rutherford Guys, MD  topiramate (TOPAMAX) 100 MG tablet Take 1 tablet (100 mg total) by mouth 2 (two) times daily. 01/08/18  Yes Rutherford Guys, MD  verapamil (CALAN) 120 MG tablet TAKE 1 TABLET(120 MG) BY MOUTH THREE TIMES DAILY 01/27/18  Yes Criss Rosales, Scott, DO  verapamil (CALAN) 120 MG tablet TAKE 1 TABLET(120 MG) BY MOUTH THREE TIMES DAILY 02/23/18  Yes Criss Rosales, Scott, DO  verapamil (CALAN) 120 MG tablet Take 120 mg by mouth 3 (three) times daily.   12/14/19 Yes [provider]    Past Medical History:  Diagnosis Date  . Barrett's esophagus 2011   Recommend repeat EGD for surveillance in 2 yrs-[facility]  . Bipolar disorder ([place])   . Chronic kidney disease   . Cirrhosis ([place])   . Diverticulosis of colon   . GANGLION CYST, TENDON SHEATH 06/26/2010  . Hyperlipidemia   . Hypertension   . Impotence   . Migraine   . Pancreatitis   . Pulmonary nodule   . Pulmonary nodule seen on imaging study 02/06/2011   Followup CT in December 2014 with 20 months of stability at 4 mm.  No further imaging recommended     Past Surgical History:  Procedure Laterality Date  . CARDIAC CATHETERIZATION  2005   Nonobstructive, <40%  . TONSILLECTOMY Bilateral 1961    Social History   Tobacco Use  . Smoking status: Light Tobacco Smoker    Packs/day: 0.25    Years: 53.00    Pack years: 13.25    Types: Cigars    Start date: 09/29/1962    Last attempt to quit: 08/04/2016    Years since quitting: 1.6  . Smokeless tobacco: Former Systems developer    Quit date: 03/08/2017  . Tobacco comment: 2 a day    Substance Use Topics  . Alcohol use: No    Comment: Last drink "30 years ago"    Family History  Problem Relation Age of Onset  . Anemia Mother   . Hypertension Daughter   . Diabetes Daughter   . Migraines Daughter     ROS Per hpi  OBJECTIVE:  Temperature 98.6 F (37 C), temperature source Oral, height 5\' 6"  (1.676 m), weight 165 lb (74.8 kg), SpO2 99 %.  Lying: 140/82, 99 Sitting: 120/80, 94 Standing: 132/82, 98  Physical Exam  Constitutional: He is oriented to person, place, and time.  HENT:  Head: Normocephalic and atraumatic.  Right Ear: Hearing, tympanic membrane, external ear and ear canal normal.  Left Ear: Hearing, tympanic membrane, external ear and ear canal normal.  Mouth/Throat: Oropharynx is clear and moist. No oropharyngeal exudate.  Eyes: Pupils are equal, round, and reactive to light. Conjunctivae and EOM are normal.  Neck: Neck supple. Carotid bruit is not present.  Cardiovascular: Normal rate and regular rhythm. Exam reveals no gallop and no friction rub.  No murmur heard. Pulmonary/Chest: Effort normal and breath sounds normal. He has no wheezes. He has no rales.  Lymphadenopathy:    He has no cervical adenopathy.  Neurological: He is alert and oriented to person, place, and time. He has normal strength. He displays normal reflexes. No cranial nerve deficit. Coordination and gait normal.  Skin: Skin is warm and dry.    ASSESSMENT and PLAN  1. Dizziness Suggestive of vasovagal, sending to cards for further eval with tilt-table if appropriate. - Orthostatic vital signs - POCT urinalysis dipstick - Ambulatory referral to Cardiology  2. Pain in both lower legs 3. Abnormal ankle brachial index (ABI) 4. Spinal stenosis of lumbar region, unspecified whether neurogenic claudication present PAD vs spinal stenosis. Re-eval ABI, encouraged him to see spine, consider vasc surg. On statin. Is he on ASA? - VAS [place] ABI WITH/WO TBI; Future  5.  Hyperlipidemia, unspecified hyperlipidemia type - Lipid panel  6. Elevated serum creatinine - Comprehensive metabolic panel - Lipid panel  Other orders - amLODipine (NORVASC) 10 MG tablet; Take 1 tablet (10 mg total) by mouth daily. - verapamil (CALAN) 120 MG tablet; Take 1 tablet (120 mg total) by mouth 3 (three) times daily.  Return in about 3 months (around 06/19/2018).    Rutherford Guys, MD [facility] [place], [place] [address] Ph.  [phone] Fax [phone]

## 2018-03-20 LAB — COMPREHENSIVE METABOLIC PANEL
ALT: 16 IU/L (ref 0–44)
AST: 33 IU/L (ref 0–40)
Albumin/Globulin Ratio: 1.8 (ref 1.2–2.2)
Albumin: 4.5 g/dL (ref 3.6–4.8)
Alkaline Phosphatase: 93 IU/L (ref 39–117)
BUN/Creatinine Ratio: 9 — ABNORMAL LOW (ref 10–24)
BUN: 14 mg/dL (ref 8–27)
Bilirubin Total: <0.2 mg/dL (ref 0.0–1.2)
CO2: 18 mmol/L — ABNORMAL LOW (ref 20–29)
Calcium: 9.2 mg/dL (ref 8.6–10.2)
Chloride: 113 mmol/L — ABNORMAL HIGH (ref 96–106)
Creatinine, Ser: 1.48 mg/dL — ABNORMAL HIGH (ref 0.76–1.27)
GFR calc Af Amer: 56 mL/min/{1.73_m2} — ABNORMAL LOW (ref >59)
GFR calc non Af Amer: 48 mL/min/{1.73_m2} — ABNORMAL LOW (ref >59)
Globulin, Total: 2.5 g/dL (ref 1.5–4.5)
Glucose: 98 mg/dL (ref 65–99)
Potassium: 3.4 mmol/L — ABNORMAL LOW (ref 3.5–5.2)
Sodium: 138 mmol/L (ref 134–144)
Total Protein: 7 g/dL (ref 6.0–8.5)

## 2018-03-20 LAB — LIPID PANEL
Chol/HDL Ratio: 3.1 ratio (ref 0.0–5.0)
Cholesterol, Total: 111 mg/dL (ref 100–199)
HDL: 36 mg/dL — ABNORMAL LOW (ref >39)
LDL Calculated: 46 mg/dL (ref 0–99)
Triglycerides: 145 mg/dL (ref 0–149)
VLDL Cholesterol Cal: 29 mg/dL (ref 5–40)

## 2018-04-12 ENCOUNTER — Ambulatory Visit (INDEPENDENT_AMBULATORY_CARE_PROVIDER_SITE_OTHER): Payer: Medicare Other | Admitting: Neurology

## 2018-04-12 ENCOUNTER — Encounter: Payer: Self-pay | Admitting: Neurology

## 2018-04-12 VITALS — BP 120/72 | HR 55 | Ht 66.0 in | Wt 166.0 lb

## 2018-04-12 DIAGNOSIS — G43009 Migraine without aura, not intractable, without status migrainosus: Secondary | ICD-10-CM

## 2018-04-12 MED ORDER — TOPIRAMATE 100 MG PO TABS: 100.0000 mg | ORAL_TABLET | Freq: Two times a day (BID) | ORAL | 7 refills | Status: DC

## 2018-04-12 MED ORDER — SUMATRIPTAN SUCCINATE 100 MG PO TABS: ORAL_TABLET | ORAL | 7 refills | Status: DC

## 2018-04-12 NOTE — Progress Notes (Signed)
NEUROLOGY FOLLOW UP OFFICE NOTE  Jaime Barnes [id number]  HISTORY OF PRESENT ILLNESS: Jaime Barnes is a 67 year old right-handed male with hypertension, Bipolar disorder, CKD, tobacco user, Barrett's esophagus and cirrhosis who follows up for migraine.   UPDATE: Intensity:  10/10 Duration:  Briefly (minutes) with sumatriptan Frequency:  Usually 2 to 3 days per month. Current NSAIDS:  none Current analgesics:  none Current triptans:  sumatriptan 100mg  Current anti-emetic:  none Current muscle relaxants:  tizanidine Current Antihypertensive medications:  Lisinopril 10mg , verapamil 120mg  three times daily Current Antidepressant medications:  none Current Anticonvulsant medications:  topiramate 100mg  twice daily, gabapentin 300mg  twice daily   03/19/18:  BUN 14, Cr 1.48 (baseline)   HISTORY: Onset:  Has remote history of migraines but they returned in 2016 after experiencing increased stress related to his ex-girlfriend. Location:  Right sided Quality:  pounding Initial Intensity:  "50"/10 Aura:  no Prodrome:  no Associated symptoms:  Photophobia, phonophobia.  No nausea or visual disturbance Initial Duration:  Until takes medication Initial Frequency:  every other day Triggers/exacerbating factors:  stress Relieving factors:  none Activity:  Cannot function   Past abortive medication:  Sumatriptan 50mg  (needed 3 tablets for it to work), ibuprofen Past preventative medication:  none Other past therapy:  none   CT of head from 07/19/15 was unremarkable.    MRI of brain from 12/04/14 showed global atrophy and mild small vessel ischemic changes. 06/23/16:  Sed rate 25  PAST MEDICAL HISTORY: Past Medical History:  Diagnosis Date  . Barrett's esophagus 2011   Recommend repeat EGD for surveillance in 2 yrs-[facility]  . Bipolar disorder ([place])   . Chronic kidney disease   . Cirrhosis ([place])   . Diverticulosis of colon   . GANGLION CYST, TENDON SHEATH 06/26/2010  . Hyperlipidemia    . Hypertension   . Impotence   . Migraine   . Pancreatitis   . Pulmonary nodule   . Pulmonary nodule seen on imaging study 02/06/2011   Followup CT in December 2014 with 20 months of stability at 4 mm.  No further imaging recommended     MEDICATIONS: Current Outpatient Medications on File Prior to Visit  Medication Sig Dispense Refill  . albuterol (PROVENTIL HFA;VENTOLIN HFA) 108 (90 Base) MCG/ACT inhaler Inhale 2 puffs into the lungs every 6 (six) hours as needed for wheezing or shortness of breath. 18 g 2  . albuterol (PROVENTIL HFA;VENTOLIN HFA) 108 (90 Base) MCG/ACT inhaler INHALE 1 TO 2 PUFFS INTO THE LUNGS EVERY 6 HOURS AS NEEDED FOR WHEEZING OR SHORTNESS OF BREATH,USE 15 MINUTES PRIOR TO ACTIVITY 8.5 g 6  . amLODipine (NORVASC) 10 MG tablet Take 1 tablet (10 mg total) by mouth daily. 30 tablet 3  . aspirin 81 MG chewable tablet Chew 81 mg by mouth daily.    [name] atorvastatin (LIPITOR) 40 MG tablet Take 1 tablet (40 mg total) by mouth daily. 30 tablet 3  . gabapentin (NEURONTIN) 300 MG capsule Take 2 capsules (600 mg total) by mouth 3 (three) times daily. 180 capsule 3  . hydrOXYzine (ATARAX/VISTARIL) 25 MG tablet Take 1 tablet (25 mg total) by mouth at bedtime as needed (headaches). 30 tablet 2  . meclizine (ANTIVERT) 12.5 MG tablet TAKE 1 TABLET BY MOUTH THREE TIMES DAILY AS NEEDED FOR DIZZINESS 30 tablet 0  . omeprazole (PRILOSEC) 20 MG capsule Take 1 capsule (20 mg total) by mouth daily. 30 capsule 3  . sildenafil (VIAGRA) 25 MG tablet Take 2 tablets (  50 mg total) by mouth daily as needed for erectile dysfunction. 30 tablet 0  . tiZANidine (ZANAFLEX) 4 MG tablet Take 1 tablet (4 mg total) by mouth at bedtime as needed for muscle spasms. 30 tablet 0  . verapamil (CALAN) 120 MG tablet Take 1 tablet (120 mg total) by mouth 3 (three) times daily. 90 tablet 3   No current facility-administered medications on file prior to visit.     ALLERGIES: Allergies  Allergen Reactions  .  Codeine Phosphate Other (See Comments)    REACTION: breathing difficulty  . Penicillins Rash and Other (See Comments)    Break out in sweats/ Rash amoxicillin. Denies airway involvement Has patient had a PCN reaction causing immediate rash, facial/tongue/throat swelling, SOB or lightheadedness with hypotension: Yes Has patient had a PCN reaction causing severe rash involving mucus membranes or skin necrosis: Yes Has patient had a PCN reaction that required hospitalization No Has patient had a PCN reaction occurring within the last 10 years: No If all of the above answers are "NO", then may proceed with Cephalosporin u    FAMILY HISTORY: Family History  Problem Relation Age of Onset  . Anemia Mother   . Hypertension Daughter   . Diabetes Daughter   . Migraines Daughter     SOCIAL HISTORY: Social History   Socioeconomic History  . Marital status: Legally Separated    Spouse name: Not on file  . Number of children: Not on file  . Years of education: Not on file  . Highest education level: Not on file  Occupational History  . Not on file  Social Needs  . Financial resource strain: Not on file  . Food insecurity:    Worry: Not on file    Inability: Not on file  . Transportation needs:    Medical: Not on file    Non-medical: Not on file  Tobacco Use  . Smoking status: Light Tobacco Smoker    Packs/day: 0.25    Years: 53.00    Pack years: 13.25    Types: Cigars    Start date: 09/29/1962    Last attempt to quit: 08/04/2016    Years since quitting: 1.6  . Smokeless tobacco: Former Systems developer    Quit date: 03/08/2017  . Tobacco comment: 2-3 x a week  Substance and Sexual Activity  . Alcohol use: No    Comment: Last drink "30 years ago"  . 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   [name] Sexual activity: Yes    Birth control/protection: Condom    Comment: Multiple sex partners. 60 in last year.  Lifestyle  . Physical activity:    Days per week:  Not on file    Minutes per session: Not on file  . Stress: Not on file  Relationships  . Social connections:    Talks on phone: Not on file    Gets together: Not on file    Attends religious service: Not on file    Active member of club or organization: Not on file    Attends meetings of clubs or organizations: Not on file    Relationship status: Not on file  . Intimate partner violence:    Fear of current or ex partner: Not on file    Emotionally abused: Not on file    Physically abused: Not on file    Forced sexual activity: Not on file  Other Topics Concern  . Not on file  Social History Narrative   Pt lives alone. No stairs in home. Pt has completed 8th grade.     REVIEW OF SYSTEMS: Constitutional: No fevers, chills, or sweats, no generalized fatigue, change in appetite Eyes: No visual changes, double vision, eye pain Ear, nose and throat: No hearing loss, ear pain, nasal congestion, sore throat Cardiovascular: No chest pain, palpitations Respiratory:  No shortness of breath at rest or with exertion, wheezes GastrointestinaI: No nausea, vomiting, diarrhea, abdominal pain, fecal incontinence Genitourinary:  No dysuria, urinary retention or frequency Musculoskeletal:  No neck pain, back pain Integumentary: No rash, pruritus, skin lesions Neurological: as above Psychiatric: No depression, insomnia, anxiety Endocrine: No palpitations, fatigue, diaphoresis, mood swings, change in appetite, change in weight, increased thirst Hematologic/Lymphatic:  No purpura, petechiae. Allergic/Immunologic: no itchy/runny eyes, nasal congestion, recent allergic reactions, rashes  PHYSICAL EXAM: Vitals:   04/12/18 1532  BP: 120/72  Pulse: (!) 55  SpO2: 98%   General: No acute distress.  Patient appears well-groomed.  normal body habitus. Head:  Normocephalic/atraumatic Eyes:  Fundi examined but not visualized Neck: supple, no paraspinal tenderness, full range of motion Heart:  Regular  rate and rhythm Lungs:  Clear to auscultation bilaterally Back: No paraspinal tenderness Neurological Exam: alert and oriented to person, place, and time. Attention span and concentration intact, recent and remote memory intact, fund of knowledge intact.  Speech fluent and not dysarthric, language intact.  CN II-XII intact. Bulk and tone normal, muscle strength 5/5 throughout.  Sensation to light touch  intact.  Deep tendon reflexes 2+ throughout.  Finger to nose testing intact.  Gait normal, Romberg negative.  IMPRESSION: Migraine without aura, without status migrainosus, not intractable  PLAN: 1.  Continue topiramate 100mg  twice daily 2.  Sumatriptan as needed/directed 3.  Limit pain relievers to no more than 2 days out of week to prevent rebound headache 4.  Headache diary 5.  Follow up in 8 months.  Metta Clines, DO  CC: Sherene Sires, DO

## 2018-04-12 NOTE — Patient Instructions (Signed)
Continue topiramate 100mg  twice daily (refilled) Sumatriptan refilled Follow up in 8 months

## 2018-04-21 ENCOUNTER — Ambulatory Visit: Payer: Medicare Other | Admitting: Cardiovascular Disease

## 2018-04-22 ENCOUNTER — Ambulatory Visit (INDEPENDENT_AMBULATORY_CARE_PROVIDER_SITE_OTHER): Payer: Medicare Other | Admitting: Physician Assistant

## 2018-04-22 ENCOUNTER — Encounter: Payer: Self-pay | Admitting: Physician Assistant

## 2018-04-22 VITALS — BP 134/68 | HR 76 | Ht 67.0 in | Wt 168.0 lb

## 2018-04-22 DIAGNOSIS — I2510 Atherosclerotic heart disease of native coronary artery without angina pectoris: Secondary | ICD-10-CM | POA: Diagnosis not present

## 2018-04-22 DIAGNOSIS — I739 Peripheral vascular disease, unspecified: Secondary | ICD-10-CM | POA: Diagnosis not present

## 2018-04-22 DIAGNOSIS — E785 Hyperlipidemia, unspecified: Secondary | ICD-10-CM

## 2018-04-22 DIAGNOSIS — R42 Dizziness and giddiness: Secondary | ICD-10-CM | POA: Diagnosis not present

## 2018-04-22 DIAGNOSIS — I10 Essential (primary) hypertension: Secondary | ICD-10-CM | POA: Diagnosis not present

## 2018-04-22 NOTE — Progress Notes (Signed)
Cardiology Office Note    Date:  04/23/2018   ID:  Keimari , DOB [birth year], MRN [id number]  PCP:  Rutherford Guys, MD  Cardiologist:  Dr. Oval Linsey   Chief Complaint  Patient presents with  . Follow-up    seen for Dr. Oval Linsey. Dizziness  . Dizziness    History of Present Illness:  Jaime Barnes is a 67 y.o. male with HTN, HLD, nonobstructive CAD, and moderate PAD. He previously had a left heart cath in October 2005 that revealed 40% LAD, 20% to 40% RCA, 30% left circumflex disease, EF 50%. He was evaluated by his PCP in August 2016 with symptoms concerning for claudication. He had ABI testing that was negative for occlusive disease. He was referred for exercise ABIs that showed 30-49% SFA disease without focal stenosis. He was last seen by Dr. Oval Linsey on 03/30/2017, due to worsening renal function, he is HCTZ/lisinopril was discontinued and he was started on 10 mg daily of amlodipine.  I last saw the patient on 04/29/2017, he was doing well at the time.  Renal function was stable.  Patient was seen by family medicine on 03/19/2018 for dizziness.  According to the patient, he has dizziness roughly 1-2 times per month.  The last time he had a dizzy spell was last month.  He says his dizzy spell does not associated with change in the body positions.  The last episode occurred when he was walking back from a store and suddenly feel very dizzy where he has to sit down.  He says laying down usually help with his dizziness.  He has been keeping track of his blood pressure, however forgot to bring his blood pressure diary with him today.  I do recommend him to continue to observe for recurrence of dizziness and check to see if he has significant drop in the blood pressure during the spell.  Otherwise, he drink 6-8 bottles of the 16 ounce fluid per day.  Despite drinking such large quantity of fluid per day, he is also urinating constantly.  He does not have any prior diagnosis of diabetes insipidus.  At  this point, we cannot completely rule out vasovagal episodes.  Although he does not have any frank syncope associated with it.  According to patient, he did have a orthostatic vital signs obtained during the last office visit by his primary care provider.  I recommended a carotid ultrasound and a 30-day event monitor as initial work-up.  If symptoms still persist, we can consider a tilt table test by Dr. Caryl Comes, although such testing may not prevent a recurrence of symptom.     Past Medical History:  Diagnosis Date  . Barrett's esophagus 2011   Recommend repeat EGD for surveillance in 2 yrs-[facility]  . Bipolar disorder ([place])   . Chronic kidney disease   . Cirrhosis ([place])   . Diverticulosis of colon   . GANGLION CYST, TENDON SHEATH 06/26/2010  . Hyperlipidemia   . Hypertension   . Impotence   . Migraine   . Pancreatitis   . Pulmonary nodule   . Pulmonary nodule seen on imaging study 02/06/2011   Followup CT in December 2014 with 20 months of stability at 4 mm.  No further imaging recommended     Past Surgical History:  Procedure Laterality Date  . CARDIAC CATHETERIZATION  2005   Nonobstructive, <40%  . TONSILLECTOMY Bilateral 1961    Current Medications: Outpatient Medications Prior to Visit  Medication Sig Dispense Refill  .  albuterol (PROVENTIL HFA;VENTOLIN HFA) 108 (90 Base) MCG/ACT inhaler Inhale 2 puffs into the lungs every 6 (six) hours as needed for wheezing or shortness of breath. 18 g 2  . albuterol (PROVENTIL HFA;VENTOLIN HFA) 108 (90 Base) MCG/ACT inhaler INHALE 1 TO 2 PUFFS INTO THE LUNGS EVERY 6 HOURS AS NEEDED FOR WHEEZING OR SHORTNESS OF BREATH,USE 15 MINUTES PRIOR TO ACTIVITY 8.5 g 6  . amLODipine (NORVASC) 10 MG tablet Take 1 tablet (10 mg total) by mouth daily. 30 tablet 3  . aspirin 81 MG chewable tablet Chew 81 mg by mouth daily.    [name] atorvastatin (LIPITOR) 40 MG tablet Take 1 tablet (40 mg total) by mouth daily. 30 tablet 3  . gabapentin (NEURONTIN) 300 MG  capsule Take 2 capsules (600 mg total) by mouth 3 (three) times daily. 180 capsule 3  . hydrOXYzine (ATARAX/VISTARIL) 25 MG tablet Take 1 tablet (25 mg total) by mouth at bedtime as needed (headaches). 30 tablet 2  . meclizine (ANTIVERT) 12.5 MG tablet TAKE 1 TABLET BY MOUTH THREE TIMES DAILY AS NEEDED FOR DIZZINESS 30 tablet 0  . omeprazole (PRILOSEC) 20 MG capsule Take 1 capsule (20 mg total) by mouth daily. 30 capsule 3  . sildenafil (VIAGRA) 25 MG tablet Take 2 tablets (50 mg total) by mouth daily as needed for erectile dysfunction. 30 tablet 0  . SUMAtriptan (IMITREX) 100 MG tablet TAKE 1 TABLET BY MOUTH, MAY REPEAT ONCE IN 2 HOURS IF HEADACHE PERSISTS OR RECURS. DO NOT EXCEED 2 TABLETS IN 24 HOURS 10 tablet 7  . tiZANidine (ZANAFLEX) 4 MG tablet Take 1 tablet (4 mg total) by mouth at bedtime as needed for muscle spasms. 30 tablet 0  . topiramate (TOPAMAX) 100 MG tablet Take 1 tablet (100 mg total) by mouth 2 (two) times daily. 60 tablet 7  . verapamil (CALAN) 120 MG tablet Take 1 tablet (120 mg total) by mouth 3 (three) times daily. 90 tablet 3   No facility-administered medications prior to visit.      Allergies:   Codeine phosphate and Penicillins   Social History   Socioeconomic History  . Marital status: Legally Separated    Spouse name: Not on file  . Number of children: Not on file  . Years of education: Not on file  . Highest education level: Not on file  Occupational History  . Not on file  Social Needs  . Financial resource strain: Not on file  . Food insecurity:    Worry: Not on file    Inability: Not on file  . Transportation needs:    Medical: Not on file    Non-medical: Not on file  Tobacco Use  . Smoking status: Light Tobacco Smoker    Packs/day: 0.25    Years: 53.00    Pack years: 13.25    Types: Cigars    Start date: 09/29/1962    Last attempt to quit: 08/04/2016    Years since quitting: 1.7  . Smokeless tobacco: Former Systems developer    Quit date: 03/08/2017  .  Tobacco comment: 2-3 x a week  Substance and Sexual Activity  . Alcohol use: No    Comment: Last drink "30 years ago"  . 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   [name] Sexual activity: Yes    Birth control/protection: Condom    Comment: Multiple sex partners. 60 in last year.  Lifestyle  . Physical activity:    Days per  week: Not on file    Minutes per session: Not on file  . Stress: Not on file  Relationships  . Social connections:    Talks on phone: Not on file    Gets together: Not on file    Attends religious service: Not on file    Active member of club or organization: Not on file    Attends meetings of clubs or organizations: Not on file    Relationship status: Not on file  Other Topics Concern  . Not on file  Social History Narrative   Pt lives alone. No stairs in home. Pt has completed 8th grade.      Family History:  The patient's family history includes Anemia in his mother; Diabetes in his daughter; Hypertension in his daughter; Migraines in his daughter.   ROS:   Please see the history of present illness.    ROS All other systems reviewed and are negative.   PHYSICAL EXAM:   VS:  BP 134/68 (BP Location: Left Arm, Patient Position: Sitting, Cuff Size: Normal)   Pulse 76   Ht 5\' 7"  (1.702 m)   Wt 168 lb (76.2 kg)   BMI 26.31 kg/m    GEN: Well nourished, well developed, in no acute distress  HEENT: normal  Neck: no JVD, carotid bruits, or masses Cardiac: RRR; no murmurs, rubs, or gallops,no edema  Respiratory:  clear to auscultation bilaterally, normal work of breathing GI: soft, nontender, nondistended, + BS MS: no deformity or atrophy  Skin: warm and dry, no rash Neuro:  Alert and Oriented x 3, Strength and sensation are intact Psych: euthymic mood, full affect  Wt Readings from Last 3 Encounters:  04/22/18 168 lb (76.2 kg)  04/12/18 166 lb (75.3 kg)  03/19/18 165 lb (74.8 kg)      Studies/Labs Reviewed:    EKG:  EKG is ordered today.  The ekg ordered today demonstrates normal sinus rhythm, single PAC.  Otherwise no significant ST-T wave changes  Recent Labs: 01/08/2018: Hemoglobin 13.6; Platelets 254 03/19/2018: ALT 16; BUN 14; Creatinine, Ser 1.48; Potassium 3.4; Sodium 138   Lipid Panel    Component Value Date/Time   CHOL 111 03/19/2018 1633   TRIG 145 03/19/2018 1633   HDL 36 (L) 03/19/2018 1633   CHOLHDL 3.1 03/19/2018 1633   CHOLHDL 3.8 07/16/2015 1522   VLDL 37 (H) 07/16/2015 1522   LDLCALC 46 03/19/2018 1633   LDLDIRECT 156 (H) 06/09/2011 1555    Additional studies/ records that were reviewed today include:   Echo 04/23/2015 LV EF: 55% -   60% Study Conclusions  - Left ventricle: The cavity size was normal. Wall thickness was   increased in a pattern of mild LVH. Systolic function was normal.   The estimated ejection fraction was in the range of 55% to 60%.   Wall motion was normal; there were no regional wall motion   abnormalities. Features are consistent with a pseudonormal left   ventricular filling pattern, with concomitant abnormal relaxation   and increased filling pressure (grade 2 diastolic dysfunction). - Aortic valve: There was no stenosis. - Mitral valve: Mildly calcified annulus. Normal thickness leaflets   . There was trivial regurgitation. - Left atrium: The atrium was moderately dilated. - Right ventricle: The cavity size was normal. Systolic function   was normal. - Right atrium: The atrium was mildly dilated. - Tricuspid valve: Peak RV-RA gradient (S): 34 mm Hg. - Pulmonary arteries: PA peak pressure: 37 mm Hg (S). -  Inferior vena cava: The vessel was normal in size. The   respirophasic diameter changes were in the normal range (= 50%),   consistent with normal central venous pressure.  Impressions:  - Normal LV size with mild LV hypertrophy, EF 55-60%. Moderate   diastolic dysfunction. Normal RV size and systolic function. No   significant  valvular abnormalities. Biatrial enlargement. Mild   pulmonary hypertension.   ASSESSMENT:    1. Dizziness   2. Essential hypertension   3. Hyperlipidemia, unspecified hyperlipidemia type   4. Coronary artery disease involving native coronary artery of native heart without angina pectoris   5. PAD (peripheral artery disease) (HCC)      PLAN:  In order of problems listed above:  1. Dizziness: Does not appears to be orthostatic in nature.  Occurs spontaneously and symptom is alleviated by lying flat for 20 minutes.  This may be correlated with a drop in the blood pressure or it could be due to arrhythmia.  He denies any feeling of passing out.  The symptom has been going on for several years now.  The only time he feels he almost passed out was over 2 years ago.  Patient has been monitoring his blood pressure however did not bring his BP diary.  The symptom seems to be quite rare, last episode was 1 month ago.  I recommended carotid ultrasound and a 30-day event monitor as initial work-up.  Primary care provider recommended tilt table study, this can be considered later, although I am not sure if it will change treatment plan.  2. CAD: Denies any chest pain.  On aspirin and statin  3. Hypertension: Blood pressure stable  4. Hyperlipidemia: On Lipitor 40 mg daily  5. PAD: Continue to have leg pain, has upcoming ABI and vascular ultrasound pending.    Medication Adjustments/Labs and Tests Ordered: Current medicines are reviewed at length with the patient today.  Concerns regarding medicines are outlined above.  Medication changes, Labs and Tests ordered today are listed in the Patient Instructions below. Patient Instructions  Medication Instructions:  Your physician recommends that you continue on your current medications as directed. Please refer to the Current Medication list given to you today.  Labwork: None   Testing/Procedures: Your physician has requested that you have a  carotid duplex. This test is an ultrasound of the carotid arteries in your neck. It looks at blood flow through these arteries that supply the brain with blood. Allow one hour for this exam. There are no restrictions or special instructions. [facility]   Your physician has recommended that you wear an 30 day event monitor. Event monitors are medical devices that record the heart's electrical activity. Doctors most often [place] these monitors to diagnose arrhythmias. Arrhythmias are problems with the speed or rhythm of the heartbeat. The monitor is a small, portable device. You can wear one while you do your normal daily activities. This is usually used to diagnose what is causing palpitations/syncope (passing out). [facility] recommends that you schedule a follow-up appointment in: 2-3 MONTHS WITH DR Augusta. Any Other Special Instructions Will Be Listed Below (If Applicable). If you need a refill on your cardiac medications before your next appointment, please call your pharmacy.     Hilbert Corrigan, [place]  04/23/2018 1:08 PM    [facility] [address], [place], [place]  [address] Phone: [phone]; Fax: [phone]

## 2018-04-22 NOTE — Patient Instructions (Signed)
Medication Instructions:  Your physician recommends that you continue on your current medications as directed. Please refer to the Current Medication list given to you today.  Labwork: None   Testing/Procedures: Your physician has requested that you have a carotid duplex. This test is an ultrasound of the carotid arteries in your neck. It looks at blood flow through these arteries that supply the brain with blood. Allow one hour for this exam. There are no restrictions or special instructions. [facility]   Your physician has recommended that you wear an 30 day event monitor. Event monitors are medical devices that record the heart's electrical activity. Doctors most often [place] these monitors to diagnose arrhythmias. Arrhythmias are problems with the speed or rhythm of the heartbeat. The monitor is a small, portable device. You can wear one while you do your normal daily activities. This is usually used to diagnose what is causing palpitations/syncope (passing out). [facility] recommends that you schedule a follow-up appointment in: 2-3 MONTHS WITH DR Landa. Any Other Special Instructions Will Be Listed Below (If Applicable). If you need a refill on your cardiac medications before your next appointment, please call your pharmacy.

## 2018-04-23 ENCOUNTER — Encounter: Payer: Self-pay | Admitting: Physician Assistant

## 2018-04-23 ENCOUNTER — Telehealth: Payer: Self-pay | Admitting: Family Medicine

## 2018-04-23 NOTE — Telephone Encounter (Signed)
Copied from [place] [id number]. Topic: General - Other >> Apr 23, 2018  9:50 AM Judyann Munson wrote: Reason for CRM: patient is calling in regards to his referral to cardiology . He stated he would like call back in regards to what he needs to do next. Please advise   Pt was seen at [facility] on 04-22-18

## 2018-04-23 NOTE — Telephone Encounter (Signed)
I left pt a vm stating that after he see the cardiologist - his next appointment is 06/25/18  With our office but  if he need to be seen before then to please give our office a call.

## 2018-04-28 ENCOUNTER — Ambulatory Visit (INDEPENDENT_AMBULATORY_CARE_PROVIDER_SITE_OTHER): Payer: Medicare Other

## 2018-04-28 DIAGNOSIS — R42 Dizziness and giddiness: Secondary | ICD-10-CM | POA: Diagnosis not present

## 2018-04-30 ENCOUNTER — Ambulatory Visit (HOSPITAL_COMMUNITY)
Admission: RE | Admit: 2018-04-30 | Discharge: 2018-04-30 | Disposition: A | Discharge status: Home / Self Care | Payer: Medicare Other | Source: Ambulatory Visit | Attending: Cardiology | Admitting: Cardiology

## 2018-04-30 DIAGNOSIS — R42 Dizziness and giddiness: Secondary | ICD-10-CM | POA: Diagnosis not present

## 2018-05-02 NOTE — Progress Notes (Signed)
Carotid ultrasound only showed mild disease, no evidence of blockage that can contribute to his dizziness

## 2018-05-03 ENCOUNTER — Telehealth: Payer: Self-pay | Admitting: *Deleted

## 2018-05-03 DIAGNOSIS — R42 Dizziness and giddiness: Secondary | ICD-10-CM

## 2018-05-03 DIAGNOSIS — I472 Ventricular tachycardia, unspecified: Secondary | ICD-10-CM

## 2018-05-03 DIAGNOSIS — I2510 Atherosclerotic heart disease of native coronary artery without angina pectoris: Secondary | ICD-10-CM

## 2018-05-03 MED ORDER — METOPROLOL SUCCINATE ER 25 MG PO TB24: 25.0000 mg | ORAL_TABLET | Freq: Every day | ORAL | 3 refills | Status: DC

## 2018-05-03 NOTE — Telephone Encounter (Signed)
RECEIVED  FAX FROM  [facility] - STRIP SHOWED 5 BEAT RUN  V-TACH -  RE CORDED ON 05/01/18 AT 11:30 AM  PATIENT STATES HE DOES NT REMEMBER IN SYMPTOMS , STATES THE BATTERY WAS LOW AND   HIS FRIEND  CHARGED IT.   DR Castorland REVIEWED PER ORDER  - CHECKED FOR BMP , MAGNESIUM AND START METOPROLOL SUCC 25 MG QD.  PATIENT AWARE,  PATIENT STATE S HE HAS TO CALL TRANSPORTATION 3 DAYS  PRIOR TO HIM GETTINg RIDE TO HAVE LABS [facility] . PATIENT STATES HE HAS AN APPOINTMENT ON FridayOF THIS WEEK, Monday AND Tuesday of next week the next day will be Wednesday - patient states he will call an have transp. Bring to office  For lab work .

## 2018-05-05 ENCOUNTER — Ambulatory Visit: Payer: Medicare Other | Admitting: Podiatry

## 2018-05-07 DIAGNOSIS — H25812 Combined forms of age-related cataract, left eye: Secondary | ICD-10-CM | POA: Diagnosis not present

## 2018-05-07 DIAGNOSIS — H10413 Chronic giant papillary conjunctivitis, bilateral: Secondary | ICD-10-CM | POA: Diagnosis not present

## 2018-05-07 DIAGNOSIS — H25813 Combined forms of age-related cataract, bilateral: Secondary | ICD-10-CM | POA: Diagnosis not present

## 2018-05-07 DIAGNOSIS — H4089 Other specified glaucoma: Secondary | ICD-10-CM | POA: Diagnosis not present

## 2018-05-07 DIAGNOSIS — H2511 Age-related nuclear cataract, right eye: Secondary | ICD-10-CM | POA: Diagnosis not present

## 2018-05-12 DIAGNOSIS — I472 Ventricular tachycardia: Secondary | ICD-10-CM | POA: Diagnosis not present

## 2018-05-12 DIAGNOSIS — I2510 Atherosclerotic heart disease of native coronary artery without angina pectoris: Secondary | ICD-10-CM | POA: Diagnosis not present

## 2018-05-12 DIAGNOSIS — R42 Dizziness and giddiness: Secondary | ICD-10-CM | POA: Diagnosis not present

## 2018-05-12 LAB — BASIC METABOLIC PANEL
BUN/Creatinine Ratio: 15 (ref 10–24)
BUN: 23 mg/dL (ref 8–27)
CALCIUM: 9.2 mg/dL (ref 8.6–10.2)
CO2: 20 mmol/L (ref 20–29)
Chloride: 105 mmol/L (ref 96–106)
Creatinine, Ser: 1.49 mg/dL — ABNORMAL HIGH (ref 0.76–1.27)
GFR calc Af Amer: 55 mL/min/{1.73_m2} — ABNORMAL LOW (ref >59)
GFR calc non Af Amer: 48 mL/min/{1.73_m2} — ABNORMAL LOW (ref >59)
GLUCOSE: 94 mg/dL (ref 65–99)
POTASSIUM: 3.9 mmol/L (ref 3.5–5.2)
Sodium: 140 mmol/L (ref 134–144)

## 2018-05-12 LAB — MAGNESIUM: MAGNESIUM: 1.4 mg/dL — AB (ref 1.6–2.3)

## 2018-05-18 ENCOUNTER — Encounter: Payer: Self-pay | Admitting: Podiatry

## 2018-05-18 ENCOUNTER — Ambulatory Visit (INDEPENDENT_AMBULATORY_CARE_PROVIDER_SITE_OTHER): Payer: Medicare Other | Admitting: Podiatry

## 2018-05-18 DIAGNOSIS — B351 Tinea unguium: Secondary | ICD-10-CM | POA: Diagnosis not present

## 2018-05-18 DIAGNOSIS — M79676 Pain in unspecified toe(s): Secondary | ICD-10-CM | POA: Diagnosis not present

## 2018-05-18 NOTE — Progress Notes (Signed)
Patient ID: Jaime Barnes, male   DOB: 1950/10/07, 67 y.o.   MRN: [id number] Complaint:  Visit Type: Patient returns to my office for continued preventative foot care services. Complaint: Patient states" my nails have grown long and thick and become painful to walk and wear shoes" . The patient presents for preventative foot care services. No changes to ROS  Podiatric Exam: Vascular: dorsalis pedis and posterior tibial pulses are palpable bilateral. Capillary return is immediate. Temperature gradient is WNL. Skin turgor WNL  Sensorium: Normal Semmes Weinstein monofilament test. Normal tactile sensation bilaterally. Nail Exam: Pt has thick disfigured discolored nails with subungual debris noted bilateral entire nail hallux through fifth toenails Ulcer Exam: There is no evidence of ulcer or pre-ulcerative changes or infection. Orthopedic Exam: Muscle tone and strength are WNL. No limitations in general ROM. No crepitus or effusions noted. Foot type and digits show no abnormalities. Bony prominences are unremarkable. Skin: No Porokeratosis. No infection or ulcers  Diagnosis:  Onychomycosis, , Pain in right toe, pain in left toes  Treatment & Plan Procedures and Treatment: Consent by patient was obtained for treatment procedures. The patient understood the discussion of treatment and procedures well. All questions were answered thoroughly reviewed. Debridement of mycotic and hypertrophic toenails, 1 through 5 bilateral and clearing of subungual debris. No ulceration, no infection noted.   Return Visit-Office Procedure: Patient instructed to return to the office for a follow up visit 10 weeks  for continued evaluation and treatment.    Gardiner Barefoot DPM

## 2018-05-24 ENCOUNTER — Other Ambulatory Visit: Payer: Self-pay | Admitting: Family Medicine

## 2018-05-24 DIAGNOSIS — E785 Hyperlipidemia, unspecified: Secondary | ICD-10-CM

## 2018-05-27 ENCOUNTER — Other Ambulatory Visit: Payer: Self-pay

## 2018-05-27 MED ORDER — MAGNESIUM OXIDE 400 MG PO TABS: 400.0000 mg | ORAL_TABLET | Freq: Every day | ORAL | 2 refills | Status: DC

## 2018-06-25 ENCOUNTER — Encounter: Payer: Self-pay | Admitting: Family Medicine

## 2018-06-25 ENCOUNTER — Other Ambulatory Visit: Payer: Self-pay

## 2018-06-25 ENCOUNTER — Ambulatory Visit (INDEPENDENT_AMBULATORY_CARE_PROVIDER_SITE_OTHER): Payer: Medicare Other | Admitting: Family Medicine

## 2018-06-25 VITALS — BP 152/84 | HR 65 | Temp 98.4°F | Resp 18 | Ht 68.19 in | Wt 173.0 lb

## 2018-06-25 DIAGNOSIS — N529 Male erectile dysfunction, unspecified: Secondary | ICD-10-CM

## 2018-06-25 DIAGNOSIS — N183 Chronic kidney disease, stage 3 unspecified: Secondary | ICD-10-CM

## 2018-06-25 DIAGNOSIS — Z23 Encounter for immunization: Secondary | ICD-10-CM | POA: Diagnosis not present

## 2018-06-25 DIAGNOSIS — R310 Gross hematuria: Secondary | ICD-10-CM | POA: Diagnosis not present

## 2018-06-25 DIAGNOSIS — I10 Essential (primary) hypertension: Secondary | ICD-10-CM

## 2018-06-25 MED ORDER — SILDENAFIL CITRATE 100 MG PO TABS: 50.0000 mg | ORAL_TABLET | Freq: Every day | ORAL | 0 refills | Status: DC | PRN

## 2018-06-25 NOTE — Progress Notes (Signed)
9/27/20193:51 PM  Jaime Barnes [birth year], 67 y.o. male [id number]  Chief Complaint  Patient presents with  . Hypertension    f/u- and lab results    HPI:   Patient is a 67 y.o. male with past medical history significant for HTN, non-obstructive CAD, abnormal ABI, spinal stenosis, and chronic dizziness who presents today for routine followup  Recently saw cardiologist Ordered carotid US and 30 day holter Labs showed low mag and stable but abnormal crt Started on magnesium, since then dizziness resolved  Denies any urinary symptoms except that he had gross hematuria a week ago Lab Results  Component Value Date   CREATININE 1.49 (H) 05/12/2018  former cig smoker, still smokes a cigar a day  Requesting refill of ED meds,  Was using viagra 50mg  but now too expensive at [facility]  Fall Risk  06/25/2018 04/12/2018 03/19/2018 01/08/2018 08/11/2017  Falls in the past year? No No No No No  Number falls in past yr: - - - - -  Injury with Fall? - - - - -     Depression screen [facility] 2/9 06/25/2018 03/19/2018 01/08/2018  Decreased Interest 0 0 0  Down, Depressed, Hopeless 0 0 0  PHQ - 2 Score 0 0 0  Altered sleeping - - -  Tired, decreased energy - - -  Change in appetite - - -  Feeling bad or failure about yourself  - - -  Trouble concentrating - - -  Moving slowly or fidgety/restless - - -  Suicidal thoughts - - -  PHQ-9 Score - - -  Difficult doing work/chores - - -  Some recent data might be hidden    Allergies  Allergen Reactions  . Codeine Phosphate Other (See Comments)    REACTION: breathing difficulty  . Penicillins Rash and Other (See Comments)    Break out in sweats/ Rash amoxicillin. Denies airway involvement Has patient had a PCN reaction causing immediate rash, facial/tongue/throat swelling, SOB or lightheadedness with hypotension: Yes Has patient had a PCN reaction causing severe rash involving mucus membranes or skin necrosis: Yes Has patient had a PCN reaction that  required hospitalization No Has patient had a PCN reaction occurring within the last 10 years: No If all of the above answers are "NO", then may proceed with Cephalosporin u    Prior to Admission medications   Medication Sig Start Date End Date Taking? Authorizing Provider  albuterol (PROVENTIL HFA;VENTOLIN HFA) 108 (90 Base) MCG/ACT inhaler Inhale 2 puffs into the lungs every 6 (six) hours as needed for wheezing or shortness of breath. 01/08/18  Yes Rutherford Guys, MD  albuterol (PROVENTIL HFA;VENTOLIN HFA) 108 (90 Base) MCG/ACT inhaler INHALE 1 TO 2 PUFFS INTO THE LUNGS EVERY 6 HOURS AS NEEDED FOR WHEEZING OR SHORTNESS OF BREATH,USE 15 MINUTES PRIOR TO ACTIVITY 02/25/18  Yes Criss Rosales, Scott, DO  amLODipine (NORVASC) 10 MG tablet Take 1 tablet (10 mg total) by mouth daily. 03/19/18  Yes Rutherford Guys, MD  aspirin 81 MG chewable tablet Chew 81 mg by mouth daily.   Yes [provider]  atorvastatin (LIPITOR) 40 MG tablet Take 1 tablet (40 mg total) by mouth daily. 01/08/18  Yes Rutherford Guys, MD  atorvastatin (LIPITOR) 40 MG tablet TAKE 1 TABLET(40 MG) BY MOUTH DAILY 05/24/18  Yes Rutherford Guys, MD  gabapentin (NEURONTIN) 300 MG capsule Take 2 capsules (600 mg total) by mouth 3 (three) times daily. 01/08/18  Yes Rutherford Guys, MD  hydrOXYzine (ATARAX/VISTARIL) 25  MG tablet Take 1 tablet (25 mg total) by mouth at bedtime as needed (headaches). 01/08/18  Yes Rutherford Guys, MD  magnesium oxide (MAG-OX) 400 MG tablet Take 1 tablet (400 mg total) by mouth daily. 05/27/18  Yes Skeet Latch, MD  meclizine (ANTIVERT) 12.5 MG tablet TAKE 1 TABLET BY MOUTH THREE TIMES DAILY AS NEEDED FOR DIZZINESS 12/30/17  Yes Sherene Sires, DO  metoprolol succinate (TOPROL XL) 25 MG 24 hr tablet Take 1 tablet (25 mg total) by mouth daily. 05/03/18  Yes Skeet Latch, MD  omeprazole (PRILOSEC) 20 MG capsule Take 1 capsule (20 mg total) by mouth daily. 01/08/18  Yes Rutherford Guys, MD  sildenafil (VIAGRA)  25 MG tablet Take 2 tablets (50 mg total) by mouth daily as needed for erectile dysfunction. 01/08/18  Yes Rutherford Guys, MD  SUMAtriptan (IMITREX) 100 MG tablet TAKE 1 TABLET BY MOUTH, MAY REPEAT ONCE IN 2 HOURS IF HEADACHE PERSISTS OR RECURS. DO NOT EXCEED 2 TABLETS IN 24 HOURS 04/12/18  Yes Jaffe, Adam R, DO  tiZANidine (ZANAFLEX) 4 MG tablet Take 1 tablet (4 mg total) by mouth at bedtime as needed for muscle spasms. 01/08/18  Yes Rutherford Guys, MD  topiramate (TOPAMAX) 100 MG tablet Take 1 tablet (100 mg total) by mouth 2 (two) times daily. 04/12/18  Yes Jaffe, Adam R, DO  verapamil (CALAN) 120 MG tablet Take 1 tablet (120 mg total) by mouth 3 (three) times daily. 03/19/18  Yes Rutherford Guys, MD    Past Medical History:  Diagnosis Date  . Barrett's esophagus 2011   Recommend repeat EGD for surveillance in 2 yrs-[facility]  . Bipolar disorder ([place])   . Chronic kidney disease   . Cirrhosis ([place])   . Diverticulosis of colon   . GANGLION CYST, TENDON SHEATH 06/26/2010  . Hyperlipidemia   . Hypertension   . Impotence   . Migraine   . Pancreatitis   . Pulmonary nodule   . Pulmonary nodule seen on imaging study 02/06/2011   Followup CT in December 2014 with 20 months of stability at 4 mm.  No further imaging recommended     Past Surgical History:  Procedure Laterality Date  . CARDIAC CATHETERIZATION  2005   Nonobstructive, <40%  . TONSILLECTOMY Bilateral 1961    Social History   Tobacco Use  . Smoking status: Light Tobacco Smoker    Packs/day: 0.25    Years: 53.00    Pack years: 13.25    Types: Cigars    Start date: 09/29/1962    Last attempt to quit: 08/04/2016    Years since quitting: 1.8  . Smokeless tobacco: Former Systems developer    Quit date: 03/08/2017  . Tobacco comment: 2-3 x a week  Substance Use Topics  . Alcohol use: No    Comment: Last drink "30 years ago"    Family History  Problem Relation Age of Onset  . Anemia Mother   . Hypertension Daughter   . Diabetes  Daughter   . Migraines Daughter     Review of Systems  Constitutional: Negative for chills and fever.  Respiratory: Negative for cough and shortness of breath.   Cardiovascular: Negative for chest pain, palpitations and leg swelling.  Gastrointestinal: Negative for abdominal pain, nausea and vomiting.   OBJECTIVE:  Blood pressure (!) 152/84, pulse 65, temperature 98.4 F (36.9 C), temperature source Oral, resp. rate 18, height 5' 8.19" (1.732 m), weight 173 lb (78.5 kg), SpO2 95 %. Body mass index  is 26.16 kg/m.   BP Readings from Last 3 Encounters:  06/25/18 (!) 152/84  04/22/18 134/68  04/12/18 120/72    Physical Exam  Constitutional: He is oriented to person, place, and time. He appears well-developed and well-nourished.  HENT:  Head: Normocephalic and atraumatic.  Mouth/Throat: Oropharynx is clear and moist.  Eyes: Pupils are equal, round, and reactive to light. Conjunctivae and EOM are normal.  Neck: Neck supple.  Cardiovascular: Normal rate and regular rhythm. Exam reveals no gallop and no friction rub.  No murmur heard. Pulmonary/Chest: Effort normal and breath sounds normal. He has no wheezes. He has no rales.  Musculoskeletal: He exhibits no edema.  Neurological: He is alert and oriented to person, place, and time.  Skin: Skin is warm and dry.  Psychiatric: He has a normal mood and affect.  Nursing note and vitals reviewed.   ASSESSMENT and PLAN  1. CKD (chronic kidney disease) stage 3, GFR 30-59 ml/min (HCC) 2. Gross hematuria - Ambulatory referral to Nephrology - Urinalysis, Routine w reflex microscopic  3. Need for prophylactic vaccination and inoculation against influenza - Flu vaccine HIGH DOSE PF (Fluzone High dose)  4. HYPERTENSION, BENIGN SYSTEMIC Slightly above goal today, usually really well controlled, will recheck at next visit, he sees cardiology soon - Care order/instruction:  5. Erectile dysfunction, unspecified Discussed goodrx card,  [facility]. Reviewed r/se/b of medication - sildenafil (VIAGRA) 100 MG tablet; Take 0.5 tablets (50 mg total) by mouth daily as needed for erectile dysfunction.    Return in about 3 months (around 09/24/2018).    Rutherford Guys, MD [facility] [place], [place] [address] Ph.  [phone] Fax [phone]

## 2018-06-25 NOTE — Patient Instructions (Signed)
° ° ° °  If you have lab work done today you will be contacted with your lab results within the next 2 weeks.  If you have not heard from us then please contact us. The fastest way to get your results is to register for My Chart. ° ° °IF you received an x-ray today, you will receive an invoice from [facility]. Please contact [facility] at [phone] with questions or concerns regarding your invoice.  ° °IF you received labwork today, you will receive an invoice from [facility]. Please contact [facility] at [phone] with questions or concerns regarding your invoice.  ° °Our billing staff will not be able to assist you with questions regarding bills from these companies. ° °You will be contacted with the lab results as soon as they are available. The fastest way to get your results is to activate your My Chart account. Instructions are located on the last page of this paperwork. If you have not heard from us regarding the results in 2 weeks, please contact this office. °  ° ° ° °

## 2018-06-26 LAB — URINALYSIS, ROUTINE W REFLEX MICROSCOPIC
Bilirubin, UA: NEGATIVE
Glucose, UA: NEGATIVE
Ketones, UA: NEGATIVE
Leukocytes, UA: NEGATIVE
Nitrite, UA: NEGATIVE
RBC, UA: NEGATIVE
Specific Gravity, UA: 1.026 (ref 1.005–1.030)
Urobilinogen, Ur: 0.2 mg/dL (ref 0.2–1.0)
pH, UA: 5.5 (ref 5.0–7.5)

## 2018-06-26 LAB — MICROSCOPIC EXAMINATION
Bacteria, UA: NONE SEEN
Casts: NONE SEEN /lpf

## 2018-07-20 ENCOUNTER — Encounter: Payer: Self-pay | Admitting: Cardiovascular Disease

## 2018-07-20 ENCOUNTER — Ambulatory Visit (INDEPENDENT_AMBULATORY_CARE_PROVIDER_SITE_OTHER): Payer: Medicare Other | Admitting: Cardiovascular Disease

## 2018-07-20 VITALS — BP 128/72 | HR 77 | Ht 66.0 in | Wt 170.6 lb

## 2018-07-20 DIAGNOSIS — E785 Hyperlipidemia, unspecified: Secondary | ICD-10-CM

## 2018-07-20 DIAGNOSIS — I739 Peripheral vascular disease, unspecified: Secondary | ICD-10-CM | POA: Diagnosis not present

## 2018-07-20 DIAGNOSIS — I2510 Atherosclerotic heart disease of native coronary artery without angina pectoris: Secondary | ICD-10-CM | POA: Diagnosis not present

## 2018-07-20 DIAGNOSIS — R42 Dizziness and giddiness: Secondary | ICD-10-CM

## 2018-07-20 DIAGNOSIS — I10 Essential (primary) hypertension: Secondary | ICD-10-CM | POA: Diagnosis not present

## 2018-07-20 DIAGNOSIS — I472 Ventricular tachycardia: Secondary | ICD-10-CM

## 2018-07-20 DIAGNOSIS — I4729 Other ventricular tachycardia: Secondary | ICD-10-CM

## 2018-07-20 DIAGNOSIS — I493 Ventricular premature depolarization: Secondary | ICD-10-CM | POA: Diagnosis not present

## 2018-07-20 DIAGNOSIS — M79605 Pain in left leg: Secondary | ICD-10-CM

## 2018-07-20 DIAGNOSIS — M79604 Pain in right leg: Secondary | ICD-10-CM

## 2018-07-20 NOTE — Addendum Note (Signed)
Addended by: Alvina Filbert B on: 07/20/2018 11:09 AM   Modules accepted: Orders

## 2018-07-20 NOTE — Patient Instructions (Addendum)
Medication Instructions:  Your physician recommends that you continue on your current medications as directed. Please refer to the Current Medication list given to you today. If you need a refill on your cardiac medications before your next appointment, please call your pharmacy.   Lab work: BMET/MAGNSIUM/CBC/TSH/FT4 TODAY  If you have labs (blood work) drawn today and your tests are completely normal, you will receive your results only by: [name] MyChart Message (if you have MyChart) OR . A paper copy in the mail If you have any lab test that is abnormal or we need to change your treatment, we will call you to review the results.  Testing/Procedures: Your physician has requested that you have a lexiscan myoview. For further information please visit [email or web address]. Please follow instruction sheet, as given.  Your physician has requested that you have an ankle brachial index (ABI). During this test an ultrasound and blood pressure cuff are used to evaluate the arteries that supply the arms and legs with blood. Allow thirty minutes for this exam. There are no restrictions or special instructions.     Follow-Up: At [facility], you and your health needs are our priority.  As part of our continuing mission to provide you with exceptional heart care, we have created designated Provider Care Teams.  These Care Teams include your primary Cardiologist (physician) and Advanced Practice Providers (APPs -  Physician Assistants and Nurse Practitioners) who all work together to provide you with the care you need, when you need it. You will need a follow up appointment in 6 months.  Please call our office 2 months in advance to schedule this appointment.  You may see DR [facility] or one of the following Advanced Practice Providers on your designated Care Team:   Kerin Ransom, PA-C Roby Lofts, [place] . Sande Rives, PA-C  Cardiac Nuclear Scan A cardiac nuclear scan is a test that measures blood  flow to the heart when a person is resting and when he or she is exercising. The test looks for problems such as:  Not enough blood reaching a portion of the heart.  The heart muscle not working normally.  You may need this test if:  You have heart disease.  You have had abnormal lab results.  You have had heart surgery or angioplasty.  You have chest pain.  You have shortness of breath.  In this test, a radioactive dye (tracer) is injected into your bloodstream. After the tracer has traveled to your heart, an imaging device is used to measure how much of the tracer is absorbed by or distributed to various areas of your heart. This procedure is usually done at a hospital and takes 2-4 hours. Tell a health care provider about:  Any allergies you have.  All medicines you are taking, including vitamins, herbs, eye drops, creams, and over-the-counter medicines.  Any problems you or family members have had with the use of anesthetic medicines.  Any blood disorders you have.  Any surgeries you have had.  Any medical conditions you have.  Whether you are pregnant or may be pregnant. What are the risks? Generally, this is a safe procedure. However, problems may occur, including:  Serious chest pain and heart attack. This is only a risk if the stress portion of the test is done.  Rapid heartbeat.  Sensation of warmth in your chest. This usually passes quickly.  What happens before the procedure?  Ask your health care provider about changing or stopping your regular medicines. This is especially  important if you are taking diabetes medicines or blood thinners.  Remove your jewelry on the day of the procedure. What happens during the procedure?  An IV tube will be inserted into one of your veins.  Your health care provider will inject a small amount of radioactive tracer through the tube.  You will wait for 20-40 minutes while the tracer travels through your  bloodstream.  Your heart activity will be monitored with an electrocardiogram (ECG).  You will lie down on an exam table.  Images of your heart will be taken for about 15-20 minutes.  You may be asked to exercise on a treadmill or stationary bike. While you exercise, your heart's activity will be monitored with an ECG, and your blood pressure will be checked. If you are unable to exercise, you may be given a medicine to increase blood flow to parts of your heart.  When blood flow to your heart has peaked, a tracer will again be injected through the IV tube.  After 20-40 minutes, you will get back on the exam table and have more images taken of your heart.  When the procedure is over, your IV tube will be removed. The procedure may vary among health care providers and hospitals. Depending on the type of tracer used, scans may need to be repeated 3-4 hours later. What happens after the procedure?  Unless your health care provider tells you otherwise, you may return to your normal schedule, including diet, activities, and medicines.  Unless your health care provider tells you otherwise, you may increase your fluid intake. This will help flush the contrast dye from your body. Drink enough fluid to keep your urine clear or pale yellow.  It is up to you to get your test results. Ask your health care provider, or the department that is doing the test, when your results will be ready. Summary  A cardiac nuclear scan measures the blood flow to the heart when a person is resting and when he or she is exercising.  You may need this test if you are at risk for heart disease.  Tell your health care provider if you are pregnant.  Unless your health care provider tells you otherwise, increase your fluid intake. This will help flush the contrast dye from your body. Drink enough fluid to keep your urine clear or pale yellow. This information is not intended to replace advice given to you by your  health care provider. Make sure you discuss any questions you have with your health care provider. Document Released: 10/10/2004 Document Revised: 09/17/2016 Document Reviewed: 08/24/2013 Elsevier Interactive Patient Education  2017 [employer].

## 2018-07-20 NOTE — Progress Notes (Signed)
Cardiology Office Note   Date:  07/20/2018   ID:  Jaime Barnes, DOB Nov 28, 1950, MRN [id number]  PCP:  Rutherford Guys, MD  Cardiologist:   Skeet Latch, MD   Chief Complaint  Patient presents with  . Follow-up    Pt states no Sx.      History of Present Illness: Jaime Barnes is a 67 y.o. male with hypertension, hyperlipidemia non-obstructive CAD, and moderate PAD who presents for follow up.  Mr. Reap was evaluated by his PCP's office on 04/2015 with symptoms concerning for claudication.  He reports one month of leg cramping and pain when walking up an incline. He has no pain when walking down hill or on flat land.  He underwent ABI testing that was negative for occlusive disease.  He was referred to cardiology due to suspicion that this was a false negative.  He was referred for exercise ABIs that showed 30-49% SFA disease without focal stenosis.  Mr. Kinzler had a [place] 06/2004 that revealed 40% LAD, [name]  LVEF was 50%.   Mr. Fly HCTZ-Lisinopril was increased on 05/06/15.  Since that time he has been feeling well.  He denies chest pain or shortness of breath.  He notes occasional episodes of indigestion that occur at rest.   It improves with Tums.  He notes some pain in his legs with walking that has been attributed to sciatica.  He walks for exercise Twice daily he notes occasional marks on his legs from his socks but denies overt edema. He denies orthopnea or PND. He does not have much salt in his diet. This morning he had 4 episodes of nonbloody, nonbilious emesis. He hasn't had any sick contacts. He is feeling well and currently denies any abdominal pain or nausea.  At his last appointment HCTZ-lisinopril was switched to amlodipine 2/2 worsening renal function.  He followed up with Almyra Deforest, PA on 04/2017 and was doing well.  He again saw Isaac Laud 04/2018 for dizziness.  He was referred for carotid Dopplers that revealed mild stenosis bilaterally.  He also had a  30-day event monitor that showed sinus rhythm with occasional PACs and PVCs.  He also had a 5 beat run of NSVT.  He reports that he has been feeling well.  His main complaint is pain that radiates down bilateral legs and into his calves.  This occurs when walking up hills and prevents him from playing tennis.  He has not had any chest pain or pressure.  His breathing has been stable.  He continues to have some episodes of mild dizziness that occur less frequently than in the past.  When it happens he lays down and it improves within 15 to 20 minutes.  He typically notices it when he is walking around.  He quit smoking cigarettes since his last appointment.  He now smokes cigars occasionally.  He is hoping to quit this as well.   Past Medical History:  Diagnosis Date  . Barrett's esophagus 2011   Recommend repeat EGD for surveillance in 2 yrs-[facility]  . Bipolar disorder ([place])   . Chronic kidney disease   . Cirrhosis ([place])   . Diverticulosis of colon   . GANGLION CYST, TENDON SHEATH 06/26/2010  . Hyperlipidemia   . Hypertension   . Impotence   . Migraine   . Pancreatitis   . Pulmonary nodule   . Pulmonary nodule seen on imaging study 02/06/2011   Followup CT in December 2014  with 20 months of stability at 4 mm.  No further imaging recommended     Past Surgical History:  Procedure Laterality Date  . CARDIAC CATHETERIZATION  2005   Nonobstructive, <40%  . TONSILLECTOMY Bilateral 1961     Current Outpatient Medications  Medication Sig Dispense Refill  . albuterol (PROVENTIL HFA;VENTOLIN HFA) 108 (90 Base) MCG/ACT inhaler INHALE 1 TO 2 PUFFS INTO THE LUNGS EVERY 6 HOURS AS NEEDED FOR WHEEZING OR SHORTNESS OF BREATH,USE 15 MINUTES PRIOR TO ACTIVITY 8.5 g 6  . amLODipine (NORVASC) 10 MG tablet Take 1 tablet (10 mg total) by mouth daily. 30 tablet 3  . aspirin 81 MG chewable tablet Chew 81 mg by mouth daily.    [name] atorvastatin (LIPITOR) 40 MG tablet Take 1 tablet (40 mg total) by mouth daily.  30 tablet 3  . gabapentin (NEURONTIN) 300 MG capsule Take 2 capsules (600 mg total) by mouth 3 (three) times daily. 180 capsule 3  . hydrOXYzine (ATARAX/VISTARIL) 25 MG tablet Take 1 tablet (25 mg total) by mouth at bedtime as needed (headaches). 30 tablet 2  . magnesium oxide (MAG-OX) 400 MG tablet Take 1 tablet (400 mg total) by mouth daily. 30 tablet 2  . meclizine (ANTIVERT) 12.5 MG tablet TAKE 1 TABLET BY MOUTH THREE TIMES DAILY AS NEEDED FOR DIZZINESS 30 tablet 0  . metoprolol succinate (TOPROL XL) 25 MG 24 hr tablet Take 1 tablet (25 mg total) by mouth daily. 90 tablet 3  . omeprazole (PRILOSEC) 20 MG capsule Take 1 capsule (20 mg total) by mouth daily. 30 capsule 3  . sildenafil (VIAGRA) 100 MG tablet Take 0.5 tablets (50 mg total) by mouth daily as needed for erectile dysfunction. 30 tablet 0  . SUMAtriptan (IMITREX) 100 MG tablet TAKE 1 TABLET BY MOUTH, MAY REPEAT ONCE IN 2 HOURS IF HEADACHE PERSISTS OR RECURS. DO NOT EXCEED 2 TABLETS IN 24 HOURS 10 tablet 7  . tiZANidine (ZANAFLEX) 4 MG tablet Take 1 tablet (4 mg total) by mouth at bedtime as needed for muscle spasms. 30 tablet 0  . topiramate (TOPAMAX) 100 MG tablet Take 1 tablet (100 mg total) by mouth 2 (two) times daily. 60 tablet 7  . verapamil (CALAN) 120 MG tablet Take 1 tablet (120 mg total) by mouth 3 (three) times daily. 90 tablet 3   No current facility-administered medications for this visit.     Allergies:   Codeine phosphate and Penicillins    Social History:  The patient  reports that he has been smoking cigars. He started smoking about 55 years ago. He has a 13.25 pack-year smoking history. He quit smokeless tobacco use about 16 months ago. He reports that he has current or past drug history. Drug: Marijuana. He reports that he does not drink alcohol.   Family History:  The patient's family history includes Anemia in his mother; Diabetes in his daughter; Hypertension in his daughter; Migraines in his daughter.     ROS:  Please see the history of present illness.   Otherwise, review of systems are positive for URI.   All other systems are reviewed and negative.    PHYSICAL EXAM: VS:  BP 128/72   Pulse 77   Ht 5\' 6"  (1.676 m)   Wt 170 lb 9.6 oz (77.4 kg)   BMI 27.54 kg/m  , BMI Body mass index is 27.54 kg/m. GENERAL:  Well appearing HEENT: Pupils equal round and reactive, fundi not visualized, oral mucosa unremarkable NECK:  No jugular venous  distention, waveform within normal limits, carotid upstroke brisk and symmetric, no bruits, no thyromegalyy LUNGS:  Clear to auscultation bilaterally HEART:  Mostly regular with occasional ectopy.  PMI not displaced or sustained,S1 and S2 within normal limits, no S3, no S4, no clicks, no rubs, no murmurs ABD:  Flat, positive bowel sounds normal in frequency in pitch, no bruits, no rebound, no guarding, no midline pulsatile mass, no hepatomegaly, no splenomegaly EXT:  2 plus pulses throughout, no edema, no cyanosis no clubbing SKIN:  No rashes no nodules NEURO:  Cranial nerves II through XII grossly intact, motor grossly intact throughout PSYCH:  Cognitively intact, oriented to person place and time   EKG:  EKG is not ordered today. The ekg ordered 04/30/15 demonstrates sinus bradycardia at 59 bpm. 03/30/12: Sinus rhythm. Rate 73 bpm. PVCs.   TTE 12/03/07: LVEF 55-65%.  Trace MR.  LHC 07/03/04: 40% mid LAD, 30% mid LCx, 40% mid RCA, 20% distal RCA.  LV gram LVEF 50%.  Exercise Myoview 12/03/07: Exercised 7 minutes on Bruce protocol.  8 METS.  Adequate HR response.  No infarct or inducible ischemia.  30 day event monitor 04/28/18: Quality: Fair.  Baseline artifact. Predominant rhythm: sinus rhythm Average heart rate: 73 bpm Max heart rate: 151 bpm Min heart rate: 51 bpm  PACs and PVCs noted 5 beats NSVT  Carotid Doppler 04/30/18: 1-39% ICA stenosis biaterally  Recent Labs: 01/08/2018: Hemoglobin 13.6; Platelets 254 03/19/2018: ALT 16 05/12/2018: BUN  23; Creatinine, Ser 1.49; Magnesium 1.4; Potassium 3.9; Sodium 140    Lipid Panel    Component Value Date/Time   CHOL 111 03/19/2018 1633   TRIG 145 03/19/2018 1633   HDL 36 (L) 03/19/2018 1633   CHOLHDL 3.1 03/19/2018 1633   CHOLHDL 3.8 07/16/2015 1522   VLDL 37 (H) 07/16/2015 1522   LDLCALC 46 03/19/2018 1633   LDLDIRECT 156 (H) 06/09/2011 1555      Wt Readings from Last 3 Encounters:  07/20/18 170 lb 9.6 oz (77.4 kg)  06/25/18 173 lb (78.5 kg)  04/22/18 168 lb (76.2 kg)      Other studies Reviewed: Additional studies/ records that were reviewed today include: . Review of the above records demonstrates:  Please see elsewhere in the note.    ASSESSMENT AND PLAN:  # NSVT:  # PVCs/PACs: Mr. Loy has known non-obstructive CAD.  He had an episode of NSVT monitor.  He is no longer able to exercise due to sciatica and claudication.  We will get an [place] to better assess for ischemia.  He is unable to walk on the treadmill.  We will also check a CBC, CMP, magnesium, and TSH to assess for electrolyte abnormalities as the cause of his NSVT and PVCs.  Continue metoprolol.  # PAD: Mr. Bordas had moderate PAD 05/2015.  Continue aspirin and atorvastatin.  Repeat ABIs.    # HTN: BP well-controlled.  Continue amlodipine, verapamil and metoprolol.   # Hyperlipidemia: LDL 46 02/2018.  Continue atorvastatin.    # CV Disease prevention: Continue ASA 81mg  daily and statin.  # Tobacco cessation: Patient encouraged to quit cigars as well.    Current medicines are reviewed at length with the patient today.  The patient does not have concerns regarding medicines.  The following changes have been made:  no change  Labs/ tests ordered today include:   Orders Placed This Encounter  Procedures  . T4, free  . TSH  . CBC with Differential/Platelet  . Basic metabolic panel  .  Magnesium  . MYOCARDIAL PERFUSION IMAGING     Disposition:   FU with Dr. Jonelle Sidle C. [place] in  6  months.    Signed, Skeet Latch, MD  07/20/2018 11:05 AM    [place]

## 2018-07-21 LAB — BASIC METABOLIC PANEL
BUN/Creatinine Ratio: 10 (ref 10–24)
BUN: 14 mg/dL (ref 8–27)
CO2: 17 mmol/L — ABNORMAL LOW (ref 20–29)
CREATININE: 1.39 mg/dL — AB (ref 0.76–1.27)
Calcium: 9.5 mg/dL (ref 8.6–10.2)
Chloride: 110 mmol/L — ABNORMAL HIGH (ref 96–106)
GFR, EST AFRICAN AMERICAN: 60 mL/min/{1.73_m2} (ref >59)
GFR, EST NON AFRICAN AMERICAN: 52 mL/min/{1.73_m2} — AB (ref >59)
Glucose: 81 mg/dL (ref 65–99)
Potassium: 3.9 mmol/L (ref 3.5–5.2)
Sodium: 142 mmol/L (ref 134–144)

## 2018-07-21 LAB — CBC WITH DIFFERENTIAL/PLATELET
BASOS: 1 %
Basophils Absolute: 0.1 10*3/uL (ref 0.0–0.2)
EOS (ABSOLUTE): 0.1 10*3/uL (ref 0.0–0.4)
EOS: 1 %
HEMATOCRIT: 40.4 % (ref 37.5–51.0)
Hemoglobin: 13.5 g/dL (ref 13.0–17.7)
IMMATURE GRANS (ABS): 0 10*3/uL (ref 0.0–0.1)
Immature Granulocytes: 0 %
Lymphocytes Absolute: 2.6 10*3/uL (ref 0.7–3.1)
Lymphs: 32 %
MCH: 29.7 pg (ref 26.6–33.0)
MCHC: 33.4 g/dL (ref 31.5–35.7)
MCV: 89 fL (ref 79–97)
MONOS ABS: 0.6 10*3/uL (ref 0.1–0.9)
Monocytes: 7 %
NEUTROS PCT: 59 %
Neutrophils Absolute: 4.6 10*3/uL (ref 1.4–7.0)
Platelets: 215 10*3/uL (ref 150–450)
RBC: 4.54 x10E6/uL (ref 4.14–5.80)
RDW: 13 % (ref 12.3–15.4)
WBC: 7.9 10*3/uL (ref 3.4–10.8)

## 2018-07-21 LAB — MAGNESIUM: Magnesium: 2 mg/dL (ref 1.6–2.3)

## 2018-07-21 LAB — T4, FREE: Free T4: 1.09 ng/dL (ref 0.82–1.77)

## 2018-07-21 LAB — TSH: TSH: 2.81 u[IU]/mL (ref 0.450–4.500)

## 2018-07-23 ENCOUNTER — Telehealth (HOSPITAL_COMMUNITY): Payer: Self-pay

## 2018-07-23 NOTE — Telephone Encounter (Signed)
Encounter complete. 

## 2018-07-27 ENCOUNTER — Encounter: Payer: Self-pay | Admitting: Podiatry

## 2018-07-27 ENCOUNTER — Ambulatory Visit (INDEPENDENT_AMBULATORY_CARE_PROVIDER_SITE_OTHER): Payer: Medicare Other | Admitting: Podiatry

## 2018-07-27 DIAGNOSIS — B351 Tinea unguium: Secondary | ICD-10-CM

## 2018-07-27 DIAGNOSIS — M79676 Pain in unspecified toe(s): Secondary | ICD-10-CM

## 2018-07-27 NOTE — Progress Notes (Signed)
Patient ID: Jaime Barnes, male   DOB: 08/28/1951, 67 y.o.   MRN: [id number] Complaint:  Visit Type: Patient returns to my office for continued preventative foot care services. Complaint: Patient states" my nails have grown long and thick and become painful to walk and wear shoes" . The patient presents for preventative foot care services. No changes to ROS  Podiatric Exam: Vascular: dorsalis pedis and posterior tibial pulses are palpable bilateral. Capillary return is immediate. Temperature gradient is WNL. Skin turgor WNL  Sensorium: Normal Semmes Weinstein monofilament test. Normal tactile sensation bilaterally. Nail Exam: Pt has thick disfigured discolored nails with subungual debris noted bilateral entire nail hallux through fifth toenails Ulcer Exam: There is no evidence of ulcer or pre-ulcerative changes or infection. Orthopedic Exam: Muscle tone and strength are WNL. No limitations in general ROM. No crepitus or effusions noted. Foot type and digits show no abnormalities. Bony prominences are unremarkable. Skin: No Porokeratosis. No infection or ulcers  Diagnosis:  Onychomycosis, , Pain in right toe, pain in left toes  Treatment & Plan Procedures and Treatment: Consent by patient was obtained for treatment procedures. The patient understood the discussion of treatment and procedures well. All questions were answered thoroughly reviewed. Debridement of mycotic and hypertrophic toenails, 1 through 5 bilateral and clearing of subungual debris. No ulceration, no infection noted.   Return Visit-Office Procedure: Patient instructed to return to the office for a follow up visit 10 weeks  for continued evaluation and treatment.    Chinedu Agustin DPM 

## 2018-07-28 ENCOUNTER — Ambulatory Visit (HOSPITAL_BASED_OUTPATIENT_CLINIC_OR_DEPARTMENT_OTHER)
Admission: RE | Admit: 2018-07-28 | Discharge: 2018-07-28 | Disposition: A | Discharge status: Home / Self Care | Payer: Medicare Other | Source: Ambulatory Visit | Attending: Cardiovascular Disease | Admitting: Cardiovascular Disease

## 2018-07-28 ENCOUNTER — Ambulatory Visit (HOSPITAL_COMMUNITY)
Admission: RE | Admit: 2018-07-28 | Discharge: 2018-07-28 | Disposition: A | Discharge status: Home / Self Care | Payer: Medicare Other | Source: Ambulatory Visit | Attending: Cardiology | Admitting: Cardiology

## 2018-07-28 DIAGNOSIS — M79605 Pain in left leg: Secondary | ICD-10-CM | POA: Diagnosis not present

## 2018-07-28 DIAGNOSIS — M79604 Pain in right leg: Secondary | ICD-10-CM

## 2018-07-28 DIAGNOSIS — I472 Ventricular tachycardia: Secondary | ICD-10-CM | POA: Diagnosis not present

## 2018-07-28 DIAGNOSIS — I4729 Other ventricular tachycardia: Secondary | ICD-10-CM

## 2018-07-28 DIAGNOSIS — I739 Peripheral vascular disease, unspecified: Secondary | ICD-10-CM

## 2018-07-28 LAB — MYOCARDIAL PERFUSION IMAGING
CHL CUP NUCLEAR SDS: 2
CHL CUP NUCLEAR SRS: 0
CHL CUP NUCLEAR SSS: 2
CSEPPHR: 71 {beats}/min
LV dias vol: 118 mL (ref 62–150)
LV sys vol: 63 mL
Rest HR: 56 {beats}/min
TID: 1.11

## 2018-07-28 MED ORDER — TECHNETIUM TC 99M TETROFOSMIN IV KIT
10.6000 | PACK | Freq: Once | INTRAVENOUS | Status: AC | PRN
  Administered 2018-07-28: 10.6 via INTRAVENOUS
  Filled 2018-07-28: qty 11

## 2018-07-28 MED ORDER — REGADENOSON 0.4 MG/5ML IV SOLN
0.4000 mg | Freq: Once | INTRAVENOUS | Status: AC
  Administered 2018-07-28: 0.4 mg via INTRAVENOUS

## 2018-07-28 MED ORDER — TECHNETIUM TC 99M TETROFOSMIN IV KIT
31.6000 | PACK | Freq: Once | INTRAVENOUS | Status: AC | PRN
  Administered 2018-07-28: 31.6 via INTRAVENOUS
  Filled 2018-07-28: qty 32

## 2018-08-22 ENCOUNTER — Other Ambulatory Visit: Payer: Self-pay | Admitting: Family Medicine

## 2018-08-22 DIAGNOSIS — E785 Hyperlipidemia, unspecified: Secondary | ICD-10-CM

## 2018-08-23 ENCOUNTER — Other Ambulatory Visit: Payer: Self-pay | Admitting: Family Medicine

## 2018-08-24 NOTE — Telephone Encounter (Signed)
Requested medication (s) are due for refill today: yes  Requested medication (s) are on the active medication list: yes  Last refill:  04/29/18  Future visit scheduled: yes  Notes to clinic:  No protocol    Requested Prescriptions  Pending Prescriptions Disp Refills   verapamil (CALAN) 120 MG tablet [Pharmacy Med Name: VERAPAMIL 120MG  TABLETS] 90 tablet 0    Sig: TAKE 1 TABLET(120 MG) BY MOUTH THREE TIMES DAILY     Off-Protocol Failed - 08/23/2018 11:29 AM      Failed - Medication not assigned to a protocol, review manually.      Passed - Valid encounter within last 12 months    Recent Outpatient Visits          2 months ago CKD (chronic kidney disease) stage 3, GFR 30-59 ml/min (HCC)   [facility], Lilia Argue, MD   5 months ago Dizziness   [facility], Lilia Argue, MD   7 months ago Spinal stenosis of lumbar region with neurogenic claudication   [facility], Lilia Argue, MD      Future Appointments            In 1 month Rutherford Guys, MD [facility] at [place], [facility]

## 2018-08-27 ENCOUNTER — Other Ambulatory Visit: Payer: Self-pay | Admitting: Family Medicine

## 2018-08-27 NOTE — Telephone Encounter (Signed)
Pt requesting a 90 day supply of medication. Medication filled today for a 30 day supply.

## 2018-08-28 ENCOUNTER — Other Ambulatory Visit: Payer: Self-pay | Admitting: Cardiovascular Disease

## 2018-09-03 ENCOUNTER — Other Ambulatory Visit: Payer: Self-pay

## 2018-09-03 MED ORDER — MAGNESIUM OXIDE 400 (241.3 MG) MG PO TABS: ORAL_TABLET | ORAL | 0 refills | Status: DC

## 2018-09-21 ENCOUNTER — Other Ambulatory Visit: Payer: Self-pay | Admitting: Family Medicine

## 2018-09-21 DIAGNOSIS — M48062 Spinal stenosis, lumbar region with neurogenic claudication: Secondary | ICD-10-CM

## 2018-09-21 NOTE — Telephone Encounter (Signed)
Requested Prescriptions  Pending Prescriptions Disp Refills  . gabapentin (NEURONTIN) 300 MG capsule [Pharmacy Med Name: GABAPENTIN 300MG  CAPSULES] 180 capsule 0    Sig: TAKE 2 CAPSULES(600 MG) BY MOUTH THREE TIMES DAILY     Neurology: Anticonvulsants - gabapentin Passed - 09/21/2018  8:27 AM      Passed - Valid encounter within last 12 months    Recent Outpatient Visits          2 months ago CKD (chronic kidney disease) stage 3, GFR 30-59 ml/min (HCC)   [facility], Lilia Argue, MD   6 months ago Dizziness   [facility], Lilia Argue, MD   8 months ago Spinal stenosis of lumbar region with neurogenic claudication   [facility], Lilia Argue, MD      Future Appointments            In 1 week Rutherford Guys, MD Primary [facility] at [place], [facility]

## 2018-09-28 ENCOUNTER — Ambulatory Visit: Payer: Medicare Other | Admitting: Family Medicine

## 2018-10-01 ENCOUNTER — Other Ambulatory Visit: Payer: Self-pay | Admitting: Cardiovascular Disease

## 2018-10-13 ENCOUNTER — Encounter: Payer: Self-pay | Admitting: Podiatry

## 2018-10-13 ENCOUNTER — Ambulatory Visit (INDEPENDENT_AMBULATORY_CARE_PROVIDER_SITE_OTHER): Payer: Medicare Other | Admitting: Podiatry

## 2018-10-13 DIAGNOSIS — M79676 Pain in unspecified toe(s): Secondary | ICD-10-CM

## 2018-10-13 DIAGNOSIS — B351 Tinea unguium: Secondary | ICD-10-CM

## 2018-10-13 NOTE — Progress Notes (Signed)
Patient ID: Jaime Barnes, male   DOB: 05/02/1951, 67 y.o.   MRN: [id number] Complaint:  Visit Type: Patient returns to my office for continued preventative foot care services. Complaint: Patient states" my nails have grown long and thick and become painful to walk and wear shoes" . The patient presents for preventative foot care services. No changes to ROS  Podiatric Exam: Vascular: dorsalis pedis and posterior tibial pulses are palpable bilateral. Capillary return is immediate. Temperature gradient is WNL. Skin turgor WNL  Sensorium: Normal Semmes Weinstein monofilament test. Normal tactile sensation bilaterally. Nail Exam: Pt has thick disfigured discolored nails with subungual debris noted bilateral entire nail hallux through fifth toenails Ulcer Exam: There is no evidence of ulcer or pre-ulcerative changes or infection. Orthopedic Exam: Muscle tone and strength are WNL. No limitations in general ROM. No crepitus or effusions noted. Foot type and digits show no abnormalities. Bony prominences are unremarkable. Skin: No Porokeratosis. No infection or ulcers  Diagnosis:  Onychomycosis, , Pain in right toe, pain in left toes  Treatment & Plan Procedures and Treatment: Consent by patient was obtained for treatment procedures. The patient understood the discussion of treatment and procedures well. All questions were answered thoroughly reviewed. Debridement of mycotic and hypertrophic toenails, 1 through 5 bilateral and clearing of subungual debris. No ulceration, no infection noted.   Return Visit-Office Procedure: Patient instructed to return to the office for a follow up visit 10 weeks  for continued evaluation and treatment.    Donyae Kilner DPM 

## 2018-10-15 ENCOUNTER — Other Ambulatory Visit: Payer: Self-pay | Admitting: Gastroenterology

## 2018-10-15 DIAGNOSIS — R1084 Generalized abdominal pain: Secondary | ICD-10-CM | POA: Diagnosis not present

## 2018-10-15 DIAGNOSIS — K7030 Alcoholic cirrhosis of liver without ascites: Secondary | ICD-10-CM

## 2018-10-19 ENCOUNTER — Ambulatory Visit
Admission: RE | Admit: 2018-10-19 | Discharge: 2018-10-19 | Disposition: A | Discharge status: Home / Self Care | Payer: Medicare Other | Source: Ambulatory Visit | Attending: Gastroenterology | Admitting: Gastroenterology

## 2018-10-19 DIAGNOSIS — K7030 Alcoholic cirrhosis of liver without ascites: Secondary | ICD-10-CM

## 2018-10-21 ENCOUNTER — Other Ambulatory Visit: Payer: Self-pay

## 2018-10-21 ENCOUNTER — Encounter: Payer: Self-pay | Admitting: Family Medicine

## 2018-10-21 ENCOUNTER — Ambulatory Visit (INDEPENDENT_AMBULATORY_CARE_PROVIDER_SITE_OTHER): Payer: Medicare Other | Admitting: Family Medicine

## 2018-10-21 ENCOUNTER — Other Ambulatory Visit: Payer: Self-pay | Admitting: Family Medicine

## 2018-10-21 VITALS — BP 130/74 | HR 73 | Temp 98.1°F | Ht 66.0 in | Wt 162.0 lb

## 2018-10-21 DIAGNOSIS — N183 Chronic kidney disease, stage 3 unspecified: Secondary | ICD-10-CM

## 2018-10-21 DIAGNOSIS — E878 Other disorders of electrolyte and fluid balance, not elsewhere classified: Secondary | ICD-10-CM

## 2018-10-21 DIAGNOSIS — I10 Essential (primary) hypertension: Secondary | ICD-10-CM | POA: Diagnosis not present

## 2018-10-21 DIAGNOSIS — E785 Hyperlipidemia, unspecified: Secondary | ICD-10-CM

## 2018-10-21 NOTE — Telephone Encounter (Signed)
Requested medication (s) are due for refill today: yes  Requested medication (s) are on the active medication list: yes    Last refill: 08/27/18  # 90 0 refills  Future visit scheduled no  Notes to clinic:off protocol  Requested Prescriptions  Pending Prescriptions Disp Refills   verapamil (CALAN) 120 MG tablet [Pharmacy Med Name: VERAPAMIL 120MG  TABLETS] 90 tablet 0    Sig: TAKE 1 TABLET(120 MG) BY MOUTH THREE TIMES DAILY     Off-Protocol Failed - 10/21/2018  9:33 AM      Failed - Medication not assigned to a protocol, review manually.      Passed - Valid encounter within last 12 months    Recent Outpatient Visits          3 months ago CKD (chronic kidney disease) stage 3, GFR 30-59 ml/min (HCC)   [facility], Lilia Argue, MD   7 months ago Dizziness   [facility], Lilia Argue, MD   9 months ago Spinal stenosis of lumbar region with neurogenic claudication   [facility], Lilia Argue, MD      Future Appointments            Today Rutherford Guys, MD Primary Care at [place], [facility]

## 2018-10-21 NOTE — Progress Notes (Signed)
1/23/202010:57 AM  Jaime Barnes 01-04-1951, 68 y.o. male [id number]  Chief Complaint  Patient presents with  . Hypertension    having colonoscopy done next month    HPI:   Patient is a 68 y.o. male with past medical history significant for HTN, non-obstructive CAD, abnormal ABI, spinal stenosis, and chronic dizziness who presents today for routine followup  Has seen cards in oct - ordered ABI which was normal and myoview stress test which was low risk Also found to have NSVT, on metoprolol Will be having egd and colonoscopy, [facility]  Checks bp at home, this morning was at goal Has sign cut back on cigar smoking, has smoked only 2 this month, does not smoke cigarettes anymore  Overall he has been feeling well The other day was feeling under the weather, lots of coughing, nasal congestion, nausea, felt a bit dizzy But those sx since then have resolved  Patient Care Team: Rutherford Guys, MD as PCP - General (Family Medicine) Lovena Neighbours, DMD (Dentistry) Skeet Latch, MD as Attending Physician (Cardiology) Wilford Corner, MD as Consulting Physician (Gastroenterology)  Fall Risk  10/21/2018 06/25/2018 04/12/2018 03/19/2018 01/08/2018  Falls in the past year? 0 No No No No  Number falls in past yr: - - - - -  Injury with Fall? - - - - -     Depression screen [facility] 2/9 10/21/2018 06/25/2018 03/19/2018  Decreased Interest 0 0 0  Down, Depressed, Hopeless 0 0 0  PHQ - 2 Score 0 0 0  Altered sleeping - - -  Tired, decreased energy - - -  Change in appetite - - -  Feeling bad or failure about yourself  - - -  Trouble concentrating - - -  Moving slowly or fidgety/restless - - -  Suicidal thoughts - - -  PHQ-9 Score - - -  Difficult doing work/chores - - -  Some recent data might be hidden    Allergies  Allergen Reactions  . Codeine Phosphate Other (See Comments)    REACTION: breathing difficulty  . Penicillins Rash and Other (See Comments)    Break out in sweats/  Rash amoxicillin. Denies airway involvement Has patient had a PCN reaction causing immediate rash, facial/tongue/throat swelling, SOB or lightheadedness with hypotension: Yes Has patient had a PCN reaction causing severe rash involving mucus membranes or skin necrosis: Yes Has patient had a PCN reaction that required hospitalization No Has patient had a PCN reaction occurring within the last 10 years: No If all of the above answers are "NO", then may proceed with Cephalosporin u    Prior to Admission medications   Medication Sig Start Date End Date Taking? Authorizing Provider  albuterol (PROVENTIL HFA;VENTOLIN HFA) 108 (90 Base) MCG/ACT inhaler INHALE 1 TO 2 PUFFS INTO THE LUNGS EVERY 6 HOURS AS NEEDED FOR WHEEZING OR SHORTNESS OF BREATH,USE 15 MINUTES PRIOR TO ACTIVITY 02/25/18  Yes Bland, Scott, DO  amLODipine (NORVASC) 10 MG tablet Take 1 tablet (10 mg total) by mouth daily. 03/19/18  Yes Rutherford Guys, MD  aspirin 81 MG chewable tablet Chew 81 mg by mouth daily.   Yes [provider]  atorvastatin (LIPITOR) 40 MG tablet TAKE 1 TABLET(40 MG) BY MOUTH DAILY 08/23/18  Yes Rutherford Guys, MD  chlorhexidine (PERIDEX) 0.12 % solution  07/14/18  Yes [provider]  clindamycin (CLEOCIN) 300 MG capsule TK 1 C PO Q 6 H 07/14/18  Yes [provider]  gabapentin (NEURONTIN) 300 MG capsule TAKE  2 CAPSULES(600 MG) BY MOUTH THREE TIMES DAILY 09/21/18  Yes Santiago,  M, MD  hydrOXYzine (ATARAX/VISTARIL) 25 MG tablet Take 1 tablet (25 mg total) by mouth at bedtime as needed (headaches). 01/08/18  Yes Santiago,  M, MD  Magnesium Oxide 400 (240 Mg) MG TABS TK 1 T PO D 10/01/18  Yes [provider]  meclizine (ANTIVERT) 12.5 MG tablet TAKE 1 TABLET BY MOUTH THREE TIMES DAILY AS NEEDED FOR DIZZINESS 12/30/17  Yes Bland, Scott, DO  metoprolol succinate (TOPROL XL) 25 MG 24 hr tablet Take 1 tablet (25 mg total) by mouth daily. 05/03/18  Yes Horizon West, Tiffany, MD    omeprazole (PRILOSEC) 20 MG capsule Take 1 capsule (20 mg total) by mouth daily. 01/08/18  Yes Santiago,  M, MD  sildenafil (VIAGRA) 100 MG tablet Take 0.5 tablets (50 mg total) by mouth daily as needed for erectile dysfunction. 06/25/18  Yes Santiago,  M, MD  SUMAtriptan (IMITREX) 100 MG tablet TAKE 1 TABLET BY MOUTH, MAY REPEAT ONCE IN 2 HOURS IF HEADACHE PERSISTS OR RECURS. DO NOT EXCEED 2 TABLETS IN 24 HOURS 04/12/18  Yes Jaffe, Adam R, DO  tiZANidine (ZANAFLEX) 4 MG tablet Take 1 tablet (4 mg total) by mouth at bedtime as needed for muscle spasms. 01/08/18  Yes Santiago,  M, MD  topiramate (TOPAMAX) 100 MG tablet Take 1 tablet (100 mg total) by mouth 2 (two) times daily. 04/12/18  Yes Jaffe, Adam R, DO  verapamil (CALAN) 120 MG tablet TAKE 1 TABLET(120 MG) BY MOUTH THREE TIMES DAILY 08/27/18  Yes Santiago,  M, MD    Past Medical History:  Diagnosis Date  . Barrett's esophagus 2011   Recommend repeat EGD for surveillance in 2 yrs-[facility]  . Bipolar disorder (HCC)   . Chronic kidney disease   . Cirrhosis (HCC)   . Diverticulosis of colon   . GANGLION CYST, TENDON SHEATH 06/26/2010  . Hyperlipidemia   . Hypertension   . Impotence   . Migraine   . Pancreatitis   . Pulmonary nodule   . Pulmonary nodule seen on imaging study 02/06/2011   Followup CT in December 2014 with 20 months of stability at 4 mm.  No further imaging recommended     Past Surgical History:  Procedure Laterality Date  . CARDIAC CATHETERIZATION  2005   Nonobstructive, <40%  . TONSILLECTOMY Bilateral 1961    Social History   Tobacco Use  . Smoking status: Light Tobacco Smoker    Packs/day: 0.25    Years: 53.00    Pack years: 13.25    Types: Cigars    Start date: 09/29/1962    Last attempt to quit: 08/04/2016    Years since quitting: 2.2  . Smokeless tobacco: Former User    Quit date: 03/08/2017  . Tobacco comment: 2-3 x a week  Substance Use Topics  . Alcohol use: No    Comment: Last drink  "30 years ago"    Family History  Problem Relation Age of Onset  . Anemia Mother   . Hypertension Daughter   . Diabetes Daughter   . Migraines Daughter     Review of Systems  Constitutional: Negative for chills and fever.  Respiratory: Negative for cough and shortness of breath.   Cardiovascular: Negative for chest pain, palpitations and leg swelling.  Gastrointestinal: Negative for abdominal pain, nausea and vomiting.     OBJECTIVE:  Blood pressure 130/74, pulse 73, temperature 98.1 F (36.7 C), temperature source Oral, height 5' 6" (1.676   m), weight 162 lb (73.5 kg), SpO2 98 %. Body mass index is 26.15 kg/m.    Physical Exam Vitals signs and nursing note reviewed.  Constitutional:      Appearance: He is well-developed.  HENT:     Head: Normocephalic and atraumatic.  Eyes:     Conjunctiva/sclera: Conjunctivae normal.     Pupils: Pupils are equal, round, and reactive to light.  Neck:     Musculoskeletal: Neck supple.  Cardiovascular:     Rate and Rhythm: Normal rate and regular rhythm.     Heart sounds: No murmur. No friction rub. No gallop.   Pulmonary:     Effort: Pulmonary effort is normal.     Breath sounds: Normal breath sounds. No wheezing or rales.  Skin:    General: Skin is warm and dry.  Neurological:     Mental Status: He is alert and oriented to person, place, and time.     ASSESSMENT and PLAN  1. HYPERTENSION, BENIGN SYSTEMIC Controlled. Continue current regime.  - TSH - Lipid panel - Care order/instruction:  2. CKD (chronic kidney disease) stage 3, GFR 30-59 ml/min (HCC) Rechecking labs today.  - CMP14+EGFR  3. Hyperlipidemia, unspecified hyperlipidemia type Checking labs today, medications will be adjusted as needed.  - Lipid panel  Return in about 3 months (around 01/20/2019).    Rutherford Guys, MD [facility] at [facility] [place], [place] [address] Ph.  [phone] Fax [phone]

## 2018-10-21 NOTE — Patient Instructions (Addendum)
    Please bring in blood pressure cuff at your next visit   If you have lab work done today you will be contacted with your lab results within the next 2 weeks.  If you have not heard from [place] then please contact us. The fastest way to get your results is to register for My Chart.   IF you received an x-ray today, you will receive an invoice from [facility]. Please contact [facility] at [phone] with questions or concerns regarding your invoice.   IF you received labwork today, you will receive an invoice from [place]. Please contact [facility] at [phone] with questions or concerns regarding your invoice.   Our billing staff will not be able to assist you with questions regarding bills from these companies.  You will be contacted with the lab results as soon as they are available. The fastest way to get your results is to activate your My Chart account. Instructions are located on the last page of this paperwork. If you have not heard from [place] regarding the results in 2 weeks, please contact this office.

## 2018-10-22 LAB — LIPID PANEL
Chol/HDL Ratio: 4 ratio (ref 0.0–5.0)
Cholesterol, Total: 123 mg/dL (ref 100–199)
HDL: 31 mg/dL — ABNORMAL LOW (ref >39)
LDL Calculated: 29 mg/dL (ref 0–99)
Triglycerides: 316 mg/dL — ABNORMAL HIGH (ref 0–149)
VLDL Cholesterol Cal: 63 mg/dL — ABNORMAL HIGH (ref 5–40)

## 2018-10-22 LAB — CMP14+EGFR
ALT: 15 IU/L (ref 0–44)
AST: 17 IU/L (ref 0–40)
Albumin/Globulin Ratio: 1.7 (ref 1.2–2.2)
Albumin: 4.7 g/dL (ref 3.8–4.8)
Alkaline Phosphatase: 105 IU/L (ref 39–117)
BUN/Creatinine Ratio: 10 (ref 10–24)
BUN: 13 mg/dL (ref 8–27)
Bilirubin Total: 0.3 mg/dL (ref 0.0–1.2)
CO2: 15 mmol/L — CL (ref 20–29)
Calcium: 9.6 mg/dL (ref 8.6–10.2)
Chloride: 110 mmol/L — ABNORMAL HIGH (ref 96–106)
Creatinine, Ser: 1.32 mg/dL — ABNORMAL HIGH (ref 0.76–1.27)
GFR calc Af Amer: 64 mL/min/{1.73_m2} (ref >59)
GFR calc non Af Amer: 55 mL/min/{1.73_m2} — ABNORMAL LOW (ref >59)
Globulin, Total: 2.8 g/dL (ref 1.5–4.5)
Glucose: 101 mg/dL — ABNORMAL HIGH (ref 65–99)
Potassium: 4.1 mmol/L (ref 3.5–5.2)
Sodium: 141 mmol/L (ref 134–144)
Total Protein: 7.5 g/dL (ref 6.0–8.5)

## 2018-10-22 LAB — TSH: TSH: 2.24 u[IU]/mL (ref 0.450–4.500)

## 2018-10-22 NOTE — Addendum Note (Signed)
Addended by: Rutherford Guys on: 10/22/2018 01:48 PM   Modules accepted: Orders

## 2018-10-30 ENCOUNTER — Other Ambulatory Visit: Payer: Self-pay | Admitting: Family Medicine

## 2018-10-30 ENCOUNTER — Other Ambulatory Visit: Payer: Self-pay | Admitting: Neurology

## 2018-10-30 DIAGNOSIS — E785 Hyperlipidemia, unspecified: Secondary | ICD-10-CM

## 2018-11-10 ENCOUNTER — Ambulatory Visit (INDEPENDENT_AMBULATORY_CARE_PROVIDER_SITE_OTHER): Payer: Medicare Other | Admitting: Family Medicine

## 2018-11-10 DIAGNOSIS — E878 Other disorders of electrolyte and fluid balance, not elsewhere classified: Secondary | ICD-10-CM | POA: Diagnosis not present

## 2018-11-10 LAB — BASIC METABOLIC PANEL
BUN/Creatinine Ratio: 8 — ABNORMAL LOW (ref 10–24)
BUN: 10 mg/dL (ref 8–27)
CO2: 17 mmol/L — ABNORMAL LOW (ref 20–29)
Calcium: 9.4 mg/dL (ref 8.6–10.2)
Chloride: 108 mmol/L — ABNORMAL HIGH (ref 96–106)
Creatinine, Ser: 1.31 mg/dL — ABNORMAL HIGH (ref 0.76–1.27)
GFR calc Af Amer: 64 mL/min/{1.73_m2} (ref >59)
GFR calc non Af Amer: 56 mL/min/{1.73_m2} — ABNORMAL LOW (ref >59)
Glucose: 78 mg/dL (ref 65–99)
Potassium: 3.8 mmol/L (ref 3.5–5.2)
Sodium: 140 mmol/L (ref 134–144)

## 2018-11-10 NOTE — Progress Notes (Signed)
Lab visit

## 2018-11-12 DIAGNOSIS — K640 First degree hemorrhoids: Secondary | ICD-10-CM | POA: Diagnosis not present

## 2018-11-12 DIAGNOSIS — K7460 Unspecified cirrhosis of liver: Secondary | ICD-10-CM | POA: Diagnosis not present

## 2018-11-12 DIAGNOSIS — D123 Benign neoplasm of transverse colon: Secondary | ICD-10-CM | POA: Diagnosis not present

## 2018-11-12 DIAGNOSIS — D124 Benign neoplasm of descending colon: Secondary | ICD-10-CM | POA: Diagnosis not present

## 2018-11-12 DIAGNOSIS — R1084 Generalized abdominal pain: Secondary | ICD-10-CM | POA: Diagnosis not present

## 2018-11-16 DIAGNOSIS — D124 Benign neoplasm of descending colon: Secondary | ICD-10-CM | POA: Diagnosis not present

## 2018-11-16 DIAGNOSIS — D123 Benign neoplasm of transverse colon: Secondary | ICD-10-CM | POA: Diagnosis not present

## 2018-11-23 ENCOUNTER — Telehealth: Payer: Self-pay | Admitting: Family Medicine

## 2018-11-23 NOTE — Telephone Encounter (Signed)
Called and spoke with pt regarding their appt with Dr. Santiago on 4/28. Due to Dr. Santiago being out of the office, I was able to reschedule pt for 4/29 with Dr. Santiago. I advised of time and late policy. Thank you! °

## 2018-12-13 NOTE — Progress Notes (Addendum)
NEUROLOGY FOLLOW UP OFFICE NOTE  Thales Knipple [id number]  HISTORY OF PRESENT ILLNESS: Jaime Barnes is a 68 year old right-handed man with hypertension, bipolar disorder, CKD, tobacco use, Barrett's esophagus and cirrhosis who follows up for migraine.  UPDATE: Intensity:  10/10 Duration:  Briefly (minutes) with sumatriptan.  Had one migraine a month ago that lasted 4 days.  He reports increased emotional stress at the time.  Frequency:  Usually 2 to 3 days per month. Current NSAIDS:  none Current analgesics:  none Current triptans:  sumatriptan 100mg  Current anti-emetic:  none Current muscle relaxants:  tizanidine Current Antihypertensive medications:  Lisinopril 10mg , verapamil 120mg  three times daily Current Antidepressant medications:  none Current Anticonvulsant medications:  topiramate 100mg  twice daily, gabapentin 300mg  twice daily  HISTORY: Onset:Has remote history of migraines but they returned in 2016 after experiencing increased stress related to his ex-girlfriend. Location: Right sided Quality: pounding Initial Intensity: "50"/10 Aura: no Prodrome: no Associated symptoms: Photophobia, phonophobia. No nausea or visual disturbance Initial Duration: Until takes medication Initial Frequency: every other day Triggers: Emotional stress Relieving factors: None Activity: Cannot function  Past abortive medication: ibuprofen, naproxen, Tylenol, Excedrin Past preventative medication: none Other past therapy: none  CT of head from 07/19/15 was unremarkable.  MRI of brain from 12/04/14 showed global atrophy and mild small vessel ischemic changes. 06/23/16:  Sed rate 25  PAST MEDICAL HISTORY: Past Medical History:  Diagnosis Date  . Barrett's esophagus 2011   Recommend repeat EGD for surveillance in 2 yrs-[facility]  . Bipolar disorder ([place])   . Chronic kidney disease   . Cirrhosis ([place])   . Diverticulosis of colon   . GANGLION CYST, TENDON SHEATH  06/26/2010  . Hyperlipidemia   . Hypertension   . Impotence   . Migraine   . Pancreatitis   . Pulmonary nodule   . Pulmonary nodule seen on imaging study 02/06/2011   Followup CT in December 2014 with 20 months of stability at 4 mm.  No further imaging recommended     MEDICATIONS: Current Outpatient Medications on File Prior to Visit  Medication Sig Dispense Refill  . albuterol (PROVENTIL HFA;VENTOLIN HFA) 108 (90 Base) MCG/ACT inhaler INHALE 1 TO 2 PUFFS INTO THE LUNGS EVERY 6 HOURS AS NEEDED FOR WHEEZING OR SHORTNESS OF BREATH,USE 15 MINUTES PRIOR TO ACTIVITY 8.5 g 6  . amLODipine (NORVASC) 10 MG tablet Take 1 tablet (10 mg total) by mouth daily. 30 tablet 3  . aspirin 81 MG chewable tablet Chew 81 mg by mouth daily.    [name] atorvastatin (LIPITOR) 40 MG tablet TAKE 1 TABLET(40 MG) BY MOUTH DAILY 90 tablet 0  . chlorhexidine (PERIDEX) 0.12 % solution   2  . clindamycin (CLEOCIN) 300 MG capsule TK 1 C PO Q 6 H  0  . gabapentin (NEURONTIN) 300 MG capsule TAKE 2 CAPSULES(600 MG) BY MOUTH THREE TIMES DAILY 180 capsule 0  . hydrOXYzine (ATARAX/VISTARIL) 25 MG tablet Take 1 tablet (25 mg total) by mouth at bedtime as needed (headaches). 30 tablet 2  . Magnesium Oxide 400 (240 Mg) MG TABS TK 1 T PO D    . meclizine (ANTIVERT) 12.5 MG tablet TAKE 1 TABLET BY MOUTH THREE TIMES DAILY AS NEEDED FOR DIZZINESS 30 tablet 0  . metoprolol succinate (TOPROL XL) 25 MG 24 hr tablet Take 1 tablet (25 mg total) by mouth daily. 90 tablet 3  . omeprazole (PRILOSEC) 20 MG capsule Take 1 capsule (20 mg total) by mouth daily. 30 capsule 3  .  sildenafil (VIAGRA) 100 MG tablet Take 0.5 tablets (50 mg total) by mouth daily as needed for erectile dysfunction. 30 tablet 0  . SUMAtriptan (IMITREX) 100 MG tablet TAKE 1 TABLET BY MOUTH, MAY REPEAT ONCE IN 2 HOURS IF HEADACHE PERSISTS OR RECURS. DO NOT EXCEED 2 TABLETS IN 24 HOURS 10 tablet 2  . tiZANidine (ZANAFLEX) 4 MG tablet Take 1 tablet (4 mg total) by mouth at bedtime  as needed for muscle spasms. 30 tablet 0  . topiramate (TOPAMAX) 100 MG tablet Take 1 tablet (100 mg total) by mouth 2 (two) times daily. 60 tablet 7  . verapamil (CALAN) 120 MG tablet TAKE 1 TABLET(120 MG) BY MOUTH THREE TIMES DAILY 90 tablet 3   No current facility-administered medications on file prior to visit.     ALLERGIES: Allergies  Allergen Reactions  . Codeine Phosphate Other (See Comments)    REACTION: breathing difficulty  . Penicillins Rash and Other (See Comments)    Break out in sweats/ Rash amoxicillin. Denies airway involvement Has patient had a PCN reaction causing immediate rash, facial/tongue/throat swelling, SOB or lightheadedness with hypotension: Yes Has patient had a PCN reaction causing severe rash involving mucus membranes or skin necrosis: Yes Has patient had a PCN reaction that required hospitalization No Has patient had a PCN reaction occurring within the last 10 years: No If all of the above answers are "NO", then may proceed with Cephalosporin u    FAMILY HISTORY: Family History  Problem Relation Age of Onset  . Anemia Mother   . Hypertension Daughter   . Diabetes Daughter   . Migraines Daughter    SOCIAL HISTORY: Social History   Socioeconomic History  . Marital status: Legally Separated    Spouse name: Not on file  . Number of children: 8  . Years of education: Not on file  . Highest education level: Not on file  Occupational History  . Not on file  Social Needs  . Financial resource strain: Not on file  . Food insecurity:    Worry: Not on file    Inability: Not on file  . Transportation needs:    Medical: Not on file    Non-medical: Not on file  Tobacco Use  . Smoking status: Light Tobacco Smoker    Packs/day: 0.25    Years: 53.00    Pack years: 13.25    Types: Cigars    Start date: 09/29/1962    Last attempt to quit: 08/04/2016    Years since quitting: 2.3  . Smokeless tobacco: Former Systems developer    Quit date: 03/08/2017  . Tobacco  comment: 2-3 x a week  Substance and Sexual Activity  . Alcohol use: No    Comment: Last drink "30 years ago"  . 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   [name] Sexual activity: Yes    Birth control/protection: Condom    Comment: Multiple sex partners. 60 in last year.  Lifestyle  . Physical activity:    Days per week: Not on file    Minutes per session: Not on file  . Stress: Not on file  Relationships  . Social connections:    Talks on phone: Not on file    Gets together: Not on file    Attends religious service: Not on file    Active member of club or organization: Not on file    Attends meetings of clubs or organizations: Not on file  Relationship status: Not on file  . Intimate partner violence:    Fear of current or ex partner: Not on file    Emotionally abused: Not on file    Physically abused: Not on file    Forced sexual activity: Not on file  Other Topics Concern  . Not on file  Social History Narrative   Pt lives alone. No stairs in home. Pt has completed 8th grade.     REVIEW OF SYSTEMS: Constitutional: No fevers, chills, or sweats, no generalized fatigue, change in appetite Eyes: No visual changes, double vision, eye pain Ear, nose and throat: No hearing loss, ear pain, nasal congestion, sore throat Cardiovascular: No chest pain, palpitations Respiratory:  No shortness of breath at rest or with exertion, wheezes GastrointestinaI: No nausea, vomiting, diarrhea, abdominal pain, fecal incontinence Genitourinary:  No dysuria, urinary retention or frequency Musculoskeletal:  No neck pain, back pain Integumentary: No rash, pruritus, skin lesions Neurological: as above Psychiatric: No depression, insomnia, anxiety Endocrine: No palpitations, fatigue, diaphoresis, mood swings, change in appetite, change in weight, increased thirst Hematologic/Lymphatic:  No purpura, petechiae. Allergic/Immunologic: no itchy/runny eyes, nasal  congestion, recent allergic reactions, rashes  PHYSICAL EXAM: Blood pressure 120/70, pulse 95, temperature 98.1 F (36.7 C), temperature source Oral, height 5\' 6"  (1.676 m), weight 168 lb 6 oz (76.4 kg), SpO2 98 %. General: No acute distress.  Patient appears well-groomed.   Head:  Normocephalic/atraumatic Eyes:  Fundi examined but not visualized Neck: supple, no paraspinal tenderness, full range of motion Heart:  Regular rate and rhythm Lungs:  Clear to auscultation bilaterally Back: No paraspinal tenderness Neurological Exam: alert and oriented to person, place, and time. Attention span and concentration intact, recent and remote memory intact, fund of knowledge intact.  Speech fluent and not dysarthric, language intact.  CN II-XII intact. Bulk and tone normal, muscle strength 5/5 throughout.  Sensation to light touch intact.  Deep tendon reflexes 2+ throughout.  Finger to nose and heel to shin testing intact.  Gait normal, Romberg negative.  IMPRESSION: Migraine without aura, without status migrainosus, not intractable  PLAN: 1.  For preventative management, topiramate 100mg  twice daily.  Kidney function carefully followed by nephrology. 2.  Due to age and cardiac history, we will discontinue sumatriptan.  For abortive therapy, he will try Ubrelvy 100mg  3.  Limit use of pain relievers to no more than 2 days out of week to prevent risk of rebound or medication-overuse headache. 4.  Keep headache diary 5.  Exercise, hydration, caffeine cessation, sleep hygiene, monitor for and avoid triggers 6.  Consider:  magnesium citrate 400mg  daily, riboflavin 400mg  daily, and coenzyme Q10 100mg  three times daily 7.  Follow up in 6 months  Metta Clines, DO  CC:  Grant Fontana, MD

## 2018-12-14 ENCOUNTER — Ambulatory Visit (INDEPENDENT_AMBULATORY_CARE_PROVIDER_SITE_OTHER): Payer: Medicare Other | Admitting: Neurology

## 2018-12-14 ENCOUNTER — Encounter: Payer: Self-pay | Admitting: Neurology

## 2018-12-14 ENCOUNTER — Other Ambulatory Visit: Payer: Self-pay

## 2018-12-14 VITALS — BP 120/70 | HR 95 | Temp 98.1°F | Ht 66.0 in | Wt 168.4 lb

## 2018-12-14 DIAGNOSIS — G43009 Migraine without aura, not intractable, without status migrainosus: Secondary | ICD-10-CM | POA: Diagnosis not present

## 2018-12-14 MED ORDER — UBROGEPANT 100 MG PO TABS: 1.0000 | ORAL_TABLET | ORAL | 5 refills | Status: DC | PRN

## 2018-12-14 NOTE — Patient Instructions (Signed)
1.  Continue topiramate 2.  stop Imitrex (sumatriptan).  When you get a migraine, take Ubrelvy 100mg .  May repeat dose once after 2 hours if needed (maximum 2 tablets in 24 hours) 3.  Limit use of pain relievers to no more than 2 days out of week to prevent risk of rebound or medication-overuse headache. 4.  Keep headache diary 5.  Exercise, hydration, caffeine cessation, sleep hygiene, monitor for and avoid triggers 6.  Consider:  magnesium citrate 400mg  daily, riboflavin 400mg  daily, and coenzyme Q10 100mg  three times daily 7.  Follow up in 6 months

## 2018-12-18 ENCOUNTER — Other Ambulatory Visit: Payer: Self-pay | Admitting: Cardiovascular Disease

## 2018-12-27 ENCOUNTER — Telehealth (INDEPENDENT_AMBULATORY_CARE_PROVIDER_SITE_OTHER): Payer: Medicare Other | Admitting: Family Medicine

## 2018-12-27 ENCOUNTER — Other Ambulatory Visit: Payer: Self-pay

## 2018-12-27 DIAGNOSIS — N183 Chronic kidney disease, stage 3 unspecified: Secondary | ICD-10-CM

## 2018-12-27 DIAGNOSIS — M48062 Spinal stenosis, lumbar region with neurogenic claudication: Secondary | ICD-10-CM | POA: Diagnosis not present

## 2018-12-27 DIAGNOSIS — E785 Hyperlipidemia, unspecified: Secondary | ICD-10-CM

## 2018-12-27 MED ORDER — GABAPENTIN 300 MG PO CAPS: ORAL_CAPSULE | ORAL | 5 refills | Status: DC

## 2018-12-27 MED ORDER — METOPROLOL SUCCINATE ER 25 MG PO TB24: 25.0000 mg | ORAL_TABLET | Freq: Every day | ORAL | 3 refills | Status: DC

## 2018-12-27 MED ORDER — AMLODIPINE BESYLATE 10 MG PO TABS: 10.0000 mg | ORAL_TABLET | Freq: Every day | ORAL | 1 refills | Status: DC

## 2018-12-27 MED ORDER — OMEPRAZOLE 20 MG PO CPDR: 20.0000 mg | DELAYED_RELEASE_CAPSULE | Freq: Two times a day (BID) | ORAL | 0 refills | Status: DC

## 2018-12-27 MED ORDER — ATORVASTATIN CALCIUM 40 MG PO TABS: ORAL_TABLET | ORAL | 3 refills | Status: DC

## 2018-12-27 NOTE — Progress Notes (Signed)
Virtual Visit via telephone Note  I connected with patient on 12/27/18 at 1114am  by telephone and verified that I am speaking with the correct person using two identifiers. Jaime Barnes is currently located at home and patient is currently with her during visit. The provider, Rutherford Guys, MD is located in their office at time of visit.  I discussed the limitations, risks, security and privacy concerns of performing an evaluation and management service by telephone and the availability of in person appointments. I also discussed with the patient that there may be a patient responsible charge related to this service. The patient expressed understanding and agreed to proceed.   Telephone visit today for followup on labs  HPI Last OV feb 2020 Has seen neurology and GI since then Had colonoscopy, + polyps, repeat in 5 years He is doing well Needs refills of medications  Lab Results  Component Value Date   CREATININE 1.31 (H) 11/10/2018   BUN 10 11/10/2018   NA 140 11/10/2018   K 3.8 11/10/2018   CL 108 (H) 11/10/2018   CO2 17 (L) 11/10/2018    Fall Risk  12/27/2018 12/14/2018 10/21/2018 06/25/2018 04/12/2018  Falls in the past year? 0 0 0 No No  Number falls in past yr: 0 0 - - -  Injury with Fall? 0 0 - - -  Follow up - Falls evaluation completed - - -     Depression screen [facility] 2/9 12/27/2018 10/21/2018 06/25/2018  Decreased Interest 0 0 0  Down, Depressed, Hopeless 0 0 0  PHQ - 2 Score 0 0 0  Altered sleeping - - -  Tired, decreased energy - - -  Change in appetite - - -  Feeling bad or failure about yourself  - - -  Trouble concentrating - - -  Moving slowly or fidgety/restless - - -  Suicidal thoughts - - -  PHQ-9 Score - - -  Difficult doing work/chores - - -  Some recent data might be hidden    Allergies  Allergen Reactions  . Codeine Phosphate Other (See Comments)    REACTION: breathing difficulty  . Penicillins Rash and Other (See Comments)    Break out in  sweats/ Rash amoxicillin. Denies airway involvement Has patient had a PCN reaction causing immediate rash, facial/tongue/throat swelling, SOB or lightheadedness with hypotension: Yes Has patient had a PCN reaction causing severe rash involving mucus membranes or skin necrosis: Yes Has patient had a PCN reaction that required hospitalization No Has patient had a PCN reaction occurring within the last 10 years: No If all of the above answers are "NO", then may proceed with Cephalosporin u    Prior to Admission medications   Medication Sig Start Date End Date Taking? Authorizing Provider  albuterol (PROVENTIL HFA;VENTOLIN HFA) 108 (90 Base) MCG/ACT inhaler INHALE 1 TO 2 PUFFS INTO THE LUNGS EVERY 6 HOURS AS NEEDED FOR WHEEZING OR SHORTNESS OF BREATH,USE 15 MINUTES PRIOR TO ACTIVITY 02/25/18   Sherene Sires, DO  amLODipine (NORVASC) 10 MG tablet Take 1 tablet (10 mg total) by mouth daily. 03/19/18   Rutherford Guys, MD  aspirin 81 MG chewable tablet Chew 81 mg by mouth daily.    [provider]  atorvastatin (LIPITOR) 40 MG tablet TAKE 1 TABLET(40 MG) BY MOUTH DAILY 11/01/18   Rutherford Guys, MD  chlorhexidine (PERIDEX) 0.12 % solution  07/14/18   [provider]  clindamycin (CLEOCIN) 300 MG capsule TK 1 C PO Q 6  H 07/14/18   [provider]  gabapentin (NEURONTIN) 300 MG capsule TAKE 2 CAPSULES(600 MG) BY MOUTH THREE TIMES DAILY 09/21/18   Rutherford Guys, MD  hydrOXYzine (ATARAX/VISTARIL) 25 MG tablet Take 1 tablet (25 mg total) by mouth at bedtime as needed (headaches). 01/08/18   Rutherford Guys, MD  Magnesium Oxide 400 (240 Mg) MG TABS TK 1 T PO D 10/01/18   [provider]  MAGNESIUM-OXIDE 400 (241.3 Mg) MG tablet TAKE 1 TABLET BY MOUTH EVERY DAY 12/20/18   Skeet Latch, MD  meclizine (ANTIVERT) 12.5 MG tablet TAKE 1 TABLET BY MOUTH THREE TIMES DAILY AS NEEDED FOR DIZZINESS 12/30/17   Sherene Sires, DO  metoprolol succinate (TOPROL XL) 25 MG 24 hr tablet Take  1 tablet (25 mg total) by mouth daily. 05/03/18   Skeet Latch, MD  omeprazole (PRILOSEC) 40 MG capsule TK 1 C PO BID 11/03/18   [provider]  sildenafil (VIAGRA) 100 MG tablet Take 0.5 tablets (50 mg total) by mouth daily as needed for erectile dysfunction. 06/25/18   Rutherford Guys, MD  tiZANidine (ZANAFLEX) 4 MG tablet Take 1 tablet (4 mg total) by mouth at bedtime as needed for muscle spasms. 01/08/18   Rutherford Guys, MD  topiramate (TOPAMAX) 100 MG tablet Take 1 tablet (100 mg total) by mouth 2 (two) times daily. 04/12/18   Tomi Likens, Adam R, DO  Ubrogepant (UBRELVY) 100 MG TABS Take 1 tablet by mouth every 2 (two) hours as needed (once). Maximum 2 tablets in 24 hours 12/14/18   Tomi Likens, Adam R, DO  verapamil (CALAN) 120 MG tablet TAKE 1 TABLET(120 MG) BY MOUTH THREE TIMES DAILY 10/21/18   Rutherford Guys, MD    Past Medical History:  Diagnosis Date  . Barrett's esophagus 2011   Recommend repeat EGD for surveillance in 2 yrs-[facility]  . Bipolar disorder ([place])   . Chronic kidney disease   . Cirrhosis ([place])   . Diverticulosis of colon   . GANGLION CYST, TENDON SHEATH 06/26/2010  . Hyperlipidemia   . Hypertension   . Impotence   . Migraine   . Pancreatitis   . Pulmonary nodule   . Pulmonary nodule seen on imaging study 02/06/2011   Followup CT in December 2014 with 20 months of stability at 4 mm.  No further imaging recommended     Past Surgical History:  Procedure Laterality Date  . CARDIAC CATHETERIZATION  2005   Nonobstructive, <40%  . TONSILLECTOMY Bilateral 1961    Social History   Tobacco Use  . Smoking status: Light Tobacco Smoker    Packs/day: 0.25    Years: 53.00    Pack years: 13.25    Types: Cigars    Start date: 09/29/1962    Last attempt to quit: 08/04/2016    Years since quitting: 2.3  . Smokeless tobacco: Former Systems developer    Quit date: 03/08/2017  . Tobacco comment: 2-3 x a week  Substance Use Topics  . Alcohol use: No    Comment: Last drink "30 years  ago"    Family History  Problem Relation Age of Onset  . Anemia Mother   . Hypertension Daughter   . Diabetes Daughter   . Migraines Daughter     ROS  Per hpi  Objective  Vitals as reported by the patient: none  There were no vitals filed for this visit.  ASSESSMENT and PLAN  1. CKD (chronic kidney disease) stage 3, GFR 30-59 ml/min (HCC) -  CMP14+EGFR; Future  2. Spinal stenosis of lumbar region with neurogenic claudication - gabapentin (NEURONTIN) 300 MG capsule; TAKE 2 CAPSULES(600 MG) BY MOUTH THREE TIMES DAILY  3. Hyperlipidemia, unspecified hyperlipidemia type - atorvastatin (LIPITOR) 40 MG tablet; TAKE 1 TABLET(40 MG) BY MOUTH DAILY  Other orders - omeprazole (PRILOSEC) 20 MG capsule; Take 1 capsule (20 mg total) by mouth 2 (two) times daily. - metoprolol succinate (TOPROL XL) 25 MG 24 hr tablet; Take 1 tablet (25 mg total) by mouth daily. - amLODipine (NORVASC) 10 MG tablet; Take 1 tablet (10 mg total) by mouth daily.  FOLLOW-UP: as scheduled for CPE   The above assessment and management plan was discussed with the patient. The patient verbalized understanding of and has agreed to the management plan. Patient is aware to call the clinic if symptoms persist or worsen. Patient is aware when to return to the clinic for a follow-up visit. Patient educated on when it is appropriate to go to the emergency department.    I provided 12 minutes of non-face-to-face time during this encounter.  Rutherford Guys, MD Primary Care at [place], [place] [address] Ph.  [phone] Fax [phone]

## 2018-12-27 NOTE — Progress Notes (Signed)
pt is not having any medical concerns this morning, went through the functional status and 6cit questionairre with pt. the only problem had was saying the months of the yr in reverse. took 3 attemps Refill needed on the meclizine only. Per lab note, Co2 is to be rechecked for pending Nephrology referral

## 2019-01-07 ENCOUNTER — Other Ambulatory Visit: Payer: Self-pay

## 2019-01-07 ENCOUNTER — Ambulatory Visit: Payer: Medicare Other | Admitting: Registered Nurse

## 2019-01-10 ENCOUNTER — Telehealth: Payer: Self-pay | Admitting: Neurology

## 2019-01-10 NOTE — Telephone Encounter (Signed)
Patient called regarding a new headache medication. He said he is needing to contact [facility] and Medicaid as to why they won't pay for the new medication. Please Call. Thanks

## 2019-01-11 NOTE — Telephone Encounter (Signed)
Looks like he was supposed to start [place].

## 2019-01-13 NOTE — Telephone Encounter (Signed)
Please look into this. Thanks.

## 2019-01-13 NOTE — Telephone Encounter (Signed)
Cover my meds calling today regarding needing a Prior Auth for his Ubrelvy medication. The Key Reference # is [id number]. Please Call. Thanks

## 2019-01-14 ENCOUNTER — Telehealth: Payer: Self-pay | Admitting: Neurology

## 2019-01-14 NOTE — Telephone Encounter (Signed)
Calling checking in on a PA for the Ubrelvy 100mg . Call them at [phone] and Reference- pharm#: [id number] and Store#: [id number]. Thanks!

## 2019-01-17 ENCOUNTER — Encounter: Payer: Self-pay | Admitting: *Deleted

## 2019-01-17 NOTE — Progress Notes (Signed)
[name] (Key: [id number])  Initiated through covermymeds  [employer] is reviewing your PA request. Typically an electronic response will be received within 72 hours. To check for an update later, open this request from your dashboard. [name] (Key: [id number])  Rx #: [id number]  [name] 100MG  tablets  Form [employer] Medicare Part D Electronic Prior Authorization Form (2017 NCPDP)  Created  19 days ago  Sent to Plan  18 minutes ago  Plan Response  18 minutes ago  Submit Clinical Questions  5 minutes ago  Determination  Wait for Determination Please wait for [employer] Medicare 2017 NCPDP to return a determination.

## 2019-01-17 NOTE — Progress Notes (Signed)
I called [facility] after receiving the response below afterDwight Nori Riis (Key: [id number])  Rx #: [id number]  [name] 100MG  tablets  Form [employer] Medicare Part D Electronic Prior Authorization Form (2017 NCPDP)  Created  19 days ago  Sent to Plan  2 hours ago  Plan Response  2 hours ago  Submit Clinical Questions  1 hour ago  Determination  Unknown  1 hour ago  Message from Plan We have a prior authorization request pending review for the requested drug for this member. For questions regarding this member request, please contact the phone number on the back of the prescription ID card.

## 2019-01-17 NOTE — Telephone Encounter (Signed)
PA sent into cover my meds, printed and sending for scanning

## 2019-01-18 ENCOUNTER — Other Ambulatory Visit: Payer: Self-pay

## 2019-01-18 ENCOUNTER — Ambulatory Visit (INDEPENDENT_AMBULATORY_CARE_PROVIDER_SITE_OTHER): Payer: Medicare Other | Admitting: Podiatry

## 2019-01-18 ENCOUNTER — Encounter: Payer: Self-pay | Admitting: Podiatry

## 2019-01-18 VITALS — Temp 97.5°F

## 2019-01-18 DIAGNOSIS — M79676 Pain in unspecified toe(s): Secondary | ICD-10-CM | POA: Diagnosis not present

## 2019-01-18 DIAGNOSIS — B351 Tinea unguium: Secondary | ICD-10-CM

## 2019-01-18 NOTE — Progress Notes (Signed)
Patient ID: Jaime Barnes, male   DOB: [birth year], 68 y.o.   MRN: [id number] Complaint:  Visit Type: Patient returns to my office for continued preventative foot care services. Complaint: Patient states" my nails have grown long and thick and become painful to walk and wear shoes" . The patient presents for preventative foot care services. No changes to ROS  Podiatric Exam: Vascular: dorsalis pedis and posterior tibial pulses are palpable bilateral. Capillary return is immediate. Temperature gradient is WNL. Skin turgor WNL  Sensorium: Normal Semmes Weinstein monofilament test. Normal tactile sensation bilaterally. Nail Exam: Pt has thick disfigured discolored nails with subungual debris noted bilateral entire nail hallux through fifth toenails Ulcer Exam: There is no evidence of ulcer or pre-ulcerative changes or infection. Orthopedic Exam: Muscle tone and strength are WNL. No limitations in general ROM. No crepitus or effusions noted. Foot type and digits show no abnormalities. Bony prominences are unremarkable. Skin: No Porokeratosis. No infection or ulcers  Diagnosis:  Onychomycosis, , Pain in right toe, pain in left toes  Treatment & Plan Procedures and Treatment: Consent by patient was obtained for treatment procedures. The patient understood the discussion of treatment and procedures well. All questions were answered thoroughly reviewed. Debridement of mycotic and hypertrophic toenails, 1 through 5 bilateral and clearing of subungual debris. No ulceration, no infection noted.   Return Visit-Office Procedure: Patient instructed to return to the office for a follow up visit 10 weeks  for continued evaluation and treatment.    Gardiner Barefoot DPM

## 2019-01-18 NOTE — Telephone Encounter (Signed)
We rcvd a denial for the Ubrelvy, I am starting an appeal. It must be signed by Dr. Tomi Likens, he will be in the office tomorrow.

## 2019-01-18 NOTE — Progress Notes (Signed)
Jaime Barnes was denied. Jaime Barnes is working on appeal. 01/18/2019

## 2019-01-20 ENCOUNTER — Telehealth: Payer: Self-pay | Admitting: Cardiovascular Disease

## 2019-01-20 NOTE — Telephone Encounter (Signed)
Home phone/ my chart/ virtual consent/ pre reg completed °

## 2019-01-21 ENCOUNTER — Other Ambulatory Visit: Payer: Self-pay

## 2019-01-21 ENCOUNTER — Telehealth (INDEPENDENT_AMBULATORY_CARE_PROVIDER_SITE_OTHER): Payer: Medicare Other | Admitting: Cardiovascular Disease

## 2019-01-21 ENCOUNTER — Encounter: Payer: Self-pay | Admitting: Cardiovascular Disease

## 2019-01-21 VITALS — BP 129/82 | HR 65 | Ht 66.0 in | Wt 167.0 lb

## 2019-01-21 DIAGNOSIS — I2510 Atherosclerotic heart disease of native coronary artery without angina pectoris: Secondary | ICD-10-CM

## 2019-01-21 DIAGNOSIS — E785 Hyperlipidemia, unspecified: Secondary | ICD-10-CM

## 2019-01-21 DIAGNOSIS — E781 Pure hyperglyceridemia: Secondary | ICD-10-CM | POA: Diagnosis not present

## 2019-01-21 DIAGNOSIS — I739 Peripheral vascular disease, unspecified: Secondary | ICD-10-CM | POA: Diagnosis not present

## 2019-01-21 DIAGNOSIS — E7800 Pure hypercholesterolemia, unspecified: Secondary | ICD-10-CM

## 2019-01-21 DIAGNOSIS — I10 Essential (primary) hypertension: Secondary | ICD-10-CM | POA: Diagnosis not present

## 2019-01-21 HISTORY — DX: Pure hyperglyceridemia: E78.1

## 2019-01-21 NOTE — Patient Instructions (Signed)
Medication Instructions:  Your physician recommends that you continue on your current medications as directed. Please refer to the Current Medication list given to you today.  If you need a refill on your cardiac medications before your next appointment, please call your pharmacy.   Lab work: NONE  Testing/Procedures: Your physician has requested that you have a lower or upper extremity arterial duplex. This test is an ultrasound of the arteries in the legs or arms. It looks at arterial blood flow in the legs and arms. Allow one hour for Lower and Upper Arterial scans. There are no restrictions or special instructions LOWER IN 6 MONTHS FOLLOW UP WITH DR [facility] FEW DAYS AFTER  Follow-Up: At [facility], you and your health needs are our priority.  As part of our continuing mission to provide you with exceptional heart care, we have created designated Provider Care Teams.  These Care Teams include your primary Cardiologist (physician) and Advanced Practice Providers (APPs -  Physician Assistants and Nurse Practitioners) who all work together to provide you with the care you need, when you need it. You will need a follow up appointment in 6 months FEW AFTER ARTERIAL DUPLEX  Please call our office 2 months in advance to schedule this appointment.  You may see Skeet Latch, MD or one of the following Advanced Practice Providers on your designated Care Team:   Kerin Ransom, PA-C Roby Lofts, [place] . Sande Rives, PA-C

## 2019-01-21 NOTE — Progress Notes (Signed)
Virtual Visit via Video Note   This visit type was conducted due to national recommendations for restrictions regarding the COVID-19 Pandemic (e.g. social distancing) in an effort to limit this patient's exposure and mitigate transmission in our community.  Due to his co-morbid illnesses, this patient is at least at moderate risk for complications without adequate follow up.  This format is felt to be most appropriate for this patient at this time.  All issues noted in this document were discussed and addressed.  A limited physical exam was performed with this format.  Please refer to the patient's chart for his consent to telehealth for [facility].   Evaluation Performed:  Follow-up visit  Date:  01/21/2019   ID:  Jaime Barnes, DOB Aug 12, 1951, MRN [id number]  Patient Location: Home Provider Location: Office  PCP:  Rutherford Guys, MD  Cardiologist:  Skeet Latch, MD  Electrophysiologist:  None   Chief Complaint:  Follow up  History of Present Illness:    Jaime Barnes is a 68 y.o. male with hypertension, hyperlipidemia non-obstructive CAD, and moderate PAD who presents for follow up.  Jaime Barnes was evaluated by his PCP's office on 04/2015 with symptoms concerning for claudication.  He reports one month of leg cramping and pain when walking up an incline. He has no pain when walking down hill or on flat land.  He underwent ABI testing that was negative for occlusive disease.  He was referred to cardiology due to suspicion that this was a false negative.  He was referred for exercise ABIs that were normal but Doppler showed 30-49% SFA disease without focal stenosis.  The L peroneal was occluded.  Jaime Barnes had a [place] 06/2004 that revealed 40% LAD, [name]  LVEF was 50%.   HCTZ-lisinopril was switched to amlodipine 2/2 worsening renal function.  He followed up with Almyra Deforest, PA on 04/2017 and was doing well.  He again saw Isaac Laud 04/2018 for dizziness.  He was referred  for carotid Dopplers that revealed mild stenosis bilaterally.  He also had a 30-day event monitor that showed sinus rhythm with occasional PACs and PVCs.  He also had a 5 beat run of NSVT.  [name] was referred for a Lexiscan Myoview 06/2018 that revealed LVEF 47% and no ischemia.  Jaime Barnes was referred for repeat ABIs 06/2018 which were normal.  Ultrasound was not performed.    Since his last appointment Jaime Barnes has been doing well.  He continues to exercise regularly.  He walks his dog daily and does push ups/sit ups.  He has no exertional chest pain or shortness of breath.  He hasn't experienced any claudication, though he attributes this to walking on flat surface.  He denies lower extremity edema, orhthopnea or PND.  He checks his BP regularly and it has been consistently <130/80.  He is struggling with tooth pain but can't get to a dentist appointment due to COVID-19.  He notes that his diet has been good.  He is cutting back on portions and is losing weight.  He has been struggling with social distancing but is otherwise well.  The patient does not have symptoms concerning for COVID-19 infection (fever, chills, cough, or new shortness of breath).    Past Medical History:  Diagnosis Date  . Barrett's esophagus 2011   Recommend repeat EGD for surveillance in 2 yrs-[facility]  . Bipolar disorder ([place])   . Chronic kidney disease   . Cirrhosis ([place])   .  Diverticulosis of colon   . GANGLION CYST, TENDON SHEATH 06/26/2010  . Hyperlipidemia   . Hypertension   . Hypertriglyceridemia 01/21/2019  . Impotence   . Migraine   . Pancreatitis   . Pulmonary nodule   . Pulmonary nodule seen on imaging study 02/06/2011   Followup CT in December 2014 with 20 months of stability at 4 mm.  No further imaging recommended    Past Surgical History:  Procedure Laterality Date  . CARDIAC CATHETERIZATION  2005   Nonobstructive, <40%  . TONSILLECTOMY Bilateral 1961     Current Meds  Medication Sig  . albuterol  (PROVENTIL HFA;VENTOLIN HFA) 108 (90 Base) MCG/ACT inhaler INHALE 1 TO 2 PUFFS INTO THE LUNGS EVERY 6 HOURS AS NEEDED FOR WHEEZING OR SHORTNESS OF BREATH,USE 15 MINUTES PRIOR TO ACTIVITY  . amLODipine (NORVASC) 10 MG tablet Take 1 tablet (10 mg total) by mouth daily.  [name] aspirin 81 MG chewable tablet Chew 81 mg by mouth daily.  . chlorhexidine (PERIDEX) 0.12 % solution   . clindamycin (CLEOCIN) 300 MG capsule TK 1 C PO Q 6 H  . gabapentin (NEURONTIN) 300 MG capsule TAKE 2 CAPSULES(600 MG) BY MOUTH THREE TIMES DAILY  . hydrOXYzine (ATARAX/VISTARIL) 25 MG tablet Take 1 tablet (25 mg total) by mouth at bedtime as needed (headaches).  [name] MAGNESIUM-OXIDE 400 (241.3 Mg) MG tablet TAKE 1 TABLET BY MOUTH EVERY DAY  . meclizine (ANTIVERT) 12.5 MG tablet TAKE 1 TABLET BY MOUTH THREE TIMES DAILY AS NEEDED FOR DIZZINESS  . metoprolol succinate (TOPROL XL) 25 MG 24 hr tablet Take 1 tablet (25 mg total) by mouth daily.  [name] omeprazole (PRILOSEC) 20 MG capsule Take 1 capsule (20 mg total) by mouth 2 (two) times daily.  . sildenafil (VIAGRA) 100 MG tablet Take 0.5 tablets (50 mg total) by mouth daily as needed for erectile dysfunction.  [name] tiZANidine (ZANAFLEX) 4 MG tablet Take 1 tablet (4 mg total) by mouth at bedtime as needed for muscle spasms.  [name] topiramate (TOPAMAX) 100 MG tablet Take 1 tablet (100 mg total) by mouth 2 (two) times daily.  [name] Ubrogepant (UBRELVY) 100 MG TABS Take 1 tablet by mouth every 2 (two) hours as needed (once). Maximum 2 tablets in 24 hours  . verapamil (CALAN) 120 MG tablet TAKE 1 TABLET(120 MG) BY MOUTH THREE TIMES DAILY  . [DISCONTINUED] Magnesium Oxide 400 (240 Mg) MG TABS TK 1 T PO D     Allergies:   Codeine phosphate and Penicillins   Social History   Tobacco Use  . Smoking status: Light Tobacco Smoker    Packs/day: 0.25    Years: 53.00    Pack years: 13.25    Types: Cigars    Start date: 09/29/1962    Last attempt to quit: 08/04/2016    Years since quitting: 2.4  . Smokeless  tobacco: Former Systems developer    Quit date: 03/08/2017  . Tobacco comment: 2-3 x a week  Substance Use Topics  . Alcohol use: No    Comment: Last drink "30 years ago"  . 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      Family Hx: The patient's family history includes Anemia in his mother; Diabetes in his daughter; Hypertension in his daughter; Migraines in his daughter.  ROS:   Please see the history of present illness.     All other systems reviewed and are negative.   Prior CV studies:   The  following studies were reviewed today:  TTE 12/03/07: LVEF 55-65%.  Trace MR.  LHC 07/03/04: 40% mid LAD, 30% mid LCx, 40% mid RCA, 20% distal RCA.  LV gram LVEF 50%.  Exercise Myoview 12/03/07: Exercised 7 minutes on Bruce protocol.  8 METS.  Adequate HR response.  No infarct or inducible ischemia.  30 day event monitor 04/28/18: Quality: Fair.  Baseline artifact. Predominant rhythm: sinus rhythm Average heart rate: 73 bpm Max heart rate: 151 bpm Min heart rate: 51 bpm  PACs and PVCs noted 5 beats NSVT  Carotid Doppler 04/30/18: 1-39% ICA stenosis biaterally  ABI 06/2018: 1.2 bilaterally  Lexiscan Myoview 06/2018:  LVEF 47%.  No ischemia.  Labs/Other Tests and Data Reviewed:    EKG:  No ECG reviewed.  Recent Labs: 07/20/2018: Hemoglobin 13.5; Magnesium 2.0; Platelets 215 10/21/2018: ALT 15; TSH 2.240 11/10/2018: BUN 10; Creatinine, Ser 1.31; Potassium 3.8; Sodium 140   Recent Lipid Panel Lab Results  Component Value Date/Time   CHOL 123 10/21/2018 11:14 AM   TRIG 316 (H) 10/21/2018 11:14 AM   HDL 31 (L) 10/21/2018 11:14 AM   CHOLHDL 4.0 10/21/2018 11:14 AM   CHOLHDL 3.8 07/16/2015 03:22 PM   LDLCALC 29 10/21/2018 11:14 AM   LDLDIRECT 156 (H) 06/09/2011 03:55 PM    Wt Readings from Last 3 Encounters:  01/21/19 167 lb (75.8 kg)  12/14/18 168 lb 6 oz (76.4 kg)  10/21/18 162 lb (73.5 kg)     Objective:    BP 129/82   Pulse 65   Ht 5'  6" (1.676 m)   Wt 167 lb (75.8 kg)   BMI 26.95 kg/m  GENERAL: Well-appearing.  No acute distress. HEENT: Pupils equal round.  Oral mucosa unremarkable NECK:  No jugular venous distention, no visible thyromegaly EXT:  No edema, no cyanosis no clubbing SKIN:  No rashes no nodules NEURO:  Speech fluent.  Cranial nerves grossly intact.  Moves all 4 extremities freely PSYCH:  Cognitively intact, oriented to person place and time  ASSESSMENT & PLAN:    # Non-obstructive CAD:  Mild disease on cath in 2005.  He has no symptoms of angina and exercises regularly.  Continue aspirin, atorvastatin, and metoprolol.  He had a stress test 06/2018 that was negative for ischemia.  # NSVT:  # PVCs/PACs: No recent palpitations.  Continue metoprolol.  Negative stress test 06/2018.  # PAD: Jaime Barnes had moderate PAD 05/2015 by ultrasound but normal ABIs.  ABIs were also normal 06/2018.  We will repeat lower extremity arterial ultrasound 06/2019.[name]  Continue aspirin and atorvastatin.    # HTN: BP remains well-controlled.  Continue amlodipine, verapamil and metoprolol.   # Hyperlipidemia: # Hypertriglyceridemia: LDL was 29 on 09/2018.  However history glycerides were elevated to 316.  He does not drink alcohol and is working on his diet.  We discussed limiting high glycemic index foods.  He will also work on increasing his cardio.  Will repeat lipids 06/2019.  If triglycerides remain elevated we will likely start fenofibrate given that he has cardiovascular disease.  # CV Disease prevention: Continue ASA 81mg  daily and statin.  # Tobacco cessation: Patient encouraged to quit cigars as well.    COVID-19 Education: The signs and symptoms of COVID-19 were discussed with the patient and how to seek care for testing (follow up with PCP or arrange E-visit).  The importance of social distancing was discussed today.  Time:   Today, I have spent 17 minutes with the patient with telehealth  technology discussing  the above problems.     Medication Adjustments/Labs and Tests Ordered: Current medicines are reviewed at length with the patient today.  Concerns regarding medicines are outlined above.   Tests Ordered: No orders of the defined types were placed in this encounter.   Medication Changes: No orders of the defined types were placed in this encounter.   Disposition:  Follow up in 6 month(s)  Signed, Skeet Latch, MD  01/21/2019 9:57 AM    [place]

## 2019-01-25 ENCOUNTER — Ambulatory Visit: Payer: Medicare Other | Admitting: Family Medicine

## 2019-01-26 ENCOUNTER — Other Ambulatory Visit: Payer: Self-pay

## 2019-01-26 ENCOUNTER — Other Ambulatory Visit: Payer: Self-pay | Admitting: Family Medicine

## 2019-01-26 ENCOUNTER — Ambulatory Visit (INDEPENDENT_AMBULATORY_CARE_PROVIDER_SITE_OTHER): Payer: Medicare Other | Admitting: Family Medicine

## 2019-01-26 ENCOUNTER — Encounter: Payer: Self-pay | Admitting: Family Medicine

## 2019-01-26 ENCOUNTER — Encounter: Payer: Medicare Other | Admitting: Family Medicine

## 2019-01-26 DIAGNOSIS — N183 Chronic kidney disease, stage 3 unspecified: Secondary | ICD-10-CM

## 2019-01-26 DIAGNOSIS — E785 Hyperlipidemia, unspecified: Secondary | ICD-10-CM

## 2019-01-26 NOTE — Progress Notes (Signed)
Pt c/o follow up on 3 month check up.

## 2019-01-27 LAB — CMP14+EGFR
ALT: 12 IU/L (ref 0–44)
AST: 12 IU/L (ref 0–40)
Albumin/Globulin Ratio: 2.1 (ref 1.2–2.2)
Albumin: 4.6 g/dL (ref 3.8–4.8)
Alkaline Phosphatase: 92 IU/L (ref 39–117)
BUN/Creatinine Ratio: 11 (ref 10–24)
BUN: 16 mg/dL (ref 8–27)
Bilirubin Total: 0.3 mg/dL (ref 0.0–1.2)
CO2: 16 mmol/L — ABNORMAL LOW (ref 20–29)
Calcium: 9.4 mg/dL (ref 8.6–10.2)
Chloride: 108 mmol/L — ABNORMAL HIGH (ref 96–106)
Creatinine, Ser: 1.42 mg/dL — ABNORMAL HIGH (ref 0.76–1.27)
GFR calc Af Amer: 58 mL/min/{1.73_m2} — ABNORMAL LOW (ref >59)
GFR calc non Af Amer: 50 mL/min/{1.73_m2} — ABNORMAL LOW (ref >59)
Globulin, Total: 2.2 g/dL (ref 1.5–4.5)
Glucose: 90 mg/dL (ref 65–99)
Potassium: 4.2 mmol/L (ref 3.5–5.2)
Sodium: 141 mmol/L (ref 134–144)
Total Protein: 6.8 g/dL (ref 6.0–8.5)

## 2019-01-27 LAB — LIPID PANEL
Chol/HDL Ratio: 3.1 ratio (ref 0.0–5.0)
Cholesterol, Total: 114 mg/dL (ref 100–199)
HDL: 37 mg/dL — ABNORMAL LOW (ref >39)
LDL Calculated: 60 mg/dL (ref 0–99)
Triglycerides: 87 mg/dL (ref 0–149)
VLDL Cholesterol Cal: 17 mg/dL (ref 5–40)

## 2019-01-27 NOTE — Progress Notes (Signed)
I faxed an appeal on 01/18/19. I rcvd an approval letter from [facility].  Authorization number [id number] valid 01/17/2019 - 12/312020  Called [facility], advised with Jaime Barnes of approval.  Called and LMOVM advising Jaime Barnes has been approved and [facility] is aware.

## 2019-02-03 NOTE — Addendum Note (Signed)
Addended by: Rutherford Guys on: 02/03/2019 10:31 AM   Modules accepted: Orders

## 2019-02-04 NOTE — Progress Notes (Signed)
Lm for pt to call bk to give results from lab

## 2019-02-08 ENCOUNTER — Telehealth: Payer: Self-pay | Admitting: Family Medicine

## 2019-02-08 NOTE — Telephone Encounter (Signed)
Copied from [place]. Topic: Quick Communication - Lab Results (Clinic Use ONLY) >> Feb 08, 2019 12:17 PM Scherrie Gerlach wrote: Pt calling back (return call) about lab results

## 2019-02-11 ENCOUNTER — Telehealth: Payer: Self-pay | Admitting: Family Medicine

## 2019-02-11 NOTE — Telephone Encounter (Signed)
Pt. Given results and instructions. Verbalizes understanding. States he has not heard from anyone about an appointment for a kidney doctor. Please advise pt.

## 2019-02-11 NOTE — Telephone Encounter (Signed)
LVM to call back put in CRM ok to give results.

## 2019-02-14 ENCOUNTER — Encounter: Payer: Self-pay | Admitting: Registered Nurse

## 2019-02-14 ENCOUNTER — Ambulatory Visit (INDEPENDENT_AMBULATORY_CARE_PROVIDER_SITE_OTHER): Payer: Medicare Other | Admitting: Registered Nurse

## 2019-02-14 DIAGNOSIS — Z0000 Encounter for general adult medical examination without abnormal findings: Secondary | ICD-10-CM | POA: Diagnosis not present

## 2019-02-14 NOTE — Progress Notes (Signed)
Jaime Barnes is a pleasant 68 y.o. male presenting today for Medicare Annual Wellness Visit. [name] understands that this visit is taking place over telephone as a substitute for an in-person visit. He was identified using DOB and address.    Date of last exam: 12/27/18 with PCP Dr. Pamella Pert  Interpreter used for this visit? No  Patient Care Team: Rutherford Guys, MD as PCP - General (Family Medicine) Skeet Latch, MD as PCP - Cardiology (Cardiology) Lovena Neighbours, DMD (Dentistry) Skeet Latch, MD as Attending Physician (Cardiology) Wilford Corner, MD as Consulting Physician (Gastroenterology)   Other items to address today:  N/A   Other Screening: Last screening for diabetes:  Lab Results  Component Value Date   HGBA1C 5.9 12/05/2015    Last lipid screening: Lab Results  Component Value Date   CHOL 114 01/26/2019   HDL 37 (L) 01/26/2019   LDLCALC 60 01/26/2019   LDLDIRECT 156 (H) 06/09/2011   TRIG 87 01/26/2019   CHOLHDL 3.1 01/26/2019     ADVANCE DIRECTIVES: Discussed: YES On File: NO Materials Provided: No  Immunization status:  Immunization History  Administered Date(s) Administered  . Influenza Split 08/24/2012  . Influenza Whole 09/02/2007, 06/23/2008, 06/09/2011  . Influenza, High Dose Seasonal PF 06/27/2017, 06/25/2018  . Influenza,inj,Quad PF,6+ Mos 07/29/2013  . Influenza-Unspecified 08/24/2012, 08/29/2016, 06/27/2017  . Pneumococcal Conjugate-13 03/18/2016  . Pneumococcal Polysaccharide-23 03/27/2017  . Tdap 06/09/2011     There are no preventive care reminders to display for this patient.   Functional Status Survey: Is the patient deaf or have difficulty hearing?: No Does the patient have difficulty seeing, even when wearing glasses/contacts?: No Does the patient have difficulty concentrating, remembering, or making decisions?: No Does the patient have difficulty walking or climbing stairs?: No Does the patient have  difficulty dressing or bathing?: No Does the patient have difficulty doing errands alone such as visiting a doctor's office or shopping?: No   6CIT Screen 02/14/2019  What Year? 0 points  What month? 0 points  What time? 0 points  Count back from 20 0 points  Months in reverse 0 points  Repeat phrase 2 points  Total Score 2         Home Environment: No tripping hazards, no grab bars. Feels safe and comfortable at home. Reports no falls, no mobility issues.    Patient Active Problem List   Diagnosis Date Noted  . Hypertriglyceridemia 01/21/2019  . Radiculopathy of lumbar region 08/19/2017  . CKD (chronic kidney disease) stage 3, GFR 30-59 ml/min (HCC) 03/03/2017  . Exercise-induced bronchospasm 11/11/2016  . Migraine without aura and without status migrainosus, not intractable 12/20/2015  . Back pain 05/06/2015  . Constipation 05/06/2015  . Neuropathic pain 04/19/2015  . Syncope and collapse 04/19/2015  . Bilateral calf pain 04/19/2015  . Dizziness 11/02/2014  . Dystrophic nail 08/23/2013  . Tibialis posterior tendinitis 05/03/2013  . Anemia 08/24/2012  . Plaque psoriasis 08/24/2012  . Breast pain, left 11/27/2011  . Ejaculatory disorder 04/22/2011  . CAD (coronary artery disease) 03/28/2010  . HERPES SIMPLEX INFECTION 06/29/2009  . BARRETTS ESOPHAGUS 06/12/2008  . TOBACCO USER 03/11/2007  . Hyperlipidemia 11/26/2006  . BIPOLAR DISORDER 11/26/2006  . Migraine headache 11/26/2006  . HYPERTENSION, BENIGN SYSTEMIC 11/26/2006  . HEMORRHOIDS, NOS 11/26/2006  . PEPTIC ULCER DIS., UNSPEC. W/O OBSTRUCTION 11/26/2006  . DIVERTICULOSIS OF COLON 11/26/2006  . CIRRHOSIS, ALCOHOLIC 40/06/2724  . PANCREATITIS, CHRONIC 11/26/2006  . IMPOTENCE, ORGANIC 11/26/2006  . Insomnia 11/26/2006  Past Medical History:  Diagnosis Date  . Barrett's esophagus 2011   Recommend repeat EGD for surveillance in 2 yrs-[facility]  . Bipolar disorder ([place])   . Chronic kidney disease   .  Cirrhosis ([place])   . Diverticulosis of colon   . GANGLION CYST, TENDON SHEATH 06/26/2010  . Hyperlipidemia   . Hypertension   . Hypertriglyceridemia 01/21/2019  . Impotence   . Migraine   . Pancreatitis   . Pulmonary nodule   . Pulmonary nodule seen on imaging study 02/06/2011   Followup CT in December 2014 with 20 months of stability at 4 mm.  No further imaging recommended      Past Surgical History:  Procedure Laterality Date  . CARDIAC CATHETERIZATION  2005   Nonobstructive, <40%  . TONSILLECTOMY Bilateral 1961     Family History  Problem Relation Age of Onset  . Anemia Mother   . Hypertension Daughter   . Diabetes Daughter   . Migraines Daughter      Social History   Socioeconomic History  . Marital status: Legally Separated    Spouse name: Not on file  . Number of children: 8  . Years of education: 8  . Highest education level: 8th grade  Occupational History  . Not on file  Social Needs  . Financial resource strain: Not on file  . Food insecurity:    Worry: Not on file    Inability: Not on file  . Transportation needs:    Medical: Not on file    Non-medical: Not on file  Tobacco Use  . Smoking status: Light Tobacco Smoker    Packs/day: 0.25    Years: 53.00    Pack years: 13.25    Types: Cigars    Start date: 09/29/1962    Last attempt to quit: 08/04/2016    Years since quitting: 2.5  . Smokeless tobacco: Former Systems developer    Quit date: 03/08/2017  . Tobacco comment: 2-3 x a week  Substance and Sexual Activity  . Alcohol use: No    Comment: Last drink "30 years ago"  . 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   [name] Sexual activity: Yes    Birth control/protection: Condom    Comment: Multiple sex partners. 60 in last year.  Lifestyle  . Physical activity:    Days per week: Not on file    Minutes per session: Not on file  . Stress: Not on file  Relationships  . Social connections:    Talks on phone: Not on file     Gets together: Not on file    Attends religious service: Not on file    Active member of club or organization: Not on file    Attends meetings of clubs or organizations: Not on file    Relationship status: Not on file  . Intimate partner violence:    Fear of current or ex partner: Not on file    Emotionally abused: Not on file    Physically abused: Not on file    Forced sexual activity: Not on file  Other Topics Concern  . Not on file  Social History Narrative   Pt lives alone. No stairs in home. Pt has completed 8th grade.      Allergies  Allergen Reactions  . Codeine Phosphate Other (See Comments)    REACTION: breathing difficulty  . Penicillins Rash and Other (See Comments)    Break out in  sweats/ Rash amoxicillin. Denies airway involvement Has patient had a PCN reaction causing immediate rash, facial/tongue/throat swelling, SOB or lightheadedness with hypotension: Yes Has patient had a PCN reaction causing severe rash involving mucus membranes or skin necrosis: Yes Has patient had a PCN reaction that required hospitalization No Has patient had a PCN reaction occurring within the last 10 years: No If all of the above answers are "NO", then may proceed with Cephalosporin u     Prior to Admission medications   Medication Sig Start Date End Date Taking? Authorizing Provider  albuterol (PROVENTIL HFA;VENTOLIN HFA) 108 (90 Base) MCG/ACT inhaler INHALE 1 TO 2 PUFFS INTO THE LUNGS EVERY 6 HOURS AS NEEDED FOR WHEEZING OR SHORTNESS OF BREATH,USE 15 MINUTES PRIOR TO ACTIVITY 02/25/18   Sherene Sires, DO  amLODipine (NORVASC) 10 MG tablet Take 1 tablet (10 mg total) by mouth daily. 12/27/18   Rutherford Guys, MD  aspirin 81 MG chewable tablet Chew 81 mg by mouth daily.    [provider]  atorvastatin (LIPITOR) 40 MG tablet TAKE 1 TABLET(40 MG) BY MOUTH DAILY 12/27/18   Rutherford Guys, MD  chlorhexidine (PERIDEX) 0.12 % solution  07/14/18   [provider]  clindamycin  (CLEOCIN) 300 MG capsule TK 1 C PO Q 6 H 07/14/18   [provider]  gabapentin (NEURONTIN) 300 MG capsule TAKE 2 CAPSULES(600 MG) BY MOUTH THREE TIMES DAILY 12/27/18   Rutherford Guys, MD  hydrOXYzine (ATARAX/VISTARIL) 25 MG tablet Take 1 tablet (25 mg total) by mouth at bedtime as needed (headaches). 01/08/18   Rutherford Guys, MD  MAGNESIUM-OXIDE 400 (241.3 Mg) MG tablet TAKE 1 TABLET BY MOUTH EVERY DAY 12/20/18   Skeet Latch, MD  meclizine (ANTIVERT) 12.5 MG tablet TAKE 1 TABLET BY MOUTH THREE TIMES DAILY AS NEEDED FOR DIZZINESS 12/30/17   Sherene Sires, DO  metoprolol succinate (TOPROL XL) 25 MG 24 hr tablet Take 1 tablet (25 mg total) by mouth daily. 12/27/18   Rutherford Guys, MD  omeprazole (PRILOSEC) 20 MG capsule Take 1 capsule (20 mg total) by mouth 2 (two) times daily. 12/27/18   Rutherford Guys, MD  sildenafil (VIAGRA) 100 MG tablet TAKE 1/2 (ONE-HALF) TABLET BY MOUTH ONCE DAILY AS NEEDED FOR  ERECTILE  DYSFUNCTION 01/26/19   Rutherford Guys, MD  tiZANidine (ZANAFLEX) 4 MG tablet Take 1 tablet (4 mg total) by mouth at bedtime as needed for muscle spasms. 01/08/18   Rutherford Guys, MD  topiramate (TOPAMAX) 100 MG tablet Take 1 tablet (100 mg total) by mouth 2 (two) times daily. 04/12/18   Tomi Likens, Adam R, DO  Ubrogepant (UBRELVY) 100 MG TABS Take 1 tablet by mouth every 2 (two) hours as needed (once). Maximum 2 tablets in 24 hours 12/14/18   Pieter Partridge, DO  verapamil (CALAN) 120 MG tablet TAKE 1 TABLET(120 MG) BY MOUTH THREE TIMES DAILY 10/21/18   Rutherford Guys, MD     Depression screen [facility] 2/9 02/14/2019 01/26/2019 12/27/2018 10/21/2018 06/25/2018  Decreased Interest 0 0 0 0 0  Down, Depressed, Hopeless 0 0 0 0 0  PHQ - 2 Score 0 0 0 0 0  Altered sleeping - - - - -  Tired, decreased energy - - - - -  Change in appetite - - - - -  Feeling bad or failure about yourself  - - - - -  Trouble concentrating - - - - -  Moving slowly or fidgety/restless - - - - -  Suicidal  thoughts - - - - -  PHQ-9 Score - - - - -  Difficult doing work/chores - - - - -  Some recent data might be hidden     Fall Risk  02/14/2019 01/26/2019 12/27/2018 12/14/2018 10/21/2018  Falls in the past year? 0 0 0 0 0  Number falls in past yr: 0 - 0 0 -  Injury with Fall? 0 - 0 0 -  Follow up - - - Falls evaluation completed -      PHYSICAL EXAM: There were no vitals taken for this visit. These represent patient reported vital signs taken at home. As this was a visit taking place via video chat, vital signs were not taken in office.   Wt Readings from Last 3 Encounters:  01/21/19 167 lb (75.8 kg)  12/14/18 168 lb 6 oz (76.4 kg)  10/21/18 162 lb (73.5 kg)     No exam data present    Physical Exam As this visit was conducted by video chat, a physical exam did not take place.  Education/Counseling provided regarding diet and exercise, prevention of chronic diseases, smoking/tobacco cessation, if applicable, and reviewed "Covered Medicare Preventive Services."   ASSESSMENT/PLAN: Medicare Annual Wellness Visit - Subsequent  Follow up with PCP as warranted. Upcoming appt on 03/24/19 with Dr. Pamella Pert  Patient encouraged to call clinic with any questions, comments, or concerns.  Thank you for your visit with Primary Care at [facility] today.  Maximiano Coss, NP [facility] at [place], [place] [address] [phone]

## 2019-02-15 ENCOUNTER — Other Ambulatory Visit: Payer: Self-pay

## 2019-02-24 ENCOUNTER — Other Ambulatory Visit: Payer: Self-pay | Admitting: Neurology

## 2019-03-01 ENCOUNTER — Other Ambulatory Visit: Payer: Self-pay | Admitting: Family Medicine

## 2019-03-09 DIAGNOSIS — I129 Hypertensive chronic kidney disease with stage 1 through stage 4 chronic kidney disease, or unspecified chronic kidney disease: Secondary | ICD-10-CM | POA: Diagnosis not present

## 2019-03-09 DIAGNOSIS — E872 Acidosis: Secondary | ICD-10-CM | POA: Diagnosis not present

## 2019-03-09 DIAGNOSIS — K7460 Unspecified cirrhosis of liver: Secondary | ICD-10-CM | POA: Diagnosis not present

## 2019-03-09 DIAGNOSIS — N183 Chronic kidney disease, stage 3 (moderate): Secondary | ICD-10-CM | POA: Diagnosis not present

## 2019-03-24 ENCOUNTER — Other Ambulatory Visit: Payer: Self-pay

## 2019-03-24 ENCOUNTER — Encounter: Payer: Self-pay | Admitting: Family Medicine

## 2019-03-24 ENCOUNTER — Ambulatory Visit (INDEPENDENT_AMBULATORY_CARE_PROVIDER_SITE_OTHER): Payer: Medicare Other | Admitting: Family Medicine

## 2019-03-24 ENCOUNTER — Telehealth: Payer: Self-pay

## 2019-03-24 VITALS — BP 132/82 | HR 68 | Temp 97.7°F | Ht 66.0 in | Wt 168.3 lb

## 2019-03-24 DIAGNOSIS — M79662 Pain in left lower leg: Secondary | ICD-10-CM

## 2019-03-24 DIAGNOSIS — M791 Myalgia, unspecified site: Secondary | ICD-10-CM | POA: Diagnosis not present

## 2019-03-24 DIAGNOSIS — G43009 Migraine without aura, not intractable, without status migrainosus: Secondary | ICD-10-CM

## 2019-03-24 DIAGNOSIS — I10 Essential (primary) hypertension: Secondary | ICD-10-CM | POA: Diagnosis not present

## 2019-03-24 DIAGNOSIS — J45990 Exercise induced bronchospasm: Secondary | ICD-10-CM | POA: Diagnosis not present

## 2019-03-24 DIAGNOSIS — M79661 Pain in right lower leg: Secondary | ICD-10-CM | POA: Diagnosis not present

## 2019-03-24 DIAGNOSIS — E785 Hyperlipidemia, unspecified: Secondary | ICD-10-CM

## 2019-03-24 MED ORDER — AMLODIPINE BESYLATE 10 MG PO TABS: 10.0000 mg | ORAL_TABLET | Freq: Every day | ORAL | 1 refills | Status: DC

## 2019-03-24 MED ORDER — VERAPAMIL HCL 120 MG PO TABS: ORAL_TABLET | ORAL | 3 refills | Status: DC

## 2019-03-24 MED ORDER — OMEPRAZOLE 20 MG PO CPDR: 20.0000 mg | DELAYED_RELEASE_CAPSULE | Freq: Two times a day (BID) | ORAL | 0 refills | Status: DC

## 2019-03-24 MED ORDER — METOPROLOL SUCCINATE ER 25 MG PO TB24: 25.0000 mg | ORAL_TABLET | Freq: Every day | ORAL | 3 refills | Status: DC

## 2019-03-24 MED ORDER — ALBUTEROL SULFATE HFA 108 (90 BASE) MCG/ACT IN AERS: 2.0000 | INHALATION_SPRAY | Freq: Four times a day (QID) | RESPIRATORY_TRACT | 6 refills | Status: DC | PRN

## 2019-03-24 MED ORDER — SILDENAFIL CITRATE 100 MG PO TABS: ORAL_TABLET | ORAL | 2 refills | Status: DC

## 2019-03-24 NOTE — Progress Notes (Signed)
6/25/20209:33 AM  Jaime Barnes 06-05-1951, 68 y.o., male [id number]  Chief Complaint  Patient presents with  . Hypertension    did go to kidney doctor this month  . Hyperlipidemia    HPI:   Patient is a 68 y.o. male with past medical history significant for HTN, PAD, HLP, CKD3, non obstructive CAD, barretts, cirrhosis and migraines who presents today for routine followup  Last OV March 2020 - telemedicine Referred to nephrology - saw nephrology a couple of weeks ago, no concerns, follow up has not been scheduled Saw neuro in March - changed imitrex to [employer] Saw cards in April - telemedicine Evaluated for concerns of vascular claudication - eval negative, plan for repeat ABI and labs in Oct 2020  Denies any recurring back pain, has occasional low back pain.  Continues to have lower extremity pain, now sore most of the time, pain all the way up into his buttock MRI 2018 - mild progression of spondylosis L4-5, mild bulging discs, but no stenosis Gabapentin is helping some with leg pain, has been taking only 384m TID  Requesting refill for albuterol Has been using albuterol twice a day, morning and bedtime, reports that he breathes better Started by previous PCP  Lab Results  Component Value Date   CHOL 114 01/26/2019   HDL 37 (L) 01/26/2019   LDLCALC 60 01/26/2019   LDLDIRECT 156 (H) 06/09/2011   TRIG 87 01/26/2019   CHOLHDL 3.1 01/26/2019   Lab Results  Component Value Date   CREATININE 1.42 (H) 01/26/2019   BUN 16 01/26/2019   NA 141 01/26/2019   K 4.2 01/26/2019   CL 108 (H) 01/26/2019   CO2 16 (L) 01/26/2019    Depression screen PHQ 2/9 03/24/2019 02/14/2019 01/26/2019  Decreased Interest 0 0 0  Down, Depressed, Hopeless 0 0 0  PHQ - 2 Score 0 0 0  Altered sleeping - - -  Tired, decreased energy - - -  Change in appetite - - -  Feeling bad or failure about yourself  - - -  Trouble concentrating - - -  Moving slowly or fidgety/restless - - -  Suicidal  thoughts - - -  PHQ-9 Score - - -  Difficult doing work/chores - - -  Some recent data might be hidden    Fall Risk  03/24/2019 02/14/2019 01/26/2019 12/27/2018 12/14/2018  Falls in the past year? 0 0 0 0 0  Number falls in past yr: 0 0 - 0 0  Injury with Fall? 0 0 - 0 0  Follow up - - - - Falls evaluation completed     Allergies  Allergen Reactions  . Codeine Phosphate Other (See Comments)    REACTION: breathing difficulty  . Penicillins Rash and Other (See Comments)    Break out in sweats/ Rash amoxicillin. Denies airway involvement Has patient had a PCN reaction causing immediate rash, facial/tongue/throat swelling, SOB or lightheadedness with hypotension: Yes Has patient had a PCN reaction causing severe rash involving mucus membranes or skin necrosis: Yes Has patient had a PCN reaction that required hospitalization No Has patient had a PCN reaction occurring within the last 10 years: No If all of the above answers are "NO", then may proceed with Cephalosporin u    Prior to Admission medications   Medication Sig Start Date End Date Taking? Authorizing Provider  albuterol (PROVENTIL HFA;VENTOLIN HFA) 108 (90 Base) MCG/ACT inhaler INHALE 1 TO 2 PUFFS INTO THE LUNGS EVERY 6 HOURS AS NEEDED FOR WHEEZING  OR SHORTNESS OF BREATH,USE 15 MINUTES PRIOR TO ACTIVITY 02/25/18  Yes Bland, Scott, DO  amLODipine (NORVASC) 10 MG tablet Take 1 tablet (10 mg total) by mouth daily. 12/27/18  Yes Rutherford Guys, MD  aspirin 81 MG chewable tablet Chew 81 mg by mouth daily.   Yes [provider]  atorvastatin (LIPITOR) 40 MG tablet TAKE 1 TABLET(40 MG) BY MOUTH DAILY 12/27/18  Yes Rutherford Guys, MD  chlorhexidine (PERIDEX) 0.12 % solution  07/14/18  Yes [provider]  clindamycin (CLEOCIN) 300 MG capsule TK 1 C PO Q 6 H 07/14/18  Yes [provider]  gabapentin (NEURONTIN) 300 MG capsule TAKE 2 CAPSULES(600 MG) BY MOUTH THREE TIMES DAILY 12/27/18  Yes Rutherford Guys, MD   hydrOXYzine (ATARAX/VISTARIL) 25 MG tablet Take 1 tablet (25 mg total) by mouth at bedtime as needed (headaches). 01/08/18  Yes Rutherford Guys, MD  MAGNESIUM-OXIDE 400 (241.3 Mg) MG tablet TAKE 1 TABLET BY MOUTH EVERY DAY 12/20/18  Yes Skeet Latch, MD  meclizine (ANTIVERT) 12.5 MG tablet TAKE 1 TABLET BY MOUTH THREE TIMES DAILY AS NEEDED FOR DIZZINESS 12/30/17  Yes Sherene Sires, DO  metoprolol succinate (TOPROL XL) 25 MG 24 hr tablet Take 1 tablet (25 mg total) by mouth daily. 12/27/18  Yes Rutherford Guys, MD  omeprazole (PRILOSEC) 20 MG capsule Take 1 capsule (20 mg total) by mouth 2 (two) times daily. 12/27/18  Yes Rutherford Guys, MD  sildenafil (VIAGRA) 100 MG tablet TAKE 1/2 (ONE-HALF) TABLET BY MOUTH ONCE DAILY AS NEEDED FOR  ERECTILE  DYSFUNCTION 01/26/19  Yes Rutherford Guys, MD  SUMAtriptan (IMITREX) 100 MG tablet  01/01/19  Yes [provider]  tiZANidine (ZANAFLEX) 4 MG tablet Take 1 tablet (4 mg total) by mouth at bedtime as needed for muscle spasms. 01/08/18  Yes Rutherford Guys, MD  topiramate (TOPAMAX) 100 MG tablet TAKE 1 TABLET(100 MG) BY MOUTH TWICE DAILY 02/24/19  Yes Jaffe, Adam R, DO  Ubrogepant (UBRELVY) 100 MG TABS Take 1 tablet by mouth every 2 (two) hours as needed (once). Maximum 2 tablets in 24 hours 12/14/18  Yes Jaffe, Adam R, DO  verapamil (CALAN) 120 MG tablet TAKE 1 TABLET(120 MG) BY MOUTH THREE TIMES DAILY 10/21/18  Yes Rutherford Guys, MD    Past Medical History:  Diagnosis Date  . Barrett's esophagus 2011   Recommend repeat EGD for surveillance in 2 yrs-[facility]  . Bipolar disorder ([place])   . Chronic kidney disease   . Cirrhosis ([place])   . Diverticulosis of colon   . GANGLION CYST, TENDON SHEATH 06/26/2010  . Hyperlipidemia   . Hypertension   . Hypertriglyceridemia 01/21/2019  . Impotence   . Migraine   . Pancreatitis   . Pulmonary nodule   . Pulmonary nodule seen on imaging study 02/06/2011   Followup CT in December 2014 with 20 months of  stability at 4 mm.  No further imaging recommended     Past Surgical History:  Procedure Laterality Date  . CARDIAC CATHETERIZATION  2005   Nonobstructive, <40%  . TONSILLECTOMY Bilateral 1961    Social History   Tobacco Use  . Smoking status: Light Tobacco Smoker    Packs/day: 0.25    Years: 53.00    Pack years: 13.25    Types: Cigars    Start date: 09/29/1962    Last attempt to quit: 08/04/2016    Years since quitting: 2.6  . Smokeless tobacco: Former Systems developer  Quit date: 03/08/2017  . Tobacco comment: 2-3 x a week  Substance Use Topics  . Alcohol use: No    Comment: Last drink "30 years ago"    Family History  Problem Relation Age of Onset  . Anemia Mother   . Hypertension Daughter   . Diabetes Daughter   . Migraines Daughter     Review of Systems  Constitutional: Negative for chills and fever.  Respiratory: Negative for cough and shortness of breath.   Cardiovascular: Negative for chest pain, palpitations and leg swelling.  Gastrointestinal: Negative for abdominal pain, nausea and vomiting.     OBJECTIVE:  Today's Vitals   03/24/19 0917 03/24/19 1005  BP: (!) 148/79 132/82  Pulse: 68   Temp: 97.7 F (36.5 C)   TempSrc: Oral   SpO2: 97%   Weight: 168 lb 4.8 oz (76.3 kg)   Height: '5\' 6"'  (1.676 m)    Body mass index is 27.16 kg/m.  BP Readings from Last 3 Encounters:  03/24/19 132/82  01/21/19 129/82  12/14/18 120/70    Physical Exam Vitals signs and nursing note reviewed.  Constitutional:      Appearance: He is well-developed.  HENT:     Head: Normocephalic and atraumatic.  Eyes:     Conjunctiva/sclera: Conjunctivae normal.     Pupils: Pupils are equal, round, and reactive to light.  Neck:     Musculoskeletal: Neck supple.  Cardiovascular:     Rate and Rhythm: Normal rate and regular rhythm.     Heart sounds: No murmur. No friction rub. No gallop.   Pulmonary:     Effort: Pulmonary effort is normal.     Breath sounds: Normal breath  sounds. No wheezing or rales.  Skin:    General: Skin is warm and dry.  Neurological:     Mental Status: He is alert and oriented to person, place, and time.     ASSESSMENT and PLAN  1. Hyperlipidemia, unspecified hyperlipidemia type Checking labs today, medications will be adjusted as needed, specially if CK elevated. - Lipid panel - CMP14+EGFR  2. HYPERTENSION, BENIGN SYSTEMIC Controlled. Continue current regime.  - Lipid panel - CMP14+EGFR - Care order/instruction:  3. Bilateral calf pain 4. Myalgia Favoring neuropathic as improving with gabapentin, normal ABIs. Discussed titration of gabapentin upto 652m TID. CK pending.  - CK  5. Exercise-induced bronchospasm Stable. Continue with albuterol  6. Migraine without aura and without status migrainosus, not intractable Managed by neuro  Other orders - albuterol (VENTOLIN HFA) 108 (90 Base) MCG/ACT inhaler; Inhale 2 puffs into the lungs every 6 (six) hours as needed for wheezing or shortness of breath. - verapamil (CALAN) 120 MG tablet; TAKE 1 TABLET(120 MG) BY MOUTH THREE TIMES DAILY - omeprazole (PRILOSEC) 20 MG capsule; Take 1 capsule (20 mg total) by mouth 2 (two) times daily. - metoprolol succinate (TOPROL XL) 25 MG 24 hr tablet; Take 1 tablet (25 mg total) by mouth daily. - amLODipine (NORVASC) 10 MG tablet; Take 1 tablet (10 mg total) by mouth daily. - sildenafil (VIAGRA) 100 MG tablet; TAKE 1/2 (ONE-HALF) TABLET BY MOUTH ONCE DAILY AS NEEDED FOR  ERECTILE  DYSFUNCTION  Return in about 6 months (around 09/23/2019).    IRutherford Guys MD [facility] [place] [id number].  [phone] [phone]

## 2019-03-24 NOTE — Patient Instructions (Signed)
° ° ° °  If you have lab work done today you will be contacted with your lab results within the next 2 weeks.  If you have not heard from us then please contact us. The fastest way to get your results is to register for My Chart. ° ° °IF you received an x-ray today, you will receive an invoice from [facility]. Please contact  Radiology at [phone] with questions or concerns regarding your invoice.  ° °IF you received labwork today, you will receive an invoice from [facility]. Please contact [facility] at [phone] with questions or concerns regarding your invoice.  ° °Our billing staff will not be able to assist you with questions regarding bills from these companies. ° °You will be contacted with the lab results as soon as they are available. The fastest way to get your results is to activate your My Chart account. Instructions are located on the last page of this paperwork. If you have not heard from us regarding the results in 2 weeks, please contact this office. °  ° ° ° °

## 2019-03-24 NOTE — Telephone Encounter (Signed)
Consent was faxed to Santina Evans for colonoscopy results [phone]

## 2019-03-25 LAB — LIPID PANEL
Chol/HDL Ratio: 3.2 ratio (ref 0.0–5.0)
Cholesterol, Total: 109 mg/dL (ref 100–199)
HDL: 34 mg/dL — ABNORMAL LOW (ref >39)
LDL Calculated: 57 mg/dL (ref 0–99)
Triglycerides: 89 mg/dL (ref 0–149)
VLDL Cholesterol Cal: 18 mg/dL (ref 5–40)

## 2019-03-25 LAB — CMP14+EGFR
ALT: 11 IU/L (ref 0–44)
AST: 12 IU/L (ref 0–40)
Albumin/Globulin Ratio: 2 (ref 1.2–2.2)
Albumin: 4.5 g/dL (ref 3.8–4.8)
Alkaline Phosphatase: 93 IU/L (ref 39–117)
BUN/Creatinine Ratio: 11 (ref 10–24)
BUN: 15 mg/dL (ref 8–27)
Bilirubin Total: 0.2 mg/dL (ref 0.0–1.2)
CO2: 20 mmol/L (ref 20–29)
Calcium: 9.7 mg/dL (ref 8.6–10.2)
Chloride: 107 mmol/L — ABNORMAL HIGH (ref 96–106)
Creatinine, Ser: 1.32 mg/dL — ABNORMAL HIGH (ref 0.76–1.27)
GFR calc Af Amer: 64 mL/min/{1.73_m2} (ref >59)
GFR calc non Af Amer: 55 mL/min/{1.73_m2} — ABNORMAL LOW (ref >59)
Globulin, Total: 2.2 g/dL (ref 1.5–4.5)
Glucose: 76 mg/dL (ref 65–99)
Potassium: 4.2 mmol/L (ref 3.5–5.2)
Sodium: 140 mmol/L (ref 134–144)
Total Protein: 6.7 g/dL (ref 6.0–8.5)

## 2019-03-25 LAB — CK: Total CK: 249 U/L (ref 41–331)

## 2019-03-30 ENCOUNTER — Ambulatory Visit (INDEPENDENT_AMBULATORY_CARE_PROVIDER_SITE_OTHER): Payer: Medicare Other | Admitting: Podiatry

## 2019-03-30 ENCOUNTER — Other Ambulatory Visit: Payer: Self-pay

## 2019-03-30 ENCOUNTER — Encounter: Payer: Self-pay | Admitting: Podiatry

## 2019-03-30 DIAGNOSIS — M79674 Pain in right toe(s): Secondary | ICD-10-CM

## 2019-03-30 DIAGNOSIS — M79675 Pain in left toe(s): Secondary | ICD-10-CM | POA: Diagnosis not present

## 2019-03-30 DIAGNOSIS — B351 Tinea unguium: Secondary | ICD-10-CM

## 2019-03-30 NOTE — Progress Notes (Signed)
Patient ID: Jaime Barnes, male   DOB: 1951/06/06, 68 y.o.   MRN: [id number] Complaint:  Visit Type: Patient returns to my office for continued preventative foot care services. Complaint: Patient states" my nails have grown long and thick and become painful to walk and wear shoes" . The patient presents for preventative foot care services. No changes to ROS  Podiatric Exam: Vascular: dorsalis pedis and posterior tibial pulses are palpable bilateral. Capillary return is immediate. Temperature gradient is WNL. Skin turgor WNL  Sensorium: Normal Semmes Weinstein monofilament test. Normal tactile sensation bilaterally. Nail Exam: Pt has thick disfigured discolored nails with subungual debris noted bilateral entire nail hallux through fifth toenails Ulcer Exam: There is no evidence of ulcer or pre-ulcerative changes or infection. Orthopedic Exam: Muscle tone and strength are WNL. No limitations in general ROM. No crepitus or effusions noted. Foot type and digits show no abnormalities. Bony prominences are unremarkable. Skin: No Porokeratosis. No infection or ulcers  Diagnosis:  Onychomycosis, , Pain in right toe, pain in left toes  Treatment & Plan Procedures and Treatment: Consent by patient was obtained for treatment procedures. The patient understood the discussion of treatment and procedures well. All questions were answered thoroughly reviewed. Debridement of mycotic and hypertrophic toenails, 1 through 5 bilateral and clearing of subungual debris. No ulceration, no infection noted.   Return Visit-Office Procedure: Patient instructed to return to the office for a follow up visit 10 weeks  for continued evaluation and treatment.    Gardiner Barefoot DPM

## 2019-03-31 NOTE — Progress Notes (Signed)
This encounter was created in error - please disregard.

## 2019-04-06 ENCOUNTER — Other Ambulatory Visit: Payer: Self-pay | Admitting: Gastroenterology

## 2019-04-06 DIAGNOSIS — K7030 Alcoholic cirrhosis of liver without ascites: Secondary | ICD-10-CM

## 2019-04-14 ENCOUNTER — Ambulatory Visit
Admission: RE | Admit: 2019-04-14 | Discharge: 2019-04-14 | Disposition: A | Discharge status: Home / Self Care | Payer: Medicare Other | Source: Ambulatory Visit | Attending: Gastroenterology | Admitting: Gastroenterology

## 2019-04-14 DIAGNOSIS — K7030 Alcoholic cirrhosis of liver without ascites: Secondary | ICD-10-CM

## 2019-04-30 ENCOUNTER — Other Ambulatory Visit: Payer: Self-pay | Admitting: Neurology

## 2019-05-02 NOTE — Telephone Encounter (Signed)
Requested Prescriptions   Pending Prescriptions Disp Refills  . SUMAtriptan (IMITREX) 100 MG tablet [Pharmacy Med Name: SUMATRIPTAN 100MG  TABLETS] 10 tablet 2    Sig: TAKE 1 TABLET BY MOUTH, MAY REPEAT ONCE IN 2 HOURS IF HEADACHE PERSISTS OR RECURS. DO NO EXCEED 2 TABLETS IN 24 HOURS   Rx last filled:  Pt last seen: 12/14/18   PLAN: 1.  For preventative management, topiramate 100mg  twice daily.  Kidney function carefully followed by nephrology. 2.  Due to age and cardiac history, we will discontinue sumatriptan.  For abortive therapy, he will try Ubrelvy 100mg  3.  Limit use of pain relievers to no more than 2 days out of week to prevent risk of rebound or medication-overuse headache. 4.  Keep headache diary 5.  Exercise, hydration, caffeine cessation, sleep hygiene, monitor for and avoid triggers 6.  Consider:  magnesium citrate 400mg  daily, riboflavin 400mg  daily, and coenzyme Q10 100mg  three times daily 7.  Follow up in 6 months  Follow up appt scheduled:

## 2019-06-01 DIAGNOSIS — H04123 Dry eye syndrome of bilateral lacrimal glands: Secondary | ICD-10-CM | POA: Diagnosis not present

## 2019-06-01 DIAGNOSIS — H5213 Myopia, bilateral: Secondary | ICD-10-CM | POA: Diagnosis not present

## 2019-06-01 DIAGNOSIS — H25813 Combined forms of age-related cataract, bilateral: Secondary | ICD-10-CM | POA: Diagnosis not present

## 2019-06-01 DIAGNOSIS — H10413 Chronic giant papillary conjunctivitis, bilateral: Secondary | ICD-10-CM | POA: Diagnosis not present

## 2019-06-01 DIAGNOSIS — H21233 Degeneration of iris (pigmentary), bilateral: Secondary | ICD-10-CM | POA: Diagnosis not present

## 2019-06-04 ENCOUNTER — Other Ambulatory Visit: Payer: Self-pay | Admitting: Family Medicine

## 2019-06-04 NOTE — Telephone Encounter (Signed)
Requested medication (s) are due for refill today: No  Requested medication (s) are on the active medication list: No  Last refill:  Last refilled by historical provider  Future visit scheduled: No  Notes to clinic:  Unable to refill per protocol. Last filled by historical provider.    Requested Prescriptions  Pending Prescriptions Disp Refills   omeprazole (PRILOSEC) 20 MG capsule [Pharmacy Med Name: OMEPRAZOLE 20MG  CAPSULES] 180 capsule     Sig: TAKE 1 CAPSULE(20 MG) BY MOUTH TWICE DAILY     Gastroenterology: Proton Pump Inhibitors Passed - 06/04/2019 10:24 AM      Passed - Valid encounter within last 12 months    Recent Outpatient Visits          2 months ago Hyperlipidemia, unspecified hyperlipidemia type   [facility], Lilia Argue, MD   4 months ago Hyperlipidemia, unspecified hyperlipidemia type   [facility] at [facility], Lilia Argue, MD   5 months ago CKD (chronic kidney disease) stage 3, GFR 30-59 ml/min [facility])   [facility], Lilia Argue, MD   6 months ago Abnormal blood electrolyte level   [facility], Lilia Argue, MD   7 months ago HYPERTENSION, BENIGN SYSTEMIC   [facility], Lilia Argue, MD      Future Appointments            In 1 month Skeet Latch, MD [place], [facility]   In 3 months Pamella Pert, Lilia Argue, MD [facility] at [place], [facility]

## 2019-06-10 ENCOUNTER — Other Ambulatory Visit: Payer: Self-pay

## 2019-06-10 ENCOUNTER — Encounter: Payer: Self-pay | Admitting: Podiatry

## 2019-06-10 ENCOUNTER — Ambulatory Visit (INDEPENDENT_AMBULATORY_CARE_PROVIDER_SITE_OTHER): Payer: Medicare Other | Admitting: Podiatry

## 2019-06-10 DIAGNOSIS — B351 Tinea unguium: Secondary | ICD-10-CM

## 2019-06-10 DIAGNOSIS — M79674 Pain in right toe(s): Secondary | ICD-10-CM | POA: Diagnosis not present

## 2019-06-10 DIAGNOSIS — M79675 Pain in left toe(s): Secondary | ICD-10-CM

## 2019-06-10 NOTE — Progress Notes (Signed)
Patient ID: Jaime Barnes, male   DOB: 05/26/1951, 67 y.o.   MRN: [id number] Complaint:  Visit Type: Patient returns to my office for continued preventative foot care services. Complaint: Patient states" my nails have grown long and thick and become painful to walk and wear shoes" . The patient presents for preventative foot care services. No changes to ROS  Podiatric Exam: Vascular: dorsalis pedis and posterior tibial pulses are palpable bilateral. Capillary return is immediate. Temperature gradient is WNL. Skin turgor WNL  Sensorium: Normal Semmes Weinstein monofilament test. Normal tactile sensation bilaterally. Nail Exam: Pt has thick disfigured discolored nails with subungual debris noted bilateral entire nail hallux through fifth toenails Ulcer Exam: There is no evidence of ulcer or pre-ulcerative changes or infection. Orthopedic Exam: Muscle tone and strength are WNL. No limitations in general ROM. No crepitus or effusions noted. Foot type and digits show no abnormalities. Bony prominences are unremarkable. Skin: No Porokeratosis. No infection or ulcers  Diagnosis:  Onychomycosis, , Pain in right toe, pain in left toes  Treatment & Plan Procedures and Treatment: Consent by patient was obtained for treatment procedures. The patient understood the discussion of treatment and procedures well. All questions were answered thoroughly reviewed. Debridement of mycotic and hypertrophic toenails, 1 through 5 bilateral and clearing of subungual debris. No ulceration, no infection noted.   Return Visit-Office Procedure: Patient instructed to return to the office for a follow up visit 10 weeks  for continued evaluation and treatment.    Fitzroy Mikami DPM 

## 2019-06-14 NOTE — Progress Notes (Signed)
NEUROLOGY FOLLOW UP OFFICE NOTE  Jaime Barnes [id number]  HISTORY OF PRESENT ILLNESS: Jaime Barnes is a 68 year old right-handed man with hypertension, bipolar disorder, CKD, tobacco use, Barrett's esophagus and cirrhosis who follows up for migraine.  UPDATE: Intensity:  10/10 Duration:  Within 30 minutes with Ubrelvy Frequency:  3 headaches over past 6 months. Current NSAIDS: none Current analgesics: none Current triptans: none Current anti-emetic: none Current muscle relaxants: tizanidine Current Antihypertensive medications: Lisinopril 10mg , verapamil 120mg  three times daily Current Antidepressant medications: none Current Anticonvulsant medications: topiramate 100mg  twice daily, gabapentin 300mg  twice daily Current anti-CGRP:  Ubrelvy 100mg   HISTORY: Onset:  Has remote history of migraines but they returned in 2016 after experiencing increased stress related to his ex-girlfriend. Location:Right sided Quality:pounding Initial Intensity:"50"/10 Aura:no Prodrome:no Associated symptoms: Photophobia, phonophobia.No nausea or visual disturbance Initial Duration:Until takes medication Initial Frequency:every other day Triggers:  Emotional stress Relieving factors: None Activity:Cannot function  Past abortive medication:ibuprofen, naproxen, Tylenol, Excedrin, sumatriptan (effective but contraindicated due to cardiac history) Past preventative medication:none Other past therapy:none  CT of head from 07/19/15 was unremarkable. MRI of brain from 12/04/14 showed global atrophy and mild small vessel ischemic changes. 06/23/16: Sed rate 25  PAST MEDICAL HISTORY: Past Medical History:  Diagnosis Date  . Barrett's esophagus 2011   Recommend repeat EGD for surveillance in 2 yrs-[facility]  . Bipolar disorder ([place])   . Chronic kidney disease   . Cirrhosis ([place])   . Diverticulosis of colon   . GANGLION CYST, TENDON SHEATH 06/26/2010  .  Hyperlipidemia   . Hypertension   . Hypertriglyceridemia 01/21/2019  . Impotence   . Migraine   . Pancreatitis   . Pulmonary nodule   . Pulmonary nodule seen on imaging study 02/06/2011   Followup CT in December 2014 with 20 months of stability at 4 mm.  No further imaging recommended     MEDICATIONS: Current Outpatient Medications on File Prior to Visit  Medication Sig Dispense Refill  . albuterol (VENTOLIN HFA) 108 (90 Base) MCG/ACT inhaler Inhale 2 puffs into the lungs every 6 (six) hours as needed for wheezing or shortness of breath. 8.5 g 6  . amLODipine (NORVASC) 10 MG tablet Take 1 tablet (10 mg total) by mouth daily. 90 tablet 1  . amoxicillin (AMOXIL) 500 MG capsule TK 1 C PO QID    . aspirin 81 MG chewable tablet Chew 81 mg by mouth daily.    [name] atorvastatin (LIPITOR) 40 MG tablet TAKE 1 TABLET(40 MG) BY MOUTH DAILY 90 tablet 3  . chlorhexidine (PERIDEX) 0.12 % solution   2  . gabapentin (NEURONTIN) 300 MG capsule TAKE 2 CAPSULES(600 MG) BY MOUTH THREE TIMES DAILY 180 capsule 5  . HYDROcodone-acetaminophen (NORCO/VICODIN) 5-325 MG tablet TK 1 T PO QID    . hydrOXYzine (ATARAX/VISTARIL) 25 MG tablet Take 1 tablet (25 mg total) by mouth at bedtime as needed (headaches). 30 tablet 2  . ibuprofen (ADVIL) 800 MG tablet     . MAGNESIUM-OXIDE 400 (241.3 Mg) MG tablet TAKE 1 TABLET BY MOUTH EVERY DAY 30 tablet 7  . meclizine (ANTIVERT) 12.5 MG tablet TAKE 1 TABLET BY MOUTH THREE TIMES DAILY AS NEEDED FOR DIZZINESS 30 tablet 0  . metoprolol succinate (TOPROL-XL) 25 MG 24 hr tablet     . omeprazole (PRILOSEC) 20 MG capsule TAKE 1 CAPSULE(20 MG) BY MOUTH TWICE DAILY 180 capsule 0  . sildenafil (VIAGRA) 100 MG tablet TAKE 1/2 (ONE-HALF) TABLET BY MOUTH ONCE DAILY AS NEEDED FOR  ERECTILE  DYSFUNCTION 30 tablet 2  . tiZANidine (ZANAFLEX) 4 MG tablet Take 1 tablet (4 mg total) by mouth at bedtime as needed for muscle spasms. 30 tablet 0  . topiramate (TOPAMAX) 100 MG tablet TAKE 1 TABLET(100  MG) BY MOUTH TWICE DAILY 60 tablet 4  . Ubrogepant (UBRELVY) 100 MG TABS Take 1 tablet by mouth every 2 (two) hours as needed (once). Maximum 2 tablets in 24 hours 16 tablet 5  . verapamil (CALAN) 120 MG tablet      No current facility-administered medications on file prior to visit.     ALLERGIES: Allergies  Allergen Reactions  . Codeine Phosphate Other (See Comments)    REACTION: breathing difficulty  . Penicillins Rash and Other (See Comments)    Break out in sweats/ Rash amoxicillin. Denies airway involvement Has patient had a PCN reaction causing immediate rash, facial/tongue/throat swelling, SOB or lightheadedness with hypotension: Yes Has patient had a PCN reaction causing severe rash involving mucus membranes or skin necrosis: Yes Has patient had a PCN reaction that required hospitalization No Has patient had a PCN reaction occurring within the last 10 years: No If all of the above answers are "NO", then may proceed with Cephalosporin u    FAMILY HISTORY: Family History  Problem Relation Age of Onset  . Anemia Mother   . Hypertension Daughter   . Diabetes Daughter   . Migraines Daughter     SOCIAL HISTORY: Social History   Socioeconomic History  . Marital status: Legally Separated    Spouse name: Not on file  . Number of children: 8  . Years of education: 8  . Highest education level: 8th grade  Occupational History  . Not on file  Social Needs  . Financial resource strain: Not on file  . Food insecurity    Worry: Not on file    Inability: Not on file  . Transportation needs    Medical: Not on file    Non-medical: Not on file  Tobacco Use  . Smoking status: Light Tobacco Smoker    Packs/day: 0.25    Years: 53.00    Pack years: 13.25    Types: Cigars    Start date: 09/29/1962    Last attempt to quit: 08/04/2016    Years since quitting: 2.8  . Smokeless tobacco: Former Systems developer    Quit date: 03/08/2017  . Tobacco comment: 2-3 x a week  Substance and Sexual  Activity  . Alcohol use: No    Comment: Last drink "30 years ago"  . 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   [name] Sexual activity: Yes    Birth control/protection: Condom    Comment: Multiple sex partners. 60 in last year.  Lifestyle  . Physical activity    Days per week: Not on file    Minutes per session: Not on file  . Stress: Not on file  Relationships  . Social Herbalist on phone: Not on file    Gets together: Not on file    Attends religious service: Not on file    Active member of club or organization: Not on file    Attends meetings of clubs or organizations: Not on file    Relationship status: Not on file  . Intimate partner violence    Fear of current or ex partner: Not on file    Emotionally abused: Not on file    Physically abused:  Not on file    Forced sexual activity: Not on file  Other Topics Concern  . Not on file  Social History Narrative   Pt lives alone. No stairs in home. Pt has completed 8th grade.     REVIEW OF SYSTEMS: Constitutional: No fevers, chills, or sweats, no generalized fatigue, change in appetite Eyes: No visual changes, double vision, eye pain Ear, nose and throat: No hearing loss, ear pain, nasal congestion, sore throat Cardiovascular: No chest pain, palpitations Respiratory:  No shortness of breath at rest or with exertion, wheezes GastrointestinaI: No nausea, vomiting, diarrhea, abdominal pain, fecal incontinence Genitourinary:  No dysuria, urinary retention or frequency Musculoskeletal:  No neck pain, back pain Integumentary: No rash, pruritus, skin lesions Neurological: as above Psychiatric: No depression, insomnia, anxiety Endocrine: No palpitations, fatigue, diaphoresis, mood swings, change in appetite, change in weight, increased thirst Hematologic/Lymphatic:  No purpura, petechiae. Allergic/Immunologic: no itchy/runny eyes, nasal congestion, recent allergic reactions, rashes   PHYSICAL EXAM: Blood pressure (!) 144/84, pulse 68, temperature 98 F (36.7 C), height 5\' 6"  (1.676 m), weight 172 lb (78 kg), SpO2 98 %. General: No acute distress.  Patient appears well-groomed.   Head:  Normocephalic/atraumatic Eyes:  Fundi examined but not visualized Neck: supple, no paraspinal tenderness, full range of motion Heart:  Regular rate and rhythm Lungs:  Clear to auscultation bilaterally Back: No paraspinal tenderness Neurological Exam: alert and oriented to person, place, and time. Attention span and concentration intact, recent and remote memory intact, fund of knowledge intact.  Speech fluent and not dysarthric, language intact.  CN II-XII intact. Bulk and tone normal, muscle strength 5/5 throughout.  Sensation to light touch  intact.  Deep tendon reflexes 2+ throughout.  Finger to nose testing intact.  Gait normal, Romberg negative.  IMPRESSION: Migraine without aura, without status migrainosus, not intractable  PLAN: 1.  For preventative management, topiramate 100mg  twice daily 2.  For abortive therapy, Ubrelvy 100mg  3.  Limit use of pain relievers to no more than 2 days out of week to prevent risk of rebound or medication-overuse headache. 4.  Keep headache diary 5.  Exercise, hydration, caffeine cessation, sleep hygiene, monitor for and avoid triggers 6.  Consider:  magnesium citrate 400mg  daily, riboflavin 400mg  daily, and coenzyme Q10 100mg  three times daily 7. Follow up in one year.   Metta Clines, DO  CC: Grant Fontana, MD

## 2019-06-16 ENCOUNTER — Ambulatory Visit (INDEPENDENT_AMBULATORY_CARE_PROVIDER_SITE_OTHER): Payer: Medicare Other | Admitting: Neurology

## 2019-06-16 ENCOUNTER — Other Ambulatory Visit: Payer: Self-pay

## 2019-06-16 ENCOUNTER — Encounter: Payer: Self-pay | Admitting: Neurology

## 2019-06-16 VITALS — BP 144/84 | HR 68 | Temp 98.0°F | Ht 66.0 in | Wt 172.0 lb

## 2019-06-16 DIAGNOSIS — G43009 Migraine without aura, not intractable, without status migrainosus: Secondary | ICD-10-CM | POA: Diagnosis not present

## 2019-06-16 MED ORDER — UBRELVY 100 MG PO TABS: 1.0000 | ORAL_TABLET | ORAL | 11 refills | Status: DC | PRN

## 2019-06-16 MED ORDER — TOPIRAMATE 100 MG PO TABS: ORAL_TABLET | ORAL | 11 refills | Status: DC

## 2019-06-16 NOTE — Patient Instructions (Signed)
1.  Topiramate 100mg  twice daily 2.  When you get a headache, take Ubrelvy 100mg .  May repeat dose once in 2 hours if needed (maximum 2 tablets in 24 hours) 3.  Limit use of pain relievers to no more than 2 days out of week to prevent risk of rebound or medication-overuse headache. 4.  Keep headache diary 5.  Follow up in one year.

## 2019-06-20 ENCOUNTER — Encounter: Payer: Self-pay | Admitting: *Deleted

## 2019-06-20 NOTE — Progress Notes (Addendum)
Jaime Barnes (Key: [id number]) Rx #: [id number] [name] 100MG  tablets   Form [employer] Medicare Part D Electronic Prior Authorization Form (2017 NCPDP) Created 4 days ago Sent to Plan 8 hours ago Plan Response 8 hours ago Submit Clinical Questions 8 hours ago Determination Favorable 8 hours ago Message from Plan Request Reference Number: [id number]. UBRELVY TAB 100MG  is approved through 09/29/2019. For further questions, call [phone].   Fax was sent to scan into his medical record 06/20/19 SRR

## 2019-07-15 ENCOUNTER — Other Ambulatory Visit: Payer: Self-pay | Admitting: Cardiovascular Disease

## 2019-07-15 DIAGNOSIS — I739 Peripheral vascular disease, unspecified: Secondary | ICD-10-CM

## 2019-07-20 ENCOUNTER — Ambulatory Visit (HOSPITAL_COMMUNITY)
Admission: RE | Admit: 2019-07-20 | Discharge: 2019-07-20 | Disposition: A | Discharge status: Home / Self Care | Payer: Medicare Other | Source: Ambulatory Visit | Attending: Internal Medicine | Admitting: Internal Medicine

## 2019-07-20 ENCOUNTER — Other Ambulatory Visit: Payer: Self-pay

## 2019-07-20 DIAGNOSIS — I739 Peripheral vascular disease, unspecified: Secondary | ICD-10-CM | POA: Diagnosis not present

## 2019-07-28 ENCOUNTER — Encounter: Payer: Self-pay | Admitting: Cardiovascular Disease

## 2019-07-28 ENCOUNTER — Other Ambulatory Visit: Payer: Self-pay

## 2019-07-28 ENCOUNTER — Ambulatory Visit (INDEPENDENT_AMBULATORY_CARE_PROVIDER_SITE_OTHER): Payer: Medicare Other | Admitting: Cardiovascular Disease

## 2019-07-28 VITALS — BP 124/62 | HR 57 | Ht 66.0 in | Wt 172.0 lb

## 2019-07-28 DIAGNOSIS — Z720 Tobacco use: Secondary | ICD-10-CM

## 2019-07-28 DIAGNOSIS — E7800 Pure hypercholesterolemia, unspecified: Secondary | ICD-10-CM | POA: Diagnosis not present

## 2019-07-28 DIAGNOSIS — I472 Ventricular tachycardia: Secondary | ICD-10-CM | POA: Diagnosis not present

## 2019-07-28 DIAGNOSIS — I2510 Atherosclerotic heart disease of native coronary artery without angina pectoris: Secondary | ICD-10-CM | POA: Diagnosis not present

## 2019-07-28 DIAGNOSIS — I739 Peripheral vascular disease, unspecified: Secondary | ICD-10-CM

## 2019-07-28 DIAGNOSIS — I4729 Other ventricular tachycardia: Secondary | ICD-10-CM

## 2019-07-28 NOTE — Progress Notes (Signed)
Cardiology Office Note   Date:  07/28/2019   ID:  Jaime Barnes, DOB 20-May-1951, MRN [id number]  PCP:  Rutherford Guys, MD  Cardiologist:   Skeet Latch, MD  Cardiology APP: Almyra Deforest, PA  No chief complaint on file.    History of Present Illness: Jaime Barnes is a 68 y.o. male with hypertension, hyperlipidemia non-obstructive CAD, moderate PAD, and mild carotid stenosis who presents for follow up. [name] was evaluated by his PCP's office on 04/2015 with symptoms concerning for claudication. He reported one month of leg cramping and pain when walking up an incline. He had no pain when walking down hill or on flat land. He underwent ABI testing that was negative for occlusive disease. He was referred to cardiology due to suspicion that this was a false negative. He was referred for exercise ABIs that were normal but Doppler showed 30-49% SFA disease without focal stenosis. The L peroneal was occluded.  [name] had a [place] 06/2004 that revealed 40% LAD, [name] LVEF was 50%. Repeat ABIs 06/2019 were unchanged.    HCTZ-lisinopril was switched to amlodipine 2/2 worsening renal function. He followed up with Almyra Deforest, PA on 04/2017 and was doing well. He again saw Isaac Laud 04/2018 for dizziness. He was referred for carotid Dopplers that revealed mild stenosis bilaterally. He also had a 30-day event monitor that showed sinus rhythm with occasional PACs and PVCs. He also had a 5 beat run of NSVT. [name] was referred for a Lexiscan Myoview 06/2018 that revealed LVEF 47% and no ischemia.  [name] was referred for repeat ABIs 06/2018 which were normal.  Ultrasound was not performed.  Since his last appointment [name] has been doing well.  He continues to exercise regularly.  He walks his dog almost every day.  He also plays tennis several times per week.  He has to stop after 4 or 5 minutes because of pain in his legs.  He denies any chest pain or shortness of breath.  He  also has not noted any lower extremity edema, orthopnea, or PND.  His blood pressure is consistently less than 130/80.  He continues to smoke 1 cigar but it last him for 2 or 3 days.     Past Medical History:  Diagnosis Date   Barrett's esophagus 2011   Recommend repeat EGD for surveillance in 2 yrs-[facility]   Bipolar disorder ([place])    Chronic kidney disease    Cirrhosis ([place])    Diverticulosis of colon    GANGLION CYST, TENDON SHEATH 06/26/2010   Hyperlipidemia    Hypertension    Hypertriglyceridemia 01/21/2019   Impotence    Migraine    Pancreatitis    Pulmonary nodule    Pulmonary nodule seen on imaging study 02/06/2011   Followup CT in December 2014 with 20 months of stability at 4 mm.  No further imaging recommended     Past Surgical History:  Procedure Laterality Date   CARDIAC CATHETERIZATION  2005   Nonobstructive, <40%   TONSILLECTOMY Bilateral 1961     Current Outpatient Medications  Medication Sig Dispense Refill   albuterol (VENTOLIN HFA) 108 (90 Base) MCG/ACT inhaler Inhale 2 puffs into the lungs every 6 (six) hours as needed for wheezing or shortness of breath. 8.5 g 6   amLODipine (NORVASC) 10 MG tablet Take 1 tablet (10 mg total) by mouth daily. 90 tablet 1   aspirin 81 MG chewable tablet Chew 81 mg  by mouth daily.     atorvastatin (LIPITOR) 40 MG tablet TAKE 1 TABLET(40 MG) BY MOUTH DAILY 90 tablet 3   gabapentin (NEURONTIN) 300 MG capsule TAKE 2 CAPSULES(600 MG) BY MOUTH THREE TIMES DAILY 180 capsule 5   HYDROcodone-acetaminophen (NORCO/VICODIN) 5-325 MG tablet TK 1 T PO QID     hydrOXYzine (ATARAX/VISTARIL) 25 MG tablet Take 1 tablet (25 mg total) by mouth at bedtime as needed (headaches). 30 tablet 2   ibuprofen (ADVIL) 800 MG tablet      MAGNESIUM-OXIDE 400 (241.3 Mg) MG tablet TAKE 1 TABLET BY MOUTH EVERY DAY 30 tablet 7   meclizine (ANTIVERT) 12.5 MG tablet TAKE 1 TABLET BY MOUTH THREE TIMES DAILY AS NEEDED FOR DIZZINESS 30  tablet 0   metoprolol succinate (TOPROL-XL) 25 MG 24 hr tablet      omeprazole (PRILOSEC) 20 MG capsule TAKE 1 CAPSULE(20 MG) BY MOUTH TWICE DAILY 180 capsule 0   sildenafil (VIAGRA) 100 MG tablet TAKE 1/2 (ONE-HALF) TABLET BY MOUTH ONCE DAILY AS NEEDED FOR  ERECTILE  DYSFUNCTION 30 tablet 2   tiZANidine (ZANAFLEX) 4 MG tablet Take 1 tablet (4 mg total) by mouth at bedtime as needed for muscle spasms. 30 tablet 0   topiramate (TOPAMAX) 100 MG tablet TAKE 1 TABLET(100 MG) BY MOUTH TWICE DAILY 60 tablet 11   Ubrogepant (UBRELVY) 100 MG TABS Take 1 tablet by mouth as needed (May repeat tablet in 2 hours if needed.  Maximum 2 tablets in 24 hours). Maximum 2 tablets in 24 hours 16 tablet 11   verapamil (CALAN) 120 MG tablet every morning.      No current facility-administered medications for this visit.     Allergies:   Codeine phosphate and Penicillins    Social History:  The patient  reports that he has been smoking cigars. He started smoking about 56 years ago. He has a 13.25 pack-year smoking history. He quit smokeless tobacco use about 2 years ago. He reports current drug use. Drug: Marijuana. He reports that he does not drink alcohol.   Family History:  The patient's family history includes Anemia in his mother; Diabetes in his daughter; Hypertension in his daughter; Migraines in his daughter.    ROS:  Please see the history of present illness.   Otherwise, review of systems are positive for URI.   All other systems are reviewed and negative.    PHYSICAL EXAM: VS:  BP 124/62    Pulse (!) 57    Ht 5\' 6"  (1.676 m)    Wt 172 lb (78 kg)    SpO2 99%    BMI 27.76 kg/m  , BMI Body mass index is 27.76 kg/m. GENERAL:  Well appearing HEENT: Pupils equal round and reactive, fundi not visualized, oral mucosa unremarkable NECK:  No jugular venous distention, waveform within normal limits, carotid upstroke brisk and symmetric, no bruits LUNGS:  Clear to auscultation bilaterally HEART:  RRR.   PMI not displaced or sustained,S1 and S2 within normal limits, no S3, no S4, no clicks, no rubs, no murmurs ABD:  Flat, positive bowel sounds normal in frequency in pitch, no bruits, no rebound, no guarding, no midline pulsatile mass, no hepatomegaly, no splenomegaly EXT:  2 plus TP, 1+ DP bilaterally.  no edema, no cyanosis no clubbing SKIN:  No rashes no nodules NEURO:  Cranial nerves II through XII grossly intact, motor grossly intact throughout PSYCH:  Cognitively intact, oriented to person place and time   EKG:  EKG is ordered  today. The ekg ordered 04/30/15 demonstrates sinus bradycardia at 59 bpm. 03/30/12: Sinus rhythm. Rate 73 bpm. PVCs.  07/28/19: Sinus bradycardia.  Rate 57 bpm.  Non-specific T wave abnormalities.   TTE 12/03/07: LVEF 55-65%. Trace MR.  LHC 07/03/04:40% mid LAD, 30% mid LCx, 40% mid RCA, 20% distal RCA. LV gram LVEF 50%.  Exercise Myoview 12/03/07:Exercised 7 minutes on Bruce protocol. 8 METS. Adequate HR response. No infarct or inducible ischemia.  30 day event monitor 04/28/18: Quality: Fair. Baseline artifact. Predominant rhythm: sinus rhythm Average heart rate: 73 bpm Max heart rate: 151 bpm Min heart rate: 51 bpm  PACs and PVCs noted 5 beats NSVT  Carotid Doppler 04/30/18: 1-39% ICA stenosis biaterally  ABI 06/2018: 1.2 bilaterally  Lexiscan Myoview 06/2018:  LVEF 47%.  No ischemia.   Recent Labs: 10/21/2018: TSH 2.240 03/24/2019: ALT 11; BUN 15; Creatinine, Ser 1.32; Potassium 4.2; Sodium 140    Lipid Panel    Component Value Date/Time   CHOL 109 03/24/2019 1001   TRIG 89 03/24/2019 1001   HDL 34 (L) 03/24/2019 1001   CHOLHDL 3.2 03/24/2019 1001   CHOLHDL 3.8 07/16/2015 1522   VLDL 37 (H) 07/16/2015 1522   LDLCALC 57 03/24/2019 1001   LDLDIRECT 156 (H) 06/09/2011 1555      Wt Readings from Last 3 Encounters:  07/28/19 172 lb (78 kg)  06/16/19 172 lb (78 kg)  03/24/19 168 lb 4.8 oz (76.3 kg)      Other studies  Reviewed: Additional studies/ records that were reviewed today include: . Review of the above records demonstrates:  Please see elsewhere in the note.    ASSESSMENT AND PLAN:  # Non-obstructive CAD: Mild disease on cath in 2005.  He has no symptoms of angina and exercises regularly.  Continue aspirin, atorvastatin, and metoprolol.  He had a stress test 06/2018 that was negative for ischemia.  LDL is well-controlled.    # NSVT:  # PVCs/PACs: No recent palpitations.  Continue metoprolol.  Negative stress test 06/2018.  # PAD: Mr. Tiano had moderate PAD 05/2015 by ultrasound but normal ABIs.  ABIs were also normal 06/2019 and he has good pulses.  Continue aspirin and atorvastatin.His leg pain may be more related to sciatica.  # HTN: BP remains well-controlled. It was initially elevated and improved on repeat.Continue amlodipine, verapamil and metoprolol.  # Hyperlipidemia: # Hypertriglyceridemia: LDL was 57 on 02/2019.  Continue atorvastatin.  # CV Disease prevention: Continue ASA 81mg  dailyand statin.  # Tobacco cessation:Patient encouraged to quit cigars as well.    Current medicines are reviewed at length with the patient today.  The patient does not have concerns regarding medicines.  The following changes have been made:  no change  Labs/ tests ordered today include:   No orders of the defined types were placed in this encounter.    Disposition:   FU with Dr. Jonelle Sidle C. Oval Linsey in 1 year.  Signed, Skeet Latch, MD  07/28/2019 10:02 AM    [place]

## 2019-07-28 NOTE — Patient Instructions (Addendum)
Medication Instructions:  Your physician recommends that you continue on your current medications as directed. Please refer to the Current Medication list given to you today.  *If you need a refill on your cardiac medications before your next appointment, please call your pharmacy*  Lab Work: NONE   Testing/Procedures: NONE  Follow-Up: At [facility], you and your health needs are our priority.  As part of our continuing mission to provide you with exceptional heart care, we have created designated Provider Care Teams.  These Care Teams include your primary Cardiologist (physician) and Advanced Practice Providers (APPs -  Physician Assistants and Nurse Practitioners) who all work together to provide you with the care you need, when you need it.  Your next appointment:   12 months You will receive a reminder letter in the mail two months in advance. If you don't receive a letter, please call our office to schedule the follow-up appointment.  The format for your next appointment:   Either In Person or Virtual  Provider:   You may see Skeet Latch, MD or one of the following Advanced Practice Providers on your designated Care Team:    Kerin Ransom, PA-C  [place], [place]  Coletta Memos, [place]

## 2019-08-01 ENCOUNTER — Other Ambulatory Visit: Payer: Self-pay | Admitting: Family Medicine

## 2019-08-01 NOTE — Telephone Encounter (Signed)
Requested medication (s) are due for refill today: no  Requested medication (s) are on the active medication list: yes  Last refill: 07/19/2019  Future visit scheduled: yes  Notes to clinic: one inhaler should last 1 month  Review for refill   Requested Prescriptions  Pending Prescriptions Disp Refills   albuterol (VENTOLIN HFA) 108 (90 Base) MCG/ACT inhaler [Pharmacy Med Name: ALBUTEROL HFA INH (200 PUFFS)8.5GM] 8.5 g 6    Sig: INHALE 2 PUFFS INTO THE LUNGS EVERY 6 HOURS AS NEEDED FOR WHEEZING OR SHORTNESS OF BREATH     Pulmonology:  Beta Agonists Failed - 08/01/2019  2:23 PM      Failed - One inhaler should last at least one month. If the patient is requesting refills earlier, contact the patient to check for uncontrolled symptoms.      Passed - Valid encounter within last 12 months    Recent Outpatient Visits          4 months ago Hyperlipidemia, unspecified hyperlipidemia type   Primary Care at [facility], Lilia Argue, MD   6 months ago Hyperlipidemia, unspecified hyperlipidemia type   Primary Care at [facility], Lilia Argue, MD   7 months ago CKD (chronic kidney disease) stage 3, GFR 30-59 ml/min [facility])   [facility] at [facility], Lilia Argue, MD   8 months ago Abnormal blood electrolyte level   [facility] at [facility], Lilia Argue, MD   9 months ago HYPERTENSION, BENIGN SYSTEMIC   Primary Care at [facility], Lilia Argue, MD      Future Appointments            In 1 month Rutherford Guys, MD Primary Care at [place], [facility]

## 2019-08-02 ENCOUNTER — Other Ambulatory Visit (INDEPENDENT_AMBULATORY_CARE_PROVIDER_SITE_OTHER): Payer: Medicare Other

## 2019-08-02 DIAGNOSIS — E781 Pure hyperglyceridemia: Secondary | ICD-10-CM | POA: Diagnosis not present

## 2019-08-02 DIAGNOSIS — E7800 Pure hypercholesterolemia, unspecified: Secondary | ICD-10-CM

## 2019-08-02 DIAGNOSIS — I739 Peripheral vascular disease, unspecified: Secondary | ICD-10-CM

## 2019-08-02 DIAGNOSIS — I2510 Atherosclerotic heart disease of native coronary artery without angina pectoris: Secondary | ICD-10-CM | POA: Diagnosis not present

## 2019-08-02 DIAGNOSIS — I10 Essential (primary) hypertension: Secondary | ICD-10-CM | POA: Diagnosis not present

## 2019-08-02 NOTE — Progress Notes (Signed)
ekg 

## 2019-08-05 ENCOUNTER — Encounter: Payer: Self-pay | Admitting: Cardiovascular Disease

## 2019-08-07 ENCOUNTER — Other Ambulatory Visit: Payer: Self-pay | Admitting: Family Medicine

## 2019-08-08 NOTE — Telephone Encounter (Signed)
Requested medication (s) are due for refill today: yes  Requested medication (s) are on the active medication list: yes  Last refill:  08/07/2019  Future visit scheduled: yes  Notes to clinic:  Patient requesting 90 day supply   Requested Prescriptions  Pending Prescriptions Disp Refills   albuterol (VENTOLIN HFA) 108 (90 Base) MCG/ACT inhaler [Pharmacy Med Name: ALBUTEROL HFA INH (200 PUFFS)8.5GM] 25.5 g     Sig: INHALE 2 PUFFS INTO THE LUNGS EVERY 6 HOURS AS NEEDED FOR WHEEZING OR SHORTNESS OF BREATH     Pulmonology:  Beta Agonists Failed - 08/07/2019 10:06 AM      Failed - One inhaler should last at least one month. If the patient is requesting refills earlier, contact the patient to check for uncontrolled symptoms.      Passed - Valid encounter within last 12 months    Recent Outpatient Visits          4 months ago Hyperlipidemia, unspecified hyperlipidemia type   Primary Care at [facility], Lilia Argue, MD   6 months ago Hyperlipidemia, unspecified hyperlipidemia type   Primary Care at [facility], Lilia Argue, MD   7 months ago CKD (chronic kidney disease) stage 3, GFR 30-59 ml/min [facility])   [facility] at [facility], Lilia Argue, MD   9 months ago Abnormal blood electrolyte level   [facility] at [facility], Lilia Argue, MD   9 months ago HYPERTENSION, BENIGN SYSTEMIC   [facility] at [facility], Lilia Argue, MD      Future Appointments            In 1 month Rutherford Guys, MD Primary Care at [place], [facility]

## 2019-08-24 ENCOUNTER — Other Ambulatory Visit: Payer: Self-pay

## 2019-08-24 ENCOUNTER — Ambulatory Visit (INDEPENDENT_AMBULATORY_CARE_PROVIDER_SITE_OTHER): Payer: Medicare Other | Admitting: Podiatry

## 2019-08-24 ENCOUNTER — Encounter: Payer: Self-pay | Admitting: Podiatry

## 2019-08-24 DIAGNOSIS — M79675 Pain in left toe(s): Secondary | ICD-10-CM

## 2019-08-24 DIAGNOSIS — M79674 Pain in right toe(s): Secondary | ICD-10-CM

## 2019-08-24 DIAGNOSIS — B351 Tinea unguium: Secondary | ICD-10-CM

## 2019-08-24 NOTE — Progress Notes (Signed)
Patient ID: Jaime Barnes, male   DOB: Jul 12, 1951, 68 y.o.   MRN: [id number] Complaint:  Visit Type: Patient returns to my office for continued preventative foot care services. Complaint: Patient states" my nails have grown long and thick and become painful to walk and wear shoes" . The patient presents for preventative foot care services. No changes to ROS  Podiatric Exam: Vascular: dorsalis pedis and posterior tibial pulses are palpable bilateral. Capillary return is immediate. Temperature gradient is WNL. Skin turgor WNL  Sensorium: Normal Semmes Weinstein monofilament test. Normal tactile sensation bilaterally. Nail Exam: Pt has thick disfigured discolored nails with subungual debris noted bilateral entire nail hallux through fifth toenails Ulcer Exam: There is no evidence of ulcer or pre-ulcerative changes or infection. Orthopedic Exam: Muscle tone and strength are WNL. No limitations in general ROM. No crepitus or effusions noted. Foot type and digits show no abnormalities. Bony prominences are unremarkable. Skin: No Porokeratosis. No infection or ulcers  Diagnosis:  Onychomycosis, , Pain in right toe, pain in left toes  Treatment & Plan Procedures and Treatment: Consent by patient was obtained for treatment procedures. The patient understood the discussion of treatment and procedures well. All questions were answered thoroughly reviewed. Debridement of mycotic and hypertrophic toenails, 1 through 5 bilateral and clearing of subungual debris. No ulceration, no infection noted.   Return Visit-Office Procedure: Patient instructed to return to the office for a follow up visit 10 weeks  for continued evaluation and treatment.    Gardiner Barefoot DPM

## 2019-08-26 ENCOUNTER — Other Ambulatory Visit: Payer: Self-pay | Admitting: Family Medicine

## 2019-08-29 NOTE — Progress Notes (Signed)
(  KeySamara Deist)  Rx #[id number]  Ubrelvy 100MG  tablets  prior authorization submitted via covermymeds.  Waiting response.

## 2019-08-29 NOTE — Progress Notes (Signed)
RECEIVED hard fax from [place] : CANCELLED   This product was previously approved on [id number] form 06/20/19 to 09/29/2019  [id number] Form sent to be scan into chart  Pt ID# [id number]

## 2019-09-20 ENCOUNTER — Encounter: Payer: Self-pay | Admitting: Family Medicine

## 2019-09-20 ENCOUNTER — Other Ambulatory Visit: Payer: Self-pay

## 2019-09-20 ENCOUNTER — Ambulatory Visit (INDEPENDENT_AMBULATORY_CARE_PROVIDER_SITE_OTHER): Payer: Medicare Other | Admitting: Family Medicine

## 2019-09-20 VITALS — BP 134/72 | HR 60 | Temp 98.8°F | Ht 66.0 in | Wt 170.6 lb

## 2019-09-20 DIAGNOSIS — N529 Male erectile dysfunction, unspecified: Secondary | ICD-10-CM

## 2019-09-20 DIAGNOSIS — E785 Hyperlipidemia, unspecified: Secondary | ICD-10-CM

## 2019-09-20 DIAGNOSIS — I10 Essential (primary) hypertension: Secondary | ICD-10-CM | POA: Diagnosis not present

## 2019-09-20 DIAGNOSIS — K219 Gastro-esophageal reflux disease without esophagitis: Secondary | ICD-10-CM

## 2019-09-20 DIAGNOSIS — M48062 Spinal stenosis, lumbar region with neurogenic claudication: Secondary | ICD-10-CM | POA: Diagnosis not present

## 2019-09-20 DIAGNOSIS — F5104 Psychophysiologic insomnia: Secondary | ICD-10-CM

## 2019-09-20 DIAGNOSIS — J45990 Exercise induced bronchospasm: Secondary | ICD-10-CM

## 2019-09-20 LAB — CMP14+EGFR
ALT: 13 IU/L (ref 0–44)
AST: 12 IU/L (ref 0–40)
Albumin/Globulin Ratio: 2 (ref 1.2–2.2)
Albumin: 4.9 g/dL — ABNORMAL HIGH (ref 3.8–4.8)
Alkaline Phosphatase: 99 IU/L (ref 39–117)
BUN/Creatinine Ratio: 9 — ABNORMAL LOW (ref 10–24)
BUN: 12 mg/dL (ref 8–27)
Bilirubin Total: 0.3 mg/dL (ref 0.0–1.2)
CO2: 17 mmol/L — ABNORMAL LOW (ref 20–29)
Calcium: 9.9 mg/dL (ref 8.6–10.2)
Chloride: 108 mmol/L — ABNORMAL HIGH (ref 96–106)
Creatinine, Ser: 1.29 mg/dL — ABNORMAL HIGH (ref 0.76–1.27)
GFR calc Af Amer: 65 mL/min/{1.73_m2} (ref >59)
GFR calc non Af Amer: 56 mL/min/{1.73_m2} — ABNORMAL LOW (ref >59)
Globulin, Total: 2.4 g/dL (ref 1.5–4.5)
Glucose: 95 mg/dL (ref 65–99)
Potassium: 4.3 mmol/L (ref 3.5–5.2)
Sodium: 141 mmol/L (ref 134–144)
Total Protein: 7.3 g/dL (ref 6.0–8.5)

## 2019-09-20 LAB — LIPID PANEL
Chol/HDL Ratio: 4.3 ratio (ref 0.0–5.0)
Cholesterol, Total: 155 mg/dL (ref 100–199)
HDL: 36 mg/dL — ABNORMAL LOW (ref >39)
LDL Chol Calc (NIH): 89 mg/dL (ref 0–99)
Triglycerides: 176 mg/dL — ABNORMAL HIGH (ref 0–149)
VLDL Cholesterol Cal: 30 mg/dL (ref 5–40)

## 2019-09-20 MED ORDER — METOPROLOL SUCCINATE ER 25 MG PO TB24: 25.0000 mg | ORAL_TABLET | Freq: Every day | ORAL | 1 refills | Status: DC

## 2019-09-20 MED ORDER — VERAPAMIL HCL 120 MG PO TABS: 120.0000 mg | ORAL_TABLET | Freq: Every morning | ORAL | 1 refills | Status: DC

## 2019-09-20 MED ORDER — AMLODIPINE BESYLATE 10 MG PO TABS: 10.0000 mg | ORAL_TABLET | Freq: Every day | ORAL | 1 refills | Status: DC

## 2019-09-20 MED ORDER — ALBUTEROL SULFATE HFA 108 (90 BASE) MCG/ACT IN AERS: INHALATION_SPRAY | RESPIRATORY_TRACT | 3 refills | Status: DC

## 2019-09-20 MED ORDER — GABAPENTIN 300 MG PO CAPS: ORAL_CAPSULE | ORAL | 5 refills | Status: DC

## 2019-09-20 MED ORDER — SILDENAFIL CITRATE 100 MG PO TABS: ORAL_TABLET | ORAL | 2 refills | Status: DC

## 2019-09-20 MED ORDER — ATORVASTATIN CALCIUM 40 MG PO TABS: ORAL_TABLET | ORAL | 3 refills | Status: DC

## 2019-09-20 MED ORDER — TIZANIDINE HCL 4 MG PO TABS: 4.0000 mg | ORAL_TABLET | Freq: Every evening | ORAL | 2 refills | Status: DC | PRN

## 2019-09-20 MED ORDER — OMEPRAZOLE 20 MG PO CPDR: DELAYED_RELEASE_CAPSULE | ORAL | 0 refills | Status: DC

## 2019-09-20 MED ORDER — TRAZODONE HCL 50 MG PO TABS: 25.0000 mg | ORAL_TABLET | Freq: Every evening | ORAL | 3 refills | Status: DC | PRN

## 2019-09-20 NOTE — Patient Instructions (Signed)
° ° ° °  If you have lab work done today you will be contacted with your lab results within the next 2 weeks.  If you have not heard from us then please contact us. The fastest way to get your results is to register for My Chart. ° ° °IF you received an x-ray today, you will receive an invoice from [facility]. Please contact [facility] at [phone] with questions or concerns regarding your invoice.  ° °IF you received labwork today, you will receive an invoice from [facility]. Please contact [facility] at [phone] with questions or concerns regarding your invoice.  ° °Our billing staff will not be able to assist you with questions regarding bills from these companies. ° °You will be contacted with the lab results as soon as they are available. The fastest way to get your results is to activate your My Chart account. Instructions are located on the last page of this paperwork. If you have not heard from us regarding the results in 2 weeks, please contact this office. °  ° ° ° °

## 2019-09-20 NOTE — Progress Notes (Signed)
12/22/202010:46 AM  Jaime Barnes [birth year], 68 y.o., male [id number]  Chief Complaint  Patient presents with  . Hyperlipidemia  . Insomnia    for one month. denies stress or anxiety. taking sleeping pills with no resolve, does not remember the name    HPI:   Patient is a 68 y.o. male with past medical history significant for HTN, PAD, HLP, CKD3, non obstructive CAD, barretts, cirrhosis and migraines  who presents today for routine followup  Last OV June 2020 - gabapentin for sciatica Saw cards oct 2020 - no concerns for claudication, favor scatica Saw neuro sept 2020 - start ubrelvy for abortive  Patient has been doing ok Sciatica controlled with gabapentin and zanaflex Cont to walk several times a day He will also use albuterol before walks, does well breathing wise GERD well controlled with PPI Recently moved in with his girlfriend, happy, needs refill of viagra He has recently been thinking about his mother a lot, she died 2 years ago in 03/07/2023, he has not been sleeping well Just overall stressed with current world affairs    Lab Results  Component Value Date   CHOL 109 03/24/2019   HDL 34 (L) 03/24/2019   LDLCALC 57 03/24/2019   LDLDIRECT 156 (H) 06/09/2011   TRIG 89 03/24/2019   CHOLHDL 3.2 03/24/2019   Lab Results  Component Value Date   CREATININE 1.32 (H) 03/24/2019   BUN 15 03/24/2019   NA 140 03/24/2019   K 4.2 03/24/2019   CL 107 (H) 03/24/2019   CO2 20 03/24/2019    Depression screen PHQ 2/9 09/20/2019 03/24/2019 02/14/2019  Decreased Interest 0 0 0  Down, Depressed, Hopeless 0 0 0  PHQ - 2 Score 0 0 0  Altered sleeping - - -  Tired, decreased energy - - -  Change in appetite - - -  Feeling bad or failure about yourself  - - -  Trouble concentrating - - -  Moving slowly or fidgety/restless - - -  Suicidal thoughts - - -  PHQ-9 Score - - -  Difficult doing work/chores - - -  Some recent data might be hidden    Fall Risk  09/20/2019 06/16/2019  03/24/2019 02/14/2019 01/26/2019  Falls in the past year? 0 0 0 0 0  Number falls in past yr: 0 - 0 0 -  Injury with Fall? 0 - 0 0 -  Follow up - - - - -     Allergies  Allergen Reactions  . Codeine Phosphate Other (See Comments)    REACTION: breathing difficulty  . Penicillins Rash and Other (See Comments)    Break out in sweats/ Rash amoxicillin. Denies airway involvement Has patient had a PCN reaction causing immediate rash, facial/tongue/throat swelling, SOB or lightheadedness with hypotension: Yes Has patient had a PCN reaction causing severe rash involving mucus membranes or skin necrosis: Yes Has patient had a PCN reaction that required hospitalization No Has patient had a PCN reaction occurring within the last 10 years: No If all of the above answers are "NO", then may proceed with Cephalosporin u    Prior to Admission medications   Medication Sig Start Date End Date Taking? Authorizing Provider  albuterol (VENTOLIN HFA) 108 (90 Base) MCG/ACT inhaler INHALE 2 PUFFS INTO THE LUNGS EVERY 6 HOURS AS NEEDED FOR WHEEZING OR SHORTNESS OF BREATH 08/03/19  Yes Rutherford Guys, MD  amLODipine (NORVASC) 10 MG tablet Take 1 tablet (10 mg total) by mouth daily. 03/24/19  Yes  Rutherford Guys, MD  aspirin 81 MG chewable tablet Chew 81 mg by mouth daily.   Yes [provider]  atorvastatin (LIPITOR) 40 MG tablet TAKE 1 TABLET(40 MG) BY MOUTH DAILY 12/27/18  Yes Rutherford Guys, MD  gabapentin (NEURONTIN) 300 MG capsule TAKE 2 CAPSULES(600 MG) BY MOUTH THREE TIMES DAILY 12/27/18  Yes Rutherford Guys, MD  HYDROcodone-acetaminophen (NORCO/VICODIN) 5-325 MG tablet TK 1 T PO QID 04/25/19  Yes [provider]  hydrOXYzine (ATARAX/VISTARIL) 25 MG tablet Take 1 tablet (25 mg total) by mouth at bedtime as needed (headaches). 01/08/18  Yes Rutherford Guys, MD  ibuprofen (ADVIL) 800 MG tablet  04/13/19  Yes [provider]  MAGNESIUM-OXIDE 400 (241.3 Mg) MG tablet TAKE 1 TABLET BY  MOUTH EVERY DAY 12/20/18  Yes Skeet Latch, MD  meclizine (ANTIVERT) 12.5 MG tablet TAKE 1 TABLET BY MOUTH THREE TIMES DAILY AS NEEDED FOR DIZZINESS 12/30/17  Yes Sherene Sires, DO  metoprolol succinate (TOPROL-XL) 25 MG 24 hr tablet  03/24/19  Yes [provider]  omeprazole (PRILOSEC) 20 MG capsule TAKE 1 CAPSULE(20 MG) BY MOUTH TWICE DAILY 08/26/19  Yes Rutherford Guys, MD  sildenafil (VIAGRA) 100 MG tablet TAKE 1/2 (ONE-HALF) TABLET BY MOUTH ONCE DAILY AS NEEDED FOR  ERECTILE  DYSFUNCTION 03/24/19  Yes Rutherford Guys, MD  tiZANidine (ZANAFLEX) 4 MG tablet Take 1 tablet (4 mg total) by mouth at bedtime as needed for muscle spasms. 01/08/18  Yes Rutherford Guys, MD  topiramate (TOPAMAX) 100 MG tablet TAKE 1 TABLET(100 MG) BY MOUTH TWICE DAILY 06/16/19  Yes Jaffe, Adam R, DO  Ubrogepant (UBRELVY) 100 MG TABS Take 1 tablet by mouth as needed (May repeat tablet in 2 hours if needed.  Maximum 2 tablets in 24 hours). Maximum 2 tablets in 24 hours 06/16/19  Yes Tomi Likens, Adam R, DO  verapamil (CALAN) 120 MG tablet every morning.  03/25/19  Yes [provider]    Past Medical History:  Diagnosis Date  . Barrett's esophagus 2011   Recommend repeat EGD for surveillance in 2 yrs-[facility]  . Bipolar disorder ([place])   . Chronic kidney disease   . Cirrhosis ([place])   . Diverticulosis of colon   . GANGLION CYST, TENDON SHEATH 06/26/2010  . Hyperlipidemia   . Hypertension   . Hypertriglyceridemia 01/21/2019  . Impotence   . Migraine   . Pancreatitis   . Pulmonary nodule   . Pulmonary nodule seen on imaging study 02/06/2011   Followup CT in December 2014 with 20 months of stability at 4 mm.  No further imaging recommended     Past Surgical History:  Procedure Laterality Date  . CARDIAC CATHETERIZATION  2005   Nonobstructive, <40%  . TONSILLECTOMY Bilateral 1961    Social History   Tobacco Use  . Smoking status: Light Tobacco Smoker    Packs/day: 0.25    Years: 53.00    Pack  years: 13.25    Types: Cigars    Start date: 09/29/1962    Last attempt to quit: 08/04/2016    Years since quitting: 3.1  . Smokeless tobacco: Former Systems developer    Quit date: 03/08/2017  . Tobacco comment: cigar occassionally   Substance Use Topics  . Alcohol use: No    Comment: Last drink "30 years ago"    Family History  Problem Relation Age of Onset  . Anemia Mother   . Hypertension Daughter   . Diabetes Daughter   . Migraines Daughter  Review of Systems  Constitutional: Negative for chills and fever.  Respiratory: Negative for cough and shortness of breath.   Cardiovascular: Negative for chest pain, palpitations and leg swelling.  Gastrointestinal: Negative for abdominal pain, nausea and vomiting.   Per hpi  OBJECTIVE:  Today's Vitals   09/20/19 1039  BP: 134/72  Pulse: 60  Temp: 98.8 F (37.1 C)  SpO2: 98%  Weight: 170 lb 9.6 oz (77.4 kg)  Height: 5\' 6"  (1.676 m)   Body mass index is 27.54 kg/m.   Physical Exam Vitals and nursing note reviewed.  Constitutional:      Appearance: He is well-developed.  HENT:     Head: Normocephalic and atraumatic.  Eyes:     Conjunctiva/sclera: Conjunctivae normal.     Pupils: Pupils are equal, round, and reactive to light.  Cardiovascular:     Rate and Rhythm: Normal rate and regular rhythm.     Heart sounds: No murmur. No friction rub. No gallop.   Pulmonary:     Effort: Pulmonary effort is normal.     Breath sounds: Normal breath sounds. No wheezing or rales.  Musculoskeletal:     Cervical back: Neck supple.  Skin:    General: Skin is warm and dry.  Neurological:     Mental Status: He is alert and oriented to person, place, and time.     No results found for this or any previous visit (from the past 24 hour(s)).  No results found.   ASSESSMENT and PLAN  1. Psychophysiological insomnia Trial of trazodone, reviewed r/se/b, declines counseling at this time  2. Hyperlipidemia, unspecified hyperlipidemia  type Checking labs today, medications will be adjusted as needed.  - Lipid panel - CMET with GFR - atorvastatin (LIPITOR) 40 MG tablet; TAKE 1 TABLET(40 MG) BY MOUTH DAILY  3. Essential hypertension, benign Controlled. Continue current regime.  - Lipid panel - CMET with GFR  4. Spinal stenosis of lumbar region with neurogenic claudication Stable. Cont current mgt - gabapentin (NEURONTIN) 300 MG capsule; TAKE 2 CAPSULES(600 MG) BY MOUTH THREE TIMES DAILY  5. Gastroesophageal reflux disease without esophagitis Stable. Cont current mgt  6. Exercise-induced bronchospasm Stable. Cont current mgt  7. Erectile dysfunction, unspecified erectile dysfunction type Stable. Cont current mgt  Other orders - albuterol (VENTOLIN HFA) 108 (90 Base) MCG/ACT inhaler; INHALE 2 PUFFS INTO THE LUNGS EVERY 6 HOURS AS NEEDED FOR WHEEZING OR SHORTNESS OF BREATH - amLODipine (NORVASC) 10 MG tablet; Take 1 tablet (10 mg total) by mouth daily. - metoprolol succinate (TOPROL-XL) 25 MG 24 hr tablet; Take 1 tablet (25 mg total) by mouth daily. - omeprazole (PRILOSEC) 20 MG capsule; TAKE 1 CAPSULE(20 MG) BY MOUTH TWICE DAILY - tiZANidine (ZANAFLEX) 4 MG tablet; Take 1 tablet (4 mg total) by mouth at bedtime as needed for muscle spasms. - verapamil (CALAN) 120 MG tablet; Take 1 tablet (120 mg total) by mouth every morning. - traZODone (DESYREL) 50 MG tablet; Take 0.5-1 tablets (25-50 mg total) by mouth at bedtime as needed for sleep. - sildenafil (VIAGRA) 100 MG tablet; TAKE 1/2 (ONE-HALF) TABLET BY MOUTH ONCE DAILY AS NEEDED FOR  ERECTILE  DYSFUNCTION  Return in about 6 months (around 03/20/2020).    Rutherford Guys, MD [facility] at [facility] [place], [place] [address] Ph.  [phone] Fax [phone]

## 2019-09-26 MED ORDER — ATORVASTATIN CALCIUM 80 MG PO TABS: 80.0000 mg | ORAL_TABLET | Freq: Every day | ORAL | 3 refills | Status: DC

## 2019-09-26 NOTE — Addendum Note (Signed)
Addended by: Rutherford Guys on: 09/26/2019 12:25 PM   Modules accepted: Orders

## 2019-10-11 ENCOUNTER — Other Ambulatory Visit: Payer: Self-pay | Admitting: Gastroenterology

## 2019-10-11 DIAGNOSIS — K7030 Alcoholic cirrhosis of liver without ascites: Secondary | ICD-10-CM

## 2019-10-19 ENCOUNTER — Ambulatory Visit
Admission: RE | Admit: 2019-10-19 | Discharge: 2019-10-19 | Disposition: A | Discharge status: Home / Self Care | Payer: Medicare Other | Source: Ambulatory Visit | Attending: Gastroenterology | Admitting: Gastroenterology

## 2019-10-19 DIAGNOSIS — K7030 Alcoholic cirrhosis of liver without ascites: Secondary | ICD-10-CM

## 2019-10-22 ENCOUNTER — Other Ambulatory Visit: Payer: Self-pay | Admitting: Cardiovascular Disease

## 2019-11-02 ENCOUNTER — Ambulatory Visit: Payer: Medicare Other | Admitting: Podiatry

## 2019-11-21 ENCOUNTER — Other Ambulatory Visit: Payer: Self-pay | Admitting: Neurology

## 2019-11-23 DIAGNOSIS — K7460 Unspecified cirrhosis of liver: Secondary | ICD-10-CM | POA: Diagnosis not present

## 2019-11-23 DIAGNOSIS — E872 Acidosis: Secondary | ICD-10-CM | POA: Diagnosis not present

## 2019-11-23 DIAGNOSIS — I129 Hypertensive chronic kidney disease with stage 1 through stage 4 chronic kidney disease, or unspecified chronic kidney disease: Secondary | ICD-10-CM | POA: Diagnosis not present

## 2019-11-23 DIAGNOSIS — N1831 Chronic kidney disease, stage 3a: Secondary | ICD-10-CM | POA: Diagnosis not present

## 2019-11-23 DIAGNOSIS — G43909 Migraine, unspecified, not intractable, without status migrainosus: Secondary | ICD-10-CM | POA: Diagnosis not present

## 2019-11-25 ENCOUNTER — Other Ambulatory Visit: Payer: Self-pay | Admitting: Family Medicine

## 2019-11-25 ENCOUNTER — Other Ambulatory Visit: Payer: Self-pay | Admitting: Cardiovascular Disease

## 2019-12-12 ENCOUNTER — Other Ambulatory Visit: Payer: Self-pay | Admitting: Family Medicine

## 2019-12-12 ENCOUNTER — Other Ambulatory Visit: Payer: Self-pay | Admitting: Neurology

## 2019-12-13 ENCOUNTER — Encounter: Payer: Self-pay | Admitting: Neurology

## 2019-12-13 NOTE — Progress Notes (Signed)
Jodan Baptist (Key: [id number]) Rx #: [id number] [name] 100MG  tablets   Form [employer] Medicare Part D Electronic Prior Authorization Form (2017 NCPDP) Created 3 hours ago Sent to Plan less than a minute ago Plan Response less than a minute ago Submit Clinical Questions Determination N/A Message from Plan This medication or product was previously approved on [id number] from 2019-09-30 to 2020-09-28. **Please note: Formulary lowering, tiering exception, cost reduction and/or pre-benefit determination review (including prospective Medicare hospice reviews) requests cannot be requested using this method of submission. Please contact us at [phone] instead.

## 2019-12-21 ENCOUNTER — Other Ambulatory Visit: Payer: Self-pay | Admitting: Family Medicine

## 2019-12-21 NOTE — Telephone Encounter (Signed)
Requested medications are due for refill today?  Yes - This medication cannot be delegated  Requested medications are on active medication list? Yes  Last Refill:   09/20/2019   # 30 with 2 refills  Future visit scheduled? Yes  Notes to Clinic:  This medication cannot be delegated.

## 2019-12-28 ENCOUNTER — Other Ambulatory Visit: Payer: Self-pay | Admitting: Neurology

## 2019-12-28 MED ORDER — UBRELVY 100 MG PO TABS: 100.0000 mg | ORAL_TABLET | ORAL | 11 refills | Status: DC

## 2019-12-28 NOTE — Telephone Encounter (Signed)
Jaime Barnes rx sent to [facility], patient aware.

## 2019-12-28 NOTE — Telephone Encounter (Signed)
Patient moved and needs a refill sent to the new [facility] store [facility] in [place]  Is the medication

## 2019-12-30 ENCOUNTER — Ambulatory Visit: Payer: Medicare Other | Attending: Internal Medicine

## 2019-12-30 DIAGNOSIS — Z23 Encounter for immunization: Secondary | ICD-10-CM

## 2019-12-30 NOTE — Progress Notes (Signed)
   Covid-19 Vaccination Clinic  Name:  Jaime Barnes    MRN: [id number] DOB: 1951-01-06  12/30/2019  [name] was observed post Covid-19 immunization for 15 minutes without incident. He was provided with Vaccine Information Sheet and instruction to access the V-Safe system.   [name] was instructed to call 911 with any severe reactions post vaccine: [name] Difficulty breathing  . Swelling of face and throat  . A fast heartbeat  . A bad rash all over body  . Dizziness and weakness   Immunizations Administered    Name Date Dose VIS Date Route   Pfizer COVID-19 Vaccine 12/30/2019 12:31 PM 0.3 mL 09/09/2019 Intramuscular   Manufacturer: [place]   Lot: [id number]   [place]: [id number]

## 2020-01-19 ENCOUNTER — Other Ambulatory Visit: Payer: Self-pay | Admitting: Family Medicine

## 2020-01-23 ENCOUNTER — Ambulatory Visit: Payer: Medicare Other | Attending: Internal Medicine

## 2020-01-23 DIAGNOSIS — Z23 Encounter for immunization: Secondary | ICD-10-CM

## 2020-01-23 NOTE — Progress Notes (Signed)
   Covid-19 Vaccination Clinic  Name:  Holley Buren    MRN: [id number] DOB: Sep 21, 1951  01/23/2020  Mr. Follett was observed post Covid-19 immunization for 15 minutes without incident. He was provided with Vaccine Information Sheet and instruction to access the V-Safe system.   Mr. Haag was instructed to call 911 with any severe reactions post vaccine: [name] Difficulty breathing  . Swelling of face and throat  . A fast heartbeat  . A bad rash all over body  . Dizziness and weakness   Immunizations Administered    Name Date Dose VIS Date Route   Pfizer COVID-19 Vaccine 01/23/2020 11:31 AM 0.3 mL 11/23/2018 Intramuscular   Manufacturer: [place]   Lot: [id number]   [place]: [id number]

## 2020-01-27 ENCOUNTER — Ambulatory Visit (INDEPENDENT_AMBULATORY_CARE_PROVIDER_SITE_OTHER): Payer: Medicare Other | Admitting: Podiatry

## 2020-01-27 ENCOUNTER — Encounter: Payer: Self-pay | Admitting: Podiatry

## 2020-01-27 ENCOUNTER — Other Ambulatory Visit: Payer: Self-pay

## 2020-01-27 VITALS — Temp 96.9°F

## 2020-01-27 DIAGNOSIS — M79675 Pain in left toe(s): Secondary | ICD-10-CM | POA: Diagnosis not present

## 2020-01-27 DIAGNOSIS — M79674 Pain in right toe(s): Secondary | ICD-10-CM

## 2020-01-27 DIAGNOSIS — B351 Tinea unguium: Secondary | ICD-10-CM

## 2020-01-27 NOTE — Progress Notes (Signed)
This patient returns to my office for at risk foot care.  This patient requires this care by a professional since this patient will be at risk due to having chronic kidney disease  This patient is unable to cut nails himself since the patient cannot reach his nails.These nails are painful walking and wearing shoes.  This patient presents for at risk foot care today.  General Appearance  Alert, conversant and in no acute stress.  Vascular  Dorsalis pedis and posterior tibial  pulses are palpable  bilaterally.  Capillary return is within normal limits  bilaterally. Temperature is within normal limits  bilaterally.  Neurologic  Senn-Weinstein monofilament wire test within normal limits  bilaterally. Muscle power within normal limits bilaterally.  Nails Thick disfigured discolored nails with subungual debris  from hallux to fifth toes bilaterally. No evidence of bacterial infection or drainage bilaterally.  Orthopedic  No limitations of motion  feet .  No crepitus or effusions noted.  No bony pathology or digital deformities noted.  Skin  normotropic skin with no porokeratosis noted bilaterally.  No signs of infections or ulcers noted.     Onychomycosis  Pain in right toes  Pain in left toes  Consent was obtained for treatment procedures.   Mechanical debridement of nails 1-5  bilaterally performed with a nail nipper.  Filed with dremel without incident.    Return office visit   10 weeks                  Told patient to return for periodic foot care and evaluation due to potential at risk complications.   Aariya Ferrick DPM  

## 2020-01-28 ENCOUNTER — Other Ambulatory Visit: Payer: Self-pay | Admitting: Family Medicine

## 2020-01-28 NOTE — Telephone Encounter (Signed)
Requested Prescriptions  Pending Prescriptions Disp Refills  . amLODipine (NORVASC) 10 MG tablet [Pharmacy Med Name: AMLODIPINE BESYLATE 10MG  TABLETS] 90 tablet 0    Sig: TAKE 1 TABLET(10 MG) BY MOUTH DAILY     Cardiovascular:  Calcium Channel Blockers Passed - 01/28/2020 10:51 AM      Passed - Last BP in normal range    BP Readings from Last 1 Encounters:  09/20/19 134/72         Passed - Valid encounter within last 6 months    Recent Outpatient Visits          4 months ago Psychophysiological insomnia   [facility], Lilia Argue, MD   10 months ago Hyperlipidemia, unspecified hyperlipidemia type   [facility], Lilia Argue, MD   1 year ago Hyperlipidemia, unspecified hyperlipidemia type   [facility], Lilia Argue, MD   1 year ago CKD (chronic kidney disease) stage 3, GFR 30-59 ml/min [facility])   [facility], Lilia Argue, MD   1 year ago Abnormal blood electrolyte level   [facility], Lilia Argue, MD      Future Appointments            In 1 month Rutherford Guys, MD Primary [facility] at [place], [facility]

## 2020-02-20 ENCOUNTER — Ambulatory Visit (INDEPENDENT_AMBULATORY_CARE_PROVIDER_SITE_OTHER): Payer: Medicare Other | Admitting: Family Medicine

## 2020-02-20 VITALS — BP 134/72 | Ht 66.0 in | Wt 170.0 lb

## 2020-02-20 DIAGNOSIS — Z0000 Encounter for general adult medical examination without abnormal findings: Secondary | ICD-10-CM | POA: Diagnosis not present

## 2020-02-20 NOTE — Patient Instructions (Addendum)
Thank you for taking time to come for your Medicare Wellness Visit. I appreciate your ongoing commitment to your health goals. Please review the following plan we discussed and let me know if I can assist you in the future.  Leroy Kennedy LPN  Preventive Care 70 Years and Older, Male Preventive care refers to lifestyle choices and visits with your health care provider that can promote health and wellness. This includes:  A yearly physical exam. This is also called an annual well check.  Regular dental and eye exams.  Immunizations.  Screening for certain conditions.  Healthy lifestyle choices, such as diet and exercise. What can I expect for my preventive care visit? Physical exam Your health care provider will check:  Height and weight. These may be used to calculate body mass index (BMI), which is a measurement that tells if you are at a healthy weight.  Heart rate and blood pressure.  Your skin for abnormal spots. Counseling Your health care provider may ask you questions about:  Alcohol, tobacco, and drug use.  Emotional well-being.  Home and relationship well-being.  Sexual activity.  Eating habits.  History of falls.  Memory and ability to understand (cognition).  Work and work Statistician. What immunizations do I need?  Influenza (flu) vaccine  This is recommended every year. Tetanus, diphtheria, and pertussis (Tdap) vaccine  You may need a Td booster every 10 years. Varicella (chickenpox) vaccine  You may need this vaccine if you have not already been vaccinated. Zoster (shingles) vaccine  You may need this after age 66. Pneumococcal conjugate (PCV13) vaccine  One dose is recommended after age 84. Pneumococcal polysaccharide (PPSV23) vaccine  One dose is recommended after age 88. Measles, mumps, and rubella (MMR) vaccine  You may need at least one dose of MMR if you were born in 1957 or later. You may also need a second dose. Meningococcal  conjugate (MenACWY) vaccine  You may need this if you have certain conditions. Hepatitis A vaccine  You may need this if you have certain conditions or if you travel or work in places where you may be exposed to hepatitis A. Hepatitis B vaccine  You may need this if you have certain conditions or if you travel or work in places where you may be exposed to hepatitis B. Haemophilus influenzae type b (Hib) vaccine  You may need this if you have certain conditions. You may receive vaccines as individual doses or as more than one vaccine together in one shot (combination vaccines). Talk with your health care provider about the risks and benefits of combination vaccines. What tests do I need? Blood tests  Lipid and cholesterol levels. These may be checked every 5 years, or more frequently depending on your overall health.  Hepatitis C test.  Hepatitis B test. Screening  Lung cancer screening. You may have this screening every year starting at age 24 if you have a 30-pack-year history of smoking and currently smoke or have quit within the past 15 years.  Colorectal cancer screening. All adults should have this screening starting at age 52 and continuing until age 61. Your health care provider may recommend screening at age 57 if you are at increased risk. You will have tests every 1-10 years, depending on your results and the type of screening test.  Prostate cancer screening. Recommendations will vary depending on your family history and other risks.  Diabetes screening. This is done by checking your blood sugar (glucose) after you have not eaten for  a while (fasting). You may have this done every 1-3 years.  Abdominal aortic aneurysm (AAA) screening. You may need this if you are a current or former smoker.  Sexually transmitted disease (STD) testing. Follow these instructions at home: Eating and drinking  Eat a diet that includes fresh fruits and vegetables, whole grains, lean  protein, and low-fat dairy products. Limit your intake of foods with high amounts of sugar, saturated fats, and salt.  Take vitamin and mineral supplements as recommended by your health care provider.  Do not drink alcohol if your health care provider tells you not to drink.  If you drink alcohol: ? Limit how much you have to 0-2 drinks a day. ? Be aware of how much alcohol is in your drink. In the [place] one drink equals one 12 oz bottle of beer (355 mL), one 5 oz glass of wine (148 mL), or one 1 oz glass of hard liquor (44 mL). Lifestyle  Take daily care of your teeth and gums.  Stay active. Exercise for at least 30 minutes on 5 or more days each week.  Do not use any products that contain nicotine or tobacco, such as cigarettes, e-cigarettes, and chewing tobacco. If you need help quitting, ask your health care provider.  If you are sexually active, practice safe sex. Use a condom or other form of protection to prevent STIs (sexually transmitted infections).  Talk with your health care provider about taking a low-dose aspirin or statin. What's next?  Visit your health care provider once a year for a well check visit.  Ask your health care provider how often you should have your eyes and teeth checked.  Stay up to date on all vaccines. This information is not intended to replace advice given to you by your health care provider. Make sure you discuss any questions you have with your health care provider. Document Revised: 09/09/2018 Document Reviewed: 09/09/2018 Elsevier Patient Education  2020 [employer]  

## 2020-02-20 NOTE — Progress Notes (Signed)
Presents today for [employer] Visit   Date of last exam: 09/20/2019  Interpreter used for this visit?  No  I connected with  Jaime Barnes on 02/20/20 by a telephone  and verified that I am speaking with the correct person using two identifiers.   I discussed the limitations of evaluation and management by telemedicine. The patient expressed understanding and agreed to proceed.    Patient Care Team: Rutherford Guys, MD as PCP - General (Family Medicine) Skeet Latch, MD as PCP - Cardiology (Cardiology) Lovena Neighbours, DMD (Dentistry) Skeet Latch, MD as Attending Physician (Cardiology) Wilford Corner, MD as Consulting Physician (Gastroenterology) Pieter Partridge, DO as Consulting Physician (Neurology)   Other items to address today:   Discussed immunizations  Discussed Eye/Dental Will discuss Colonoscopy at follow up 6/14 @ 9:20 Dr. Pamella Pert  Other Screening:  Last lipid screening: 09/20/2019  ADVANCE DIRECTIVES: Discussed: yes On File: yes Materials Provided: no  Immunization status:  Immunization History  Administered Date(s) Administered  . Fluad Quad(high Dose 65+) 08/15/2019  . Influenza Split 08/24/2012  . Influenza Whole 09/02/2007, 06/23/2008, 06/09/2011  . Influenza, High Dose Seasonal PF 06/27/2017, 06/25/2018  . Influenza,inj,Quad PF,6+ Mos 07/29/2013  . Influenza-Unspecified 08/24/2012, 08/29/2016, 06/27/2017  . PFIZER SARS-COV-2 Vaccination 12/30/2019, 01/23/2020  . Pneumococcal Conjugate-13 03/18/2016  . Pneumococcal Polysaccharide-23 03/27/2017  . Tdap 06/09/2011     Health Maintenance Due  Topic Date Due  . COLONOSCOPY  02/06/2020     Functional Status Survey: Is the patient deaf or have difficulty hearing?: No Does the patient have difficulty seeing, even when wearing glasses/contacts?: No Does the patient have difficulty concentrating, remembering, or making decisions?: No Does the patient have  difficulty walking or climbing stairs?: No Does the patient have difficulty dressing or bathing?: No Does the patient have difficulty doing errands alone such as visiting a doctor's office or shopping?: No   6CIT Screen 02/20/2020 02/14/2019  What Year? 0 points 0 points  What month? 0 points 0 points  What time? - 0 points  Count back from 20 - 0 points  Months in reverse 4 points 0 points  Repeat phrase - 2 points  Total Score - 2        Clinical Support from 02/20/2020 in [facility]  AUDIT-C Score  0       Home Environment:   Lives in one story home No trouble climbing stairs No grab bars  No scattered rugs Adequate lighting/ no clutter    Patient Active Problem List   Diagnosis Date Noted  . Pain due to onychomycosis of toenails of both feet 03/30/2019  . Hypertriglyceridemia 01/21/2019  . Radiculopathy of lumbar region 08/19/2017  . CKD (chronic kidney disease) stage 3, GFR 30-59 ml/min 03/03/2017  . Exercise-induced bronchospasm 11/11/2016  . Migraine without aura and without status migrainosus, not intractable 12/20/2015  . Back pain 05/06/2015  . Constipation 05/06/2015  . Neuropathic pain 04/19/2015  . Syncope and collapse 04/19/2015  . Bilateral calf pain 04/19/2015  . Dizziness 11/02/2014  . Dystrophic nail 08/23/2013  . Tibialis posterior tendinitis 05/03/2013  . Anemia 08/24/2012  . Plaque psoriasis 08/24/2012  . Breast pain, left 11/27/2011  . Ejaculatory disorder 04/22/2011  . CAD (coronary artery disease) 03/28/2010  . HERPES SIMPLEX INFECTION 06/29/2009  . BARRETTS ESOPHAGUS 06/12/2008  . TOBACCO USER 03/11/2007  . Hyperlipidemia 11/26/2006  . BIPOLAR DISORDER 11/26/2006  . Migraine headache 11/26/2006  . HYPERTENSION, BENIGN SYSTEMIC 11/26/2006  .  HEMORRHOIDS, NOS 11/26/2006  . PEPTIC ULCER DIS., UNSPEC. W/O OBSTRUCTION 11/26/2006  . DIVERTICULOSIS OF COLON 11/26/2006  . CIRRHOSIS, ALCOHOLIC [id number]  . PANCREATITIS, CHRONIC  11/26/2006  . IMPOTENCE, ORGANIC 11/26/2006  . Insomnia 11/26/2006     Past Medical History:  Diagnosis Date  . Barrett's esophagus 2011   Recommend repeat EGD for surveillance in 2 yrs-[facility]  . Bipolar disorder ([place])   . Chronic kidney disease   . Cirrhosis ([place])   . Diverticulosis of colon   . GANGLION CYST, TENDON SHEATH 06/26/2010  . Hyperlipidemia   . Hypertension   . Hypertriglyceridemia 01/21/2019  . Impotence   . Migraine   . Pancreatitis   . Pulmonary nodule   . Pulmonary nodule seen on imaging study 02/06/2011   Followup CT in December 2014 with 20 months of stability at 4 mm.  No further imaging recommended      Past Surgical History:  Procedure Laterality Date  . CARDIAC CATHETERIZATION  2005   Nonobstructive, <40%  . TONSILLECTOMY Bilateral 1961     Family History  Problem Relation Age of Onset  . Anemia Mother   . Hypertension Daughter   . Diabetes Daughter   . Migraines Daughter      Social History   Socioeconomic History  . Marital status: Legally Separated    Spouse name: Not on file  . Number of children: 8  . Years of education: 8  . Highest education level: 8th grade  Occupational History  . Not on file  Tobacco Use  . Smoking status: Light Tobacco Smoker    Packs/day: 0.25    Years: 53.00    Pack years: 13.25    Types: Cigars    Start date: 09/29/1962    Last attempt to quit: 08/04/2016    Years since quitting: 3.5  . Smokeless tobacco: Former Systems developer    Quit date: 03/08/2017  . Tobacco comment: cigar occassionally   Substance and Sexual Activity  . Alcohol use: No    Comment: Last drink "30 years ago"  . 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   [name] Sexual activity: Yes    Birth control/protection: Condom    Comment: Multiple sex partners. 60 in last year.  Other Topics Concern  . Not on file  Social History Narrative   Pt lives alone. No stairs in home. Pt has completed 8th grade.        Caffeine 3 cups coffee day      Exercise - yes walks and plays tennis   Social Determinants of Health   Financial Resource Strain:   . Difficulty of Paying Living Expenses:   Food Insecurity:   . Worried About Charity fundraiser in the Last Year:   . Arboriculturist in the Last Year:   Transportation Needs:   . Film/video editor (Medical):   [name] Lack of Transportation (Non-Medical):   Physical Activity:   . Days of Exercise per Week:   . Minutes of Exercise per Session:   Stress:   . Feeling of Stress :   Social Connections:   . Frequency of Communication with Friends and Family:   . Frequency of Social Gatherings with Friends and Family:   . Attends Religious Services:   . Active Member of Clubs or Organizations:   . Attends Archivist Meetings:   [name] Marital Status:   Intimate Partner Violence:   .  Fear of Current or Ex-Partner:   . Emotionally Abused:   [name] Physically Abused:   . Sexually Abused:      Allergies  Allergen Reactions  . Codeine Phosphate Other (See Comments)    REACTION: breathing difficulty  . Penicillins Rash and Other (See Comments)    Break out in sweats/ Rash amoxicillin. Denies airway involvement Has patient had a PCN reaction causing immediate rash, facial/tongue/throat swelling, SOB or lightheadedness with hypotension: Yes Has patient had a PCN reaction causing severe rash involving mucus membranes or skin necrosis: Yes Has patient had a PCN reaction that required hospitalization No Has patient had a PCN reaction occurring within the last 10 years: No If all of the above answers are "NO", then may proceed with Cephalosporin u     Prior to Admission medications   Medication Sig Start Date End Date Taking? Authorizing Provider  albuterol (VENTOLIN HFA) 108 (90 Base) MCG/ACT inhaler INHALE 2 PUFFS INTO THE LUNGS EVERY 6 HOURS AS NEEDED FOR WHEEZING OR SHORTNESS OF BREATH 01/19/20  Yes Rutherford Guys, MD  amLODipine (NORVASC) 10 MG  tablet TAKE 1 TABLET(10 MG) BY MOUTH DAILY 01/28/20  Yes Rutherford Guys, MD  aspirin 81 MG chewable tablet Chew 81 mg by mouth daily.   Yes [provider]  atorvastatin (LIPITOR) 80 MG tablet Take 1 tablet (80 mg total) by mouth daily at 6 PM. TAKE 1 TABLET(40 MG) BY MOUTH DAILY 09/26/19  Yes Rutherford Guys, MD  gabapentin (NEURONTIN) 300 MG capsule TAKE 2 CAPSULES(600 MG) BY MOUTH THREE TIMES DAILY 09/20/19  Yes Rutherford Guys, MD  HYDROcodone-acetaminophen (NORCO/VICODIN) 5-325 MG tablet TK 1 T PO QID 04/25/19  Yes [provider]  ibuprofen (ADVIL) 800 MG tablet  04/13/19  Yes [provider]  MAGNESIUM-OXIDE 400 (241.3 Mg) MG tablet TAKE 1 TABLET BY MOUTH EVERY DAY 11/28/19  Yes Skeet Latch, MD  meclizine (ANTIVERT) 12.5 MG tablet TAKE 1 TABLET BY MOUTH THREE TIMES DAILY AS NEEDED FOR DIZZINESS 12/30/17  Yes Sherene Sires, DO  metoprolol succinate (TOPROL-XL) 25 MG 24 hr tablet Take 1 tablet (25 mg total) by mouth daily. 09/20/19  Yes Rutherford Guys, MD  omeprazole (PRILOSEC) 20 MG capsule TAKE 1 CAPSULE(20 MG) BY MOUTH TWICE DAILY 12/12/19  Yes Rutherford Guys, MD  sildenafil (VIAGRA) 100 MG tablet TAKE 1/2 (ONE-HALF) TABLET BY MOUTH ONCE DAILY AS NEEDED FOR  ERECTILE  DYSFUNCTION 09/20/19  Yes Rutherford Guys, MD  tiZANidine (ZANAFLEX) 4 MG tablet TAKE 1 TABLET(4 MG) BY MOUTH AT BEDTIME AS NEEDED FOR MUSCLE SPASMS 12/21/19  Yes Rutherford Guys, MD  topiramate (TOPAMAX) 100 MG tablet TAKE 1 TABLET(100 MG) BY MOUTH TWICE DAILY 06/16/19  Yes Tomi Likens, Adam R, DO  traZODone (DESYREL) 50 MG tablet Take 0.5-1 tablets (25-50 mg total) by mouth at bedtime as needed for sleep. 09/20/19  Yes Rutherford Guys, MD  Ubrogepant (UBRELVY) 100 MG TABS Take 100 mg by mouth as directed. TAKE 1 TABLET BY MOUTH EVERY 2 HOURS AS NEEDED(ONCE). MAXIMUM 2 TABLETS IN 24 HOURS 12/28/19  Yes Jaffe, Adam R, DO  verapamil (CALAN) 120 MG tablet Take 1 tablet (120 mg total) by mouth every morning.  09/20/19  Yes Rutherford Guys, MD     Depression screen [facility] 2/9 02/20/2020 09/20/2019 03/24/2019 02/14/2019 01/26/2019  Decreased Interest 0 0 0 0 0  Down, Depressed, Hopeless 0 0 0 0 0  PHQ - 2 Score 0 0 0 0 0  Altered sleeping - - - - -  Tired, decreased energy - - - - -  Change in appetite - - - - -  Feeling bad or failure about yourself  - - - - -  Trouble concentrating - - - - -  Moving slowly or fidgety/restless - - - - -  Suicidal thoughts - - - - -  PHQ-9 Score - - - - -  Difficult doing work/chores - - - - -  Some recent data might be hidden     Fall Risk  02/20/2020 09/20/2019 06/16/2019 03/24/2019 02/14/2019  Falls in the past year? 1 0 0 0 0  Number falls in past yr: 0 0 - 0 0  Comment slipped on grass / no injury - - - -  Injury with Fall? 0 0 - 0 0  Follow up Falls evaluation completed;Education provided - - - -      PHYSICAL EXAM: BP 134/72 Comment: taken from a previous visit  Ht 5\' 6"  (1.676 m)   Wt 170 lb (77.1 kg)   BMI 27.44 kg/m    Wt Readings from Last 3 Encounters:  02/20/20 170 lb (77.1 kg)  09/20/19 170 lb 9.6 oz (77.4 kg)  07/28/19 172 lb (78 kg)   Medicare annual wellness visit, subsequent     Education/Counseling provided regarding diet and exercise, prevention of chronic diseases, smoking/tobacco cessation, if applicable, and reviewed "Covered Medicare Preventive Services."

## 2020-02-23 ENCOUNTER — Other Ambulatory Visit: Payer: Self-pay | Admitting: Family Medicine

## 2020-02-23 DIAGNOSIS — I10 Essential (primary) hypertension: Secondary | ICD-10-CM

## 2020-02-23 DIAGNOSIS — K219 Gastro-esophageal reflux disease without esophagitis: Secondary | ICD-10-CM

## 2020-03-06 ENCOUNTER — Other Ambulatory Visit: Payer: Self-pay | Admitting: Family Medicine

## 2020-03-11 ENCOUNTER — Other Ambulatory Visit: Payer: Self-pay | Admitting: Neurology

## 2020-03-12 ENCOUNTER — Ambulatory Visit (INDEPENDENT_AMBULATORY_CARE_PROVIDER_SITE_OTHER): Payer: Medicare Other | Admitting: Family Medicine

## 2020-03-12 ENCOUNTER — Encounter: Payer: Self-pay | Admitting: Family Medicine

## 2020-03-12 ENCOUNTER — Other Ambulatory Visit: Payer: Self-pay

## 2020-03-12 VITALS — BP 120/71 | HR 65 | Temp 98.6°F | Resp 16 | Ht 66.0 in | Wt 174.0 lb

## 2020-03-12 DIAGNOSIS — R238 Other skin changes: Secondary | ICD-10-CM | POA: Diagnosis not present

## 2020-03-12 DIAGNOSIS — R7309 Other abnormal glucose: Secondary | ICD-10-CM

## 2020-03-12 DIAGNOSIS — G8929 Other chronic pain: Secondary | ICD-10-CM

## 2020-03-12 DIAGNOSIS — M25511 Pain in right shoulder: Secondary | ICD-10-CM

## 2020-03-12 DIAGNOSIS — N1831 Chronic kidney disease, stage 3a: Secondary | ICD-10-CM | POA: Diagnosis not present

## 2020-03-12 DIAGNOSIS — E785 Hyperlipidemia, unspecified: Secondary | ICD-10-CM | POA: Diagnosis not present

## 2020-03-12 DIAGNOSIS — Z1211 Encounter for screening for malignant neoplasm of colon: Secondary | ICD-10-CM

## 2020-03-12 DIAGNOSIS — G5701 Lesion of sciatic nerve, right lower limb: Secondary | ICD-10-CM

## 2020-03-12 DIAGNOSIS — I10 Essential (primary) hypertension: Secondary | ICD-10-CM | POA: Diagnosis not present

## 2020-03-12 MED ORDER — VALACYCLOVIR HCL 1 G PO TABS: 1000.0000 mg | ORAL_TABLET | Freq: Every day | ORAL | 4 refills | Status: DC

## 2020-03-12 MED ORDER — ATORVASTATIN CALCIUM 80 MG PO TABS: 80.0000 mg | ORAL_TABLET | Freq: Every day | ORAL | 3 refills | Status: DC

## 2020-03-12 MED ORDER — GABAPENTIN 300 MG PO CAPS: ORAL_CAPSULE | ORAL | 5 refills | Status: DC

## 2020-03-12 NOTE — Progress Notes (Signed)
6/14/20219:55 AM  Jaime Barnes [birth year], 69 y.o., male [id number]  Chief Complaint  Patient presents with  . Shoulder Pain    RIGHT for 1 month radiates down arm with tingling of RIGHT pinky  . Hip Pain    for 1 month RIGHT radiates down to leg and a bump on RIGHT cheek    HPI:   Patient is a 69 y.o. male with past medical history significant for HTN, PAD, HLP, CKD3, non obstructive CAD, barretts, cirrhosis and migraines who presents today for routine followup   Last OV dec 2020 -started atorvastatin but unfortunately sent to wrong pharmacy  Right shoulder pain radiating down to pinkie, numbness and tingling Having right sided buttock pain that radiates down his right thigh, he denies any low back pain He has been taking gabapentin 1 capsule TID Pain for about a month He denies any inciting event Sometimes mild right neck pain  Brings in report done by medicare nurse a1c 6.6  Reports recurring itchy tingling bumps on right buttocks for past several years Most recently started several days ago   Depression screen [facility] 2/9 03/12/2020 02/20/2020 09/20/2019  Decreased Interest 0 0 0  Down, Depressed, Hopeless 0 0 0  PHQ - 2 Score 0 0 0  Altered sleeping - - -  Tired, decreased energy - - -  Change in appetite - - -  Feeling bad or failure about yourself  - - -  Trouble concentrating - - -  Moving slowly or fidgety/restless - - -  Suicidal thoughts - - -  PHQ-9 Score - - -  Difficult doing work/chores - - -  Some recent data might be hidden    Fall Risk  03/12/2020 02/20/2020 09/20/2019 06/16/2019 03/24/2019  Falls in the past year? 0 1 0 0 0  Number falls in past yr: - 0 0 - 0  Comment - slipped on grass / no injury - - -  Injury with Fall? - 0 0 - 0  Follow up Falls evaluation completed Falls evaluation completed;Education provided - - -     Allergies  Allergen Reactions  . Codeine Phosphate Other (See Comments)    REACTION: breathing difficulty  . Penicillins  Rash and Other (See Comments)    Break out in sweats/ Rash amoxicillin. Denies airway involvement Has patient had a PCN reaction causing immediate rash, facial/tongue/throat swelling, SOB or lightheadedness with hypotension: Yes Has patient had a PCN reaction causing severe rash involving mucus membranes or skin necrosis: Yes Has patient had a PCN reaction that required hospitalization No Has patient had a PCN reaction occurring within the last 10 years: No If all of the above answers are "NO", then may proceed with Cephalosporin u    Prior to Admission medications   Medication Sig Start Date End Date Taking? Authorizing Provider  albuterol (VENTOLIN HFA) 108 (90 Base) MCG/ACT inhaler INHALE 2 PUFFS INTO THE LUNGS EVERY 6 HOURS AS NEEDED FOR WHEEZING OR SHORTNESS OF BREATH 01/19/20  Yes Rutherford Guys, MD  amLODipine (NORVASC) 10 MG tablet TAKE 1 TABLET(10 MG) BY MOUTH DAILY 01/28/20  Yes Rutherford Guys, MD  aspirin 81 MG chewable tablet Chew 81 mg by mouth daily.   Yes [provider]  gabapentin (NEURONTIN) 300 MG capsule TAKE 2 CAPSULES(600 MG) BY MOUTH THREE TIMES DAILY 09/20/19  Yes Rutherford Guys, MD  HYDROcodone-acetaminophen (NORCO/VICODIN) 5-325 MG tablet TK 1 T PO QID 04/25/19  Yes [provider]  ibuprofen (ADVIL) 800 MG  tablet  04/13/19  Yes [provider]  MAGNESIUM-OXIDE 400 (241.3 Mg) MG tablet TAKE 1 TABLET BY MOUTH EVERY DAY 11/28/19  Yes Skeet Latch, MD  meclizine (ANTIVERT) 12.5 MG tablet TAKE 1 TABLET BY MOUTH THREE TIMES DAILY AS NEEDED FOR DIZZINESS 12/30/17  Yes Sherene Sires, DO  metoprolol succinate (TOPROL-XL) 25 MG 24 hr tablet Take 1 tablet (25 mg total) by mouth daily. 09/20/19  Yes Rutherford Guys, MD  omeprazole (PRILOSEC) 20 MG capsule TAKE 1 CAPSULE(20 MG) BY MOUTH TWICE DAILY 02/23/20  Yes Rutherford Guys, MD  sildenafil (VIAGRA) 100 MG tablet TAKE 1/2 (ONE-HALF) TABLET BY MOUTH ONCE DAILY AS NEEDED FOR  ERECTILE  DYSFUNCTION  09/20/19  Yes Rutherford Guys, MD  tiZANidine (ZANAFLEX) 4 MG tablet TAKE 1 TABLET(4 MG) BY MOUTH AT BEDTIME AS NEEDED FOR MUSCLE SPASMS 12/21/19  Yes Rutherford Guys, MD  topiramate (TOPAMAX) 100 MG tablet TAKE 1 TABLET(100 MG) BY MOUTH TWICE DAILY 03/12/20  Yes Jaffe, Adam R, DO  traZODone (DESYREL) 50 MG tablet TAKE 1/2 TO 1 TABLET(25 TO 50 MG) BY MOUTH AT BEDTIME AS NEEDED FOR SLEEP 03/06/20  Yes Rutherford Guys, MD  Ubrogepant (UBRELVY) 100 MG TABS Take 100 mg by mouth as directed. TAKE 1 TABLET BY MOUTH EVERY 2 HOURS AS NEEDED(ONCE). MAXIMUM 2 TABLETS IN 24 HOURS 12/28/19  Yes Jaffe, Adam R, DO  verapamil (CALAN) 120 MG tablet TAKE 1 TABLET(120 MG) BY MOUTH EVERY MORNING 02/23/20  Yes Rutherford Guys, MD  atorvastatin (LIPITOR) 80 MG tablet Take 1 tablet (80 mg total) by mouth daily at 6 PM. TAKE 1 TABLET(40 MG) BY MOUTH DAILY Patient not taking: Reported on 03/12/2020 09/26/19   Rutherford Guys, MD    Past Medical History:  Diagnosis Date  . Barrett's esophagus 2011   Recommend repeat EGD for surveillance in 2 yrs-[facility]  . Bipolar disorder ([place])   . Chronic kidney disease   . Cirrhosis ([place])   . Diverticulosis of colon   . GANGLION CYST, TENDON SHEATH 06/26/2010  . Hyperlipidemia   . Hypertension   . Hypertriglyceridemia 01/21/2019  . Impotence   . Migraine   . Pancreatitis   . Pulmonary nodule   . Pulmonary nodule seen on imaging study 02/06/2011   Followup CT in December 2014 with 20 months of stability at 4 mm.  No further imaging recommended     Past Surgical History:  Procedure Laterality Date  . CARDIAC CATHETERIZATION  2005   Nonobstructive, <40%  . TONSILLECTOMY Bilateral 1961    Social History   Tobacco Use  . Smoking status: Light Tobacco Smoker    Packs/day: 0.25    Years: 53.00    Pack years: 13.25    Types: Cigars    Start date: 09/29/1962    Last attempt to quit: 08/04/2016    Years since quitting: 3.6  . Smokeless tobacco: Former Systems developer    Quit date:  03/08/2017  . Tobacco comment: cigar occassionally   Substance Use Topics  . Alcohol use: No    Comment: Last drink "30 years ago"    Family History  Problem Relation Age of Onset  . Anemia Mother   . Hypertension Daughter   . Diabetes Daughter   . Migraines Daughter     Review of Systems  Constitutional: Negative for chills and fever.  Respiratory: Negative for cough and shortness of breath.   Cardiovascular: Negative for chest pain, palpitations and leg swelling.  Gastrointestinal: Negative  for abdominal pain, nausea and vomiting.   Per hpi  OBJECTIVE:  Today's Vitals   03/12/20 0936  BP: 120/71  Pulse: 65  Resp: 16  Temp: 98.6 F (37 C)  TempSrc: Temporal  SpO2: 96%  Weight: 174 lb (78.9 kg)  Height: 5\' 6"  (1.676 m)   Body mass index is 28.08 kg/m.   Physical Exam Vitals and nursing note reviewed.  Constitutional:      Appearance: He is well-developed.  HENT:     Head: Normocephalic and atraumatic.  Eyes:     Conjunctiva/sclera: Conjunctivae normal.     Pupils: Pupils are equal, round, and reactive to light.  Cardiovascular:     Rate and Rhythm: Normal rate and regular rhythm.     Heart sounds: No murmur heard.  No friction rub. No gallop.   Pulmonary:     Effort: Pulmonary effort is normal.     Breath sounds: Normal breath sounds. No wheezing or rales.  Musculoskeletal:     Right shoulder: Tenderness present. No swelling or bony tenderness. Decreased range of motion. Normal strength. Normal pulse.     Left shoulder: Normal.     Cervical back: Full passive range of motion without pain and neck supple. No spinous process tenderness or muscular tenderness.     Lumbar back: Tenderness (right piriformis) present. No bony tenderness. Normal range of motion. Negative right straight leg raise test and negative left straight leg raise test.     Comments: Decreased ROM in all planes, with diffuse TTP,  pos drop arch test, hawkins and neers tests Neg spurling  test   Skin:    General: Skin is warm and dry.     Findings: Rash (upper right buttock with cluster of vesicles over erythematous base) present.  Neurological:     Mental Status: He is alert and oriented to person, place, and time.     No results found for this or any previous visit (from the past 24 hour(s)).  No results found.   ASSESSMENT and PLAN  1. Vesicular rash For rx valtrex - Herpes culture, rapid  2. Abnormal glucose level - Hemoglobin A1c  3. Screening for colon cancer - Cologuard  4. HYPERTENSION, BENIGN SYSTEMIC Controlled. Continue current regime.   5. Stage 3a chronic kidney disease - Comprehensive metabolic panel  6. Hyperlipidemia, unspecified hyperlipidemia type Checking labs today, medications will be adjusted as needed.  - Lipid panel - atorvastatin (LIPITOR) 80 MG tablet; Take 1 tablet (80 mg total) by mouth daily at 6 PM. TAKE 1 TABLET(40 MG) BY MOUTH DAILY  7. Chronic right shoulder pain - Ambulatory referral to Orthopedic Surgery  8. Piriformis syndrome, right - gabapentin (NEURONTIN) 300 MG capsule; TAKE 2 CAPSULES(600 MG) BY MOUTH THREE TIMES DAILY  Other orders - valACYclovir (VALTREX) 1000 MG tablet; Take 1 tablet (1,000 mg total) by mouth daily.  Return in about 3 months (around 06/12/2020).    Rutherford Guys, MD [facility] at [facility] [place], [place] [address] Ph.  [phone] Fax [phone]

## 2020-03-12 NOTE — Patient Instructions (Signed)
° ° ° °  If you have lab work done today you will be contacted with your lab results within the next 2 weeks.  If you have not heard from us then please contact us. The fastest way to get your results is to register for My Chart. ° ° °IF you received an x-ray today, you will receive an invoice from [facility]. Please contact [facility] at [phone] with questions or concerns regarding your invoice.  ° °IF you received labwork today, you will receive an invoice from [facility]. Please contact [facility] at [phone] with questions or concerns regarding your invoice.  ° °Our billing staff will not be able to assist you with questions regarding bills from these companies. ° °You will be contacted with the lab results as soon as they are available. The fastest way to get your results is to activate your My Chart account. Instructions are located on the last page of this paperwork. If you have not heard from us regarding the results in 2 weeks, please contact this office. °  ° ° ° °

## 2020-03-13 LAB — HEMOGLOBIN A1C
Est. average glucose Bld gHb Est-mCnc: 120 mg/dL
Hgb A1c MFr Bld: 5.8 % — ABNORMAL HIGH (ref 4.8–5.6)

## 2020-03-13 LAB — COMPREHENSIVE METABOLIC PANEL
ALT: 12 IU/L (ref 0–44)
AST: 17 IU/L (ref 0–40)
Albumin/Globulin Ratio: 1.7 (ref 1.2–2.2)
Albumin: 4.6 g/dL (ref 3.8–4.8)
Alkaline Phosphatase: 102 IU/L (ref 48–121)
BUN/Creatinine Ratio: 10 (ref 10–24)
BUN: 16 mg/dL (ref 8–27)
Bilirubin Total: 0.3 mg/dL (ref 0.0–1.2)
CO2: 16 mmol/L — ABNORMAL LOW (ref 20–29)
Calcium: 9.4 mg/dL (ref 8.6–10.2)
Chloride: 108 mmol/L — ABNORMAL HIGH (ref 96–106)
Creatinine, Ser: 1.62 mg/dL — ABNORMAL HIGH (ref 0.76–1.27)
GFR calc Af Amer: 49 mL/min/{1.73_m2} — ABNORMAL LOW (ref >59)
GFR calc non Af Amer: 43 mL/min/{1.73_m2} — ABNORMAL LOW (ref >59)
Globulin, Total: 2.7 g/dL (ref 1.5–4.5)
Glucose: 95 mg/dL (ref 65–99)
Potassium: 4.3 mmol/L (ref 3.5–5.2)
Sodium: 142 mmol/L (ref 134–144)
Total Protein: 7.3 g/dL (ref 6.0–8.5)

## 2020-03-13 LAB — LIPID PANEL
Chol/HDL Ratio: 3.4 ratio (ref 0.0–5.0)
Cholesterol, Total: 100 mg/dL (ref 100–199)
HDL: 29 mg/dL — ABNORMAL LOW (ref >39)
LDL Chol Calc (NIH): 36 mg/dL (ref 0–99)
Triglycerides: 220 mg/dL — ABNORMAL HIGH (ref 0–149)
VLDL Cholesterol Cal: 35 mg/dL (ref 5–40)

## 2020-03-17 LAB — HERPES CULTURE, RAPID

## 2020-03-19 ENCOUNTER — Other Ambulatory Visit: Payer: Self-pay

## 2020-03-19 ENCOUNTER — Telehealth: Payer: Self-pay

## 2020-03-19 DIAGNOSIS — F5104 Psychophysiologic insomnia: Secondary | ICD-10-CM

## 2020-03-19 MED ORDER — TRAZODONE HCL 100 MG PO TABS: 100.0000 mg | ORAL_TABLET | Freq: Every day | ORAL | 0 refills | Status: DC

## 2020-03-19 NOTE — Telephone Encounter (Signed)
Trazodone 100mg  has been approved by Kathrin Ruddy NP

## 2020-03-19 NOTE — Telephone Encounter (Signed)
Spoke with pt, verbalized understanding of results given. States that he already being seen by nephrology. Pt also states that Dr. Pamella Pert was to increase sleep med from trazadone 50mg  to higher dose. Spoke with NP Orland Mustard about the sleep medication. Medication will be sent once reviewed by NP

## 2020-03-20 ENCOUNTER — Ambulatory Visit: Payer: Medicare Other | Admitting: Family Medicine

## 2020-03-22 ENCOUNTER — Encounter: Payer: Self-pay | Admitting: Orthopaedic Surgery

## 2020-03-22 ENCOUNTER — Ambulatory Visit: Payer: Self-pay

## 2020-03-22 ENCOUNTER — Ambulatory Visit (INDEPENDENT_AMBULATORY_CARE_PROVIDER_SITE_OTHER): Payer: Medicare Other | Admitting: Orthopaedic Surgery

## 2020-03-22 ENCOUNTER — Telehealth: Payer: Self-pay | Admitting: Orthopaedic Surgery

## 2020-03-22 DIAGNOSIS — M542 Cervicalgia: Secondary | ICD-10-CM | POA: Diagnosis not present

## 2020-03-22 DIAGNOSIS — M79601 Pain in right arm: Secondary | ICD-10-CM

## 2020-03-22 MED ORDER — METAXALONE 800 MG PO TABS
800.0000 mg | ORAL_TABLET | Freq: Three times a day (TID) | ORAL | 0 refills | Status: DC
Start: 2020-03-22 — End: 2020-06-11

## 2020-03-22 MED ORDER — PREDNISONE 10 MG (21) PO TBPK: ORAL_TABLET | ORAL | 0 refills | Status: DC

## 2020-03-22 MED ORDER — METHOCARBAMOL 750 MG PO TABS
750.0000 mg | ORAL_TABLET | Freq: Two times a day (BID) | ORAL | 3 refills | Status: DC | PRN
Start: 2020-03-22 — End: 2020-06-07

## 2020-03-22 NOTE — Progress Notes (Signed)
Office Visit Note   Patient: Jaime Barnes           Date of Birth: 25-Mar-1951           MRN: [id number] Visit Date: 03/22/2020              Requested by: Rutherford Guys, MD [address] [place],  [place] [address] PCP: Rutherford Guys, MD   Assessment & Plan: Visit Diagnoses:  1. Neck pain   2. Right arm pain     Plan: Impression is cervical radiculopathy.  I have sent a prescription for prednisone Dosepak, Skelaxin, outpatient PT.  Patient instructed to follow-up in about 6 weeks if he does not feel any relief.  Follow-Up Instructions: Return if symptoms worsen or fail to improve.   Orders:  Orders Placed This Encounter  Procedures   XR Cervical Spine 2 or 3 views   Ambulatory referral to Physical Therapy   Meds ordered this encounter  Medications   predniSONE (STERAPRED UNI-PAK 21 TAB) 10 MG (21) TBPK tablet    Sig: Take as directed    Dispense:  21 tablet    Refill:  0   metaxalone (SKELAXIN) 800 MG tablet    Sig: Take 1 tablet (800 mg total) by mouth 3 (three) times daily.    Dispense:  20 tablet    Refill:  0      Procedures: No procedures performed   Clinical Data: No additional findings.   Subjective: Chief Complaint  Patient presents with   Right Shoulder - Pain    Jaime Barnes is a 69 year old gentleman here for evaluation of chronic right and left shoulder pain with radiation down into the hand and numbness in the small finger.  Denies any injuries.  He states he woke up 1 morning with this pain has been worse in the last few weeks.  He has been taking tizanidine without   Review of Systems  Constitutional: Negative.   All other systems reviewed and are negative.    Objective: Vital Signs: There were no vitals taken for this visit.  Physical Exam Vitals and nursing note reviewed.  Constitutional:      Appearance: He is well-developed.  HENT:     Head: Normocephalic and atraumatic.  Eyes:     Pupils: Pupils are equal, round, and  reactive to light.  Pulmonary:     Effort: Pulmonary effort is normal.  Abdominal:     Palpations: Abdomen is soft.  Musculoskeletal:        General: Normal range of motion.     Cervical back: Neck supple.  Skin:    General: Skin is warm.  Neurological:     Mental Status: He is alert and oriented to person, place, and time.  Psychiatric:        Behavior: Behavior normal.        Thought Content: Thought content normal.        Judgment: Judgment normal.     Ortho Exam Shoulder exams are unremarkable.  No focal motor or sensory deficits of the upper extremities.  Cervical spine is tender to palpation.  Pain with cervical range of motion. Specialty Comments:  No specialty comments available.  Imaging: XR Cervical Spine 2 or 3 views  Result Date: 03/22/2020 Straightening of cervical spine with degenerative disc disease    PMFS History: Patient Active Problem List   Diagnosis Date Noted   Pain due to onychomycosis of toenails of both feet 03/30/2019   Hypertriglyceridemia 01/21/2019  Radiculopathy of lumbar region 08/19/2017   CKD (chronic kidney disease) stage 3, GFR 30-59 ml/min 03/03/2017   Exercise-induced bronchospasm 11/11/2016   Migraine without aura and without status migrainosus, not intractable 12/20/2015   Back pain 05/06/2015   Constipation 05/06/2015   Neuropathic pain 04/19/2015   Syncope and collapse 04/19/2015   Bilateral calf pain 04/19/2015   Dizziness 11/02/2014   Dystrophic nail 08/23/2013   Tibialis posterior tendinitis 05/03/2013   Anemia 08/24/2012   Plaque psoriasis 08/24/2012   Breast pain, left 11/27/2011   Ejaculatory disorder 04/22/2011   CAD (coronary artery disease) 03/28/2010   HERPES SIMPLEX INFECTION 06/29/2009   BARRETTS ESOPHAGUS 06/12/2008   TOBACCO USER 03/11/2007   Hyperlipidemia 11/26/2006   BIPOLAR DISORDER 11/26/2006   Migraine headache 11/26/2006   HYPERTENSION, BENIGN SYSTEMIC 11/26/2006    HEMORRHOIDS, NOS 11/26/2006   PEPTIC ULCER DIS., UNSPEC. W/O OBSTRUCTION 11/26/2006   DIVERTICULOSIS OF COLON 11/26/2006   CIRRHOSIS, ALCOHOLIC 20/35/5974   PANCREATITIS, CHRONIC 11/26/2006   IMPOTENCE, ORGANIC 11/26/2006   Insomnia 11/26/2006   Past Medical History:  Diagnosis Date   Barrett's esophagus 2011   Recommend repeat EGD for surveillance in 2 yrs-[facility]   Bipolar disorder ([place])    Chronic kidney disease    Cirrhosis ([place])    Diverticulosis of colon    GANGLION CYST, TENDON SHEATH 06/26/2010   Hyperlipidemia    Hypertension    Hypertriglyceridemia 01/21/2019   Impotence    Migraine    Pancreatitis    Pulmonary nodule    Pulmonary nodule seen on imaging study 02/06/2011   Followup CT in December 2014 with 20 months of stability at 4 mm.  No further imaging recommended     Family History  Problem Relation Age of Onset   Anemia Mother    Hypertension Daughter    Diabetes Daughter    Migraines Daughter     Past Surgical History:  Procedure Laterality Date   CARDIAC CATHETERIZATION  2005   Nonobstructive, <40%   TONSILLECTOMY Bilateral 1961   Social History   Occupational History   Not on file  Tobacco Use   Smoking status: Light Tobacco Smoker    Packs/day: 0.25    Years: 53.00    Pack years: 13.25    Types: Cigars    Start date: 09/29/1962    Last attempt to quit: 08/04/2016    Years since quitting: 3.6   Smokeless tobacco: Former Systems developer    Quit date: 03/08/2017   Tobacco comment: cigar occassionally   Vaping Use   Vaping Use: Never used  Substance and Sexual Activity   Alcohol use: No    Comment: Last drink "30 years ago"   Drug use: Yes    Types: Marijuana    Comment: daily marijuana since teenager.    Sexual activity: Yes    Birth control/protection: Condom    Comment: Multiple sex partners. 60 in last year.

## 2020-03-22 NOTE — Addendum Note (Signed)
Addended by: Azucena Cecil on: 03/22/2020 05:53 PM   Modules accepted: Orders

## 2020-03-22 NOTE — Telephone Encounter (Signed)
Is there another Rx instead?

## 2020-03-22 NOTE — Telephone Encounter (Signed)
Patient called.   His insurance will not cover the metaxalone that was prescribed to him. He is requesting we give the pharmacy a call  Call back: [phone]

## 2020-03-28 DIAGNOSIS — Z1211 Encounter for screening for malignant neoplasm of colon: Secondary | ICD-10-CM | POA: Diagnosis not present

## 2020-03-30 ENCOUNTER — Other Ambulatory Visit: Payer: Self-pay | Admitting: Orthopaedic Surgery

## 2020-04-03 ENCOUNTER — Ambulatory Visit: Payer: Medicare Other | Attending: Orthopaedic Surgery

## 2020-04-03 ENCOUNTER — Other Ambulatory Visit: Payer: Self-pay

## 2020-04-03 DIAGNOSIS — M542 Cervicalgia: Secondary | ICD-10-CM | POA: Diagnosis not present

## 2020-04-03 DIAGNOSIS — M436 Torticollis: Secondary | ICD-10-CM

## 2020-04-03 DIAGNOSIS — M5412 Radiculopathy, cervical region: Secondary | ICD-10-CM | POA: Diagnosis not present

## 2020-04-03 NOTE — Therapy (Signed)
[place], [place], [address] Phone: [phone]   Fax:  [phone]  Physical Therapy Evaluation  Patient Details  Name: Jaime Barnes MRN: [id number] Date of Birth: 07-29-1951 Referring Provider (PT): Frankey Shown , MD   Encounter Date: 04/03/2020   PT End of Session - 04/03/20 0955    Visit Number 1    Number of Visits 12    Date for PT Re-Evaluation 05/11/20    Authorization Type UHC MCR    PT Start Time 0955    PT Stop Time 1038    PT Time Calculation (min) 43 min    Activity Tolerance Patient tolerated treatment well    Behavior During Therapy [facility] for tasks assessed/performed           Past Medical History:  Diagnosis Date  . Barrett's esophagus 2011   Recommend repeat EGD for surveillance in 2 yrs-[facility]  . Bipolar disorder ([place])   . Chronic kidney disease   . Cirrhosis ([place])   . Diverticulosis of colon   . GANGLION CYST, TENDON SHEATH 06/26/2010  . Hyperlipidemia   . Hypertension   . Hypertriglyceridemia 01/21/2019  . Impotence   . Migraine   . Pancreatitis   . Pulmonary nodule   . Pulmonary nodule seen on imaging study 02/06/2011   Followup CT in December 2014 with 20 months of stability at 4 mm.  No further imaging recommended     Past Surgical History:  Procedure Laterality Date  . CARDIAC CATHETERIZATION  2005   Nonobstructive, <40%  . TONSILLECTOMY Bilateral 1961    There were no vitals filed for this visit.    Subjective Assessment - 04/03/20 1001    Subjective I have a pinched nerve with neck and shoulder pain.  Pain is bilateral.    RT is worse with pain to fingers of RT hand . Stays cold and numb.   The symptoms started 2 months ago.  Thought slept wrong.  He was issued meds from MD and this is beneficial  May have MRI if no benefit from PT. Also has some posterior head painat times    Limitations --   Can't play tennis , lift weight , pick up dog.   Diagnostic tests  xrays: OA    Patient Stated Goals He wants to get better with less pain and return to normal activity    Currently in Pain? Yes    Pain Score 7     Pain Location Neck    Pain Descriptors / Indicators Shooting;Sharp   pressure / tight   Pain Type Acute pain    Pain Radiating Towards RT arm    Pain Onset More than a month ago    Pain Frequency Constant    Aggravating Factors  reching over head /lifting /bending    Pain Relieving Factors meds,  hot water              [facility] PT Assessment - 04/03/20 0001      Assessment   Medical Diagnosis cervical radiculopathy    Referring Provider (PT) Frankey Shown , MD      Precautions   Precautions None      Restrictions   Weight Bearing Restrictions No      Prior Function   Level of Independence Independent      Cognition   Overall Cognitive Status Within Functional Limits for tasks assessed      Posture/Postural Control   Posture Comments decr  lordosis       ROM / Strength   AROM / PROM / Strength AROM;Strength      AROM   AROM Assessment Site Cervical    Cervical Flexion 60   pulling in neck   Cervical Extension 35   pulling in neck   Cervical - Right Side Bend 20    Cervical - Left Side Bend 30    Cervical - Right Rotation 30    Cervical - Left Rotation 35      Strength   Overall Strength Comments WNL except triceps and thumb extension 4+/5      Palpation   Palpation comment Tender bilaterally paraspinals .       Ambulation/Gait   Gait Comments WNL                      Objective measurements completed on examination: See above findings.               PT Education - 04/03/20 0955    Education Details POC,  initial FOTO score reviewed,  self care for relaxation with exercises and stretching    Person(s) Educated Patient    Methods Explanation    Comprehension Verbalized understanding            PT Short Term Goals - 04/03/20 0956      PT SHORT TERM GOAL #1   Title He will be  independent with initial HEP    Time 3    Period Weeks    Status New      PT SHORT TERM GOAL #2   Title He will report pain decreased 25% or more with general home tasks    Time 3    Period Weeks    Status New      PT SHORT TERM GOAL #3   Title He will improve FOTO score by 5-7 points    Time 3    Period Weeks    Status New             PT Long Term Goals - 04/03/20 1042      PT LONG TERM GOAL #1   Title Pt will decrease morning pain to 1-2/10 or less in the neck    Time 6    Period Weeks    Status New      PT LONG TERM GOAL #2   Title Pt will centralize radicular symptoms to noRT arm pain    Time 6    Period Weeks    Status New      PT LONG TERM GOAL #3   Title FOTO score will improve 15 points  to demo perceived functional improvement    Time 6    Period Weeks    Status New      PT LONG TERM GOAL #4   Title Pt will be able to return to below shoulder level tennis activity    Time 6    Period Weeks    Status New      PT LONG TERM GOAL #5   Title He will return to yard work with n1-2 max pain    Time 6    Period Weeks    Status New                  Plan - 04/03/20 0956    Clinical Impression Statement [name] presents with complaint of 2 months bilateral neck pain and pain to RT hand  with numbness.  No injury reported. He has tenderness bilateral neck to shoulders and decreased AROM in neck and RT shoulder. He is stretching at home and though doing correct stretches and ROM he may be overdoing this so cued to decr intensity and relax mor with the stretching  He should improve with skilled PT    Personal Factors and Comorbidities Age;Time since onset of injury/illness/exacerbation    Examination-Activity Limitations Reach Overhead;Bend;Lift    Examination-Participation Restrictions Cleaning;Yard Work;Community Activity    Stability/Clinical Decision Making Stable/Uncomplicated    Clinical Decision Making Low    Rehab Potential Good    PT Frequency  2x / week    PT Duration 6 weeks    PT Treatment/Interventions Cryotherapy;Moist Heat;Traction;Ultrasound;Therapeutic exercise;Manual techniques;Patient/family education;Taping;Passive range of motion;Dry needling    PT Next Visit Plan Manual and modalities as beneficial . Gentle ROM .    PT Home Exercise Plan relaxation with stretching    Consulted and Agree with Plan of Care Patient           Patient will benefit from skilled therapeutic intervention in order to improve the following deficits and impairments:  Pain, Increased muscle spasms, Decreased range of motion, Postural dysfunction, Impaired UE functional use  Visit Diagnosis: Radiculopathy, cervical region  Cervicalgia  Stiffness of neck     Problem List Patient Active Problem List   Diagnosis Date Noted  . Pain due to onychomycosis of toenails of both feet 03/30/2019  . Hypertriglyceridemia 01/21/2019  . Radiculopathy of lumbar region 08/19/2017  . CKD (chronic kidney disease) stage 3, GFR 30-59 ml/min 03/03/2017  . Exercise-induced bronchospasm 11/11/2016  . Migraine without aura and without status migrainosus, not intractable 12/20/2015  . Back pain 05/06/2015  . Constipation 05/06/2015  . Neuropathic pain 04/19/2015  . Syncope and collapse 04/19/2015  . Bilateral calf pain 04/19/2015  . Dizziness 11/02/2014  . Dystrophic nail 08/23/2013  . Tibialis posterior tendinitis 05/03/2013  . Anemia 08/24/2012  . Plaque psoriasis 08/24/2012  . Breast pain, left 11/27/2011  . Ejaculatory disorder 04/22/2011  . CAD (coronary artery disease) 03/28/2010  . HERPES SIMPLEX INFECTION 06/29/2009  . BARRETTS ESOPHAGUS 06/12/2008  . TOBACCO USER 03/11/2007  . Hyperlipidemia 11/26/2006  . BIPOLAR DISORDER 11/26/2006  . Migraine headache 11/26/2006  . HYPERTENSION, BENIGN SYSTEMIC 11/26/2006  . HEMORRHOIDS, NOS 11/26/2006  . PEPTIC ULCER DIS., UNSPEC. W/O OBSTRUCTION 11/26/2006  . DIVERTICULOSIS OF COLON 11/26/2006  .  CIRRHOSIS, ALCOHOLIC 18/56/3149  . PANCREATITIS, CHRONIC 11/26/2006  . IMPOTENCE, ORGANIC 11/26/2006  . Insomnia 11/26/2006    Darrel Hoover  PT 04/03/2020, 10:51 AM  [facility] [address] [place], [place], [address] Phone: [phone]   Fax:  [phone]  Name: Jaime Barnes MRN: [id number] Date of Birth: 1951/06/25

## 2020-04-04 LAB — COLOGUARD: Cologuard: NEGATIVE

## 2020-04-07 ENCOUNTER — Other Ambulatory Visit: Payer: Self-pay | Admitting: Family Medicine

## 2020-04-07 ENCOUNTER — Other Ambulatory Visit: Payer: Self-pay | Admitting: Cardiovascular Disease

## 2020-04-07 ENCOUNTER — Other Ambulatory Visit: Payer: Self-pay | Admitting: Neurology

## 2020-04-07 NOTE — Telephone Encounter (Signed)
Requested Prescriptions  Pending Prescriptions Disp Refills  . albuterol (VENTOLIN HFA) 108 (90 Base) MCG/ACT inhaler [Pharmacy Med Name: ALBUTEROL HFA INH (200 PUFFS)8.5GM] 8.5 g 1    Sig: INHALE 2 PUFFS INTO THE LUNGS EVERY 6 HOURS AS NEEDED FOR WHEEZING OR SHORTNESS OF BREATH     Pulmonology:  Beta Agonists Failed - 04/07/2020  1:36 PM      Failed - One inhaler should last at least one month. If the patient is requesting refills earlier, contact the patient to check for uncontrolled symptoms.      Passed - Valid encounter within last 12 months    Recent Outpatient Visits          3 weeks ago Vesicular rash   [facility], Lilia Argue, MD   1 month ago Medicare annual wellness visit, subsequent   [facility], Lilia Argue, MD   6 months ago Psychophysiological insomnia   [facility], Lilia Argue, MD   1 year ago Hyperlipidemia, unspecified hyperlipidemia type   [facility], Lilia Argue, MD   1 year ago Hyperlipidemia, unspecified hyperlipidemia type   [facility], Lilia Argue, MD      Future Appointments            In 2 months Rutherford Guys, MD Primary Care at [place], [facility]

## 2020-04-09 ENCOUNTER — Other Ambulatory Visit: Payer: Self-pay

## 2020-04-09 ENCOUNTER — Ambulatory Visit: Payer: Medicare Other

## 2020-04-09 DIAGNOSIS — M5412 Radiculopathy, cervical region: Secondary | ICD-10-CM | POA: Diagnosis not present

## 2020-04-09 DIAGNOSIS — M542 Cervicalgia: Secondary | ICD-10-CM

## 2020-04-09 DIAGNOSIS — M436 Torticollis: Secondary | ICD-10-CM

## 2020-04-09 NOTE — Patient Instructions (Signed)
Shoulder IR /ER and hor abduction /adsduction x 5-10 resl  2x/day Trunk rotation and side bending 5-10 reps RT/LT  2x/day Arms overhead x 3-5 reps  2x/day

## 2020-04-09 NOTE — Therapy (Signed)
[place], [place], [address] Phone: [phone]   Fax:  [phone]  Physical Therapy Treatment  Patient Details  Name: Jaime Barnes MRN: [id number] Date of Birth: 08-01-1951 Referring Provider (PT): Frankey Shown , MD   Encounter Date: 04/09/2020   PT End of Session - 04/09/20 1357    Visit Number 2    Number of Visits 12    Date for PT Re-Evaluation 05/11/20    Authorization Type UHC MCR    PT Start Time 0200    PT Stop Time 0250    PT Time Calculation (min) 50 min    Activity Tolerance Patient tolerated treatment well    Behavior During Therapy [facility] for tasks assessed/performed           Past Medical History:  Diagnosis Date  . Barrett's esophagus 2011   Recommend repeat EGD for surveillance in 2 yrs-[facility]  . Bipolar disorder ([place])   . Chronic kidney disease   . Cirrhosis ([place])   . Diverticulosis of colon   . GANGLION CYST, TENDON SHEATH 06/26/2010  . Hyperlipidemia   . Hypertension   . Hypertriglyceridemia 01/21/2019  . Impotence   . Migraine   . Pancreatitis   . Pulmonary nodule   . Pulmonary nodule seen on imaging study 02/06/2011   Followup CT in December 2014 with 20 months of stability at 4 mm.  No further imaging recommended     Past Surgical History:  Procedure Laterality Date  . CARDIAC CATHETERIZATION  2005   Nonobstructive, <40%  . TONSILLECTOMY Bilateral 1961    There were no vitals filed for this visit.   Subjective Assessment - 04/09/20 1359    Subjective He reports doing better Doing HEP Feels looser now    Pain Score 3     Pain Location Neck    Pain Descriptors / Indicators Sharp;Shooting    Pain Type Acute pain    Pain Onset More than a month ago              [facility] PT Assessment - 04/09/20 0001      AROM   Cervical - Right Rotation 50    Cervical - Left Rotation 45                         [facility] Adult PT Treatment/Exercise - 04/09/20  0001      Exercises   Exercises Neck      Neck Exercises: Seated   Other Seated Exercise stretching sidebend and rotation. Also trunk rotation , side bend, with arms overhead and not overhead.   Shoulder IR/ER, shoulder  hor adduct and abduct with elbows 90 degrees .       Modalities   Modalities Moist Heat;Ultrasound      Moist Heat Therapy   Number Minutes Moist Heat 10 Minutes    Moist Heat Location Cervical      Ultrasound   Ultrasound Location neck bilaterlaly     Ultrasound Parameters 100% , 1.6 wcm2, 1 MHz      Manual Therapy   Manual Therapy Soft tissue mobilization;Joint mobilization;Manual Traction;Passive ROM    Joint Mobilization Gr 2-3- glide to neck    Soft tissue mobilization neck and shoulders    Passive ROM side bend and rotation                  PT Education - 04/09/20 1439    Education Details  HEP    Person(s) Educated Patient    Methods Explanation;Demonstration;Verbal cues;Handout    Comprehension Verbalized understanding;Returned demonstration            PT Short Term Goals - 04/03/20 0956      PT SHORT TERM GOAL #1   Title He will be independent with initial HEP    Time 3    Period Weeks    Status New      PT SHORT TERM GOAL #2   Title He will report pain decreased 25% or more with general home tasks    Time 3    Period Weeks    Status New      PT SHORT TERM GOAL #3   Title He will improve FOTO score by 5-7 points    Time 3    Period Weeks    Status New             PT Long Term Goals - 04/03/20 1042      PT LONG TERM GOAL #1   Title Pt will decrease morning pain to 1-2/10 or less in the neck    Time 6    Period Weeks    Status New      PT LONG TERM GOAL #2   Title Pt will centralize radicular symptoms to noRT arm pain    Time 6    Period Weeks    Status New      PT LONG TERM GOAL #3   Title FOTO score will improve 15 points  to demo perceived functional improvement    Time 6    Period Weeks    Status New       PT LONG TERM GOAL #4   Title Pt will be able to return to below shoulder level tennis activity    Time 6    Period Weeks    Status New      PT LONG TERM GOAL #5   Title He will return to yard work with n1-2 max pain    Time 6    Period Weeks    Status New                 Plan - 04/09/20 1358    Clinical Impression Statement Improved with increased ROM and able to reach overhead 90% compared to LT. Probable need for spine mobility so issued exercise for this.   Will continue with range and soft tissue manual    PT Treatment/Interventions Cryotherapy;Moist Heat;Traction;Ultrasound;Therapeutic exercise;Manual techniques;Patient/family education;Taping;Passive range of motion;Dry needling    PT Next Visit Plan Manual and modalities as beneficial . Gentle ROM .    PT Home Exercise Plan relaxation with stretching,  trunk rotation and sidebending, eaching overhead, shoulder ER/IR  and hor adduct and abduction    Consulted and Agree with Plan of Care Patient           Patient will benefit from skilled therapeutic intervention in order to improve the following deficits and impairments:  Pain, Increased muscle spasms, Decreased range of motion, Postural dysfunction, Impaired UE functional use  Visit Diagnosis: Radiculopathy, cervical region  Cervicalgia  Stiffness of neck     Problem List Patient Active Problem List   Diagnosis Date Noted  . Pain due to onychomycosis of toenails of both feet 03/30/2019  . Hypertriglyceridemia 01/21/2019  . Radiculopathy of lumbar region 08/19/2017  . CKD (chronic kidney disease) stage 3, GFR 30-59 ml/min 03/03/2017  . Exercise-induced bronchospasm 11/11/2016  . Migraine  without aura and without status migrainosus, not intractable 12/20/2015  . Back pain 05/06/2015  . Constipation 05/06/2015  . Neuropathic pain 04/19/2015  . Syncope and collapse 04/19/2015  . Bilateral calf pain 04/19/2015  . Dizziness 11/02/2014  . Dystrophic  nail 08/23/2013  . Tibialis posterior tendinitis 05/03/2013  . Anemia 08/24/2012  . Plaque psoriasis 08/24/2012  . Breast pain, left 11/27/2011  . Ejaculatory disorder 04/22/2011  . CAD (coronary artery disease) 03/28/2010  . HERPES SIMPLEX INFECTION 06/29/2009  . BARRETTS ESOPHAGUS 06/12/2008  . TOBACCO USER 03/11/2007  . Hyperlipidemia 11/26/2006  . BIPOLAR DISORDER 11/26/2006  . Migraine headache 11/26/2006  . HYPERTENSION, BENIGN SYSTEMIC 11/26/2006  . HEMORRHOIDS, NOS 11/26/2006  . PEPTIC ULCER DIS., UNSPEC. W/O OBSTRUCTION 11/26/2006  . DIVERTICULOSIS OF COLON 11/26/2006  . CIRRHOSIS, ALCOHOLIC 86/77/3736  . PANCREATITIS, CHRONIC 11/26/2006  . IMPOTENCE, ORGANIC 11/26/2006  . Insomnia 11/26/2006    Darrel Hoover  PT 04/09/2020, 2:43 PM  [place] [facility] [address] [place], [place], [address] Phone: [phone]   Fax:  [phone]  Name: Dak Szumski MRN: [id number] Date of Birth: [birth year]

## 2020-04-10 ENCOUNTER — Other Ambulatory Visit: Payer: Self-pay | Admitting: Family Medicine

## 2020-04-10 NOTE — Telephone Encounter (Signed)
Requested medication (s) are due for refill today: no  Requested medication (s) are on the active medication list:  yes Last refill:  12/21/2019  Future visit scheduled: no  Notes to clinic:  this refill cannot be delegated    Requested Prescriptions  Pending Prescriptions Disp Refills   tiZANidine (ZANAFLEX) 4 MG tablet [Pharmacy Med Name: TIZANIDINE 4MG  TABLETS] 30 tablet 2    Sig: TAKE 1 TABLET(4 MG) BY MOUTH AT BEDTIME AS NEEDED FOR MUSCLE SPASMS      Not Delegated - Cardiovascular:  Alpha-2 Agonists - tizanidine Failed - 04/10/2020 12:06 PM      Failed - This refill cannot be delegated      Passed - Valid encounter within last 6 months    Recent Outpatient Visits           4 weeks ago Vesicular rash   [facility], Lilia Argue, MD   1 month ago Medicare annual wellness visit, subsequent   [facility], Lilia Argue, MD   6 months ago Psychophysiological insomnia   [facility], Lilia Argue, MD   1 year ago Hyperlipidemia, unspecified hyperlipidemia type   [facility], Lilia Argue, MD   1 year ago Hyperlipidemia, unspecified hyperlipidemia type   [facility], Lilia Argue, MD       Future Appointments             In 2 months Rutherford Guys, MD Primary Care at [place], [facility]

## 2020-04-12 ENCOUNTER — Ambulatory Visit: Payer: Medicare Other | Admitting: Physical Therapy

## 2020-04-12 ENCOUNTER — Encounter: Payer: Self-pay | Admitting: Physical Therapy

## 2020-04-12 ENCOUNTER — Other Ambulatory Visit: Payer: Self-pay

## 2020-04-12 DIAGNOSIS — M542 Cervicalgia: Secondary | ICD-10-CM | POA: Diagnosis not present

## 2020-04-12 DIAGNOSIS — M5412 Radiculopathy, cervical region: Secondary | ICD-10-CM | POA: Diagnosis not present

## 2020-04-12 DIAGNOSIS — M436 Torticollis: Secondary | ICD-10-CM | POA: Diagnosis not present

## 2020-04-12 NOTE — Therapy (Addendum)
[place], [place], [address] Phone: [phone]   Fax:  [phone]  Physical Therapy Treatment/Discharge  Patient Details  Name: Jaime Barnes MRN: [id number] Date of Birth: [birth year] Referring Provider (PT): Frankey Shown , MD   Encounter Date: 04/12/2020   PT End of Session - 04/12/20 1151    Visit Number 3    Number of Visits 12    Date for PT Re-Evaluation 05/11/20    Authorization Type UHC MCR    PT Start Time 5027    PT Stop Time 1227    PT Time Calculation (min) 38 min           Past Medical History:  Diagnosis Date   Barrett's esophagus 2011   Recommend repeat EGD for surveillance in 2 yrs-[facility]   Bipolar disorder ([place])    Chronic kidney disease    Cirrhosis ([place])    Diverticulosis of colon    GANGLION CYST, TENDON SHEATH 06/26/2010   Hyperlipidemia    Hypertension    Hypertriglyceridemia 01/21/2019   Impotence    Migraine    Pancreatitis    Pulmonary nodule    Pulmonary nodule seen on imaging study 02/06/2011   Followup CT in December 2014 with 20 months of stability at 4 mm.  No further imaging recommended     Past Surgical History:  Procedure Laterality Date   CARDIAC CATHETERIZATION  2005   Nonobstructive, <40%   TONSILLECTOMY Bilateral 1961    There were no vitals filed for this visit.   Subjective Assessment - 04/12/20 1150    Subjective Turning the head to the right is still painful.    Currently in Pain? Yes    Pain Score --   did not rate   Pain Location Neck    Pain Orientation Right    Pain Descriptors / Indicators Sharp    Pain Type Acute pain    Aggravating Factors  right cervical rotation    Pain Relieving Factors hot water                             [facility] Adult PT Treatment/Exercise - 04/12/20 0001      Neck Exercises: Seated   Other Seated Exercise chin tuck and scap  retract     Other Seated Exercise Seated red  band horizontal abduction , ER x 20 each , diagonals x 10 each       Neck Exercises: Supine   Other Supine Exercise red band horizontals and diagona pulls     Other Supine Exercise chin tuck       Manual Therapy   Soft tissue mobilization right neck     Passive ROM side bend and rotation      Neck Exercises: Stretches   Levator Stretch 10 seconds;3 reps    Other Neck Stretches AROM Neck side bend and rotaitions                    PT Short Term Goals - 04/03/20 0956      PT SHORT TERM GOAL #1   Title He will be independent with initial HEP    Time 3    Period Weeks    Status New      PT SHORT TERM GOAL #2   Title He will report pain decreased 25% or more with general home tasks    Time 3    Period  Weeks    Status New      PT SHORT TERM GOAL #3   Title He will improve FOTO score by 5-7 points    Time 3    Period Weeks    Status New             PT Long Term Goals - 04/03/20 1042      PT LONG TERM GOAL #1   Title Pt will decrease morning pain to 1-2/10 or less in the neck    Time 6    Period Weeks    Status Met     PT LONG TERM GOAL #2   Title Pt will centralize radicular symptoms to noRT arm pain    Time 6    Period Weeks    Status Met     PT LONG TERM GOAL #3   Title FOTO score will improve 15 points  to demo perceived functional improvement    Time 6    Period Weeks    Status Did not complete     PT LONG TERM GOAL #4   Title Pt will be able to return to below shoulder level tennis activity    Time 6    Period Weeks    Status New      PT LONG TERM GOAL #5   Title He will return to yard work with n1-2 max pain    Time 6    Period Weeks    Status Met doing yard work and Hydrologist                Plan - 04/12/20 1234    Clinical Impression Statement Pt reports improvement but with difficulty during right neck rotation. Worked on neck stab and stretches. He was better after passive ROM. Able to turn head at end of session with  much less pain and improved ROM. Pt given red band for scap stab.    PT Next Visit Plan Manual and modalities as beneficial . Gentle ROM .    PT Home Exercise Plan relaxation with stretching,  trunk rotation and sidebending, eaching overhead, shoulder ER/IR  and hor adduct and abduction: red band horizontal abduction andER           Patient will benefit from skilled therapeutic intervention in order to improve the following deficits and impairments:  Pain, Increased muscle spasms, Decreased range of motion, Postural dysfunction, Impaired UE functional use  Visit Diagnosis: Radiculopathy, cervical region  Cervicalgia  Stiffness of neck     Problem List Patient Active Problem List   Diagnosis Date Noted   Pain due to onychomycosis of toenails of both feet 03/30/2019   Hypertriglyceridemia 01/21/2019   Radiculopathy of lumbar region 08/19/2017   CKD (chronic kidney disease) stage 3, GFR 30-59 ml/min 03/03/2017   Exercise-induced bronchospasm 11/11/2016   Migraine without aura and without status migrainosus, not intractable 12/20/2015   Back pain 05/06/2015   Constipation 05/06/2015   Neuropathic pain 04/19/2015   Syncope and collapse 04/19/2015   Bilateral calf pain 04/19/2015   Dizziness 11/02/2014   Dystrophic nail 08/23/2013   Tibialis posterior tendinitis 05/03/2013   Anemia 08/24/2012   Plaque psoriasis 08/24/2012   Breast pain, left 11/27/2011   Ejaculatory disorder 04/22/2011   CAD (coronary artery disease) 03/28/2010   HERPES SIMPLEX INFECTION 06/29/2009   BARRETTS ESOPHAGUS 06/12/2008   TOBACCO USER 03/11/2007   Hyperlipidemia 11/26/2006   BIPOLAR DISORDER 11/26/2006   Migraine headache 11/26/2006   HYPERTENSION, BENIGN SYSTEMIC  11/26/2006   HEMORRHOIDS, NOS 11/26/2006   PEPTIC ULCER DIS., UNSPEC. W/O OBSTRUCTION 11/26/2006   DIVERTICULOSIS OF COLON 11/26/2006   CIRRHOSIS, ALCOHOLIC 89/78/4784   PANCREATITIS, CHRONIC 11/26/2006    IMPOTENCE, ORGANIC 11/26/2006   Insomnia 11/26/2006    Dorene Ar, PTA 04/12/2020, 12:40 PM  [facility] [address] [place], [place], [address] Phone: [phone]   Fax:  [phone]  Name: Axil Copeman MRN: [id number] Date of Birth: 23-Dec-1950  PHYSICAL THERAPY DISCHARGE SUMMARY  Visits from Start of Care: 3  Current functional level related to goals / functional outcomes: Mr Ledee arrived today reporting he felt fine and was playing tennis no without pain.    Remaining deficits: None pre pt.   Education / Equipment: HEP  Plan: Patient agrees to discharge.  Patient goals were met. Patient is being discharged due to being pleased with the current functional level.  ?????    Pearson Forster  PT   05/03/20

## 2020-04-16 ENCOUNTER — Ambulatory Visit: Payer: Medicare Other

## 2020-04-18 ENCOUNTER — Telehealth: Payer: Self-pay | Admitting: Physical Therapy

## 2020-04-18 ENCOUNTER — Ambulatory Visit: Payer: Medicare Other | Admitting: Physical Therapy

## 2020-04-18 NOTE — Telephone Encounter (Signed)
Spoke to Jaime Barnes regarding no show to appointment. He reports new medication has made him sleepy and he forgot about his appointment. Confirmed next appointment and he does plan to attend.

## 2020-04-19 ENCOUNTER — Other Ambulatory Visit: Payer: Self-pay | Admitting: Gastroenterology

## 2020-04-19 DIAGNOSIS — K7030 Alcoholic cirrhosis of liver without ascites: Secondary | ICD-10-CM

## 2020-04-20 ENCOUNTER — Other Ambulatory Visit: Payer: Self-pay | Admitting: Registered Nurse

## 2020-04-20 ENCOUNTER — Telehealth: Payer: Self-pay | Admitting: Family Medicine

## 2020-04-20 ENCOUNTER — Other Ambulatory Visit: Payer: Self-pay | Admitting: Orthopaedic Surgery

## 2020-04-20 DIAGNOSIS — F5104 Psychophysiologic insomnia: Secondary | ICD-10-CM

## 2020-04-20 NOTE — Telephone Encounter (Signed)
Ok to rf? 

## 2020-04-20 NOTE — Telephone Encounter (Signed)
Patient is requesting a refill of the following medications: Requested Prescriptions   Pending Prescriptions Disp Refills   traZODone (DESYREL) 100 MG tablet [Pharmacy Med Name: TRAZODONE 100MG  TABLETS] 30 tablet 0    Sig: TAKE 1 TABLET(100 MG) BY MOUTH AT BEDTIME    Date of patient request: 04/20/2020 Last office visit: 03/12/2020 Date of last refill: 03/19/2020 Last refill amount: 30 tablets Follow up time period per chart: 06/11/2020

## 2020-04-20 NOTE — Telephone Encounter (Signed)
Medication: topiramate (TOPAMAX) 100 MG tablet [[id number]]  Last appt 03/12/20  Has the patient contacted their pharmacy? Yes  (Agent: If no, request that the patient contact the pharmacy for the refill.) (Agent: If yes, when and what did the pharmacy advise?)  Preferred Pharmacy (with phone number or street name): [facility] [place], [place] - [address] AT [address]  Phone:  [phone] Fax:  [phone]     Agent: Please be advised that RX refills may take up to 3 business days. We ask that you follow-up with your pharmacy.

## 2020-04-23 ENCOUNTER — Other Ambulatory Visit: Payer: Self-pay | Admitting: Emergency Medicine

## 2020-04-23 ENCOUNTER — Ambulatory Visit: Payer: Medicare Other

## 2020-04-23 DIAGNOSIS — G43009 Migraine without aura, not intractable, without status migrainosus: Secondary | ICD-10-CM

## 2020-04-23 MED ORDER — TOPIRAMATE 100 MG PO TABS: ORAL_TABLET | ORAL | 2 refills | Status: DC

## 2020-04-23 NOTE — Telephone Encounter (Signed)
Rx has been sent to the pharmacy

## 2020-04-25 ENCOUNTER — Ambulatory Visit: Payer: Medicare Other | Admitting: Physical Therapy

## 2020-04-25 ENCOUNTER — Telehealth: Payer: Self-pay | Admitting: Physical Therapy

## 2020-04-25 NOTE — Telephone Encounter (Signed)
Contacted patient in regard to second no show. He reports that he twisted his ankle yesterday and forgot to call to cancel his appointment today. He plans to attend his appointments next week.

## 2020-04-27 ENCOUNTER — Ambulatory Visit
Admission: RE | Admit: 2020-04-27 | Discharge: 2020-04-27 | Disposition: A | Discharge status: Home / Self Care | Payer: Medicare Other | Source: Ambulatory Visit | Attending: Gastroenterology | Admitting: Gastroenterology

## 2020-04-27 DIAGNOSIS — K7030 Alcoholic cirrhosis of liver without ascites: Secondary | ICD-10-CM

## 2020-04-27 DIAGNOSIS — N281 Cyst of kidney, acquired: Secondary | ICD-10-CM | POA: Diagnosis not present

## 2020-04-27 DIAGNOSIS — Q600 Renal agenesis, unilateral: Secondary | ICD-10-CM | POA: Diagnosis not present

## 2020-04-30 ENCOUNTER — Ambulatory Visit: Payer: Medicare Other | Attending: Orthopaedic Surgery

## 2020-04-30 ENCOUNTER — Telehealth: Payer: Self-pay | Admitting: Physical Therapy

## 2020-04-30 NOTE — Telephone Encounter (Signed)
Message left for Jaime Barnes about missed appointment today and next appointment 05/03/20 at 1130. Asked him to call if he was not coming to his next appointment.

## 2020-05-03 ENCOUNTER — Ambulatory Visit: Payer: Medicare Other

## 2020-05-04 ENCOUNTER — Ambulatory Visit: Payer: Medicare Other | Admitting: Podiatry

## 2020-05-14 ENCOUNTER — Other Ambulatory Visit: Payer: Self-pay | Admitting: Registered Nurse

## 2020-05-14 ENCOUNTER — Other Ambulatory Visit: Payer: Self-pay | Admitting: Family Medicine

## 2020-05-14 DIAGNOSIS — F5104 Psychophysiologic insomnia: Secondary | ICD-10-CM

## 2020-05-14 NOTE — Telephone Encounter (Signed)
   Notes to clinic:  Patient requests 90 days supply   Requested Prescriptions  Pending Prescriptions Disp Refills   albuterol (VENTOLIN HFA) 108 (90 Base) MCG/ACT inhaler [Pharmacy Med Name: ALBUTEROL HFA INH (200 PUFFS)8.5GM] 25.5 g     Sig: INHALE 2 PUFFS INTO THE LUNGS EVERY 6 HOURS AS NEEDED FOR WHEEZING OR SHORTNESS OF BREATH      Pulmonology:  Beta Agonists Failed - 05/14/2020 10:54 AM      Failed - One inhaler should last at least one month. If the patient is requesting refills earlier, contact the patient to check for uncontrolled symptoms.      Passed - Valid encounter within last 12 months    Recent Outpatient Visits           2 months ago Vesicular rash   [facility] Lilia Argue, MD   2 months ago Medicare annual wellness visit, subsequent   [facility] Lilia Argue, MD   7 months ago Psychophysiological insomnia   [facility], Lilia Argue, MD   1 year ago Hyperlipidemia, unspecified hyperlipidemia type   [facility], Lilia Argue, MD   1 year ago Hyperlipidemia, unspecified hyperlipidemia type   [facility], Lilia Argue, MD       Future Appointments             In 4 weeks Rutherford Guys, MD Primary Care at [place], [facility]

## 2020-05-14 NOTE — Telephone Encounter (Signed)
Patient is requesting a refill of the following medications: Requested Prescriptions   Pending Prescriptions Disp Refills   traZODone (DESYREL) 100 MG tablet [Pharmacy Med Name: TRAZODONE 100MG  TABLETS] 30 tablet 0    Sig: TAKE 1 TABLET(100 MG) BY MOUTH AT BEDTIME    Date of patient request: 05/14/2020 Last office visit: 03/12/2020 Date of last refill: 04/22/2020 Last refill amount: 30

## 2020-05-15 ENCOUNTER — Encounter: Payer: Self-pay | Admitting: Neurology

## 2020-05-15 NOTE — Progress Notes (Signed)
Romeo Zielinski (Key: [facility]) Rx #: [id number] [name] 100MG  tablets   Form [employer] Medicare Part D Electronic Prior Authorization Form (2017 NCPDP) Created 15 hours ago Sent to Plan less than a minute ago Plan Response less than a minute ago Submit Clinical Questions Determination N/A Message from Plan This medication or product was previously approved on [id number] from 2019-09-30 to 2020-09-28. **Please note: Formulary lowering, tiering exception, cost reduction and/or pre-benefit determination review (including prospective Medicare hospice reviews) requests cannot be requested using this method of submission. Please contact us at [phone] instead.

## 2020-05-16 ENCOUNTER — Encounter: Payer: Self-pay | Admitting: Podiatry

## 2020-05-16 ENCOUNTER — Other Ambulatory Visit: Payer: Self-pay

## 2020-05-16 ENCOUNTER — Ambulatory Visit (INDEPENDENT_AMBULATORY_CARE_PROVIDER_SITE_OTHER): Payer: Medicare Other | Admitting: Podiatry

## 2020-05-16 DIAGNOSIS — B351 Tinea unguium: Secondary | ICD-10-CM

## 2020-05-16 DIAGNOSIS — N183 Chronic kidney disease, stage 3 unspecified: Secondary | ICD-10-CM

## 2020-05-16 DIAGNOSIS — M79675 Pain in left toe(s): Secondary | ICD-10-CM | POA: Diagnosis not present

## 2020-05-16 DIAGNOSIS — M79674 Pain in right toe(s): Secondary | ICD-10-CM

## 2020-05-16 NOTE — Progress Notes (Signed)
This patient returns to my office for at risk foot care.  This patient requires this care by a professional since this patient will be at risk due to having chronic kidney disease  This patient is unable to cut nails himself since the patient cannot reach his nails.These nails are painful walking and wearing shoes.  This patient presents for at risk foot care today.  General Appearance  Alert, conversant and in no acute stress.  Vascular  Dorsalis pedis and posterior tibial  pulses are palpable  bilaterally.  Capillary return is within normal limits  bilaterally. Temperature is within normal limits  bilaterally.  Neurologic  Senn-Weinstein monofilament wire test within normal limits  bilaterally. Muscle power within normal limits bilaterally.  Nails Thick disfigured discolored nails with subungual debris  from hallux to fifth toes bilaterally. No evidence of bacterial infection or drainage bilaterally.  Orthopedic  No limitations of motion  feet .  No crepitus or effusions noted.  No bony pathology or digital deformities noted.  Skin  normotropic skin with no porokeratosis noted bilaterally.  No signs of infections or ulcers noted.     Onychomycosis  Pain in right toes  Pain in left toes  Consent was obtained for treatment procedures.   Mechanical debridement of nails 1-5  bilaterally performed with a nail nipper.  Filed with dremel without incident.    Return office visit   10 weeks                  Told patient to return for periodic foot care and evaluation due to potential at risk complications.   Novah Nessel DPM  

## 2020-05-23 DIAGNOSIS — K219 Gastro-esophageal reflux disease without esophagitis: Secondary | ICD-10-CM | POA: Diagnosis not present

## 2020-05-30 ENCOUNTER — Ambulatory Visit: Payer: Medicare Other | Admitting: Neurology

## 2020-05-31 NOTE — Progress Notes (Deleted)
NEUROLOGY FOLLOW UP OFFICE NOTE  Jaime Barnes [id number]  HISTORY OF PRESENT ILLNESS: Jaime Barnes a 69 year old right-handed man with hypertension, bipolar disorder, CKD, tobacco use, Barrett's esophagus and cirrhosis who follows up for migraines.  UPDATE: Intensity:  10/10 Duration:  Within 30 minutes with Ubrelvy Frequency:  3 headaches over past 6 months. Current NSAIDS: none Current analgesics: none Current triptans: none Current anti-emetic: none Current muscle relaxants: tizanidine Current Antihypertensive medications: Lisinopril 10mg , verapamil 120mg  three times daily Current Antidepressant medications: none Current Anticonvulsant medications: topiramate 100mg  twice daily, gabapentin 300mg  twice daily Current anti-CGRP:  Ubrelvy 100mg   HISTORY: Onset:  Has remote history of migraines but they returned in2016after experiencing increased stress related to his ex-girlfriend. Location:Right sided Quality:pounding Initial Intensity:"50"/10 Aura:no Prodrome:no Associated symptoms: Photophobia, phonophobia.No nausea or visual disturbance Initial Duration:Until takes medication Initial Frequency:every other day Triggers:  Emotional stress Relieving factors: None Activity:Cannot function  Past abortive medication:ibuprofen, naproxen, Tylenol, Excedrin, sumatriptan (effective but contraindicated due to cardiac history) Past preventative medication:none Other past therapy:none  CT of head from 07/19/15 was unremarkable. MRI of brain from 12/04/14 showed global atrophy and mild small vessel ischemic changes. 06/23/16: Sed rate 25  PAST MEDICAL HISTORY: Past Medical History:  Diagnosis Date  . Barrett's esophagus 2011   Recommend repeat EGD for surveillance in 2 yrs-[facility]  . Bipolar disorder ([place])   . Chronic kidney disease   . Cirrhosis ([place])   . Diverticulosis of colon   . GANGLION CYST, TENDON SHEATH 06/26/2010  .  Hyperlipidemia   . Hypertension   . Hypertriglyceridemia 01/21/2019  . Impotence   . Migraine   . Pancreatitis   . Pulmonary nodule   . Pulmonary nodule seen on imaging study 02/06/2011   Followup CT in December 2014 with 20 months of stability at 4 mm.  No further imaging recommended     MEDICATIONS: Current Outpatient Medications on File Prior to Visit  Medication Sig Dispense Refill  . albuterol (VENTOLIN HFA) 108 (90 Base) MCG/ACT inhaler INHALE 2 PUFFS INTO THE LUNGS EVERY 6 HOURS AS NEEDED FOR WHEEZING OR SHORTNESS OF BREATH 25.5 g 2  . amLODipine (NORVASC) 10 MG tablet TAKE 1 TABLET(10 MG) BY MOUTH DAILY 90 tablet 0  . aspirin 81 MG chewable tablet Chew 81 mg by mouth daily.    [name] atorvastatin (LIPITOR) 80 MG tablet Take 1 tablet (80 mg total) by mouth daily at 6 PM. TAKE 1 TABLET(40 MG) BY MOUTH DAILY 90 tablet 3  . gabapentin (NEURONTIN) 300 MG capsule TAKE 2 CAPSULES(600 MG) BY MOUTH THREE TIMES DAILY 180 capsule 5  . HYDROcodone-acetaminophen (NORCO/VICODIN) 5-325 MG tablet TK 1 T PO QID    . ibuprofen (ADVIL) 800 MG tablet     . MAGNESIUM-OXIDE 400 (241.3 Mg) MG tablet TAKE 1 TABLET BY MOUTH EVERY DAY 30 tablet 8  . meclizine (ANTIVERT) 12.5 MG tablet TAKE 1 TABLET BY MOUTH THREE TIMES DAILY AS NEEDED FOR DIZZINESS 30 tablet 0  . metaxalone (SKELAXIN) 800 MG tablet Take 1 tablet (800 mg total) by mouth 3 (three) times daily. 20 tablet 0  . methocarbamol (ROBAXIN) 750 MG tablet Take 1 tablet (750 mg total) by mouth 2 (two) times daily as needed for muscle spasms. 20 tablet 3  . metoprolol succinate (TOPROL-XL) 25 MG 24 hr tablet Take 1 tablet (25 mg total) by mouth daily. 90 tablet 1  . omeprazole (PRILOSEC) 20 MG capsule TAKE 1 CAPSULE(20 MG) BY MOUTH TWICE DAILY 180 capsule 0  .  predniSONE (STERAPRED UNI-PAK 21 TAB) 10 MG (21) TBPK tablet FOLLOW PACKAGE DIRECTIONS 21 each 0  . sildenafil (VIAGRA) 100 MG tablet TAKE 1/2 (ONE-HALF) TABLET BY MOUTH ONCE DAILY AS NEEDED FOR   ERECTILE  DYSFUNCTION 30 tablet 2  . tiZANidine (ZANAFLEX) 4 MG tablet TAKE 1 TABLET(4 MG) BY MOUTH AT BEDTIME AS NEEDED FOR MUSCLE SPASMS 30 tablet 2  . topiramate (TOPAMAX) 100 MG tablet One tab by mouth twice daily 60 tablet 2  . traZODone (DESYREL) 100 MG tablet TAKE 1 TABLET(100 MG) BY MOUTH AT BEDTIME 30 tablet 0  . traZODone (DESYREL) 50 MG tablet TAKE 1/2 TO 1 TABLET(25 TO 50 MG) BY MOUTH AT BEDTIME AS NEEDED FOR SLEEP 30 tablet 0  . UBRELVY 100 MG TABS TAKE 1 TABLET(100 MG) BY MOUTH 1 TIME EVERY 2 HOURS AS DIRECTED AS NEEDED. MAXIMUM 2 TABLETS IN 24 HOURS 16 tablet 2  . valACYclovir (VALTREX) 1000 MG tablet Take 1 tablet (1,000 mg total) by mouth daily. 5 tablet 4  . verapamil (CALAN) 120 MG tablet TAKE 1 TABLET(120 MG) BY MOUTH EVERY MORNING 90 tablet 1   No current facility-administered medications on file prior to visit.    ALLERGIES: Allergies  Allergen Reactions  . Codeine Phosphate Other (See Comments)    REACTION: breathing difficulty  . Penicillins Rash and Other (See Comments)    Break out in sweats/ Rash amoxicillin. Denies airway involvement Has patient had a PCN reaction causing immediate rash, facial/tongue/throat swelling, SOB or lightheadedness with hypotension: Yes Has patient had a PCN reaction causing severe rash involving mucus membranes or skin necrosis: Yes Has patient had a PCN reaction that required hospitalization No Has patient had a PCN reaction occurring within the last 10 years: No If all of the above answers are "NO", then may proceed with Cephalosporin u    FAMILY HISTORY: Family History  Problem Relation Age of Onset  . Anemia Mother   . Hypertension Daughter   . Diabetes Daughter   . Migraines Daughter     SOCIAL HISTORY: Social History   Socioeconomic History  . Marital status: Legally Separated    Spouse name: Not on file  . Number of children: 8  . Years of education: 8  . Highest education level: 8th grade  Occupational History    . Not on file  Tobacco Use  . Smoking status: Light Tobacco Smoker    Packs/day: 0.25    Years: 53.00    Pack years: 13.25    Types: Cigars    Start date: 09/29/1962    Last attempt to quit: 08/04/2016    Years since quitting: 3.8  . Smokeless tobacco: Former Systems developer    Quit date: 03/08/2017  . Tobacco comment: cigar occassionally   Vaping Use  . Vaping Use: Never used  Substance and Sexual Activity  . Alcohol use: No    Comment: Last drink "30 years ago"  . 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   [name] Sexual activity: Yes    Birth control/protection: Condom    Comment: Multiple sex partners. 60 in last year.  Other Topics Concern  . Not on file  Social History Narrative   Pt lives alone. No stairs in home. Pt has completed 8th grade.       Caffeine 3 cups coffee day      Exercise - yes walks and plays tennis   Social Determinants of Health   Financial  Resource Strain:   . Difficulty of Paying Living Expenses: Not on file  Food Insecurity:   . Worried About Charity fundraiser in the Last Year: Not on file  . Ran Out of Food in the Last Year: Not on file  Transportation Needs:   . Lack of Transportation (Medical): Not on file  . Lack of Transportation (Non-Medical): Not on file  Physical Activity:   . Days of Exercise per Week: Not on file  . Minutes of Exercise per Session: Not on file  Stress:   . Feeling of Stress : Not on file  Social Connections:   . Frequency of Communication with Friends and Family: Not on file  . Frequency of Social Gatherings with Friends and Family: Not on file  . Attends Religious Services: Not on file  . Active Member of Clubs or Organizations: Not on file  . Attends Archivist Meetings: Not on file  . Marital Status: Not on file  Intimate Partner Violence:   . Fear of Current or Ex-Partner: Not on file  . Emotionally Abused: Not on file  . Physically Abused: Not on file  . Sexually  Abused: Not on file    PHYSICAL EXAM: *** General: No acute distress.  Patient appears well-groomed.   Head:  Normocephalic/atraumatic Eyes:  Fundi examined but not visualized Neck: supple, no paraspinal tenderness, full range of motion Heart:  Regular rate and rhythm Lungs:  Clear to auscultation bilaterally Back: No paraspinal tenderness Neurological Exam: alert and oriented to person, place, and time. Attention span and concentration intact, recent and remote memory intact, fund of knowledge intact.  Speech fluent and not dysarthric, language intact.  CN II-XII intact. Bulk and tone normal, muscle strength 5/5 throughout.  Sensation to light touch, temperature and vibration intact.  Deep tendon reflexes 2+ throughout, toes downgoing.  Finger to nose and heel to shin testing intact.  Gait normal, Romberg negative.  IMPRESSION: Migraine without aura, without status migrainosus, not intractable  PLAN: 1.  Migraine prevention:  topiramate 100mg  twice daily 2.  Migraine rescue:  Ubrelvy 100mg  3.  Limit use of pain relievers to no more than 2 days out of week to prevent risk of rebound or medication-overuse headache. 4.  Keep headache diary 5.  Follow up in one year.  Metta Clines, DO  CC:  Grant Fontana, MD

## 2020-06-01 ENCOUNTER — Ambulatory Visit: Payer: Medicare Other | Admitting: Neurology

## 2020-06-07 ENCOUNTER — Other Ambulatory Visit: Payer: Self-pay | Admitting: Registered Nurse

## 2020-06-07 ENCOUNTER — Other Ambulatory Visit: Payer: Self-pay | Admitting: Orthopaedic Surgery

## 2020-06-07 ENCOUNTER — Other Ambulatory Visit: Payer: Self-pay | Admitting: Family Medicine

## 2020-06-07 DIAGNOSIS — F5104 Psychophysiologic insomnia: Secondary | ICD-10-CM

## 2020-06-08 NOTE — Telephone Encounter (Signed)
Patient is requesting a refill of the following medications: Requested Prescriptions   Pending Prescriptions Disp Refills   traZODone (DESYREL) 100 MG tablet [Pharmacy Med Name: TRAZODONE 100MG  TABLETS] 30 tablet 0    Sig: TAKE 1 TABLET(100 MG) BY MOUTH AT BEDTIME    Date of patient request: 06/07/2020 Last office visit: 03/12/2020 Date of last refill: 05/14/2020 Last refill amount: 30tab Follow up time period per chart: 3mo

## 2020-06-09 ENCOUNTER — Other Ambulatory Visit: Payer: Self-pay | Admitting: Family Medicine

## 2020-06-09 ENCOUNTER — Other Ambulatory Visit: Payer: Self-pay | Admitting: Neurology

## 2020-06-09 DIAGNOSIS — G43009 Migraine without aura, not intractable, without status migrainosus: Secondary | ICD-10-CM

## 2020-06-09 NOTE — Telephone Encounter (Signed)
Requested Prescriptions  Pending Prescriptions Disp Refills   metoprolol succinate (TOPROL-XL) 25 MG 24 hr tablet [Pharmacy Med Name: METOPROLOL ER SUCCINATE 25MG  TABS] 90 tablet 1    Sig: TAKE 1 TABLET(25 MG) BY MOUTH DAILY     Cardiovascular:  Beta Blockers Passed - 06/09/2020 10:55 AM      Passed - Last BP in normal range    BP Readings from Last 1 Encounters:  03/12/20 120/71         Passed - Last Heart Rate in normal range    Pulse Readings from Last 1 Encounters:  03/12/20 65         Passed - Valid encounter within last 6 months    Recent Outpatient Visits          2 months ago Vesicular rash   [facility] Lilia Argue, MD   3 months ago Medicare annual wellness visit, subsequent   [facility] Lilia Argue, MD   8 months ago Psychophysiological insomnia   [facility] Lilia Argue, MD   1 year ago Hyperlipidemia, unspecified hyperlipidemia type   [facility] Lilia Argue, MD   1 year ago Hyperlipidemia, unspecified hyperlipidemia type   [facility] Lilia Argue, MD      Future Appointments            In 2 days Rutherford Guys, MD Primary Care at [place], [facility]

## 2020-06-11 ENCOUNTER — Other Ambulatory Visit: Payer: Self-pay

## 2020-06-11 ENCOUNTER — Encounter: Payer: Self-pay | Admitting: Family Medicine

## 2020-06-11 ENCOUNTER — Other Ambulatory Visit: Payer: Self-pay | Admitting: Neurology

## 2020-06-11 ENCOUNTER — Ambulatory Visit (INDEPENDENT_AMBULATORY_CARE_PROVIDER_SITE_OTHER): Payer: Medicare Other | Admitting: Family Medicine

## 2020-06-11 ENCOUNTER — Telehealth: Payer: Self-pay | Admitting: Neurology

## 2020-06-11 VITALS — BP 127/78 | HR 58 | Temp 98.7°F | Ht 66.0 in | Wt 173.4 lb

## 2020-06-11 DIAGNOSIS — Z23 Encounter for immunization: Secondary | ICD-10-CM

## 2020-06-11 DIAGNOSIS — B009 Herpesviral infection, unspecified: Secondary | ICD-10-CM

## 2020-06-11 DIAGNOSIS — K219 Gastro-esophageal reflux disease without esophagitis: Secondary | ICD-10-CM | POA: Diagnosis not present

## 2020-06-11 DIAGNOSIS — E785 Hyperlipidemia, unspecified: Secondary | ICD-10-CM | POA: Diagnosis not present

## 2020-06-11 DIAGNOSIS — N1831 Chronic kidney disease, stage 3a: Secondary | ICD-10-CM | POA: Diagnosis not present

## 2020-06-11 DIAGNOSIS — I10 Essential (primary) hypertension: Secondary | ICD-10-CM

## 2020-06-11 DIAGNOSIS — K0889 Other specified disorders of teeth and supporting structures: Secondary | ICD-10-CM

## 2020-06-11 MED ORDER — OMEPRAZOLE 20 MG PO CPDR: 20.0000 mg | DELAYED_RELEASE_CAPSULE | Freq: Two times a day (BID) | ORAL | 1 refills | Status: DC

## 2020-06-11 MED ORDER — VERAPAMIL HCL 120 MG PO TABS: ORAL_TABLET | ORAL | 1 refills | Status: DC

## 2020-06-11 MED ORDER — UBRELVY 100 MG PO TABS
ORAL_TABLET | ORAL | 5 refills | Status: DC
Start: 2020-06-11 — End: 2020-11-05

## 2020-06-11 MED ORDER — AMLODIPINE BESYLATE 10 MG PO TABS: ORAL_TABLET | ORAL | 1 refills | Status: DC

## 2020-06-11 MED ORDER — CLINDAMYCIN HCL 300 MG PO CAPS: 300.0000 mg | ORAL_CAPSULE | Freq: Three times a day (TID) | ORAL | 0 refills | Status: DC

## 2020-06-11 MED ORDER — VALACYCLOVIR HCL 500 MG PO TABS: 500.0000 mg | ORAL_TABLET | Freq: Two times a day (BID) | ORAL | 3 refills | Status: DC

## 2020-06-11 NOTE — Progress Notes (Signed)
9/13/20219:51 AM  Jaime Barnes July 16, 1951, 69 y.o., male [id number]  Chief Complaint  Patient presents with  . Follow-up    htn, hlp, rash cleared    HPI:   Patient is a 69 y.o. male with past medical history significant for HTN, PAD, HLP, CKD3, non obstructive CAD, barretts, alcoholic cirrhosis and migraineswho presents today for routine followup  Last ov June 2021 - rx for valtrex, referred to ortho He is overall doing well He is taking meds as rx Herpes resolved completely with valtrex, has not had recurrence He is taking meds as prescribed wo side effects He needs to see dentist, having left lower tooth pain and swelling Cont with see GI, Dr Michail Sermon Sees renal sept 15th Was doing PT for his neck pain, still has residual numbness/tingling on right pinkie He is requesting his flu vaccine today  Lab Results  Component Value Date   CREATININE 1.62 (H) 03/12/2020   BUN 16 03/12/2020   NA 142 03/12/2020   K 4.3 03/12/2020   CL 108 (H) 03/12/2020   CO2 16 (L) 03/12/2020   Lab Results  Component Value Date   CHOL 100 03/12/2020   HDL 29 (L) 03/12/2020   LDLCALC 36 03/12/2020   LDLDIRECT 156 (H) 06/09/2011   TRIG 220 (H) 03/12/2020   CHOLHDL 3.4 03/12/2020   Lab Results  Component Value Date   ALT 12 03/12/2020   AST 17 03/12/2020   ALKPHOS 102 03/12/2020   BILITOT 0.3 03/12/2020     Depression screen PHQ 2/9 03/12/2020 02/20/2020 09/20/2019  Decreased Interest 0 0 0  Down, Depressed, Hopeless 0 0 0  PHQ - 2 Score 0 0 0  Altered sleeping - - -  Tired, decreased energy - - -  Change in appetite - - -  Feeling bad or failure about yourself  - - -  Trouble concentrating - - -  Moving slowly or fidgety/restless - - -  Suicidal thoughts - - -  PHQ-9 Score - - -  Difficult doing work/chores - - -  Some recent data might be hidden    Fall Risk  03/12/2020 02/20/2020 09/20/2019 06/16/2019 03/24/2019  Falls in the past year? 0 1 0 0 0  Number falls in past yr:  - 0 0 - 0  Comment - slipped on grass / no injury - - -  Injury with Fall? - 0 0 - 0  Follow up Falls evaluation completed Falls evaluation completed;Education provided - - -     Allergies  Allergen Reactions  . Codeine Phosphate Other (See Comments)    REACTION: breathing difficulty  . Penicillins Rash and Other (See Comments)    Break out in sweats/ Rash amoxicillin. Denies airway involvement Has patient had a PCN reaction causing immediate rash, facial/tongue/throat swelling, SOB or lightheadedness with hypotension: Yes Has patient had a PCN reaction causing severe rash involving mucus membranes or skin necrosis: Yes Has patient had a PCN reaction that required hospitalization No Has patient had a PCN reaction occurring within the last 10 years: No If all of the above answers are "NO", then may proceed with Cephalosporin u    Prior to Admission medications   Medication Sig Start Date End Date Taking? Authorizing Provider  albuterol (VENTOLIN HFA) 108 (90 Base) MCG/ACT inhaler INHALE 2 PUFFS INTO THE LUNGS EVERY 6 HOURS AS NEEDED FOR WHEEZING OR SHORTNESS OF BREATH 05/14/20  Yes Rutherford Guys, MD  amLODipine (NORVASC) 10 MG tablet TAKE 1 TABLET(10 MG) BY  MOUTH DAILY 01/28/20  Yes Rutherford Guys, MD  aspirin 81 MG chewable tablet Chew 81 mg by mouth daily.   Yes [provider]  atorvastatin (LIPITOR) 80 MG tablet Take 1 tablet (80 mg total) by mouth daily at 6 PM. TAKE 1 TABLET(40 MG) BY MOUTH DAILY 03/12/20  Yes Rutherford Guys, MD  gabapentin (NEURONTIN) 300 MG capsule TAKE 2 CAPSULES(600 MG) BY MOUTH THREE TIMES DAILY 03/12/20  Yes Rutherford Guys, MD  HYDROcodone-acetaminophen (NORCO/VICODIN) 5-325 MG tablet TK 1 T PO QID 04/25/19  Yes [provider]  ibuprofen (ADVIL) 800 MG tablet  04/13/19  Yes [provider]  MAGNESIUM-OXIDE 400 (241.3 Mg) MG tablet TAKE 1 TABLET BY MOUTH EVERY DAY 04/10/20  Yes Skeet Latch, MD  meclizine (ANTIVERT) 12.5 MG  tablet TAKE 1 TABLET BY MOUTH THREE TIMES DAILY AS NEEDED FOR DIZZINESS 12/30/17  Yes Sherene Sires, DO  metaxalone (SKELAXIN) 800 MG tablet Take 1 tablet (800 mg total) by mouth 3 (three) times daily. 03/22/20  Yes Leandrew Koyanagi, MD  methocarbamol (ROBAXIN) 750 MG tablet TAKE 1 TABLET(750 MG) BY MOUTH TWICE DAILY AS NEEDED FOR MUSCLE SPASMS 06/07/20  Yes Leandrew Koyanagi, MD  metoprolol succinate (TOPROL-XL) 25 MG 24 hr tablet TAKE 1 TABLET(25 MG) BY MOUTH DAILY 06/09/20  Yes Rutherford Guys, MD  omeprazole (PRILOSEC) 20 MG capsule TAKE 1 CAPSULE(20 MG) BY MOUTH TWICE DAILY 02/23/20  Yes Rutherford Guys, MD  predniSONE (STERAPRED UNI-PAK 21 TAB) 10 MG (21) TBPK tablet FOLLOW PACKAGE DIRECTIONS 04/20/20  Yes Leandrew Koyanagi, MD  sildenafil (VIAGRA) 100 MG tablet TAKE 1/2 (ONE-HALF) TABLET BY MOUTH ONCE DAILY AS NEEDED FOR  ERECTILE  DYSFUNCTION 09/20/19  Yes Rutherford Guys, MD  tiZANidine (ZANAFLEX) 4 MG tablet TAKE 1 TABLET(4 MG) BY MOUTH AT BEDTIME AS NEEDED FOR MUSCLE SPASMS 04/10/20  Yes Rutherford Guys, MD  topiramate (TOPAMAX) 100 MG tablet One tab by mouth twice daily 04/23/20  Yes Rutherford Guys, MD  traZODone (DESYREL) 100 MG tablet TAKE 1 TABLET(100 MG) BY MOUTH AT BEDTIME 06/08/20  Yes Rutherford Guys, MD  traZODone (DESYREL) 50 MG tablet TAKE 1/2 TO 1 TABLET(25 TO 50 MG) BY MOUTH AT BEDTIME AS NEEDED FOR SLEEP 03/06/20  Yes Pamella Pert, Howell Groesbeck M, MD  UBRELVY 100 MG TABS TAKE 1 TABLET(100 MG) BY MOUTH 1 TIME EVERY 2 HOURS AS DIRECTED AS NEEDED. MAXIMUM 2 TABLETS IN 24 HOURS 04/09/20  Yes Jaffe, Adam R, DO  valACYclovir (VALTREX) 1000 MG tablet TAKE 1 TABLET(1000 MG) BY MOUTH DAILY 06/07/20  Yes Rutherford Guys, MD  verapamil (CALAN) 120 MG tablet TAKE 1 TABLET(120 MG) BY MOUTH EVERY MORNING 02/23/20  Yes Rutherford Guys, MD    Past Medical History:  Diagnosis Date  . Barrett's esophagus 2011   Recommend repeat EGD for surveillance in 2 yrs-[facility]  . Bipolar disorder ([place])   . Chronic kidney disease     . Cirrhosis ([place])   . Diverticulosis of colon   . GANGLION CYST, TENDON SHEATH 06/26/2010  . Hyperlipidemia   . Hypertension   . Hypertriglyceridemia 01/21/2019  . Impotence   . Migraine   . Pancreatitis   . Pulmonary nodule   . Pulmonary nodule seen on imaging study 02/06/2011   Followup CT in December 2014 with 20 months of stability at 4 mm.  No further imaging recommended     Past Surgical History:  Procedure Laterality Date  . CARDIAC CATHETERIZATION  2005   Nonobstructive, <40%  . TONSILLECTOMY Bilateral 1961    Social History   Tobacco Use  . Smoking status: Light Tobacco Smoker    Packs/day: 0.25    Years: 53.00    Pack years: 13.25    Types: Cigars    Start date: 09/29/1962    Last attempt to quit: 08/04/2016    Years since quitting: 3.8  . Smokeless tobacco: Former Systems developer    Quit date: 03/08/2017  . Tobacco comment: cigar occassionally   Substance Use Topics  . Alcohol use: No    Comment: Last drink "30 years ago"    Family History  Problem Relation Age of Onset  . Anemia Mother   . Hypertension Daughter   . Diabetes Daughter   . Migraines Daughter     Review of Systems  Constitutional: Negative for chills and fever.  Respiratory: Negative for cough and shortness of breath.   Cardiovascular: Negative for chest pain, palpitations and leg swelling.  Gastrointestinal: Negative for abdominal pain, blood in stool, heartburn, melena, nausea and vomiting.  per hpi   OBJECTIVE:  Today's Vitals   06/11/20 0947  BP: 127/78  Pulse: (!) 58  Temp: 98.7 F (37.1 C)  SpO2: 97%  Weight: 173 lb 6.4 oz (78.7 kg)  Height: 5\' 6"  (1.676 m)   Body mass index is 27.99 kg/m.   Physical Exam Vitals and nursing note reviewed.  Constitutional:      Appearance: He is well-developed.  HENT:     Head: Normocephalic and atraumatic.     Mouth/Throat:     Dentition: Dental abscesses present.   Eyes:     Extraocular Movements: Extraocular movements intact.      Conjunctiva/sclera: Conjunctivae normal.     Pupils: Pupils are equal, round, and reactive to light.  Cardiovascular:     Rate and Rhythm: Normal rate and regular rhythm.     Heart sounds: No murmur heard.  No friction rub. No gallop.   Pulmonary:     Effort: Pulmonary effort is normal.     Breath sounds: Normal breath sounds. No wheezing, rhonchi or rales.  Musculoskeletal:     Cervical back: Neck supple.     Right lower leg: No edema.     Left lower leg: No edema.  Skin:    General: Skin is warm and dry.  Neurological:     Mental Status: He is alert and oriented to person, place, and time.     No results found for this or any previous visit (from the past 24 hour(s)).  No results found.   ASSESSMENT and PLAN  1. Essential hypertension, benign Controlled. Continue current regime.  - verapamil (CALAN) 120 MG tablet; TAKE 1 TABLET(120 MG) BY MOUTH EVERY MORNING  2. Stage 3a chronic kidney disease Has upcoming appt with renal  3. Gastroesophageal reflux disease without esophagitis Controlled. H/o barretts - omeprazole (PRILOSEC) 20 MG capsule; Take 1 capsule (20 mg total) by mouth 2 (two) times daily before a meal.  4. Hyperlipidemia LDL goal <70 Checking labs today, medications will be adjusted as needed.  - Comprehensive metabolic panel - Lipid panel  5. HSV-2 (herpes simplex virus 2) infection Resolved, valtrex prn for outbreak  6. Tooth pain rx for clinda, reviewed r/se/b, seek dental care  7. Need for prophylactic vaccination and inoculation against influenza - Flu Vaccine QUAD High Dose(Fluad)  Other orders - amLODipine (NORVASC) 10 MG tablet; TAKE 1 TABLET(10 MG) BY MOUTH DAILY - valACYclovir (VALTREX) 500  MG tablet; Take 1 tablet (500 mg total) by mouth 2 (two) times daily. - clindamycin (CLEOCIN) 300 MG capsule; Take 1 capsule (300 mg total) by mouth 3 (three) times daily.  Return in about 6 months (around 12/09/2020).    Rutherford Guys, MD Primary  Care at [facility] [place], [place] [address] Ph.  [phone] Fax [phone]

## 2020-06-11 NOTE — Telephone Encounter (Signed)
done

## 2020-06-11 NOTE — Telephone Encounter (Signed)
Patient is requesting refill of Ubrelvy sent to [facility] on [address]. Pt has a f/u sch for first avail 11/05/20.

## 2020-06-11 NOTE — Patient Instructions (Signed)
° ° ° °  If you have lab work done today you will be contacted with your lab results within the next 2 weeks.  If you have not heard from us then please contact us. The fastest way to get your results is to register for My Chart. ° ° °IF you received an x-ray today, you will receive an invoice from [facility]. Please contact [facility] at [phone] with questions or concerns regarding your invoice.  ° °IF you received labwork today, you will receive an invoice from [facility]. Please contact [facility] at [phone] with questions or concerns regarding your invoice.  ° °Our billing staff will not be able to assist you with questions regarding bills from these companies. ° °You will be contacted with the lab results as soon as they are available. The fastest way to get your results is to activate your My Chart account. Instructions are located on the last page of this paperwork. If you have not heard from us regarding the results in 2 weeks, please contact this office. °  ° ° ° °

## 2020-06-12 LAB — LIPID PANEL
Chol/HDL Ratio: 3.9 ratio (ref 0.0–5.0)
Cholesterol, Total: 116 mg/dL (ref 100–199)
HDL: 30 mg/dL — ABNORMAL LOW (ref >39)
LDL Chol Calc (NIH): 60 mg/dL (ref 0–99)
Triglycerides: 152 mg/dL — ABNORMAL HIGH (ref 0–149)
VLDL Cholesterol Cal: 26 mg/dL (ref 5–40)

## 2020-06-12 LAB — COMPREHENSIVE METABOLIC PANEL
ALT: 11 IU/L (ref 0–44)
AST: 12 IU/L (ref 0–40)
Albumin/Globulin Ratio: 1.8 (ref 1.2–2.2)
Albumin: 4.4 g/dL (ref 3.8–4.8)
Alkaline Phosphatase: 93 IU/L (ref 44–121)
BUN/Creatinine Ratio: 9 — ABNORMAL LOW (ref 10–24)
BUN: 12 mg/dL (ref 8–27)
Bilirubin Total: 0.3 mg/dL (ref 0.0–1.2)
CO2: 20 mmol/L (ref 20–29)
Calcium: 9.4 mg/dL (ref 8.6–10.2)
Chloride: 109 mmol/L — ABNORMAL HIGH (ref 96–106)
Creatinine, Ser: 1.38 mg/dL — ABNORMAL HIGH (ref 0.76–1.27)
GFR calc Af Amer: 60 mL/min/{1.73_m2} (ref >59)
GFR calc non Af Amer: 52 mL/min/{1.73_m2} — ABNORMAL LOW (ref >59)
Globulin, Total: 2.4 g/dL (ref 1.5–4.5)
Glucose: 99 mg/dL (ref 65–99)
Potassium: 4.5 mmol/L (ref 3.5–5.2)
Sodium: 141 mmol/L (ref 134–144)
Total Protein: 6.8 g/dL (ref 6.0–8.5)

## 2020-06-13 DIAGNOSIS — I129 Hypertensive chronic kidney disease with stage 1 through stage 4 chronic kidney disease, or unspecified chronic kidney disease: Secondary | ICD-10-CM | POA: Diagnosis not present

## 2020-06-13 DIAGNOSIS — N2581 Secondary hyperparathyroidism of renal origin: Secondary | ICD-10-CM | POA: Diagnosis not present

## 2020-06-13 DIAGNOSIS — K7460 Unspecified cirrhosis of liver: Secondary | ICD-10-CM | POA: Diagnosis not present

## 2020-06-13 DIAGNOSIS — N1831 Chronic kidney disease, stage 3a: Secondary | ICD-10-CM | POA: Diagnosis not present

## 2020-06-15 ENCOUNTER — Ambulatory Visit: Payer: Medicare Other | Admitting: Neurology

## 2020-06-18 DIAGNOSIS — H21233 Degeneration of iris (pigmentary), bilateral: Secondary | ICD-10-CM | POA: Diagnosis not present

## 2020-06-18 DIAGNOSIS — H04123 Dry eye syndrome of bilateral lacrimal glands: Secondary | ICD-10-CM | POA: Diagnosis not present

## 2020-06-18 DIAGNOSIS — H10413 Chronic giant papillary conjunctivitis, bilateral: Secondary | ICD-10-CM | POA: Diagnosis not present

## 2020-06-18 DIAGNOSIS — H25813 Combined forms of age-related cataract, bilateral: Secondary | ICD-10-CM | POA: Diagnosis not present

## 2020-06-26 ENCOUNTER — Encounter: Payer: Self-pay | Admitting: Radiology

## 2020-08-09 ENCOUNTER — Other Ambulatory Visit: Payer: Self-pay

## 2020-08-09 ENCOUNTER — Ambulatory Visit (INDEPENDENT_AMBULATORY_CARE_PROVIDER_SITE_OTHER): Payer: Medicare Other | Admitting: Family Medicine

## 2020-08-09 ENCOUNTER — Encounter: Payer: Self-pay | Admitting: Family Medicine

## 2020-08-09 ENCOUNTER — Other Ambulatory Visit: Payer: Self-pay | Admitting: Family Medicine

## 2020-08-09 VITALS — BP 126/74 | HR 65 | Temp 97.9°F | Ht 66.0 in | Wt 171.0 lb

## 2020-08-09 DIAGNOSIS — M5441 Lumbago with sciatica, right side: Secondary | ICD-10-CM

## 2020-08-09 DIAGNOSIS — M5442 Lumbago with sciatica, left side: Secondary | ICD-10-CM

## 2020-08-09 MED ORDER — MELOXICAM 15 MG PO TABS: 15.0000 mg | ORAL_TABLET | Freq: Every day | ORAL | 0 refills | Status: DC

## 2020-08-09 NOTE — Progress Notes (Signed)
11/11/20218:28 AM  Jaime Barnes [birth year], 69 y.o., male [id number]  Chief Complaint  Patient presents with  . Back Pain    lower back pain that goes down legs x 1 .5 week --current meds not helping     HPI:   Patient is a 69 y.o. male with past medical history significant for HTN, PAD, HLP, CKD3, non obstructive CAD, barretts, alcoholic cirrhosis and migraineswho presents today for back pain.   Was working on a car with his cousin 1.5 weeks ago Twisted wrong and has since had pain Lower back pain, radiates to bilateral upper legs Happened a week and a half ago Muscle relaxers and extra strength Tylenol doesn't improve Muscle relaxer 2-3 times per day Tylenol 2-3 times per day Has since stopped both of these medications In the morning its the worst Denies spinal tenderness, numbness and tingling    Depression screen [facility] 2/9 08/09/2020 03/12/2020 02/20/2020  Decreased Interest 0 0 0  Down, Depressed, Hopeless 0 0 0  PHQ - 2 Score 0 0 0  Altered sleeping - - -  Tired, decreased energy - - -  Change in appetite - - -  Feeling bad or failure about yourself  - - -  Trouble concentrating - - -  Moving slowly or fidgety/restless - - -  Suicidal thoughts - - -  PHQ-9 Score - - -  Difficult doing work/chores - - -  Some recent data might be hidden    Fall Risk  08/09/2020 03/12/2020 02/20/2020 09/20/2019 06/16/2019  Falls in the past year? 0 0 1 0 0  Number falls in past yr: 0 - 0 0 -  Comment - - slipped on grass / no injury - -  Injury with Fall? 0 - 0 0 -  Follow up Falls evaluation completed Falls evaluation completed Falls evaluation completed;Education provided - -     Allergies  Allergen Reactions  . Codeine Phosphate Other (See Comments)    REACTION: breathing difficulty  . Penicillins Rash and Other (See Comments)    Break out in sweats/ Rash amoxicillin. Denies airway involvement Has patient had a PCN reaction causing immediate rash, facial/tongue/throat  swelling, SOB or lightheadedness with hypotension: Yes Has patient had a PCN reaction causing severe rash involving mucus membranes or skin necrosis: Yes Has patient had a PCN reaction that required hospitalization No Has patient had a PCN reaction occurring within the last 10 years: No If all of the above answers are "NO", then may proceed with Cephalosporin u    Prior to Admission medications   Medication Sig Start Date End Date Taking? Authorizing Provider  albuterol (VENTOLIN HFA) 108 (90 Base) MCG/ACT inhaler INHALE 2 PUFFS INTO THE LUNGS EVERY 6 HOURS AS NEEDED FOR WHEEZING OR SHORTNESS OF BREATH 05/14/20  Yes Rutherford Guys, MD  amLODipine (NORVASC) 10 MG tablet TAKE 1 TABLET(10 MG) BY MOUTH DAILY 06/11/20  Yes Rutherford Guys, MD  aspirin 81 MG chewable tablet Chew 81 mg by mouth daily.   Yes [provider]  atorvastatin (LIPITOR) 80 MG tablet Take 1 tablet (80 mg total) by mouth daily at 6 PM. TAKE 1 TABLET(40 MG) BY MOUTH DAILY 03/12/20  Yes Rutherford Guys, MD  clindamycin (CLEOCIN) 300 MG capsule Take 1 capsule (300 mg total) by mouth 3 (three) times daily. 06/11/20  Yes Rutherford Guys, MD  gabapentin (NEURONTIN) 300 MG capsule TAKE 2 CAPSULES(600 MG) BY MOUTH THREE TIMES DAILY 03/12/20  Yes Rutherford Guys, MD  HYDROcodone-acetaminophen (NORCO/VICODIN) 5-325 MG tablet TK 1 T PO QID 04/25/19  Yes [provider]  ibuprofen (ADVIL) 800 MG tablet  04/13/19  Yes [provider]  MAGNESIUM-OXIDE 400 (241.3 Mg) MG tablet TAKE 1 TABLET BY MOUTH EVERY DAY 04/10/20  Yes Skeet Latch, MD  meclizine (ANTIVERT) 12.5 MG tablet TAKE 1 TABLET BY MOUTH THREE TIMES DAILY AS NEEDED FOR DIZZINESS 12/30/17  Yes Criss Rosales, Scott, DO  methocarbamol (ROBAXIN) 750 MG tablet TAKE 1 TABLET(750 MG) BY MOUTH TWICE DAILY AS NEEDED FOR MUSCLE SPASMS 06/07/20  Yes Leandrew Koyanagi, MD  metoprolol succinate (TOPROL-XL) 25 MG 24 hr tablet TAKE 1 TABLET(25 MG) BY MOUTH DAILY 06/09/20  Yes Rutherford Guys, MD  omeprazole (PRILOSEC) 20 MG capsule Take 1 capsule (20 mg total) by mouth 2 (two) times daily before a meal. 06/11/20  Yes Rutherford Guys, MD  sildenafil (VIAGRA) 100 MG tablet TAKE 1/2 (ONE-HALF) TABLET BY MOUTH ONCE DAILY AS NEEDED FOR  ERECTILE  DYSFUNCTION 09/20/19  Yes Rutherford Guys, MD  tiZANidine (ZANAFLEX) 4 MG tablet TAKE 1 TABLET(4 MG) BY MOUTH AT BEDTIME AS NEEDED FOR MUSCLE SPASMS 04/10/20  Yes Rutherford Guys, MD  topiramate (TOPAMAX) 100 MG tablet One tab by mouth twice daily 04/23/20  Yes Rutherford Guys, MD  traZODone (DESYREL) 100 MG tablet TAKE 1 TABLET(100 MG) BY MOUTH AT BEDTIME 06/08/20  Yes Pamella Pert, Irma M, MD  Ubrogepant (UBRELVY) 100 MG TABS TAKE 1 TABLET(100 MG) BY MOUTH 1 TIME EVERY 2 HOURS AS DIRECTED AS NEEDED. MAXIMUM 2 TABLETS IN 24 HOURS 06/11/20  Yes Jaffe, Adam R, DO  valACYclovir (VALTREX) 500 MG tablet Take 1 tablet (500 mg total) by mouth 2 (two) times daily. 06/11/20  Yes Rutherford Guys, MD  verapamil (CALAN) 120 MG tablet TAKE 1 TABLET(120 MG) BY MOUTH EVERY MORNING 06/11/20  Yes Rutherford Guys, MD    Past Medical History:  Diagnosis Date  . Barrett's esophagus 2011   Recommend repeat EGD for surveillance in 2 yrs-[facility]  . Bipolar disorder ([place])   . Chronic kidney disease   . Cirrhosis ([place])   . Diverticulosis of colon   . GANGLION CYST, TENDON SHEATH 06/26/2010  . Hyperlipidemia   . Hypertension   . Hypertriglyceridemia 01/21/2019  . Impotence   . Migraine   . Pancreatitis   . Pulmonary nodule   . Pulmonary nodule seen on imaging study 02/06/2011   Followup CT in December 2014 with 20 months of stability at 4 mm.  No further imaging recommended     Past Surgical History:  Procedure Laterality Date  . CARDIAC CATHETERIZATION  2005   Nonobstructive, <40%  . TONSILLECTOMY Bilateral 1961    Social History   Tobacco Use  . Smoking status: Light Tobacco Smoker    Packs/day: 0.25    Years: 53.00    Pack years: 13.25     Types: Cigars    Start date: 09/29/1962    Last attempt to quit: 08/04/2016    Years since quitting: 4.0  . Smokeless tobacco: Former Systems developer    Quit date: 03/08/2017  . Tobacco comment: cigar occassionally   Substance Use Topics  . Alcohol use: No    Comment: Last drink "30 years ago"    Family History  Problem Relation Age of Onset  . Anemia Mother   . Hypertension Daughter   . Diabetes Daughter   . Migraines Daughter     Review of Systems  Respiratory: Negative  for shortness of breath.   Cardiovascular: Negative for chest pain.  Musculoskeletal: Positive for back pain. Negative for falls, joint pain, myalgias and neck pain.  Neurological: Negative for dizziness, tingling, sensory change, focal weakness, weakness and headaches.     OBJECTIVE:  Today's Vitals   08/09/20 0753  BP: 126/74  Pulse: 65  Temp: 97.9 F (36.6 C)  SpO2: 97%  Weight: 171 lb (77.6 kg)  Height: 5\' 6"  (7.014 m)   Body mass index is 27.6 kg/m.   Physical Exam Vitals reviewed.  Constitutional:      Appearance: Normal appearance.  HENT:     Head: Normocephalic and atraumatic.  Eyes:     Conjunctiva/sclera: Conjunctivae normal.     Pupils: Pupils are equal, round, and reactive to light.  Cardiovascular:     Rate and Rhythm: Normal rate and regular rhythm.     Pulses: Normal pulses.     Heart sounds: Normal heart sounds. No murmur heard.  No friction rub. No gallop.   Pulmonary:     Effort: Pulmonary effort is normal. No respiratory distress.     Breath sounds: Normal breath sounds. No stridor. No wheezing or rales.  Abdominal:     General: Bowel sounds are normal.     Palpations: Abdomen is soft.     Tenderness: There is no abdominal tenderness.  Musculoskeletal:     Thoracic back: Normal.     Lumbar back: Normal.       Back:     Right upper leg: Normal.     Left upper leg: Normal.  Skin:    General: Skin is warm and dry.  Neurological:     General: No focal deficit present.      Mental Status: He is alert and oriented to person, place, and time.  Psychiatric:        Mood and Affect: Mood normal.        Behavior: Behavior normal.     No results found for this or any previous visit (from the past 24 hour(s)).  No results found.   ASSESSMENT and PLAN  Problem List Items Addressed This Visit      Other   Back pain - Primary   Relevant Medications   meloxicam (MOBIC) 15 MG tablet daily Rest Ice/heat Tylenol as needed  Meloxicam daily: don't use Ibuprofen or aleve while taking this medication Should improve in ~4 weeks Muscle Relaxant at night        Return in about 6 weeks (around 09/20/2020), or if symptoms worsen or fail to improve.    Huston Foley Ashritha Desrosiers, FNP-BC Primary Care at [facility] [place], [place] [address] Ph.  [phone] Fax [phone]

## 2020-08-09 NOTE — Telephone Encounter (Signed)
   Notes to clinic:  Patient requests 90 days supply   Requested Prescriptions  Pending Prescriptions Disp Refills   meloxicam (MOBIC) 15 MG tablet [Pharmacy Med Name: MELOXICAM 15MG  TABLETS] 90 tablet     Sig: TAKE 1 TABLET(15 MG) BY MOUTH DAILY      Analgesics:  COX2 Inhibitors Failed - 08/09/2020  8:42 AM      Failed - HGB in normal range and within 360 days    Hemoglobin  Date Value Ref Range Status  07/20/2018 13.5 13.0 - 17.7 g/dL Final          Failed - Cr in normal range and within 360 days    Creat  Date Value Ref Range Status  12/05/2015 1.52 (H) 0.70 - 1.25 mg/dL Final   Creatinine, Ser  Date Value Ref Range Status  06/11/2020 1.38 (H) 0.76 - 1.27 mg/dL Final          Passed - Patient is not pregnant      Passed - Valid encounter within last 12 months    Recent Outpatient Visits           Today Acute midline low back pain with bilateral sciatica   [facility], Laurita Quint, FNP   1 month ago Essential hypertension, benign   [facility], Lilia Argue, MD   5 months ago Vesicular rash   [facility], Lilia Argue, MD   5 months ago Medicare annual wellness visit, subsequent   [facility], Lilia Argue, MD   10 months ago Psychophysiological insomnia   [facility], Lilia Argue, MD       Future Appointments             In 4 weeks Just, Laurita Quint, FNP Primary Care at [place], [facility]

## 2020-08-09 NOTE — Patient Instructions (Addendum)
Rest Ice/heat Tylenol as needed  Meloxicam daily: don't use Ibuprofen or aleve while taking this medication Should improve in ~4 weeks Muscle Relaxant at night    Acute Back Pain, Adult Acute back pain is sudden and usually short-lived. It is often caused by an injury to the muscles and tissues in the back. The injury may result from:  A muscle or ligament getting overstretched or torn (strained). Ligaments are tissues that connect bones to each other. Lifting something improperly can cause a back strain.  Wear and tear (degeneration) of the spinal disks. Spinal disks are circular tissue that provides cushioning between the bones of the spine (vertebrae).  Twisting motions, such as while playing sports or doing yard work.  A hit to the back.  Arthritis. You may have a physical exam, lab tests, and imaging tests to find the cause of your pain. Acute back pain usually goes away with rest and home care. Follow these instructions at home: Managing pain, stiffness, and swelling  Take over-the-counter and prescription medicines only as told by your health care provider.  Your health care provider may recommend applying ice during the first 24-48 hours after your pain starts. To do this: ? Put ice in a plastic bag. ? Place a towel between your skin and the bag. ? Leave the ice on for 20 minutes, 2-3 times a day.  If directed, apply heat to the affected area as often as told by your health care provider. Use the heat source that your health care provider recommends, such as a moist heat pack or a heating pad. ? Place a towel between your skin and the heat source. ? Leave the heat on for 20-30 minutes. ? Remove the heat if your skin turns bright red. This is especially important if you are unable to feel pain, heat, or cold. You have a greater risk of getting burned. Activity   Do not stay in bed. Staying in bed for more than 1-2 days can delay your recovery.  Sit up and stand up  straight. Avoid leaning forward when you sit, or hunching over when you stand. ? If you work at a desk, sit close to it so you do not need to lean over. Keep your chin tucked in. Keep your neck drawn back, and keep your elbows bent at a right angle. Your arms should look like the letter "L." ? Sit high and close to the steering wheel when you drive. Add lower back (lumbar) support to your car seat, if needed.  Take short walks on even surfaces as soon as you are able. Try to increase the length of time you walk each day.  Do not sit, drive, or stand in one place for more than 30 minutes at a time. Sitting or standing for long periods of time can put stress on your back.  Do not drive or use heavy machinery while taking prescription pain medicine.  Use proper lifting techniques. When you bend and lift, use positions that put less stress on your back: ? [place] your knees. ? Keep the load close to your body. ? Avoid twisting.  Exercise regularly as told by your health care provider. Exercising helps your back heal faster and helps prevent back injuries by keeping muscles strong and flexible.  Work with a physical therapist to make a safe exercise program, as recommended by your health care provider. Do any exercises as told by your physical therapist. Lifestyle  Maintain a healthy weight. Extra weight puts  stress on your back and makes it difficult to have good posture.  Avoid activities or situations that make you feel anxious or stressed. Stress and anxiety increase muscle tension and can make back pain worse. Learn ways to manage anxiety and stress, such as through exercise. General instructions  Sleep on a firm mattress in a comfortable position. Try lying on your side with your knees slightly bent. If you lie on your back, put a pillow under your knees.  Follow your treatment plan as told by your health care provider. This may include: ? Cognitive or behavioral therapy. ? Acupuncture or  massage therapy. ? Meditation or yoga. Contact a health care provider if:  You have pain that is not relieved with rest or medicine.  You have increasing pain going down into your legs or buttocks.  Your pain does not improve after 2 weeks.  You have pain at night.  You lose weight without trying.  You have a fever or chills. Get help right away if:  You develop new bowel or bladder control problems.  You have unusual weakness or numbness in your arms or legs.  You develop nausea or vomiting.  You develop abdominal pain.  You feel faint. Summary  Acute back pain is sudden and usually short-lived.  Use proper lifting techniques. When you bend and lift, use positions that put less stress on your back.  Take over-the-counter and prescription medicines and apply heat or ice as directed by your health care provider. This information is not intended to replace advice given to you by your health care provider. Make sure you discuss any questions you have with your health care provider. Document Revised: 01/04/2019 Document Reviewed: 04/29/2017 Elsevier Patient Education  [employer].  Back Exercises The following exercises strengthen the muscles that help to support the trunk and back. They also help to keep the lower back flexible. Doing these exercises can help to prevent back pain or lessen existing pain.  If you have back pain or discomfort, try doing these exercises 2-3 times each day or as told by your health care provider.  As your pain improves, do them once each day, but increase the number of times that you repeat the steps for each exercise (do more repetitions).  To prevent the recurrence of back pain, continue to do these exercises once each day or as told by your health care provider. Do exercises exactly as told by your health care provider and adjust them as directed. It is normal to feel mild stretching, pulling, tightness, or discomfort as you do these  exercises, but you should stop right away if you feel sudden pain or your pain gets worse. Exercises Single knee to chest Repeat these steps 3-5 times for each leg: 1. Lie on your back on a firm bed or the floor with your legs extended. 2. Bring one knee to your chest. Your other leg should stay extended and in contact with the floor. 3. Hold your knee in place by grabbing your knee or thigh with both hands and hold. 4. Pull on your knee until you feel a gentle stretch in your lower back or buttocks. 5. Hold the stretch for 10-30 seconds. 6. Slowly release and straighten your leg. Pelvic tilt Repeat these steps 5-10 times: 1. Lie on your back on a firm bed or the floor with your legs extended. 2. Bend your knees so they are pointing toward the ceiling and your feet are flat on the floor. 3.  Tighten your lower abdominal muscles to press your lower back against the floor. This motion will tilt your pelvis so your tailbone points up toward the ceiling instead of pointing to your feet or the floor. 4. With gentle tension and even breathing, hold this position for 5-10 seconds. Cat-cow Repeat these steps until your lower back becomes more flexible: 1. Get into a hands-and-knees position on a firm surface. Keep your hands under your shoulders, and keep your knees under your hips. You may place padding under your knees for comfort. 2. Let your head hang down toward your chest. Contract your abdominal muscles and point your tailbone toward the floor so your lower back becomes rounded like the back of a cat. 3. Hold this position for 5 seconds. 4. Slowly lift your head, let your abdominal muscles relax and point your tailbone up toward the ceiling so your back forms a sagging arch like the back of a cow. 5. Hold this position for 5 seconds.  Press-ups Repeat these steps 5-10 times: 1. Lie on your abdomen (face-down) on the floor. 2. Place your palms near your head, about shoulder-width  apart. 3. Keeping your back as relaxed as possible and keeping your hips on the floor, slowly straighten your arms to raise the top half of your body and lift your shoulders. Do not use your back muscles to raise your upper torso. You may adjust the placement of your hands to make yourself more comfortable. 4. Hold this position for 5 seconds while you keep your back relaxed. 5. Slowly return to lying flat on the floor.  Bridges Repeat these steps 10 times: 1. Lie on your back on a firm surface. 2. Bend your knees so they are pointing toward the ceiling and your feet are flat on the floor. Your arms should be flat at your sides, next to your body. 3. Tighten your buttocks muscles and lift your buttocks off the floor until your waist is at almost the same height as your knees. You should feel the muscles working in your buttocks and the back of your thighs. If you do not feel these muscles, slide your feet 1-2 inches farther away from your buttocks. 4. Hold this position for 3-5 seconds. 5. Slowly lower your hips to the starting position, and allow your buttocks muscles to relax completely. If this exercise is too easy, try doing it with your arms crossed over your chest. Abdominal crunches Repeat these steps 5-10 times: 1. Lie on your back on a firm bed or the floor with your legs extended. 2. Bend your knees so they are pointing toward the ceiling and your feet are flat on the floor. 3. Cross your arms over your chest. 4. Tip your chin slightly toward your chest without bending your neck. 5. Tighten your abdominal muscles and slowly raise your trunk (torso) high enough to lift your shoulder blades a tiny bit off the floor. Avoid raising your torso higher than that because it can put too much stress on your low back and does not help to strengthen your abdominal muscles. 6. Slowly return to your starting position. Back lifts Repeat these steps 5-10 times: 1. Lie on your abdomen (face-down)  with your arms at your sides, and rest your forehead on the floor. 2. Tighten the muscles in your legs and your buttocks. 3. Slowly lift your chest off the floor while you keep your hips pressed to the floor. Keep the back of your head in line with the curve  in your back. Your eyes should be looking at the floor. 4. Hold this position for 3-5 seconds. 5. Slowly return to your starting position. Contact a health care provider if:  Your back pain or discomfort gets much worse when you do an exercise.  Your worsening back pain or discomfort does not lessen within 2 hours after you exercise. If you have any of these problems, stop doing these exercises right away. Do not do them again unless your health care provider says that you can. Get help right away if:  You develop sudden, severe back pain. If this happens, stop doing the exercises right away. Do not do them again unless your health care provider says that you can. This information is not intended to replace advice given to you by your health care provider. Make sure you discuss any questions you have with your health care provider. Document Revised: 01/20/2019 Document Reviewed: 06/17/2018 Elsevier Patient Education  [employer].   If you have lab work done today you will be contacted with your lab results within the next 2 weeks.  If you have not heard from [place] then please contact us. The fastest way to get your results is to register for My Chart.   IF you received an x-ray today, you will receive an invoice from [facility]. Please contact [facility] at [phone] with questions or concerns regarding your invoice.   IF you received labwork today, you will receive an invoice from [place]. Please contact [facility] at [phone] with questions or concerns regarding your invoice.   Our billing staff will not be able to assist you with questions regarding bills from these companies.  You will be contacted  with the lab results as soon as they are available. The fastest way to get your results is to activate your My Chart account. Instructions are located on the last page of this paperwork. If you have not heard from [place] regarding the results in 2 weeks, please contact this office.

## 2020-08-22 ENCOUNTER — Ambulatory Visit: Payer: Medicare Other | Admitting: Podiatry

## 2020-08-22 ENCOUNTER — Telehealth: Payer: Self-pay | Admitting: *Deleted
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
## 2020-09-06 ENCOUNTER — Encounter: Payer: Self-pay | Admitting: Family Medicine

## 2020-09-06 ENCOUNTER — Ambulatory Visit (INDEPENDENT_AMBULATORY_CARE_PROVIDER_SITE_OTHER): Payer: Medicare Other | Admitting: Family Medicine

## 2020-09-06 ENCOUNTER — Other Ambulatory Visit: Payer: Self-pay

## 2020-09-06 VITALS — BP 130/77 | HR 55 | Temp 98.4°F | Resp 16 | Wt 173.0 lb

## 2020-09-06 DIAGNOSIS — I4729 Other ventricular tachycardia: Secondary | ICD-10-CM

## 2020-09-06 DIAGNOSIS — F319 Bipolar disorder, unspecified: Secondary | ICD-10-CM | POA: Diagnosis not present

## 2020-09-06 DIAGNOSIS — K860 Alcohol-induced chronic pancreatitis: Secondary | ICD-10-CM | POA: Diagnosis not present

## 2020-09-06 DIAGNOSIS — I472 Ventricular tachycardia: Secondary | ICD-10-CM | POA: Diagnosis not present

## 2020-09-06 DIAGNOSIS — M5441 Lumbago with sciatica, right side: Secondary | ICD-10-CM

## 2020-09-06 DIAGNOSIS — I739 Peripheral vascular disease, unspecified: Secondary | ICD-10-CM | POA: Diagnosis not present

## 2020-09-06 DIAGNOSIS — Z136 Encounter for screening for cardiovascular disorders: Secondary | ICD-10-CM

## 2020-09-06 DIAGNOSIS — I10 Essential (primary) hypertension: Secondary | ICD-10-CM | POA: Diagnosis not present

## 2020-09-06 DIAGNOSIS — Z122 Encounter for screening for malignant neoplasm of respiratory organs: Secondary | ICD-10-CM

## 2020-09-06 DIAGNOSIS — M5442 Lumbago with sciatica, left side: Secondary | ICD-10-CM

## 2020-09-06 MED ORDER — AMLODIPINE BESYLATE 10 MG PO TABS: ORAL_TABLET | ORAL | 3 refills | Status: DC

## 2020-09-06 MED ORDER — METOPROLOL SUCCINATE ER 25 MG PO TB24: ORAL_TABLET | ORAL | 3 refills | Status: DC

## 2020-09-06 MED ORDER — VERAPAMIL HCL 120 MG PO TABS: ORAL_TABLET | ORAL | 3 refills | Status: DC

## 2020-09-06 MED ORDER — MELOXICAM 15 MG PO TABS: 15.0000 mg | ORAL_TABLET | Freq: Every day | ORAL | 3 refills | Status: DC

## 2020-09-06 NOTE — Patient Instructions (Addendum)
Health Maintenance After Age 70 After age 81, you are at a higher risk for certain long-term diseases and infections as well as injuries from falls. Falls are a major cause of broken bones and head injuries in people who are older than age 86. Getting regular preventive care can help to keep you healthy and well. Preventive care includes getting regular testing and making lifestyle changes as recommended by your health care provider. Talk with your health care provider about:  Which screenings and tests you should have. A screening is a test that checks for a disease when you have no symptoms.  A diet and exercise plan that is right for you. What should I know about screenings and tests to prevent falls? Screening and testing are the best ways to find a health problem early. Early diagnosis and treatment give you the best chance of managing medical conditions that are common after age 40. Certain conditions and lifestyle choices may make you more likely to have a fall. Your health care provider may recommend:  Regular vision checks. Poor vision and conditions such as cataracts can make you more likely to have a fall. If you wear glasses, make sure to get your prescription updated if your vision changes.  Medicine review. Work with your health care provider to regularly review all of the medicines you are taking, including over-the-counter medicines. Ask your health care provider about any side effects that may make you more likely to have a fall. Tell your health care provider if any medicines that you take make you feel dizzy or sleepy.  Osteoporosis screening. Osteoporosis is a condition that causes the bones to get weaker. This can make the bones weak and cause them to break more easily.  Blood pressure screening. Blood pressure changes and medicines to control blood pressure can make you feel dizzy.  Strength and balance checks. Your health care provider may recommend certain tests to check  your strength and balance while standing, walking, or changing positions.  Foot health exam. Foot pain and numbness, as well as not wearing proper footwear, can make you more likely to have a fall.  Depression screening. You may be more likely to have a fall if you have a fear of falling, feel emotionally low, or feel unable to do activities that you used to do.  Alcohol use screening. Using too much alcohol can affect your balance and may make you more likely to have a fall. What actions can I take to lower my risk of falls? General instructions  Talk with your health care provider about your risks for falling. Tell your health care provider if: ? You fall. Be sure to tell your health care provider about all falls, even ones that seem minor. ? You feel dizzy, sleepy, or off-balance.  Take over-the-counter and prescription medicines only as told by your health care provider. These include any supplements.  Eat a healthy diet and maintain a healthy weight. A healthy diet includes low-fat dairy products, low-fat (lean) meats, and fiber from whole grains, beans, and lots of fruits and vegetables. Home safety  Remove any tripping hazards, such as rugs, cords, and clutter.  Install safety equipment such as grab bars in bathrooms and safety rails on stairs.  Keep rooms and walkways well-lit. Activity   Follow a regular exercise program to stay fit. This will help you maintain your balance. Ask your health care provider what types of exercise are appropriate for you.  If you need a cane  or walker, use it as recommended by your health care provider.  Wear supportive shoes that have nonskid soles. Lifestyle  Do not drink alcohol if your health care provider tells you not to drink.  If you drink alcohol, limit how much you have: ? 0-1 drink a day for women. ? 0-2 drinks a day for men.  Be aware of how much alcohol is in your drink. In the [place] one drink equals one typical bottle of  beer (12 oz), one-half glass of wine (5 oz), or one shot of hard liquor (1 oz).  Do not use any products that contain nicotine or tobacco, such as cigarettes and e-cigarettes. If you need help quitting, ask your health care provider. Summary  Having a healthy lifestyle and getting preventive care can help to protect your health and wellness after age 20.  Screening and testing are the best way to find a health problem early and help you avoid having a fall. Early diagnosis and treatment give you the best chance for managing medical conditions that are more common for people who are older than age 42.  Falls are a major cause of broken bones and head injuries in people who are older than age 84. Take precautions to prevent a fall at home.  Work with your health care provider to learn what changes you can make to improve your health and wellness and to prevent falls. This information is not intended to replace advice given to you by your health care provider. Make sure you discuss any questions you have with your health care provider. Document Revised: 01/06/2019 Document Reviewed: 07/29/2017 Elsevier Patient Education  [employer].   If you have lab work done today you will be contacted with your lab results within the next 2 weeks.  If you have not heard from [place] then please contact us. The fastest way to get your results is to register for My Chart.   IF you received an x-ray today, you will receive an invoice from [facility]. Please contact [facility] at [phone] with questions or concerns regarding your invoice.   IF you received labwork today, you will receive an invoice from [place]. Please contact [facility] at [phone] with questions or concerns regarding your invoice.   Our billing staff will not be able to assist you with questions regarding bills from these companies.  You will be contacted with the lab results as soon as they are available. The  fastest way to get your results is to activate your My Chart account. Instructions are located on the last page of this paperwork. If you have not heard from [place] regarding the results in 2 weeks, please contact this office.

## 2020-09-06 NOTE — Progress Notes (Signed)
12/9/20211:54 PM  Jaime Barnes Dec 01, 1950, 69 y.o., male [id number]  Chief Complaint  Patient presents with  . Hypertension    R shoulder and hand aches taking meloxicam need refills    . Tailbone Pain    Down back of both legs keeping him from living a healthy life/ unable to exercise now    HPI:   Patient is a 69 y.o. male with past medical history significant for HTN, PAD, HLP, CKD3, non obstructive CAD, barretts,alcoholiccirrhosis and migraineswho presents today for transfer of care.  HTN Goal< 130/80 amLODipine (NORVASC) 10 MG tablet verapamil (CALAN) 120 MG tablet BP Readings from Last 3 Encounters:  09/06/20 130/77  08/09/20 126/74  06/11/20 127/78   HLD Goal<100 Atorvastatin 80mg  Lab Results  Component Value Date   CHOL 116 06/11/2020   HDL 30 (L) 06/11/2020   LDLCALC 60 06/11/2020   LDLDIRECT 156 (H) 06/09/2011   TRIG 152 (H) 06/11/2020   CHOLHDL 3.9 06/11/2020    GERD Omeprazole 20 mg bid   Stage 3a chronic kidney disease   Followed by nephrology   Has not smoked in 2 days Is working on quitting  Screening Cologuard 2021 negative Flu: up to date Shingles: will do Covid done Lung cancer screening: ordered AAA: ordered  Dentist: January for teeth removal x2   Depression screen [facility] 2/9 09/06/2020 08/09/2020 03/12/2020  Decreased Interest 0 0 0  Down, Depressed, Hopeless 0 0 0  PHQ - 2 Score 0 0 0  Altered sleeping - - -  Tired, decreased energy - - -  Change in appetite - - -  Feeling bad or failure about yourself  - - -  Trouble concentrating - - -  Moving slowly or fidgety/restless - - -  Suicidal thoughts - - -  PHQ-9 Score - - -  Difficult doing work/chores - - -  Some recent data might be hidden    Fall Risk  09/06/2020 08/09/2020 03/12/2020 02/20/2020 09/20/2019  Falls in the past year? 0 0 0 1 0  Number falls in past yr: 0 0 - 0 0  Comment - - - slipped on grass / no injury -  Injury with Fall? 0 0 - 0 0  Follow up Falls  evaluation completed Falls evaluation completed Falls evaluation completed Falls evaluation completed;Education provided -     Allergies  Allergen Reactions  . Codeine Phosphate Other (See Comments)    REACTION: breathing difficulty  . Penicillins Rash and Other (See Comments)    Break out in sweats/ Rash amoxicillin. Denies airway involvement Has patient had a PCN reaction causing immediate rash, facial/tongue/throat swelling, SOB or lightheadedness with hypotension: Yes Has patient had a PCN reaction causing severe rash involving mucus membranes or skin necrosis: Yes Has patient had a PCN reaction that required hospitalization No Has patient had a PCN reaction occurring within the last 10 years: No If all of the above answers are "NO", then may proceed with Cephalosporin u    Prior to Admission medications   Medication Sig Start Date End Date Taking? Authorizing Provider  albuterol (VENTOLIN HFA) 108 (90 Base) MCG/ACT inhaler INHALE 2 PUFFS INTO THE LUNGS EVERY 6 HOURS AS NEEDED FOR WHEEZING OR SHORTNESS OF BREATH 05/14/20  Yes Rutherford Guys, MD  amLODipine (NORVASC) 10 MG tablet TAKE 1 TABLET(10 MG) BY MOUTH DAILY 06/11/20  Yes Rutherford Guys, MD  aspirin 81 MG chewable tablet Chew 81 mg by mouth daily.   Yes [provider]  atorvastatin (  LIPITOR) 80 MG tablet Take 1 tablet (80 mg total) by mouth daily at 6 PM. TAKE 1 TABLET(40 MG) BY MOUTH DAILY 03/12/20  Yes Rutherford Guys, MD  clindamycin (CLEOCIN) 300 MG capsule Take 1 capsule (300 mg total) by mouth 3 (three) times daily. 06/11/20  Yes Rutherford Guys, MD  gabapentin (NEURONTIN) 300 MG capsule TAKE 2 CAPSULES(600 MG) BY MOUTH THREE TIMES DAILY 03/12/20  Yes Rutherford Guys, MD  HYDROcodone-acetaminophen (NORCO/VICODIN) 5-325 MG tablet TK 1 T PO QID 04/25/19  Yes [provider]  ibuprofen (ADVIL) 800 MG tablet  04/13/19  Yes [provider]  MAGNESIUM-OXIDE 400 (241.3 Mg) MG tablet TAKE 1 TABLET BY MOUTH  EVERY DAY 04/10/20  Yes Skeet Latch, MD  meclizine (ANTIVERT) 12.5 MG tablet TAKE 1 TABLET BY MOUTH THREE TIMES DAILY AS NEEDED FOR DIZZINESS 12/30/17  Yes Sherene Sires, DO  meloxicam (MOBIC) 15 MG tablet Take 1 tablet (15 mg total) by mouth daily. 08/09/20  Yes Ladarrell Cornwall, Laurita Quint, FNP  methocarbamol (ROBAXIN) 750 MG tablet TAKE 1 TABLET(750 MG) BY MOUTH TWICE DAILY AS NEEDED FOR MUSCLE SPASMS 06/07/20  Yes Leandrew Koyanagi, MD  metoprolol succinate (TOPROL-XL) 25 MG 24 hr tablet TAKE 1 TABLET(25 MG) BY MOUTH DAILY 06/09/20  Yes Rutherford Guys, MD  omeprazole (PRILOSEC) 20 MG capsule Take 1 capsule (20 mg total) by mouth 2 (two) times daily before a meal. 06/11/20  Yes Rutherford Guys, MD  sildenafil (VIAGRA) 100 MG tablet TAKE 1/2 (ONE-HALF) TABLET BY MOUTH ONCE DAILY AS NEEDED FOR  ERECTILE  DYSFUNCTION 09/20/19  Yes Rutherford Guys, MD  tiZANidine (ZANAFLEX) 4 MG tablet TAKE 1 TABLET(4 MG) BY MOUTH AT BEDTIME AS NEEDED FOR MUSCLE SPASMS 04/10/20  Yes Rutherford Guys, MD  topiramate (TOPAMAX) 100 MG tablet One tab by mouth twice daily 04/23/20  Yes Rutherford Guys, MD  traZODone (DESYREL) 100 MG tablet TAKE 1 TABLET(100 MG) BY MOUTH AT BEDTIME 06/08/20  Yes Pamella Pert, Irma M, MD  Ubrogepant (UBRELVY) 100 MG TABS TAKE 1 TABLET(100 MG) BY MOUTH 1 TIME EVERY 2 HOURS AS DIRECTED AS NEEDED. MAXIMUM 2 TABLETS IN 24 HOURS 06/11/20  Yes Jaffe, Adam R, DO  valACYclovir (VALTREX) 500 MG tablet Take 1 tablet (500 mg total) by mouth 2 (two) times daily. 06/11/20  Yes Rutherford Guys, MD  verapamil (CALAN) 120 MG tablet TAKE 1 TABLET(120 MG) BY MOUTH EVERY MORNING 06/11/20  Yes Rutherford Guys, MD    Past Medical History:  Diagnosis Date  . Barrett's esophagus 2011   Recommend repeat EGD for surveillance in 2 yrs-[facility]  . Bipolar disorder ([place])   . Chronic kidney disease   . Cirrhosis ([place])   . Diverticulosis of colon   . GANGLION CYST, TENDON SHEATH 06/26/2010  . Hyperlipidemia   . Hypertension   .  Hypertriglyceridemia 01/21/2019  . Impotence   . Migraine   . Pancreatitis   . Pulmonary nodule   . Pulmonary nodule seen on imaging study 02/06/2011   Followup CT in December 2014 with 20 months of stability at 4 mm.  No further imaging recommended     Past Surgical History:  Procedure Laterality Date  . CARDIAC CATHETERIZATION  2005   Nonobstructive, <40%  . TONSILLECTOMY Bilateral 1961    Social History   Tobacco Use  . Smoking status: Light Tobacco Smoker    Packs/day: 0.25    Years: 53.00    Pack years: 13.25  Types: Cigars    Start date: 09/29/1962    Last attempt to quit: 08/04/2016    Years since quitting: 4.0  . Smokeless tobacco: Former Systems developer    Quit date: 03/08/2017  . Tobacco comment: cigar occassionally   Substance Use Topics  . Alcohol use: No    Comment: Last drink "30 years ago"    Family History  Problem Relation Age of Onset  . Anemia Mother   . Hypertension Daughter   . Diabetes Daughter   . Migraines Daughter     Review of Systems  Constitutional: Negative for chills, fever and malaise/fatigue.  HENT: Negative for hearing loss and tinnitus.   Eyes: Negative for blurred vision and double vision.  Respiratory: Negative for cough, shortness of breath and wheezing.   Cardiovascular: Negative for chest pain, palpitations and leg swelling.  Gastrointestinal: Positive for heartburn. Negative for abdominal pain, blood in stool, constipation, diarrhea, nausea and vomiting.  Genitourinary: Negative for dysuria, frequency and hematuria.  Musculoskeletal: Positive for back pain. Negative for joint pain.  Skin: Negative for rash.  Neurological: Positive for dizziness. Negative for tingling, weakness and headaches.     OBJECTIVE:  Today's Vitals   09/06/20 1354  BP: 130/77  Pulse: (!) 55  Resp: 16  Temp: 98.4 F (36.9 C)  SpO2: 97%  Weight: 173 lb (78.5 kg)   Body mass index is 27.92 kg/m.   Physical Exam Vitals reviewed.  Constitutional:       Appearance: Normal appearance.  HENT:     Head: Normocephalic and atraumatic.  Eyes:     Conjunctiva/sclera: Conjunctivae normal.     Pupils: Pupils are equal, round, and reactive to light.  Cardiovascular:     Rate and Rhythm: Normal rate and regular rhythm.     Pulses: Normal pulses.     Heart sounds: Normal heart sounds. No murmur heard. No friction rub. No gallop.   Pulmonary:     Effort: Pulmonary effort is normal. No respiratory distress.     Breath sounds: Normal breath sounds. No stridor. No wheezing or rales.  Abdominal:     General: Bowel sounds are normal.     Palpations: Abdomen is soft.     Tenderness: There is no abdominal tenderness.  Musculoskeletal:     Right lower leg: No edema.     Left lower leg: No edema.  Skin:    General: Skin is warm and dry.  Neurological:     General: No focal deficit present.     Mental Status: He is alert and oriented to person, place, and time.  Psychiatric:        Mood and Affect: Mood normal.        Behavior: Behavior normal.     No results found for this or any previous visit (from the past 24 hour(s)).  No results found.   ASSESSMENT and PLAN  Problem List Items Addressed This Visit      Cardiovascular and Mediastinum   HYPERTENSION, BENIGN SYSTEMIC  PAD (peripheral artery disease) (HCC) - Primary NSVT (nonsustained ventricular tachycardia) (HCC)     Relevant Medications   amLODipine (NORVASC) 10 MG tablet   verapamil (CALAN) 120 MG tablet   metoprolol succinate (TOPROL-XL) 25 MG 24 hr tablet  Daily atorvastatin 80mg  LDL goal< 80  At goal< 140/90      Digestive   PANCREATITIS, CHRONIC     Other   Back pain   Relevant Medications   meloxicam (MOBIC) 15 MG tablet  Other Visit Diagnoses    Bipolar I disorder ([place])   (Chronic)     Encounter for screening for lung cancer       Relevant Orders   CT CHEST LUNG CA SCREEN LOW DOSE W/O CM   Encounter for abdominal aortic aneurysm (AAA) screening        Relevant Orders   VAS [place] AAA DUPLEX      Return in about 3 months (around 12/05/2020).   Huston Foley Roberta Angell, FNP-BC [facility] [place], [place] [address] Ph.  [phone] Fax [phone]

## 2020-09-10 ENCOUNTER — Other Ambulatory Visit (HOSPITAL_COMMUNITY): Payer: Medicare Other

## 2020-09-13 ENCOUNTER — Ambulatory Visit (HOSPITAL_COMMUNITY)
Admission: RE | Admit: 2020-09-13 | Discharge: 2020-09-13 | Disposition: A | Discharge status: Home / Self Care | Payer: Medicare Other | Source: Ambulatory Visit | Attending: Family Medicine | Admitting: Family Medicine

## 2020-09-13 ENCOUNTER — Other Ambulatory Visit: Payer: Self-pay

## 2020-09-13 DIAGNOSIS — Z136 Encounter for screening for cardiovascular disorders: Secondary | ICD-10-CM | POA: Insufficient documentation

## 2020-09-13 NOTE — Progress Notes (Signed)
If you could let Jaime Barnes know no evidence of a AAA was found on his screening test. He does have stenosis in his arteries which is known and he sees Cardiology for his PAD. No changes in current medications.

## 2020-09-27 ENCOUNTER — Ambulatory Visit
Admission: RE | Admit: 2020-09-27 | Discharge: 2020-09-27 | Disposition: A | Discharge status: Home / Self Care | Payer: Medicare Other | Source: Ambulatory Visit | Attending: Family Medicine | Admitting: Family Medicine

## 2020-09-27 ENCOUNTER — Other Ambulatory Visit: Payer: Self-pay

## 2020-09-27 DIAGNOSIS — J432 Centrilobular emphysema: Secondary | ICD-10-CM | POA: Diagnosis not present

## 2020-09-27 DIAGNOSIS — K7460 Unspecified cirrhosis of liver: Secondary | ICD-10-CM | POA: Diagnosis not present

## 2020-09-27 DIAGNOSIS — I2510 Atherosclerotic heart disease of native coronary artery without angina pectoris: Secondary | ICD-10-CM | POA: Diagnosis not present

## 2020-09-27 DIAGNOSIS — F17210 Nicotine dependence, cigarettes, uncomplicated: Secondary | ICD-10-CM | POA: Diagnosis not present

## 2020-09-27 DIAGNOSIS — Z122 Encounter for screening for malignant neoplasm of respiratory organs: Secondary | ICD-10-CM

## 2020-09-27 NOTE — Progress Notes (Signed)
If you could let Jaime Barnes know his CT showed no signs of lung cancer, will re-screen in 1 year.

## 2020-10-03 ENCOUNTER — Other Ambulatory Visit: Payer: Self-pay | Admitting: Family Medicine

## 2020-10-03 MED ORDER — SILDENAFIL CITRATE 100 MG PO TABS
ORAL_TABLET | ORAL | 2 refills | Status: DC
Start: 2020-10-03 — End: 2020-10-05

## 2020-10-03 NOTE — Telephone Encounter (Signed)
Medication Refill - Medication: sildenafil (VIAGRA) 100 MG tablet     Preferred Pharmacy (with phone number or street name):  [facility] - [place] ([place]), [place] - [address] Phone:  [phone]  Fax:  [phone]       Agent: Please be advised that RX refills may take up to 3 business days. We ask that you follow-up with your pharmacy.

## 2020-10-05 ENCOUNTER — Other Ambulatory Visit: Payer: Self-pay | Admitting: Family Medicine

## 2020-10-05 MED ORDER — SILDENAFIL CITRATE 100 MG PO TABS
ORAL_TABLET | ORAL | 2 refills | Status: DC
Start: 2020-10-05 — End: 2021-07-01

## 2020-10-05 NOTE — Telephone Encounter (Signed)
Medication Refill - Medication: sildenafil (VIAGRA) 100 MG tablet (Patient stated medication was sent to the wrong pharmacy and he needs medication sent to [facility])   Has the patient contacted their pharmacy? yes (Agent: If no, request that the patient contact the pharmacy for the refill.) (Agent: If yes, when and what did the pharmacy advise?)Contact PCP  Preferred Pharmacy (with phone number or street name): [place] ([place]), [place] - [address] Phone:  [phone]  Fax:  [phone]       Agent: Please be advised that RX refills may take up to 3 business days. We ask that you follow-up with your pharmacy.

## 2020-10-17 ENCOUNTER — Other Ambulatory Visit: Payer: Self-pay

## 2020-10-17 ENCOUNTER — Encounter: Payer: Self-pay | Admitting: Podiatry

## 2020-10-17 ENCOUNTER — Ambulatory Visit (INDEPENDENT_AMBULATORY_CARE_PROVIDER_SITE_OTHER): Payer: 59 | Admitting: Podiatry

## 2020-10-17 DIAGNOSIS — N183 Chronic kidney disease, stage 3 unspecified: Secondary | ICD-10-CM

## 2020-10-17 DIAGNOSIS — B351 Tinea unguium: Secondary | ICD-10-CM | POA: Diagnosis not present

## 2020-10-17 DIAGNOSIS — M79675 Pain in left toe(s): Secondary | ICD-10-CM | POA: Diagnosis not present

## 2020-10-17 DIAGNOSIS — M79674 Pain in right toe(s): Secondary | ICD-10-CM | POA: Diagnosis not present

## 2020-10-17 NOTE — Progress Notes (Signed)
This patient returns to my office for at risk foot care.  This patient requires this care by a professional since this patient will be at risk due to having chronic kidney disease  This patient is unable to cut nails himself since the patient cannot reach his nails.These nails are painful walking and wearing shoes.  This patient presents for at risk foot care today.  General Appearance  Alert, conversant and in no acute stress.  Vascular  Dorsalis pedis and posterior tibial  pulses are palpable  bilaterally.  Capillary return is within normal limits  bilaterally. Temperature is within normal limits  bilaterally.  Neurologic  Senn-Weinstein monofilament wire test within normal limits  bilaterally. Muscle power within normal limits bilaterally.  Nails Thick disfigured discolored nails with subungual debris  from hallux to fifth toes bilaterally. No evidence of bacterial infection or drainage bilaterally.  Orthopedic  No limitations of motion  feet .  No crepitus or effusions noted.  No bony pathology or digital deformities noted.  Skin  normotropic skin with no porokeratosis noted bilaterally.  No signs of infections or ulcers noted.     Onychomycosis  Pain in right toes  Pain in left toes  Consent was obtained for treatment procedures.   Mechanical debridement of nails 1-5  bilaterally performed with a nail nipper.  Filed with dremel without incident.    Return office visit   10 weeks                  Told patient to return for periodic foot care and evaluation due to potential at risk complications.   Gardiner Barefoot DPM

## 2020-10-18 ENCOUNTER — Other Ambulatory Visit: Payer: Self-pay | Admitting: Gastroenterology

## 2020-10-18 DIAGNOSIS — K7030 Alcoholic cirrhosis of liver without ascites: Secondary | ICD-10-CM

## 2020-11-01 ENCOUNTER — Ambulatory Visit
Admission: RE | Admit: 2020-11-01 | Discharge: 2020-11-01 | Disposition: A | Discharge status: Home / Self Care | Payer: 59 | Source: Ambulatory Visit | Attending: Gastroenterology | Admitting: Gastroenterology

## 2020-11-01 DIAGNOSIS — K7030 Alcoholic cirrhosis of liver without ascites: Secondary | ICD-10-CM
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
## 2020-11-05 ENCOUNTER — Ambulatory Visit (INDEPENDENT_AMBULATORY_CARE_PROVIDER_SITE_OTHER): Payer: 59 | Admitting: Neurology

## 2020-11-05 ENCOUNTER — Other Ambulatory Visit: Payer: Self-pay

## 2020-11-05 ENCOUNTER — Encounter: Payer: Self-pay | Admitting: Neurology

## 2020-11-05 VITALS — BP 135/74 | HR 79 | Ht 66.0 in | Wt 173.8 lb

## 2020-11-05 DIAGNOSIS — G43009 Migraine without aura, not intractable, without status migrainosus: Secondary | ICD-10-CM

## 2020-11-05 MED ORDER — UBRELVY 100 MG PO TABS: ORAL_TABLET | ORAL | 5 refills | Status: DC

## 2020-11-05 NOTE — Patient Instructions (Signed)
1.  Go home and let us know what the dose of your topiramate is 2.  Refilled Ubrelvy 3.  Talk to your PCP about the dizziness 4.  Follow up one year

## 2020-11-09 ENCOUNTER — Other Ambulatory Visit: Payer: Self-pay | Admitting: Cardiovascular Disease

## 2020-12-03 ENCOUNTER — Other Ambulatory Visit: Payer: Self-pay

## 2020-12-03 ENCOUNTER — Ambulatory Visit (INDEPENDENT_AMBULATORY_CARE_PROVIDER_SITE_OTHER): Payer: 59 | Admitting: Family Medicine

## 2020-12-03 ENCOUNTER — Encounter: Payer: Self-pay | Admitting: Family Medicine

## 2020-12-03 VITALS — BP 112/71 | HR 66 | Temp 98.0°F | Ht 66.0 in | Wt 171.0 lb

## 2020-12-03 DIAGNOSIS — Z125 Encounter for screening for malignant neoplasm of prostate: Secondary | ICD-10-CM

## 2020-12-03 DIAGNOSIS — E785 Hyperlipidemia, unspecified: Secondary | ICD-10-CM

## 2020-12-03 DIAGNOSIS — F5104 Psychophysiologic insomnia: Secondary | ICD-10-CM

## 2020-12-03 DIAGNOSIS — M545 Low back pain, unspecified: Secondary | ICD-10-CM

## 2020-12-03 DIAGNOSIS — M5442 Lumbago with sciatica, left side: Secondary | ICD-10-CM

## 2020-12-03 DIAGNOSIS — I10 Essential (primary) hypertension: Secondary | ICD-10-CM | POA: Diagnosis not present

## 2020-12-03 DIAGNOSIS — R7303 Prediabetes: Secondary | ICD-10-CM | POA: Diagnosis not present

## 2020-12-03 DIAGNOSIS — M5441 Lumbago with sciatica, right side: Secondary | ICD-10-CM

## 2020-12-03 MED ORDER — TRAZODONE HCL 100 MG PO TABS: 50.0000 mg | ORAL_TABLET | Freq: Every day | ORAL | 1 refills | Status: DC

## 2020-12-03 MED ORDER — METHOCARBAMOL 750 MG PO TABS
ORAL_TABLET | ORAL | 1 refills | Status: DC
Start: 2020-12-03 — End: 2021-02-27

## 2020-12-03 MED ORDER — MELOXICAM 15 MG PO TABS: 15.0000 mg | ORAL_TABLET | Freq: Every day | ORAL | 3 refills | Status: DC

## 2020-12-03 NOTE — Progress Notes (Signed)
3/7/20222:23 PM  Jaime Barnes 04/16/1951, 70 y.o., male [id number]  Chief Complaint  Patient presents with  . Back Pain    Radiates down buttocks and back of thighs   . Medication Refill    Trazadone    HPI:   Patient is a 70 y.o. male with past medical history significant for HTN, PAD, HLP, CKD3, non obstructive CAD, barretts,alcoholiccirrhosis and migraineswho presents today for chronic care follow up.  HTN Goal< 130/80 amLODipine (NORVASC) 10 MG tablet verapamil (CALAN) 120 MG tablet Metoprolol 76m daily Lisinopril/HCTZ 10/12.5 mg daily BP Readings from Last 3 Encounters:  12/03/20 112/71  11/05/20 135/74  09/06/20 130/77   HLD Goal<100 Atorvastatin 838mLab Results  Component Value Date   CHOL 116 06/11/2020   HDL 30 (L) 06/11/2020   LDLCALC 60 06/11/2020   LDLDIRECT 156 (H) 06/09/2011   TRIG 152 (H) 06/11/2020   CHOLHDL 3.9 06/11/2020     GERD Omeprazole 20 mg bid   Stage 3a chronic kidney disease   Followed by nephrology  Back pain Was previously on Meloxicam and Methocarbamol Has run out of both of these medications  In the past sent to orthopedic surgery Then sent to PT For the most part can do daily activities Intermittent pain Doesn't take anything for the pain at home  Smokes cigars occasionally Doesn't drink  Screening Cologuard 2021 negative PSA: today Shingles: needs Lung cancer screening: 09/27/20: follow up in 1 year AAA: 09/13/20: no AAA found Dentist: January for teeth removal x2 Health Maintenance  Topic Date Due  . COVID-19 Vaccine (3 - Booster for Pfizer series) 07/24/2020  . TETANUS/TDAP  06/08/2021  . Fecal DNA (Cologuard)  03/30/2023  . INFLUENZA VACCINE  Completed  . Hepatitis C Screening  Completed  . PNA vac Low Risk Adult  Completed  . HPV VACCINES  Aged Out      [facility] 12/03/2020 09/06/2020 08/09/2020  Decreased Interest 0 0 0  Down, Depressed, Hopeless 0 0 0  PHQ - 2 Score 0 0 0   Altered sleeping - - -  Tired, decreased energy - - -  Change in appetite - - -  Feeling bad or failure about yourself  - - -  Trouble concentrating - - -  Moving slowly or fidgety/restless - - -  Suicidal thoughts - - -  PHQ-9 Score - - -  Difficult doing work/chores - - -  Some recent data might be hidden    Fall Risk  12/03/2020 11/05/2020 09/06/2020 08/09/2020 03/12/2020  Falls in the past year? 0 0 0 0 0  Number falls in past yr: 0 0 0 0 -  Comment - - - - -  Injury with Fall? 0 0 0 0 -  Follow up Falls evaluation completed - Falls evaluation completed Falls evaluation completed Falls evaluation completed     Allergies  Allergen Reactions  . Other     Other reaction(s): rash, sweat, nausea Other reaction(s): rash, sweat, nausea  . Codeine Phosphate Other (See Comments)    REACTION: breathing difficulty  . Penicillins Rash and Other (See Comments)    Break out in sweats/ Rash amoxicillin. Denies airway involvement Has patient had a PCN reaction causing immediate rash, facial/tongue/throat swelling, SOB or lightheadedness with hypotension: Yes Has patient had a PCN reaction causing severe rash involving mucus membranes or skin necrosis: Yes Has patient had a PCN reaction that required hospitalization No Has patient had a PCN reaction occurring within the last 10  years: No If all of the above answers are "NO", then may proceed with Cephalosporin u    Prior to Admission medications   Medication Sig Start Date End Date Taking? Authorizing Provider  albuterol (VENTOLIN HFA) 108 (90 Base) MCG/ACT inhaler INHALE 2 PUFFS INTO THE LUNGS EVERY 6 HOURS AS NEEDED FOR WHEEZING OR SHORTNESS OF BREATH 05/14/20  Yes Rutherford Guys, MD  amLODipine (NORVASC) 10 MG tablet TAKE 1 TABLET(10 MG) BY MOUTH DAILY 06/11/20  Yes Rutherford Guys, MD  aspirin 81 MG chewable tablet Chew 81 mg by mouth daily.   Yes [provider]  atorvastatin (LIPITOR) 80 MG tablet Take 1 tablet (80 mg total) by  mouth daily at 6 PM. TAKE 1 TABLET(40 MG) BY MOUTH DAILY 03/12/20  Yes Rutherford Guys, MD  clindamycin (CLEOCIN) 300 MG capsule Take 1 capsule (300 mg total) by mouth 3 (three) times daily. 06/11/20  Yes Rutherford Guys, MD  gabapentin (NEURONTIN) 300 MG capsule TAKE 2 CAPSULES(600 MG) BY MOUTH THREE TIMES DAILY 03/12/20  Yes Rutherford Guys, MD  HYDROcodone-acetaminophen (NORCO/VICODIN) 5-325 MG tablet TK 1 T PO QID 04/25/19  Yes [provider]  ibuprofen (ADVIL) 800 MG tablet  04/13/19  Yes [provider]  MAGNESIUM-OXIDE 400 (241.3 Mg) MG tablet TAKE 1 TABLET BY MOUTH EVERY DAY 04/10/20  Yes Skeet Latch, MD  meclizine (ANTIVERT) 12.5 MG tablet TAKE 1 TABLET BY MOUTH THREE TIMES DAILY AS NEEDED FOR DIZZINESS 12/30/17  Yes Sherene Sires, DO  meloxicam (MOBIC) 15 MG tablet Take 1 tablet (15 mg total) by mouth daily. 08/09/20  Yes Khori Underberg, Laurita Quint, FNP  methocarbamol (ROBAXIN) 750 MG tablet TAKE 1 TABLET(750 MG) BY MOUTH TWICE DAILY AS NEEDED FOR MUSCLE SPASMS 06/07/20  Yes Leandrew Koyanagi, MD  metoprolol succinate (TOPROL-XL) 25 MG 24 hr tablet TAKE 1 TABLET(25 MG) BY MOUTH DAILY 06/09/20  Yes Rutherford Guys, MD  omeprazole (PRILOSEC) 20 MG capsule Take 1 capsule (20 mg total) by mouth 2 (two) times daily before a meal. 06/11/20  Yes Rutherford Guys, MD  sildenafil (VIAGRA) 100 MG tablet TAKE 1/2 (ONE-HALF) TABLET BY MOUTH ONCE DAILY AS NEEDED FOR  ERECTILE  DYSFUNCTION 09/20/19  Yes Rutherford Guys, MD  tiZANidine (ZANAFLEX) 4 MG tablet TAKE 1 TABLET(4 MG) BY MOUTH AT BEDTIME AS NEEDED FOR MUSCLE SPASMS 04/10/20  Yes Rutherford Guys, MD  topiramate (TOPAMAX) 100 MG tablet One tab by mouth twice daily 04/23/20  Yes Rutherford Guys, MD  traZODone (DESYREL) 100 MG tablet TAKE 1 TABLET(100 MG) BY MOUTH AT BEDTIME 06/08/20  Yes Pamella Pert, Irma M, MD  Ubrogepant (UBRELVY) 100 MG TABS TAKE 1 TABLET(100 MG) BY MOUTH 1 TIME EVERY 2 HOURS AS DIRECTED AS NEEDED. MAXIMUM 2 TABLETS IN 24 HOURS  06/11/20  Yes Jaffe, Adam R, DO  valACYclovir (VALTREX) 500 MG tablet Take 1 tablet (500 mg total) by mouth 2 (two) times daily. 06/11/20  Yes Rutherford Guys, MD  verapamil (CALAN) 120 MG tablet TAKE 1 TABLET(120 MG) BY MOUTH EVERY MORNING 06/11/20  Yes Rutherford Guys, MD    Past Medical History:  Diagnosis Date  . Barrett's esophagus 2011   Recommend repeat EGD for surveillance in 2 yrs-[facility]  . Bipolar disorder ([place])   . Chronic kidney disease   . Cirrhosis ([place])   . Diverticulosis of colon   . GANGLION CYST, TENDON SHEATH 06/26/2010  . Hyperlipidemia   . Hypertension   . Hypertriglyceridemia 01/21/2019  .  Impotence   . Migraine   . Pancreatitis   . Pulmonary nodule   . Pulmonary nodule seen on imaging study 02/06/2011   Followup CT in December 2014 with 20 months of stability at 4 mm.  No further imaging recommended     Past Surgical History:  Procedure Laterality Date  . CARDIAC CATHETERIZATION  2005   Nonobstructive, <40%  . TONSILLECTOMY Bilateral 1961    Social History   Tobacco Use  . Smoking status: Light Tobacco Smoker    Packs/day: 0.25    Years: 53.00    Pack years: 13.25    Types: Cigars    Start date: 09/29/1962    Last attempt to quit: 08/04/2016    Years since quitting: 4.3  . Smokeless tobacco: Former Systems developer    Quit date: 03/08/2017  . Tobacco comment: cigar occassionally   Substance Use Topics  . Alcohol use: No    Comment: Last drink "30 years ago"    Family History  Problem Relation Age of Onset  . Anemia Mother   . Hypertension Daughter   . Diabetes Daughter   . Migraines Daughter     Review of Systems  Constitutional: Negative for chills, fever and malaise/fatigue.  Respiratory: Negative for cough, shortness of breath and wheezing.   Cardiovascular: Negative for chest pain, palpitations and leg swelling.  Gastrointestinal: Negative for abdominal pain, constipation, diarrhea, heartburn, nausea and vomiting.  Musculoskeletal: Positive for  back pain. Negative for falls, joint pain and neck pain.  Skin: Negative for rash.  Neurological: Negative for dizziness, tingling, sensory change, focal weakness, weakness and headaches.     OBJECTIVE:  Today's Vitals   12/03/20 1406  BP: 112/71  Pulse: 66  Temp: 98 F (36.7 C)  SpO2: 97%  Weight: 171 lb (77.6 kg)  Height: _0  (1.676 m)   Body mass index is 27.6 kg/m.   Physical Exam Vitals reviewed.  Constitutional:      Appearance: Normal appearance.  HENT:     Head: Normocephalic and atraumatic.  Eyes:     Conjunctiva/sclera: Conjunctivae normal.     Pupils: Pupils are equal, round, and reactive to light.  Cardiovascular:     Rate and Rhythm: Normal rate and regular rhythm.     Pulses: Normal pulses.     Heart sounds: Normal heart sounds. No murmur heard. No friction rub. No gallop.   Pulmonary:     Effort: Pulmonary effort is normal. No respiratory distress.     Breath sounds: Normal breath sounds. No stridor. No wheezing or rales.  Abdominal:     General: Bowel sounds are normal.     Palpations: Abdomen is soft.     Tenderness: There is no abdominal tenderness.  Musculoskeletal:     Thoracic back: Normal.     Lumbar back: Normal.     Right lower leg: No edema.     Left lower leg: No edema.  Skin:    General: Skin is warm and dry.  Neurological:     General: No focal deficit present.     Mental Status: He is alert and oriented to person, place, and time.  Psychiatric:        Mood and Affect: Mood normal.        Behavior: Behavior normal.     No results found for this or any previous visit (from the past 24 hour(s)).  No results found.   ASSESSMENT and PLAN  Problem List Items Addressed This Visit  Cardiovascular and Mediastinum   HYPERTENSION, BENIGN SYSTEMIC - Primary   Relevant Orders   CMP14+EGFR     Other   Hyperlipidemia LDL goal <70   Relevant Orders   Lipid Panel   Insomnia   Relevant Medications   traZODone (DESYREL)  100 MG tablet   Back pain   Relevant Medications   methocarbamol (ROBAXIN) 750 MG tablet   meloxicam (MOBIC) 15 MG tablet   Other Relevant Orders   Ambulatory referral to Physical Therapy    Other Visit Diagnoses    Pre-diabetes       Relevant Orders   Hemoglobin A1c   Screening for prostate cancer       Relevant Orders   PSA        Referral to PT placed for back pain Medication refills sent Will follow up with lab results  Return in about 3 months (around 03/05/2021).   Huston Foley Meili Kleckley, FNP-BC [facility] [place], [place] [address] Ph.  [phone] Fax [phone]

## 2020-12-03 NOTE — Patient Instructions (Addendum)
  Health Maintenance After Age 71 After age 71, you are at a higher risk for certain long-term diseases and infections as well as injuries from falls. Falls are a major cause of broken bones and head injuries in people who are older than age 71. Getting regular preventive care can help to keep you healthy and well. Preventive care includes getting regular testing and making lifestyle changes as recommended by your health care provider. Talk with your health care provider about:  Which screenings and tests you should have. A screening is a test that checks for a disease when you have no symptoms.  A diet and exercise plan that is right for you. What should I know about screenings and tests to prevent falls? Screening and testing are the best ways to find a health problem early. Early diagnosis and treatment give you the best chance of managing medical conditions that are common after age 71. Certain conditions and lifestyle choices may make you more likely to have a fall. Your health care provider may recommend:  Regular vision checks. Poor vision and conditions such as cataracts can make you more likely to have a fall. If you wear glasses, make sure to get your prescription updated if your vision changes.  Medicine review. Work with your health care provider to regularly review all of the medicines you are taking, including over-the-counter medicines. Ask your health care provider about any side effects that may make you more likely to have a fall. Tell your health care provider if any medicines that you take make you feel dizzy or sleepy.  Osteoporosis screening. Osteoporosis is a condition that causes the bones to get weaker. This can make the bones weak and cause them to break more easily.  Blood pressure screening. Blood pressure changes and medicines to control blood pressure can make you feel dizzy.  Strength and balance checks. Your health care provider may recommend certain tests to  check your strength and balance while standing, walking, or changing positions.  Foot health exam. Foot pain and numbness, as well as not wearing proper footwear, can make you more likely to have a fall.  Depression screening. You may be more likely to have a fall if you have a fear of falling, feel emotionally low, or feel unable to do activities that you used to do.  Alcohol use screening. Using too much alcohol can affect your balance and may make you more likely to have a fall. What actions can I take to lower my risk of falls? General instructions  Talk with your health care provider about your risks for falling. Tell your health care provider if: ? You fall. Be sure to tell your health care provider about all falls, even ones that seem minor. ? You feel dizzy, sleepy, or off-balance.  Take over-the-counter and prescription medicines only as told by your health care provider. These include any supplements.  Eat a healthy diet and maintain a healthy weight. A healthy diet includes low-fat dairy products, low-fat (lean) meats, and fiber from whole grains, beans, and lots of fruits and vegetables. Home safety  Remove any tripping hazards, such as rugs, cords, and clutter.  Install safety equipment such as grab bars in bathrooms and safety rails on stairs.  Keep rooms and walkways well-lit. Activity  Follow a regular exercise program to stay fit. This will help you maintain your balance. Ask your health care provider what types of exercise are appropriate for you.  If you need a cane   or walker, use it as recommended by your health care provider.  Wear supportive shoes that have nonskid soles.   Lifestyle  Do not drink alcohol if your health care provider tells you not to drink.  If you drink alcohol, limit how much you have: ? 0-1 drink a day for women. ? 0-2 drinks a day for men.  Be aware of how much alcohol is in your drink. In the [place] one drink equals one typical bottle  of beer (12 oz), one-half glass of wine (5 oz), or one shot of hard liquor (1 oz).  Do not use any products that contain nicotine or tobacco, such as cigarettes and e-cigarettes. If you need help quitting, ask your health care provider. Summary  Having a healthy lifestyle and getting preventive care can help to protect your health and wellness after age 71.  Screening and testing are the best way to find a health problem early and help you avoid having a fall. Early diagnosis and treatment give you the best chance for managing medical conditions that are more common for people who are older than age 71.  Falls are a major cause of broken bones and head injuries in people who are older than age 71. Take precautions to prevent a fall at home.  Work with your health care provider to learn what changes you can make to improve your health and wellness and to prevent falls. This information is not intended to replace advice given to you by your health care provider. Make sure you discuss any questions you have with your health care provider. Document Revised: 01/06/2019 Document Reviewed: 07/29/2017 Elsevier Patient Education  2021 [employer]vier [employer].   If you have lab work done today you will be contacted with your lab results within the next 2 weeks.  If you have not heard from us then please contact us. The fastest way to get your results is to register for My Chart.   IF you received an x-ray today, you will receive an invoice from [facility]. Please contact [facility] at [phone] with questions or concerns regarding your invoice.   IF you received labwork today, you will receive an invoice from [facility]. Please contact [facility] at [phone] with questions or concerns regarding your invoice.   Our billing staff will not be able to assist you with questions regarding bills from these companies.  You will be contacted with the lab results as soon as they are available.  The fastest way to get your results is to activate your My Chart account. Instructions are located on the last page of this paperwork. If you have not heard from us regarding the results in 2 weeks, please contact this office.      

## 2020-12-04 LAB — CMP14+EGFR
ALT: 11 IU/L (ref 0–44)
AST: 14 IU/L (ref 0–40)
Albumin/Globulin Ratio: 1.9 (ref 1.2–2.2)
Albumin: 4.4 g/dL (ref 3.8–4.8)
Alkaline Phosphatase: 91 IU/L (ref 44–121)
BUN/Creatinine Ratio: 11 (ref 10–24)
BUN: 14 mg/dL (ref 8–27)
Bilirubin Total: <0.2 mg/dL (ref 0.0–1.2)
CO2: 17 mmol/L — ABNORMAL LOW (ref 20–29)
Calcium: 9.4 mg/dL (ref 8.6–10.2)
Chloride: 105 mmol/L (ref 96–106)
Creatinine, Ser: 1.25 mg/dL (ref 0.76–1.27)
Globulin, Total: 2.3 g/dL (ref 1.5–4.5)
Glucose: 76 mg/dL (ref 65–99)
Potassium: 3.8 mmol/L (ref 3.5–5.2)
Sodium: 139 mmol/L (ref 134–144)
Total Protein: 6.7 g/dL (ref 6.0–8.5)
eGFR: 62 mL/min/{1.73_m2} (ref >59)

## 2020-12-04 LAB — LIPID PANEL
Chol/HDL Ratio: 3.7 ratio (ref 0.0–5.0)
Cholesterol, Total: 100 mg/dL (ref 100–199)
HDL: 27 mg/dL — ABNORMAL LOW (ref >39)
LDL Chol Calc (NIH): 48 mg/dL (ref 0–99)
Triglycerides: 144 mg/dL (ref 0–149)
VLDL Cholesterol Cal: 25 mg/dL (ref 5–40)

## 2020-12-04 LAB — HEMOGLOBIN A1C
Est. average glucose Bld gHb Est-mCnc: 120 mg/dL
Hgb A1c MFr Bld: 5.8 % — ABNORMAL HIGH (ref 4.8–5.6)

## 2020-12-04 LAB — PSA: Prostate Specific Ag, Serum: 1.3 ng/mL (ref 0.0–4.0)

## 2020-12-04 NOTE — Progress Notes (Signed)
Overall labs look good. A1c is elevated placing you in the pre-diabetes range. No medications needed at this time continue to work on diet and exercise. Kidney and liver function are both improved

## 2020-12-11 ENCOUNTER — Other Ambulatory Visit: Payer: Self-pay | Admitting: Cardiovascular Disease

## 2020-12-13 ENCOUNTER — Ambulatory Visit: Payer: 59 | Admitting: Physical Therapy

## 2020-12-19 ENCOUNTER — Ambulatory Visit: Payer: 59 | Attending: Family Medicine | Admitting: Physical Therapy

## 2020-12-28 ENCOUNTER — Ambulatory Visit: Payer: 59 | Admitting: Podiatry

## 2020-12-30 ENCOUNTER — Other Ambulatory Visit: Payer: Self-pay | Admitting: Cardiovascular Disease

## 2021-01-01 ENCOUNTER — Ambulatory Visit (INDEPENDENT_AMBULATORY_CARE_PROVIDER_SITE_OTHER): Payer: 59 | Admitting: Podiatry

## 2021-01-01 ENCOUNTER — Encounter: Payer: Self-pay | Admitting: Podiatry

## 2021-01-01 ENCOUNTER — Other Ambulatory Visit: Payer: Self-pay

## 2021-01-01 DIAGNOSIS — M79675 Pain in left toe(s): Secondary | ICD-10-CM | POA: Diagnosis not present

## 2021-01-01 DIAGNOSIS — N183 Chronic kidney disease, stage 3 unspecified: Secondary | ICD-10-CM | POA: Diagnosis not present

## 2021-01-01 DIAGNOSIS — B351 Tinea unguium: Secondary | ICD-10-CM

## 2021-01-01 DIAGNOSIS — I739 Peripheral vascular disease, unspecified: Secondary | ICD-10-CM

## 2021-01-01 DIAGNOSIS — M79674 Pain in right toe(s): Secondary | ICD-10-CM

## 2021-01-01 NOTE — Progress Notes (Signed)
This patient returns to my office for at risk foot care.  This patient requires this care by a professional since this patient will be at risk due to having chronic kidney disease  This patient is unable to cut nails himself since the patient cannot reach his nails.These nails are painful walking and wearing shoes.  This patient presents for at risk foot care today.  General Appearance  Alert, conversant and in no acute stress.  Vascular  Dorsalis pedis and posterior tibial  pulses are palpable  bilaterally.  Capillary return is within normal limits  bilaterally. Temperature is within normal limits  bilaterally.  Neurologic  Senn-Weinstein monofilament wire test within normal limits  bilaterally. Muscle power within normal limits bilaterally.  Nails Thick disfigured discolored nails with subungual debris  from hallux to fifth toes bilaterally. No evidence of bacterial infection or drainage bilaterally.  Orthopedic  No limitations of motion  feet .  No crepitus or effusions noted.  No bony pathology or digital deformities noted.  Skin  normotropic skin with no porokeratosis noted bilaterally.  No signs of infections or ulcers noted.     Onychomycosis  Pain in right toes  Pain in left toes  Consent was obtained for treatment procedures.   Mechanical debridement of nails 1-5  bilaterally performed with a nail nipper.  Filed with dremel without incident.    Return office visit   10 weeks                  Told patient to return for periodic foot care and evaluation due to potential at risk complications.   Gardiner Barefoot DPM

## 2021-01-22 ENCOUNTER — Other Ambulatory Visit: Payer: Self-pay | Admitting: Cardiovascular Disease

## 2021-02-18 ENCOUNTER — Ambulatory Visit: Payer: 59 | Admitting: Cardiovascular Disease

## 2021-02-18 NOTE — Progress Notes (Incomplete)
Telehealth Virtual Visit:   {Choose 1 Note Type (Video or Telephone):[phone]}   The patient was identified using 2 identifiers.  Date:  02/18/2021   ID:  Jaime Barnes, DOB Feb 23, 1951, MRN [id number]  {Patient Location:[phone]::"Home"} {Provider Location:[id number]::"Home Office"}  PCP:  Just, Laurita Quint, FNP (Inactive)  Cardiologist:  Skeet Latch, MD *** Electrophysiologist:  None   Evaluation Performed:  {Choose Visit Type:[phone]::"Follow-Up Visit"}  Chief Complaint:  ***  History of Present Illness:     Jaime Barnes is a 70 y.o. male with hypertension, hyperlipidemia non-obstructive CAD, moderate PAD, and mild carotid stenosis who presents for follow up. Jaime Barnes was evaluated by his PCP's office on 04/2015 with symptoms concerning for claudication. He reported one month of leg cramping and pain when walking up an incline. He had no pain when walking down hill or on flat land. He underwent ABI testing that was negative for occlusive disease. He was referred to cardiology due to suspicion that this was a false negative. He was referred for exercise ABIs that were normal but Doppler showed 30-49% SFA disease without focal stenosis. The L peroneal was occluded.  Jaime Barnes had a [place] 06/2004 that revealed 40% LAD, [name] LVEF was 50%. Repeat ABIs 06/2019 were unchanged.    HCTZ-lisinopril was switched to amlodipine 2/2 worsening renal function. He followed up with Almyra Deforest, PA on 04/2017 and was doing well. He again saw Isaac Laud 04/2018 for dizziness. He was referred for carotid Dopplers that revealed mild stenosis bilaterally. He also had a 30-day event monitor that showed sinus rhythm with occasional PACs and PVCs. He also had a 5 beat run of NSVT. Jaime Barnes was referred for a Lexiscan Myoview 06/2018 that revealed LVEF 47% and no ischemia.  Jaime Barnes was referred for repeat ABIs 06/2018 which were normal.  Ultrasound was not performed.  Since his last  appointment Jaime Barnes has been doing well.  He continues to exercise regularly.  He walks his dog almost every day.  He also plays tennis several times per week.  He has to stop after 4 or 5 minutes because of pain in his legs.  He denies any chest pain or shortness of breath.  He also has not noted any lower extremity edema, orthopnea, or PND.  His blood pressure is consistently less than 130/80.  He continues to smoke 1 cigar but it last him for 2 or 3 days.    Today,  He denies any chest pain, shortness of breath, palpitations, or exertional symptoms. No headaches, lightheadedness, or syncope to report. Also has no lower extremity edema, orthopnea or PND.  The patient {does/does not:200015} have symptoms concerning for COVID-19 infection (fever, chills, cough, or new shortness of breath).   Past Medical History:  Diagnosis Date   Barrett's esophagus 2011   Recommend repeat EGD for surveillance in 2 yrs-[facility]   Bipolar disorder ([place])    Chronic kidney disease    Cirrhosis ([place])    Diverticulosis of colon    GANGLION CYST, TENDON SHEATH 06/26/2010   Hyperlipidemia    Hypertension    Hypertriglyceridemia 01/21/2019   Impotence    Migraine    Pancreatitis    Pulmonary nodule    Pulmonary nodule seen on imaging study 02/06/2011   Followup CT in December 2014 with 20 months of stability at 4 mm.  No further imaging recommended     Past Surgical History:  Procedure Laterality Date   CARDIAC CATHETERIZATION  2005  Nonobstructive, <40%   TONSILLECTOMY Bilateral 1961     Current Outpatient Medications  Medication Sig Dispense Refill   albuterol (VENTOLIN HFA) 108 (90 Base) MCG/ACT inhaler 1 puff     amLODipine (NORVASC) 10 MG tablet TAKE 1 TABLET(10 MG) BY MOUTH DAILY 90 tablet 3   aspirin 81 MG chewable tablet Chew 81 mg by mouth daily.     atorvastatin (LIPITOR) 40 MG tablet 1 tablet     gabapentin (NEURONTIN) 300 MG capsule 1 capsule     ibuprofen (ADVIL)  800 MG tablet      lisinopril-hydrochlorothiazide (ZESTORETIC) 10-12.5 MG tablet 1 tablet     MAGNESIUM-OXIDE 400 (241.3 Mg) MG tablet TAKE 1 TABLET BY MOUTH EVERY DAY 30 tablet 0   meclizine (ANTIVERT) 12.5 MG tablet TAKE 1 TABLET BY MOUTH THREE TIMES DAILY AS NEEDED FOR DIZZINESS 30 tablet 0   meloxicam (MOBIC) 15 MG tablet Take 1 tablet (15 mg total) by mouth daily. 30 tablet 3   methocarbamol (ROBAXIN) 750 MG tablet TAKE 1 TABLET(750 MG) BY MOUTH TWICE DAILY AS NEEDED FOR MUSCLE SPASMS 20 tablet 1   metoprolol succinate (TOPROL-XL) 25 MG 24 hr tablet TAKE 1 TABLET(25 MG) BY MOUTH DAILY 90 tablet 3   omeprazole (PRILOSEC) 40 MG capsule 1 capsule     Polyethylene Glycol 3350 (GLYCOLAX PO) 17g (1 capful)     risperiDONE (RISPERDAL) 1 MG tablet 1 tablet     sildenafil (VIAGRA) 100 MG tablet TAKE 1/2 (ONE-HALF) TABLET BY MOUTH ONCE DAILY AS NEEDED FOR  ERECTILE  DYSFUNCTION 30 tablet 2   tiZANidine (ZANAFLEX) 4 MG tablet TAKE 1 TABLET(4 MG) BY MOUTH AT BEDTIME AS NEEDED FOR MUSCLE SPASMS 30 tablet 2   topiramate (TOPAMAX) 50 MG tablet 1 tablet     traZODone (DESYREL) 100 MG tablet Take 0.5-1 tablets (50-100 mg total) by mouth at bedtime. 60 tablet 1   Ubrogepant (UBRELVY) 100 MG TABS TAKE 1 TABLET(100 MG) BY MOUTH 1 TIME EVERY 2 HOURS AS DIRECTED AS NEEDED. MAXIMUM 2 TABLETS IN 24 HOURS 16 tablet 5   valACYclovir (VALTREX) 500 MG tablet Take 1 tablet (500 mg total) by mouth 2 (two) times daily. 6 tablet 3   verapamil (CALAN-SR) 120 MG CR tablet 1 tab     No current facility-administered medications for this visit.    Allergies:   Other, Codeine phosphate, and Penicillins    Social History:  The patient  reports that he has been smoking cigars. He started smoking about 58 years ago. He has a 13.25 pack-year smoking history. He quit smokeless tobacco use about 3 years ago. He reports current drug use. Drug: Marijuana. He reports that he does not drink alcohol.   Family History:   The patient's family history includes Anemia in his mother; Diabetes in his daughter; Hypertension in his daughter; Migraines in his daughter.    ROS:   Please see the history of present illness. (+) All other systems are reviewed and negative.    PHYSICAL EXAM: There were no vitals taken for this visit. GENERAL: Well-appearing.  No acute distress. HEENT: Pupils equal round.  Oral mucosa unremarkable NECK:  No jugular venous distention, no visible thyromegaly EXT:  No edema, no cyanosis no clubbing SKIN:  No rashes no nodules NEURO:  Speech fluent.  Cranial nerves grossly intact.  Moves all 4 extremities freely PSYCH:  Cognitively intact, oriented to person place and time    EKG:   02/18/2021: EKG is not ordered today. 07/28/19: Sinus  bradycardia.  Rate 57 bpm.  Non-specific T wave abnormalities.  04/30/15: sinus bradycardia at 59 bpm. 03/30/12: Sinus rhythm. Rate 73 bpm. PVCs.    TTE 12/03/07: LVEF 55-65%. Trace MR.  LHC 07/03/04:40% mid LAD, 30% mid LCx, 40% mid RCA, 20% distal RCA. LV gram LVEF 50%.  Exercise Myoview 12/03/07:Exercised 7 minutes on Bruce protocol. 8 METS. Adequate HR response. No infarct or inducible ischemia.  30 day event monitor 04/28/18: Quality: Fair. Baseline artifact. Predominant rhythm: sinus rhythm Average heart rate: 73 bpm Max heart rate: 151 bpm Min heart rate: 51 bpm  PACs and PVCs noted 5 beats NSVT  Carotid Doppler 04/30/18: 1-39% ICA stenosis biaterally  ABI 06/2018: 1.2 bilaterally  Lexiscan Myoview 06/2018:  LVEF 47%.  No ischemia.  [place] AAA Duplex 09/13/2020: Abdominal Aorta: No evidence of an abdominal aortic aneurysm was  visualized. Right CIA 50-74% stenosis.   Recent Labs: 12/03/2020: ALT 11; BUN 14; Creatinine, Ser 1.25; Potassium 3.8; Sodium 139    Lipid Panel    Component Value Date/Time   CHOL 100 12/03/2020 1411   TRIG 144 12/03/2020 1411   HDL 27 (L) 12/03/2020 1411   CHOLHDL 3.7 12/03/2020 1411    CHOLHDL 3.8 07/16/2015 1522   VLDL 37 (H) 07/16/2015 1522   LDLCALC 48 12/03/2020 1411   LDLDIRECT 156 (H) 06/09/2011 1555      Wt Readings from Last 3 Encounters:  12/03/20 171 lb (77.6 kg)  11/05/20 173 lb 12.8 oz (78.8 kg)  09/06/20 173 lb (78.5 kg)      Other studies Reviewed: Additional studies/ records that were reviewed today include: . Review of the above records demonstrates:  Please see elsewhere in the note.    ASSESSMENT AND PLAN: No problem-specific Assessment & Plan notes found for this encounter.   # Non-obstructive CAD: Mild disease on cath in 2005.  He has no symptoms of angina and exercises regularly.  Continue aspirin, atorvastatin, and metoprolol.  He had a stress test 06/2018 that was negative for ischemia.  LDL is well-controlled.    # NSVT:  # PVCs/PACs: No recent palpitations.  Continue metoprolol.  Negative stress test 06/2018.  # PAD: Mr. Kenner had moderate PAD 05/2015 by ultrasound but normal ABIs.  ABIs were also normal 06/2019 and he has good pulses.  Continue aspirin and atorvastatin.His leg pain may be more related to sciatica.  # HTN: BP remains well-controlled. It was initially elevated and improved on repeat.Continue amlodipine, verapamil and metoprolol.  # Hyperlipidemia: # Hypertriglyceridemia: LDL was 57 on 02/2019.  Continue atorvastatin.  # CV Disease prevention: Continue ASA 81mg  dailyand statin.  # Tobacco cessation:Patient encouraged to quit cigars as well.   COVID-19 Education: The signs and symptoms of COVID-19 were discussed with the patient and how to seek care for testing (follow up with PCP or arrange E-visit).  ***The importance of social distancing was discussed today.  Time:   Today, I have spent *** minutes with the patient with telehealth technology discussing the above problems.   Current medicines are reviewed at length with the patient today.  The patient does not have concerns regarding  medicines.  The following changes have been made:  no change  Labs/ tests ordered today include:   No orders of the defined types were placed in this encounter.    Disposition:   FU with Dr. Jonelle Sidle C. Winthrop in *** year.  Waynetta Pean  02/18/2021 8:10 AM    [place]

## 2021-02-19 DIAGNOSIS — K7460 Unspecified cirrhosis of liver: Secondary | ICD-10-CM | POA: Diagnosis not present

## 2021-02-19 DIAGNOSIS — K2970 Gastritis, unspecified, without bleeding: Secondary | ICD-10-CM | POA: Diagnosis not present

## 2021-02-19 DIAGNOSIS — R112 Nausea with vomiting, unspecified: Secondary | ICD-10-CM | POA: Diagnosis not present

## 2021-02-19 NOTE — Progress Notes (Signed)
Cardiology Office Note   Date:  02/21/2021   ID:  Jaime Barnes, DOB 02-Feb-1951, MRN [id number]  PCP:  Just, Laurita Quint, FNP (Inactive)  Cardiologist:   Skeet Latch, MD  Cardiology APP: Almyra Deforest, PA  Chief Complaint  Patient presents with  . Follow-up     History of Present Illness: Jaime Barnes is a 70 y.o. male with hypertension, hyperlipidemia non-obstructive CAD, moderate PAD, and mild carotid stenosis who presents for follow up. [name] was evaluated by his PCP's office on 04/2015 with symptoms concerning for claudication. He reported one month of leg cramping and pain when walking up an incline. He had no pain when walking down hill or on flat land. He underwent ABI testing that was negative for occlusive disease. He was referred to cardiology due to suspicion that this was a false negative. He was referred for exercise ABIs that were normal but Doppler showed 30-49% SFA disease without focal stenosis. The L peroneal was occluded.  [name] had a [place] 06/2004 that revealed 40% LAD, [name] LVEF was 50%. Repeat ABIs 06/2019 were unchanged.    HCTZ-lisinopril was switched to amlodipine 2/2 worsening renal function. He followed up with Almyra Deforest, PA on 04/2017 and was doing well. He again saw Jaime Barnes 04/2018 for dizziness. He was referred for carotid Dopplers that revealed mild stenosis bilaterally. He also had a 30-day event monitor that showed sinus rhythm with occasional PACs and PVCs. He also had a 5 beat run of NSVT. [name] was referred for a Lexiscan Myoview 06/2018 that revealed LVEF 47% and no ischemia.  [name] was referred for repeat ABIs 06/2018 which were normal.  He had a screening abdominal aorta ultrasound that revealed 50-74% of the right common iliac artery. His abdominal aorta was not dilated.     02/21/2021 This morning he had some right chest pain under his arm while he was sitting, and was relieved after he lifted and moved his arm. This  was the only instance of chest pain and he is not concerned about this. Occasionally he is lightheaded after bending over, or while standing for a prolonged time after walking. At home his blood pressure has been stable, averaging in the 130s. Lately, he is walking for exercise, and tried to play tennis. However, after a while his bilateral LE began aching significantly forcing him to stop tennis. He has started returning to the gym for exercise as well. Typically, he smokes 1 cigar over 4 days. He notes beginning to feel sick of them, and after a puff he puts it down for a while. He plans to quit soon. Of note, he recently underwent an endoscopy with unremarkable results. He denies any shortness of breath, palpitations, or exertional symptoms. No headaches or syncope to report. Also has no lower extremity edema, orthopnea or PND.     Past Medical History:  Diagnosis Date  . Barrett's esophagus 2011   Recommend repeat EGD for surveillance in 2 yrs-[facility]  . Bipolar disorder ([place])   . Chronic kidney disease   . Cirrhosis ([place])   . Diverticulosis of colon   . GANGLION CYST, TENDON SHEATH 06/26/2010  . Hyperlipidemia   . Hypertension   . Hypertriglyceridemia 01/21/2019  . Impotence   . Migraine   . Pancreatitis   . Pulmonary nodule   . Pulmonary nodule seen on imaging study 02/06/2011   Followup CT in December 2014 with 20 months of stability at 4 mm.  No further imaging recommended     Past Surgical History:  Procedure Laterality Date  . CARDIAC CATHETERIZATION  2005   Nonobstructive, <40%  . TONSILLECTOMY Bilateral 1961     Current Outpatient Medications  Medication Sig Dispense Refill  . albuterol (VENTOLIN HFA) 108 (90 Base) MCG/ACT inhaler 1 puff    . amLODipine (NORVASC) 10 MG tablet TAKE 1 TABLET(10 MG) BY MOUTH DAILY 90 tablet 3  . aspirin 81 MG chewable tablet Chew 81 mg by mouth daily.    [name] atorvastatin (LIPITOR) 40 MG tablet 1 tablet    . gabapentin (NEURONTIN) 300 MG  capsule 1 capsule    . ibuprofen (ADVIL) 800 MG tablet     . lisinopril-hydrochlorothiazide (ZESTORETIC) 20-12.5 MG tablet Take 1 tablet by mouth daily. 90 tablet 3  . MAGNESIUM-OXIDE 400 (241.3 Mg) MG tablet TAKE 1 TABLET BY MOUTH EVERY DAY 30 tablet 0  . meclizine (ANTIVERT) 12.5 MG tablet TAKE 1 TABLET BY MOUTH THREE TIMES DAILY AS NEEDED FOR DIZZINESS 30 tablet 0  . meloxicam (MOBIC) 15 MG tablet Take 1 tablet (15 mg total) by mouth daily. 30 tablet 3  . methocarbamol (ROBAXIN) 750 MG tablet TAKE 1 TABLET(750 MG) BY MOUTH TWICE DAILY AS NEEDED FOR MUSCLE SPASMS 20 tablet 1  . metoprolol succinate (TOPROL-XL) 25 MG 24 hr tablet TAKE 1 TABLET(25 MG) BY MOUTH DAILY 90 tablet 3  . omeprazole (PRILOSEC) 40 MG capsule 1 capsule    . Polyethylene Glycol 3350 (GLYCOLAX PO) 17g (1 capful)    . risperiDONE (RISPERDAL) 1 MG tablet 1 tablet    . sildenafil (VIAGRA) 100 MG tablet TAKE 1/2 (ONE-HALF) TABLET BY MOUTH ONCE DAILY AS NEEDED FOR  ERECTILE  DYSFUNCTION 30 tablet 2  . tiZANidine (ZANAFLEX) 4 MG tablet TAKE 1 TABLET(4 MG) BY MOUTH AT BEDTIME AS NEEDED FOR MUSCLE SPASMS 30 tablet 2  . topiramate (TOPAMAX) 50 MG tablet 1 tablet    . traZODone (DESYREL) 100 MG tablet Take 0.5-1 tablets (50-100 mg total) by mouth at bedtime. 60 tablet 1  . Ubrogepant (UBRELVY) 100 MG TABS TAKE 1 TABLET(100 MG) BY MOUTH 1 TIME EVERY 2 HOURS AS DIRECTED AS NEEDED. MAXIMUM 2 TABLETS IN 24 HOURS 16 tablet 5  . valACYclovir (VALTREX) 500 MG tablet Take 1 tablet (500 mg total) by mouth 2 (two) times daily. 6 tablet 3   No current facility-administered medications for this visit.    Allergies:   Other, Codeine phosphate, and Penicillins    Social History:  The patient  reports that he has been smoking cigars. He started smoking about 58 years ago. He has a 13.25 pack-year smoking history. He quit smokeless tobacco use about 3 years ago. He reports current drug use. Drug: Marijuana. He reports that he does not drink  alcohol.   Family History:  The patient's family history includes Anemia in his mother; Diabetes in his daughter; Hypertension in his daughter; Migraines in his daughter.    ROS:   Please see the history of present illness. (+) Right chest pain (+) Bilateral LE aches/pain (+) Lightheadedness All other systems are reviewed and negative.    PHYSICAL EXAM: VS:  BP 140/74 (BP Location: Left Arm, Patient Position: Sitting, Cuff Size: Normal)   Pulse (!) 50   Ht 5\' 6"  (1.676 m)   Wt 163 lb (73.9 kg)   BMI 26.31 kg/m  , BMI Body mass index is 26.31 kg/m. GENERAL:  Well appearing HEENT: Pupils equal round and reactive, fundi not  visualized, oral mucosa unremarkable NECK:  No jugular venous distention, waveform within normal limits, carotid upstroke brisk and symmetric, no bruits LUNGS:  Clear to auscultation bilaterally HEART:  RRR.  PMI not displaced or sustained,S1 and S2 within normal limits, no S3, no S4, no clicks, no rubs, no murmurs ABD:  Flat, positive bowel sounds normal in frequency in pitch, no bruits, no rebound, no guarding, no midline pulsatile mass, no hepatomegaly, no splenomegaly EXT:  2 plus TP, 1+ DP bilaterally.  no edema, no cyanosis no clubbing SKIN:  No rashes no nodules NEURO:  Cranial nerves II through XII grossly intact, motor grossly intact throughout PSYCH:  Cognitively intact, oriented to person place and time   EKG:   04/30/15: sinus bradycardia at 59 bpm. 03/30/12: Sinus rhythm. Rate 73 bpm. PVCs.  07/28/19: Sinus bradycardia.  Rate 57 bpm.  Non-specific T wave abnormalities.  02/21/2021: Sinus bradycardia. Rate 50 bpm.  TTE 12/03/07: LVEF 55-65%. Trace MR.  LHC 07/03/04:40% mid LAD, 30% mid LCx, 40% mid RCA, 20% distal RCA. LV gram LVEF 50%.  Exercise Myoview 12/03/07:Exercised 7 minutes on Bruce protocol. 8 METS. Adequate HR response. No infarct or inducible ischemia.  30 day event monitor 04/28/18: Quality: Fair. Baseline artifact. Predominant  rhythm: sinus rhythm Average heart rate: 73 bpm Max heart rate: 151 bpm Min heart rate: 51 bpm  PACs and PVCs noted 5 beats NSVT  Carotid Doppler 04/30/18: 1-39% ICA stenosis biaterally  ABI 06/2018: 1.2 bilaterally  Lexiscan Myoview 06/2018:  LVEF 47%.  No ischemia.  ABI 07/20/2019: Right: Resting right ankle-brachial index is within normal range. No  evidence of significant right lower extremity arterial disease. The right  toe-brachial index is normal.   Left: Resting left ankle-brachial index is within normal range. No  evidence of significant left lower extremity arterial disease. The left  toe-brachial index is normal.  [place] AAA Duplex 09/13/2020: Abdominal Aorta: No evidence of an abdominal aortic aneurysm was  visualized. Right CIA 50-74% stenosis.   Recent Labs: 12/03/2020: ALT 11; BUN 14; Creatinine, Ser 1.25; Potassium 3.8; Sodium 139    Lipid Panel    Component Value Date/Time   CHOL 100 12/03/2020 1411   TRIG 144 12/03/2020 1411   HDL 27 (L) 12/03/2020 1411   CHOLHDL 3.7 12/03/2020 1411   CHOLHDL 3.8 07/16/2015 1522   VLDL 37 (H) 07/16/2015 1522   LDLCALC 48 12/03/2020 1411   LDLDIRECT 156 (H) 06/09/2011 1555      Wt Readings from Last 3 Encounters:  02/21/21 163 lb (73.9 kg)  12/03/20 171 lb (77.6 kg)  11/05/20 173 lb 12.8 oz (78.8 kg)      Other studies Reviewed: Additional studies/ records that were reviewed today include: . Review of the above records demonstrates:  Please see elsewhere in the note.    ASSESSMENT AND PLAN: CAD (coronary artery disease) .  He is asymptomatic.  Continue aspirin, atorvastatin, amlodipine, and metoprolol.  Lipids are at goal.  HYPERTENSION, BENIGN SYSTEMIC Blood pressure is not high have been controlled.  He is also bradycardic and reports some lightheadedness.  We will stop his verapamil.  Increase lisinopril to 20 mg and continue HCTZ at 12.5.  Continue amlodipine and metoprolol.  He will check his blood  pressures and bring  NSVT (nonsustained ventricular tachycardia) (HCC) Asymptomatic.  Continue metoprolol.  Hyperlipidemia LDL goal <70 Continue atorvastatin.   TOBACCO USER He is eager to quit smoking and thinks that he is ready to quit.  He has not any  nicotine replacement therapy or other medications.  PAD (peripheral artery disease) [facility]) Mr. Wiederholt had moderate PAD 05/2015 by ultrasound but normal ABIs.  ABIs were also normal 06/2019 and he has good pulses.  Continue aspirin and atorvastatin.   Current medicines are reviewed at length with the patient today.  The patient does not have concerns regarding medicines.  The following changes have been made:  no change  Labs/ tests ordered today include:   Orders Placed This Encounter  Procedures  . Basic metabolic panel  . AMB Referral to [facility] Pharm-D  . EKG 12-Lead     Disposition:   FU with PharmD in 4-6 weeks. FU with Dr. Lilli Light. [place] in 6 months.  I,Mathew Stumpf,acting as a Education administrator for Skeet Latch, MD.,have documented all relevant documentation on the behalf of Skeet Latch, MD,as directed by  Skeet Latch, MD while in the presence of Skeet Latch, MD.  I, Sunbright Oval Linsey, MD have reviewed all documentation for this visit.  The documentation of the exam, diagnosis, procedures, and orders on 02/21/2021 are all accurate and complete.   Signed, Skeet Latch, MD  02/21/2021 6:27 PM    [facility]

## 2021-02-21 ENCOUNTER — Ambulatory Visit (INDEPENDENT_AMBULATORY_CARE_PROVIDER_SITE_OTHER): Payer: Medicare Other | Admitting: Cardiovascular Disease

## 2021-02-21 ENCOUNTER — Other Ambulatory Visit: Payer: Self-pay

## 2021-02-21 ENCOUNTER — Encounter: Payer: Self-pay | Admitting: Cardiovascular Disease

## 2021-02-21 DIAGNOSIS — F17200 Nicotine dependence, unspecified, uncomplicated: Secondary | ICD-10-CM | POA: Diagnosis not present

## 2021-02-21 DIAGNOSIS — E785 Hyperlipidemia, unspecified: Secondary | ICD-10-CM

## 2021-02-21 DIAGNOSIS — I739 Peripheral vascular disease, unspecified: Secondary | ICD-10-CM

## 2021-02-21 DIAGNOSIS — I2510 Atherosclerotic heart disease of native coronary artery without angina pectoris: Secondary | ICD-10-CM

## 2021-02-21 DIAGNOSIS — I10 Essential (primary) hypertension: Secondary | ICD-10-CM | POA: Diagnosis not present

## 2021-02-21 DIAGNOSIS — I472 Ventricular tachycardia: Secondary | ICD-10-CM | POA: Diagnosis not present

## 2021-02-21 DIAGNOSIS — I4729 Other ventricular tachycardia: Secondary | ICD-10-CM

## 2021-02-21 MED ORDER — LISINOPRIL-HYDROCHLOROTHIAZIDE 20-12.5 MG PO TABS: 1.0000 | ORAL_TABLET | Freq: Every day | ORAL | 3 refills | Status: DC

## 2021-02-21 NOTE — Assessment & Plan Note (Signed)
Continue atorvastatin

## 2021-02-21 NOTE — Assessment & Plan Note (Signed)
Jaime Barnes had moderate PAD 05/2015 by ultrasound but normal ABIs.  ABIs were also normal 06/2019 and he has good pulses.  Continue aspirin and atorvastatin.

## 2021-02-21 NOTE — Assessment & Plan Note (Signed)
He is eager to quit smoking and thinks that he is ready to quit.  He has not any nicotine replacement therapy or other medications.

## 2021-02-21 NOTE — Assessment & Plan Note (Signed)
.    He is asymptomatic.  Continue aspirin, atorvastatin, amlodipine, and metoprolol.  Lipids are at goal.

## 2021-02-21 NOTE — Assessment & Plan Note (Signed)
Asymptomatic.  Continue metoprolol.

## 2021-02-21 NOTE — Assessment & Plan Note (Signed)
Blood pressure is not high have been controlled.  He is also bradycardic and reports some lightheadedness.  We will stop his verapamil.  Increase lisinopril to 20 mg and continue HCTZ at 12.5.  Continue amlodipine and metoprolol.  He will check his blood pressures and bring

## 2021-02-21 NOTE — Patient Instructions (Signed)
Medication Instructions:  STOP VERAPAMIL   INCREASE YOUR LISINOPRIL HCT TO 20-12.5 MG DAILY   *If you need a refill on your cardiac medications before your next appointment, please call your pharmacy*  Lab Work: BMET IN 1 WEEK   If you have labs (blood work) drawn today and your tests are completely normal, you will receive your results only by: [name] MyChart Message (if you have MyChart) OR . A paper copy in the mail If you have any lab test that is abnormal or we need to change your treatment, we will call you to review the results.  Testing/Procedures: NONE  Follow-Up: At [facility], you and your health needs are our priority.  As part of our continuing mission to provide you with exceptional heart care, we have created designated Provider Care Teams.  These Care Teams include your primary Cardiologist (physician) and Advanced Practice Providers (APPs -  Physician Assistants and Nurse Practitioners) who all work together to provide you with the care you need, when you need it.  We recommend signing up for the patient portal called "MyChart".  Sign up information is provided on this After Visit Summary.  MyChart is used to connect with patients for Virtual Visits (Telemedicine).  Patients are able to view lab/test results, encounter notes, upcoming appointments, etc.  Non-urgent messages can be sent to your provider as well.   To learn more about what you can do with MyChart, go to [email or web address].    Your next appointment:   6 month(s)  The format for your next appointment:   In Person  Provider:   DR [facility] AT [place]   Your physician recommends that you schedule a follow-up appointment in: PHARM D 4-6 WEEKS

## 2021-02-26 DIAGNOSIS — I10 Essential (primary) hypertension: Secondary | ICD-10-CM | POA: Diagnosis not present

## 2021-02-27 ENCOUNTER — Telehealth: Payer: Self-pay | Admitting: Cardiovascular Disease

## 2021-02-27 ENCOUNTER — Other Ambulatory Visit: Payer: Self-pay

## 2021-02-27 ENCOUNTER — Ambulatory Visit (INDEPENDENT_AMBULATORY_CARE_PROVIDER_SITE_OTHER): Payer: Medicare Other

## 2021-02-27 ENCOUNTER — Encounter: Payer: Self-pay | Admitting: Internal Medicine

## 2021-02-27 ENCOUNTER — Ambulatory Visit (INDEPENDENT_AMBULATORY_CARE_PROVIDER_SITE_OTHER): Payer: Medicare Other | Admitting: Internal Medicine

## 2021-02-27 VITALS — BP 126/74 | HR 63 | Temp 98.1°F | Resp 16 | Ht 66.0 in | Wt 164.0 lb

## 2021-02-27 DIAGNOSIS — K2270 Barrett's esophagus without dysplasia: Secondary | ICD-10-CM

## 2021-02-27 DIAGNOSIS — E785 Hyperlipidemia, unspecified: Secondary | ICD-10-CM

## 2021-02-27 DIAGNOSIS — R0789 Other chest pain: Secondary | ICD-10-CM

## 2021-02-27 DIAGNOSIS — M19011 Primary osteoarthritis, right shoulder: Secondary | ICD-10-CM | POA: Diagnosis not present

## 2021-02-27 DIAGNOSIS — N1832 Chronic kidney disease, stage 3b: Secondary | ICD-10-CM | POA: Diagnosis not present

## 2021-02-27 DIAGNOSIS — I10 Essential (primary) hypertension: Secondary | ICD-10-CM

## 2021-02-27 DIAGNOSIS — F319 Bipolar disorder, unspecified: Secondary | ICD-10-CM

## 2021-02-27 DIAGNOSIS — M25511 Pain in right shoulder: Secondary | ICD-10-CM | POA: Insufficient documentation

## 2021-02-27 DIAGNOSIS — M792 Neuralgia and neuritis, unspecified: Secondary | ICD-10-CM

## 2021-02-27 DIAGNOSIS — R079 Chest pain, unspecified: Secondary | ICD-10-CM | POA: Diagnosis not present

## 2021-02-27 LAB — BASIC METABOLIC PANEL
BUN/Creatinine Ratio: 10 (ref 10–24)
BUN: 16 mg/dL (ref 8–27)
CO2: 22 mmol/L (ref 20–29)
Calcium: 9.8 mg/dL (ref 8.6–10.2)
Chloride: 104 mmol/L (ref 96–106)
Creatinine, Ser: 1.59 mg/dL — ABNORMAL HIGH (ref 0.76–1.27)
Glucose: 78 mg/dL (ref 65–99)
Potassium: 4.2 mmol/L (ref 3.5–5.2)
Sodium: 142 mmol/L (ref 134–144)
eGFR: 46 mL/min/{1.73_m2} — ABNORMAL LOW (ref >59)

## 2021-02-27 MED ORDER — GABAPENTIN 300 MG PO CAPS
300.0000 mg | ORAL_CAPSULE | Freq: Two times a day (BID) | ORAL | 1 refills | Status: DC
Start: 2021-02-27 — End: 2021-07-26

## 2021-02-27 MED ORDER — RISPERIDONE 1 MG PO TABS: 1.0000 mg | ORAL_TABLET | Freq: Every day | ORAL | 1 refills | Status: DC

## 2021-02-27 MED ORDER — TRAMADOL HCL 50 MG PO TABS: 50.0000 mg | ORAL_TABLET | Freq: Two times a day (BID) | ORAL | 2 refills | Status: DC | PRN

## 2021-02-27 MED ORDER — OMEPRAZOLE 40 MG PO CPDR
40.0000 mg | DELAYED_RELEASE_CAPSULE | Freq: Every day | ORAL | 1 refills | Status: DC
Start: 2021-02-27 — End: 2021-07-01

## 2021-02-27 NOTE — Progress Notes (Signed)
Subjective:  Patient ID: Jaime Barnes, male    DOB: 1951/01/24  Age: 70 y.o. MRN: [id number]  CC: Shoulder Pain  This visit occurred during the SARS-CoV-2 public health emergency.  Safety protocols were in place, including screening questions prior to the visit, additional usage of staff PPE, and extensive cleaning of exam room while observing appropriate contact time as indicated for disinfecting solutions.    HPI Jaime Barnes presents for f/up and to establish.  He complains of a 3-week history of pain around his right shoulder blade and his right shoulder.  He says the pain radiates up and down his right arm and he has some numbness and tingling in his right upper extremity.  He has 2 NSAIDs listed on his medication list and he said none of those are helping.  He denies any trauma or injury.  He denies neck pain.  He has a history of painful peripheral neuropathy and requests a refill on gabapentin.  History Jaime Barnes has a past medical history of Barrett's esophagus (2011), Bipolar disorder ([place]), Chronic kidney disease, Cirrhosis ([place]), Diverticulosis of colon, GANGLION CYST, TENDON SHEATH (06/26/2010), Hyperlipidemia, Hypertension, Hypertriglyceridemia (01/21/2019), Impotence, Migraine, Pancreatitis, Pulmonary nodule, and Pulmonary nodule seen on imaging study (02/06/2011).   He has a past surgical history that includes Cardiac catheterization (2005) and Tonsillectomy (Bilateral, 1961).   His family history includes Anemia in his mother; Diabetes in his daughter; Hypertension in his daughter; Migraines in his daughter.He reports that he has been smoking cigars. He started smoking about 58 years ago. He has a 13.25 pack-year smoking history. He quit smokeless tobacco use about 3 years ago. He reports current drug use. Drug: Marijuana. He reports that he does not drink alcohol.  Outpatient Medications Prior to Visit  Medication Sig Dispense Refill  . albuterol (VENTOLIN HFA) 108 (90 Base)  MCG/ACT inhaler 1 puff    . amLODipine (NORVASC) 10 MG tablet TAKE 1 TABLET(10 MG) BY MOUTH DAILY 90 tablet 3  . aspirin 81 MG chewable tablet Chew 81 mg by mouth daily.    [name] atorvastatin (LIPITOR) 40 MG tablet 1 tablet    . lisinopril-hydrochlorothiazide (ZESTORETIC) 20-12.5 MG tablet Take 1 tablet by mouth daily. 90 tablet 3  . MAGNESIUM-OXIDE 400 (241.3 Mg) MG tablet TAKE 1 TABLET BY MOUTH EVERY DAY 30 tablet 0  . metoprolol succinate (TOPROL-XL) 25 MG 24 hr tablet TAKE 1 TABLET(25 MG) BY MOUTH DAILY 90 tablet 3  . Polyethylene Glycol 3350 (GLYCOLAX PO) 17g (1 capful)    . sildenafil (VIAGRA) 100 MG tablet TAKE 1/2 (ONE-HALF) TABLET BY MOUTH ONCE DAILY AS NEEDED FOR  ERECTILE  DYSFUNCTION 30 tablet 2  . topiramate (TOPAMAX) 50 MG tablet 1 tablet    . traZODone (DESYREL) 100 MG tablet Take 0.5-1 tablets (50-100 mg total) by mouth at bedtime. 60 tablet 1  . Ubrogepant (UBRELVY) 100 MG TABS TAKE 1 TABLET(100 MG) BY MOUTH 1 TIME EVERY 2 HOURS AS DIRECTED AS NEEDED. MAXIMUM 2 TABLETS IN 24 HOURS 16 tablet 5  . valACYclovir (VALTREX) 500 MG tablet Take 1 tablet (500 mg total) by mouth 2 (two) times daily. 6 tablet 3  . gabapentin (NEURONTIN) 300 MG capsule 1 capsule    . ibuprofen (ADVIL) 800 MG tablet     . meclizine (ANTIVERT) 12.5 MG tablet TAKE 1 TABLET BY MOUTH THREE TIMES DAILY AS NEEDED FOR DIZZINESS 30 tablet 0  . meloxicam (MOBIC) 15 MG tablet Take 1 tablet (15 mg total) by mouth daily. 30 tablet  3  . methocarbamol (ROBAXIN) 750 MG tablet TAKE 1 TABLET(750 MG) BY MOUTH TWICE DAILY AS NEEDED FOR MUSCLE SPASMS 20 tablet 1  . omeprazole (PRILOSEC) 40 MG capsule 1 capsule    . risperiDONE (RISPERDAL) 1 MG tablet 1 tablet    . tiZANidine (ZANAFLEX) 4 MG tablet TAKE 1 TABLET(4 MG) BY MOUTH AT BEDTIME AS NEEDED FOR MUSCLE SPASMS 30 tablet 2   No facility-administered medications prior to visit.    ROS Review of Systems  Constitutional: Negative for chills, diaphoresis, fatigue and fever.   HENT: Negative.  Negative for trouble swallowing.   Eyes: Negative.   Respiratory: Negative for cough, chest tightness, shortness of breath and wheezing.   Cardiovascular: Positive for chest pain. Negative for palpitations and leg swelling.  Gastrointestinal: Negative for abdominal pain, constipation, diarrhea, nausea and vomiting.  Endocrine: Negative.   Genitourinary: Negative.   Musculoskeletal: Positive for arthralgias. Negative for back pain, myalgias and neck pain.  Skin: Negative.   Neurological: Negative.  Negative for dizziness, facial asymmetry and weakness.  Hematological: Negative for adenopathy. Does not bruise/bleed easily.  Psychiatric/Behavioral: Negative.     Objective:  BP 126/74 (BP Location: Left Arm, Patient Position: Sitting, Cuff Size: Large)   Pulse 63   Temp 98.1 F (36.7 C) (Oral)   Resp 16   Ht 5\' 6"  (1.676 m)   Wt 164 lb (74.4 kg)   SpO2 96%   BMI 26.47 kg/m   Physical Exam Vitals reviewed.  Constitutional:      Appearance: Normal appearance.  HENT:     Nose: Nose normal.     Mouth/Throat:     Mouth: Mucous membranes are moist.  Eyes:     General: No scleral icterus.    Conjunctiva/sclera: Conjunctivae normal.  Cardiovascular:     Rate and Rhythm: Normal rate and regular rhythm.     Heart sounds: No murmur heard.   Pulmonary:     Effort: Pulmonary effort is normal.     Breath sounds: No wheezing, rhonchi or rales.  Chest:     Chest wall: No mass, deformity, swelling, tenderness, crepitus or edema.  Abdominal:     General: Abdomen is flat. Bowel sounds are normal. There is no distension.     Palpations: Abdomen is soft. There is no hepatomegaly, splenomegaly or mass.     Tenderness: There is no abdominal tenderness.  Musculoskeletal:        General: Normal range of motion.     Right shoulder: Normal.     Left shoulder: Normal.     Cervical back: Normal and neck supple.     Thoracic back: Normal. No signs of trauma.     Lumbar back:  Normal.     Right lower leg: No edema.     Left lower leg: No edema.  Lymphadenopathy:     Cervical: No cervical adenopathy.  Skin:    General: Skin is warm and dry.  Neurological:     General: No focal deficit present.     Mental Status: He is alert.  Psychiatric:        Mood and Affect: Mood normal.        Behavior: Behavior normal.        Thought Content: Thought content normal.        Judgment: Judgment normal.     Lab Results  Component Value Date   WBC 7.9 07/20/2018   HGB 13.5 07/20/2018   HCT 40.4 07/20/2018   PLT 215 07/20/2018  GLUCOSE 78 02/26/2021   CHOL 100 12/03/2020   TRIG 144 12/03/2020   HDL 27 (L) 12/03/2020   LDLDIRECT 156 (H) 06/09/2011   LDLCALC 48 12/03/2020   ALT 11 12/03/2020   AST 14 12/03/2020   NA 142 02/26/2021   K 4.2 02/26/2021   CL 104 02/26/2021   CREATININE 1.59 (H) 02/26/2021   BUN 16 02/26/2021   CO2 22 02/26/2021   TSH 2.240 10/21/2018   PSA 0.33 08/21/2009   INR 1.17 04/19/2015   HGBA1C 5.8 (H) 12/03/2020   DG Chest 2 View  Result Date: 02/27/2021 CLINICAL DATA:  Pain EXAM: CHEST - 2 VIEW COMPARISON:  July 19, 2015; chest CT September 27, 2020 FINDINGS: No edema or airspace opacity. Heart size and pulmonary vascularity are normal. No adenopathy. No bone lesions. IMPRESSION: No edema or airspace opacity. Cardiac silhouette within normal limits. Electronically Signed   By: Lowella Grip III M.D.   On: 02/27/2021 11:39   DG Shoulder Right  Result Date: 02/27/2021 CLINICAL DATA:  Pain EXAM: RIGHT SHOULDER - 2+ VIEW COMPARISON:  None. FINDINGS: Oblique, Y scapular, and axillary images obtained. No fracture or dislocation. There is moderate generalized joint space narrowing. No erosive changes. IMPRESSION: Moderate generalized osteoarthritic change. No fracture or dislocation. Electronically Signed   By: Lowella Grip III M.D.   On: 02/27/2021 11:40    Assessment & Plan:   [place] was seen today for shoulder  pain.  Diagnoses and all orders for this visit:  Hyperlipidemia LDL goal <70  Essential hypertension, benign- His blood pressure is adequately well controlled. -     CBC with Differential/Platelet; Future -     Urinalysis, Routine w reflex microscopic; Future  Acute pain of right shoulder- Plain films are remarkable for osteoarthritis.  I recommended that he start taking tramadol and to see sports medicine. -     DG Shoulder Right; Future -     Ambulatory referral to Sports Medicine  Right-sided chest wall pain- Based on his symptoms, exam, and plain films this is musculoskeletal chest wall pain. -     DG Chest 2 View; Future  Stage 3b chronic kidney disease ([place])- He was advised to avoid NSAIDs. -     CBC with Differential/Platelet; Future -     Urinalysis, Routine w reflex microscopic; Future -     Ambulatory referral to Nephrology  Bipolar 1 disorder ([place]) -     risperiDONE (RISPERDAL) 1 MG tablet; Take 1 tablet (1 mg total) by mouth at bedtime.  Barrett's esophagus without dysplasia -     omeprazole (PRILOSEC) 40 MG capsule; Take 1 capsule (40 mg total) by mouth daily.  Neuropathic pain -     gabapentin (NEURONTIN) 300 MG capsule; Take 1 capsule (300 mg total) by mouth 2 (two) times daily. -     traMADol (ULTRAM) 50 MG tablet; Take 1 tablet (50 mg total) by mouth every 12 (twelve) hours as needed.  Primary osteoarthritis of right shoulder -     traMADol (ULTRAM) 50 MG tablet; Take 1 tablet (50 mg total) by mouth every 12 (twelve) hours as needed.   I have discontinued Jaime Barnes's meclizine, ibuprofen, tiZANidine, methocarbamol, and meloxicam. I have also changed his risperiDONE, omeprazole, and gabapentin. Additionally, I am having him start on traMADol. Lastly, I am having him maintain his aspirin, valACYclovir, amLODipine, metoprolol succinate, sildenafil, Polyethylene Glycol 3350 (GLYCOLAX PO), albuterol, atorvastatin, topiramate, Ubrelvy, traZODone, MAGnesium-Oxide, and  lisinopril-hydrochlorothiazide.  Meds ordered this encounter  Medications  .  risperiDONE (RISPERDAL) 1 MG tablet    Sig: Take 1 tablet (1 mg total) by mouth at bedtime.    Dispense:  90 tablet    Refill:  1  . omeprazole (PRILOSEC) 40 MG capsule    Sig: Take 1 capsule (40 mg total) by mouth daily.    Dispense:  90 capsule    Refill:  1  . gabapentin (NEURONTIN) 300 MG capsule    Sig: Take 1 capsule (300 mg total) by mouth 2 (two) times daily.    Dispense:  180 capsule    Refill:  1  . traMADol (ULTRAM) 50 MG tablet    Sig: Take 1 tablet (50 mg total) by mouth every 12 (twelve) hours as needed.    Dispense:  60 tablet    Refill:  2     Follow-up: Return in about 3 months (around 05/30/2021).  Scarlette Calico, MD

## 2021-02-27 NOTE — Telephone Encounter (Signed)
Spoke with pt, aware of lab results and need for repeat lab work in 2 months. Lab orders mailed to the pt

## 2021-02-27 NOTE — Telephone Encounter (Signed)
Patient is returning call to discuss lab results. 

## 2021-02-27 NOTE — Telephone Encounter (Signed)
Left message for pt to call.

## 2021-02-27 NOTE — Patient Instructions (Signed)
Shoulder Pain Many things can cause shoulder pain, including:  An injury to the shoulder.  Overuse of the shoulder.  Arthritis. The source of the pain can be:  Inflammation.  An injury to the shoulder joint.  An injury to a tendon, ligament, or bone. Follow these instructions at home: Pay attention to changes in your symptoms. Let your health care provider know about them. Follow these instructions to relieve your pain. If you have a sling:  Wear the sling as told by your health care provider. Remove it only as told by your health care provider.  Loosen the sling if your fingers tingle, become numb, or turn cold and blue.  Keep the sling clean.  If the sling is not waterproof: ? Do not let it get wet. Remove it to shower or bathe.  Move your arm as little as possible, but keep your hand moving to prevent swelling. Managing pain, stiffness, and swelling  If directed, put ice on the painful area: ? Put ice in a plastic bag. ? Place a towel between your skin and the bag. ? Leave the ice on for 20 minutes, 2-3 times per day. Stop applying ice if it does not help with the pain.  Squeeze a soft ball or a foam pad as much as possible. This helps to keep the shoulder from swelling. It also helps to strengthen the arm.   General instructions  Take over-the-counter and prescription medicines only as told by your health care provider.  Keep all follow-up visits as told by your health care provider. This is important. Contact a health care provider if:  Your pain gets worse.  Your pain is not relieved with medicines.  New pain develops in your arm, hand, or fingers. Get help right away if:  Your arm, hand, or fingers: ? Tingle. ? Become numb. ? Become swollen. ? Become painful. ? Turn white or blue. Summary  Shoulder pain can be caused by an injury, overuse, or arthritis.  Pay attention to changes in your symptoms. Let your health care provider know about  them.  This condition may be treated with a sling, ice, and pain medicines.  Contact your health care provider if the pain gets worse or new pain develops. Get help right away if your arm, hand, or fingers tingle or become numb, swollen, or painful.  Keep all follow-up visits as told by your health care provider. This is important. This information is not intended to replace advice given to you by your health care provider. Make sure you discuss any questions you have with your health care provider. Document Revised: 03/30/2018 Document Reviewed: 03/30/2018 Elsevier Patient Education  2021 [employer] Inc.  

## 2021-03-03 DIAGNOSIS — D649 Anemia, unspecified: Secondary | ICD-10-CM | POA: Diagnosis not present

## 2021-03-03 DIAGNOSIS — E871 Hypo-osmolality and hyponatremia: Secondary | ICD-10-CM | POA: Diagnosis not present

## 2021-03-03 DIAGNOSIS — I129 Hypertensive chronic kidney disease with stage 1 through stage 4 chronic kidney disease, or unspecified chronic kidney disease: Secondary | ICD-10-CM | POA: Diagnosis not present

## 2021-03-03 DIAGNOSIS — N179 Acute kidney failure, unspecified: Secondary | ICD-10-CM | POA: Diagnosis not present

## 2021-03-03 DIAGNOSIS — R55 Syncope and collapse: Secondary | ICD-10-CM | POA: Diagnosis not present

## 2021-03-03 DIAGNOSIS — E872 Acidosis: Secondary | ICD-10-CM | POA: Diagnosis not present

## 2021-03-03 DIAGNOSIS — N1831 Chronic kidney disease, stage 3a: Secondary | ICD-10-CM | POA: Diagnosis not present

## 2021-03-03 DIAGNOSIS — E876 Hypokalemia: Secondary | ICD-10-CM | POA: Diagnosis not present

## 2021-03-05 NOTE — Progress Notes (Signed)
Subjective:    CC: R shoulder pain  I, Jaime Barnes, LAT, ATC, am serving as scribe for Dr. Lynne Barnes.  HPI: Pt is a 70 y/o male presenting w/ R shoulder/scapular pain x approximately 3-4 weeks w/ no known MOI. [place] and recalls an incident where he injured his R shoulder. He locates his pain to around the R scapula and lateral aspect of R shoulder. Pt notes pain radiates into upper arm, forearm, and hand/fingers. No neck pain, but pt does point to pain in the R trap region. Pt c/o pain taking away his favorite sport, tennis.  Radiating pain: yes into his R arm Neck pain: No R UE numbness/tingling: yes- whole hand and all fingers Aggravating factors: nothing in particular Treatments tried: NSAIDs; Tramadol; Gabapentin; shower  Diagnostic testing: R shoulder XR- 02/27/21; C-spine XR- 03/22/20  Pertinent review of Systems: No fevers or chills  Relevant historical information: Hypertension, peripheral arterial disease, history of alcoholic cirrhosis   Objective:    Vitals:   03/06/21 0755  BP: 112/72  Pulse: 63  SpO2: 97%   General: Well Developed, well nourished, and in no acute distress.   MSK: C-spine normal.  Nontender midline decreased cervical motion.  Negative Spurling's test.  Upper extremity strength is intact except noted below. Reflexes are intact.  Right shoulder normal. Nontender. Normal motion some pain with abduction. Positive Hawkins and Neer's test.  Positive empty can test. Strength 4/5 abduction and external rotation 5/5 internal rotation. Mildly positive Yergason's and speeds test.  Right wrist normal-appearing positive Tinel's and positive Phalen's test.  Grip strength pulses and capillary refill are intact.   Lab and Radiology Results  Diagnostic Limited MSK Ultrasound of:  Right [place] Right wrist:: Carpal tunnel visualized.  Median nerve Enlarged measuring 15 mm cross-sectional  area Impression: Mild to moderate carpal tunnel syndrome.   Procedure: Real-time Ultrasound Guided Injection of right shoulder subacromial bursa Device: Philips Affiniti 50G Images permanently stored and available for review in PACS Ultrasound evaluation prior to injection Right shoulder and wrist Right shoulder: Biceps tendon is intact.  Small amount of hypoechoic fluid tracks within tendon sheath indicating tenosynovitis. Subscapularis tendon is intact. Supraspinatus tendon is intact with subacromial bursitis present. Infraspinatus tendon is intact. AC joint degenerative with calcifications. Verbal informed consent obtained.  Discussed risks and benefits of procedure. Warned about infection bleeding damage to structures skin hypopigmentation and fat atrophy among others. Patient expresses understanding and agreement Time-out conducted.   Noted no overlying erythema, induration, or other signs of local infection.   Skin prepped in a sterile fashion.   Local anesthesia: Topical Ethyl chloride.   With sterile technique and under real time ultrasound guidance:  40 mg of Kenalog and 2 mL of Marcaine injected into right shoulder subacromial bursa. Fluid seen entering the bursa.   Completed without difficulty   Pain immediately resolved suggesting accurate placement of the medication.   Advised to call if fevers/chills, erythema, induration, drainage, or persistent bleeding.   Images permanently stored and available for review in the ultrasound unit.  Impression: Technically successful ultrasound guided injection.        Impression and Recommendations:    Assessment and Plan: 70 y.o. male with right shoulder and arm pain multifactorial.  Pain thought predominantly due to subacromial bursitis and carpal tunnel syndrome.  Subacromial bursitis.  Treated with subacromial injection today which relieved pain significantly.  However patient will require physical therapy  and home exercise  program to fully benefit.  Plan to refer to PT and recheck in 6 weeks.  Right hand paresthesias thought to be due to carpal tunnel syndrome.  Cervical radiculopathy is a possibility but is much less likely.  Patient has positive Tinel's and Phalen's test and ultrasound findings significant for carpal tunnel syndrome.  Plan to treat with night splint and recheck in 6 weeks.  Consider injection and nerve conduction study as next steps if not better..  Patient has a history of multiple medical problems that puts him at increased risk for peripheral nerve injury.  Nerve conduction study may be helpful to evaluate this in the future.  PDMP not reviewed this encounter. Orders Placed This Encounter  Procedures  . [place] LIMITED JOINT SPACE STRUCTURES UP RIGHT(NO LINKED CHARGES)    Standing Status:   Future    Number of Occurrences:   1    Standing Expiration Date:   09/05/2021    Order Specific Question:   Reason for Exam (SYMPTOM  OR DIAGNOSIS REQUIRED)    Answer:   right shoulder pain    Order Specific Question:   Preferred imaging location?    Answer:   [place]  . Ambulatory referral to Physical Therapy    Referral Priority:   Routine    Referral Type:   Physical Medicine    Referral Reason:   Specialty Services Required    Requested Specialty:   Physical Therapy   No orders of the defined types were placed in this encounter.   Discussed warning signs or symptoms. Please see discharge instructions. Patient expresses understanding.   The above documentation has been reviewed and is accurate and complete Jaime Barnes, M.D.

## 2021-03-06 ENCOUNTER — Telehealth: Payer: Self-pay | Admitting: Internal Medicine

## 2021-03-06 ENCOUNTER — Other Ambulatory Visit: Payer: Self-pay | Admitting: Internal Medicine

## 2021-03-06 ENCOUNTER — Ambulatory Visit (INDEPENDENT_AMBULATORY_CARE_PROVIDER_SITE_OTHER): Payer: Medicare Other | Admitting: Family Medicine

## 2021-03-06 ENCOUNTER — Other Ambulatory Visit: Payer: Self-pay

## 2021-03-06 ENCOUNTER — Ambulatory Visit: Payer: Self-pay

## 2021-03-06 VITALS — BP 112/72 | HR 63 | Ht 66.0 in | Wt 164.6 lb

## 2021-03-06 DIAGNOSIS — M7551 Bursitis of right shoulder: Secondary | ICD-10-CM | POA: Diagnosis not present

## 2021-03-06 DIAGNOSIS — G5601 Carpal tunnel syndrome, right upper limb: Secondary | ICD-10-CM | POA: Diagnosis not present

## 2021-03-06 DIAGNOSIS — M25511 Pain in right shoulder: Secondary | ICD-10-CM | POA: Diagnosis not present

## 2021-03-06 DIAGNOSIS — J45990 Exercise induced bronchospasm: Secondary | ICD-10-CM

## 2021-03-06 MED ORDER — ALBUTEROL SULFATE HFA 108 (90 BASE) MCG/ACT IN AERS
1.0000 | INHALATION_SPRAY | Freq: Four times a day (QID) | RESPIRATORY_TRACT | 3 refills | Status: DC | PRN
Start: 2021-03-06 — End: 2021-04-15

## 2021-03-06 NOTE — Telephone Encounter (Signed)
    Patient requesting order for albuterol (VENTOLIN HFA) 108 (90 Base) MCG/ACT inhaler   [place], [place]

## 2021-03-06 NOTE — Patient Instructions (Signed)
Thank you for coming in today.  Call or go to the ER if you develop a large red swollen joint with extreme pain or oozing puss.   I've referred you to Physical Therapy.  Let us know if you don't hear from them in one week.  Use a carpal tunnel brace at night.   Recheck with me in 6 weeks.   Let me know if this is not working or if you have problems.

## 2021-03-07 ENCOUNTER — Ambulatory Visit: Payer: Medicare Other | Attending: Family Medicine | Admitting: Physical Therapy

## 2021-04-02 ENCOUNTER — Other Ambulatory Visit: Payer: Self-pay

## 2021-04-02 ENCOUNTER — Observation Stay (HOSPITAL_COMMUNITY)
Admission: EM | Admit: 2021-04-02 | Discharge: 2021-04-03 | Disposition: A | Discharge status: Home / Self Care | Payer: Medicare Other | Attending: Family Medicine | Admitting: Family Medicine

## 2021-04-02 ENCOUNTER — Inpatient Hospital Stay (HOSPITAL_COMMUNITY): Payer: Medicare Other

## 2021-04-02 ENCOUNTER — Ambulatory Visit (HOSPITAL_COMMUNITY)
Admission: EM | Admit: 2021-04-02 | Discharge: 2021-04-02 | Disposition: A | Discharge status: Home / Self Care | Payer: Medicare Other

## 2021-04-02 ENCOUNTER — Encounter (HOSPITAL_COMMUNITY): Payer: Self-pay

## 2021-04-02 DIAGNOSIS — N179 Acute kidney failure, unspecified: Principal | ICD-10-CM

## 2021-04-02 DIAGNOSIS — I10 Essential (primary) hypertension: Secondary | ICD-10-CM | POA: Diagnosis not present

## 2021-04-02 DIAGNOSIS — N183 Chronic kidney disease, stage 3 unspecified: Secondary | ICD-10-CM | POA: Insufficient documentation

## 2021-04-02 DIAGNOSIS — I129 Hypertensive chronic kidney disease with stage 1 through stage 4 chronic kidney disease, or unspecified chronic kidney disease: Secondary | ICD-10-CM | POA: Insufficient documentation

## 2021-04-02 DIAGNOSIS — R55 Syncope and collapse: Secondary | ICD-10-CM | POA: Diagnosis not present

## 2021-04-02 DIAGNOSIS — Z751 Person awaiting admission to adequate facility elsewhere: Secondary | ICD-10-CM

## 2021-04-02 DIAGNOSIS — I700 Atherosclerosis of aorta: Secondary | ICD-10-CM | POA: Diagnosis not present

## 2021-04-02 DIAGNOSIS — F17210 Nicotine dependence, cigarettes, uncomplicated: Secondary | ICD-10-CM | POA: Diagnosis not present

## 2021-04-02 DIAGNOSIS — R911 Solitary pulmonary nodule: Secondary | ICD-10-CM | POA: Diagnosis not present

## 2021-04-02 DIAGNOSIS — Z20822 Contact with and (suspected) exposure to covid-19: Secondary | ICD-10-CM | POA: Insufficient documentation

## 2021-04-02 DIAGNOSIS — E876 Hypokalemia: Secondary | ICD-10-CM | POA: Insufficient documentation

## 2021-04-02 DIAGNOSIS — E872 Acidosis: Secondary | ICD-10-CM | POA: Insufficient documentation

## 2021-04-02 DIAGNOSIS — E871 Hypo-osmolality and hyponatremia: Secondary | ICD-10-CM | POA: Diagnosis not present

## 2021-04-02 DIAGNOSIS — J984 Other disorders of lung: Secondary | ICD-10-CM | POA: Diagnosis not present

## 2021-04-02 LAB — COMPREHENSIVE METABOLIC PANEL
ALT: 13 U/L (ref 0–44)
AST: 18 U/L (ref 15–41)
Albumin: 3.9 g/dL (ref 3.5–5.0)
Alkaline Phosphatase: 62 U/L (ref 38–126)
Anion gap: 14 (ref 5–15)
BUN: 41 mg/dL — ABNORMAL HIGH (ref 8–23)
CO2: 18 mmol/L — ABNORMAL LOW (ref 22–32)
Calcium: 9.1 mg/dL (ref 8.9–10.3)
Chloride: 95 mmol/L — ABNORMAL LOW (ref 98–111)
Creatinine, Ser: 4.73 mg/dL — ABNORMAL HIGH (ref 0.61–1.24)
GFR, Estimated: 13 mL/min — ABNORMAL LOW (ref >60)
Glucose, Bld: 108 mg/dL — ABNORMAL HIGH (ref 70–99)
Potassium: 2.8 mmol/L — ABNORMAL LOW (ref 3.5–5.1)
Sodium: 127 mmol/L — ABNORMAL LOW (ref 135–145)
Total Bilirubin: 1 mg/dL (ref 0.3–1.2)
Total Protein: 6.8 g/dL (ref 6.5–8.1)

## 2021-04-02 LAB — I-STAT CHEM 8, ED
BUN: 38 mg/dL — ABNORMAL HIGH (ref 8–23)
Calcium, Ion: 1.1 mmol/L — ABNORMAL LOW (ref 1.15–1.40)
Chloride: 98 mmol/L (ref 98–111)
Creatinine, Ser: 5 mg/dL — ABNORMAL HIGH (ref 0.61–1.24)
Glucose, Bld: 106 mg/dL — ABNORMAL HIGH (ref 70–99)
HCT: 43 % (ref 39.0–52.0)
Hemoglobin: 14.6 g/dL (ref 13.0–17.0)
Potassium: 2.7 mmol/L — CL (ref 3.5–5.1)
Sodium: 128 mmol/L — ABNORMAL LOW (ref 135–145)
TCO2: 18 mmol/L — ABNORMAL LOW (ref 22–32)

## 2021-04-02 LAB — URINALYSIS, ROUTINE W REFLEX MICROSCOPIC
Bacteria, UA: NONE SEEN
Bilirubin Urine: NEGATIVE
Glucose, UA: NEGATIVE mg/dL
Ketones, ur: NEGATIVE mg/dL
Leukocytes,Ua: NEGATIVE
Nitrite: NEGATIVE
Protein, ur: NEGATIVE mg/dL
Specific Gravity, Urine: 1.008 (ref 1.005–1.030)
pH: 5 (ref 5.0–8.0)

## 2021-04-02 LAB — CBC WITH DIFFERENTIAL/PLATELET
Abs Immature Granulocytes: 0.06 10*3/uL (ref 0.00–0.07)
Basophils Absolute: 0.1 10*3/uL (ref 0.0–0.1)
Basophils Relative: 0 %
Eosinophils Absolute: 0.1 10*3/uL (ref 0.0–0.5)
Eosinophils Relative: 1 %
HCT: 41.4 % (ref 39.0–52.0)
Hemoglobin: 14.1 g/dL (ref 13.0–17.0)
Immature Granulocytes: 0 %
Lymphocytes Relative: 25 %
Lymphs Abs: 3.5 10*3/uL (ref 0.7–4.0)
MCH: 31.3 pg (ref 26.0–34.0)
MCHC: 34.1 g/dL (ref 30.0–36.0)
MCV: 91.8 fL (ref 80.0–100.0)
Monocytes Absolute: 1.3 10*3/uL — ABNORMAL HIGH (ref 0.1–1.0)
Monocytes Relative: 9 %
Neutro Abs: 8.9 10*3/uL — ABNORMAL HIGH (ref 1.7–7.7)
Neutrophils Relative %: 65 %
Platelets: 247 10*3/uL (ref 150–400)
RBC: 4.51 MIL/uL (ref 4.22–5.81)
RDW: 12.8 % (ref 11.5–15.5)
WBC: 13.9 10*3/uL — ABNORMAL HIGH (ref 4.0–10.5)
nRBC: 0 % (ref 0.0–0.2)

## 2021-04-02 LAB — RESP PANEL BY RT-PCR (FLU A&B, COVID) ARPGX2
Influenza A by PCR: NEGATIVE
Influenza B by PCR: NEGATIVE
SARS Coronavirus 2 by RT PCR: NEGATIVE

## 2021-04-02 LAB — TSH: TSH: 4.033 u[IU]/mL (ref 0.350–4.500)

## 2021-04-02 LAB — RENAL FUNCTION PANEL
Albumin: 3.7 g/dL (ref 3.5–5.0)
Anion gap: 9 (ref 5–15)
BUN: 38 mg/dL — ABNORMAL HIGH (ref 8–23)
CO2: 21 mmol/L — ABNORMAL LOW (ref 22–32)
Calcium: 8.7 mg/dL — ABNORMAL LOW (ref 8.9–10.3)
Chloride: 99 mmol/L (ref 98–111)
Creatinine, Ser: 3.09 mg/dL — ABNORMAL HIGH (ref 0.61–1.24)
GFR, Estimated: 21 mL/min — ABNORMAL LOW (ref >60)
Glucose, Bld: 92 mg/dL (ref 70–99)
Phosphorus: 4.4 mg/dL (ref 2.5–4.6)
Potassium: 2.7 mmol/L — CL (ref 3.5–5.1)
Sodium: 129 mmol/L — ABNORMAL LOW (ref 135–145)

## 2021-04-02 LAB — BASIC METABOLIC PANEL
Anion gap: 9 (ref 5–15)
BUN: 35 mg/dL — ABNORMAL HIGH (ref 8–23)
CO2: 21 mmol/L — ABNORMAL LOW (ref 22–32)
Calcium: 8.9 mg/dL (ref 8.9–10.3)
Chloride: 101 mmol/L (ref 98–111)
Creatinine, Ser: 2.92 mg/dL — ABNORMAL HIGH (ref 0.61–1.24)
GFR, Estimated: 22 mL/min — ABNORMAL LOW (ref >60)
Glucose, Bld: 152 mg/dL — ABNORMAL HIGH (ref 70–99)
Potassium: 2.9 mmol/L — ABNORMAL LOW (ref 3.5–5.1)
Sodium: 131 mmol/L — ABNORMAL LOW (ref 135–145)

## 2021-04-02 LAB — OSMOLALITY, URINE: Osmolality, Ur: 193 mOsm/kg — ABNORMAL LOW (ref 300–900)

## 2021-04-02 LAB — OSMOLALITY: Osmolality: 289 mOsm/kg (ref 275–295)

## 2021-04-02 LAB — CBG MONITORING, ED: Glucose-Capillary: 92 mg/dL (ref 70–99)

## 2021-04-02 LAB — LACTIC ACID, PLASMA: Lactic Acid, Venous: 1.3 mmol/L (ref 0.5–1.9)

## 2021-04-02 LAB — SODIUM, URINE, RANDOM: Sodium, Ur: 17 mmol/L

## 2021-04-02 LAB — MAGNESIUM: Magnesium: 1.9 mg/dL (ref 1.7–2.4)

## 2021-04-02 LAB — HIV ANTIBODY (ROUTINE TESTING W REFLEX): HIV Screen 4th Generation wRfx: NONREACTIVE

## 2021-04-02 MED ORDER — TRAMADOL HCL 50 MG PO TABS: 50.0000 mg | ORAL_TABLET | Freq: Two times a day (BID) | ORAL | Status: DC | PRN

## 2021-04-02 MED ORDER — PANTOPRAZOLE SODIUM 40 MG PO TBEC
40.0000 mg | DELAYED_RELEASE_TABLET | Freq: Every day | ORAL | Status: DC
  Administered 2021-04-03: 40 mg via ORAL
  Filled 2021-04-02 (×2): qty 1

## 2021-04-02 MED ORDER — GABAPENTIN 300 MG PO CAPS
300.0000 mg | ORAL_CAPSULE | Freq: Every day | ORAL | Status: DC
  Administered 2021-04-03: 300 mg via ORAL
  Filled 2021-04-02: qty 1

## 2021-04-02 MED ORDER — ALBUTEROL SULFATE (2.5 MG/3ML) 0.083% IN NEBU: 2.5000 mg | INHALATION_SOLUTION | Freq: Four times a day (QID) | RESPIRATORY_TRACT | Status: DC | PRN

## 2021-04-02 MED ORDER — HEPARIN SODIUM (PORCINE) 5000 UNIT/ML IJ SOLN
5000.0000 [IU] | Freq: Two times a day (BID) | INTRAMUSCULAR | Status: DC
  Administered 2021-04-02: 5000 [IU] via SUBCUTANEOUS
  Filled 2021-04-02 (×2): qty 1

## 2021-04-02 MED ORDER — SODIUM CHLORIDE 0.9 % IV SOLN: INTRAVENOUS | Status: DC

## 2021-04-02 MED ORDER — POTASSIUM CHLORIDE CRYS ER 20 MEQ PO TBCR
40.0000 meq | EXTENDED_RELEASE_TABLET | ORAL | Status: AC
  Administered 2021-04-02: 40 meq via ORAL
  Filled 2021-04-02: qty 2

## 2021-04-02 MED ORDER — ATORVASTATIN CALCIUM 40 MG PO TABS
40.0000 mg | ORAL_TABLET | Freq: Every day | ORAL | Status: DC
  Administered 2021-04-03: 40 mg via ORAL
  Filled 2021-04-02: qty 1

## 2021-04-02 MED ORDER — TRAZODONE HCL 50 MG PO TABS
50.0000 mg | ORAL_TABLET | Freq: Every day | ORAL | Status: DC
Start: 2021-04-02 — End: 2021-04-03
  Administered 2021-04-02: 50 mg via ORAL
  Filled 2021-04-02: qty 1

## 2021-04-02 MED ORDER — HYDRALAZINE HCL 25 MG PO TABS: 25.0000 mg | ORAL_TABLET | Freq: Four times a day (QID) | ORAL | Status: DC | PRN

## 2021-04-02 MED ORDER — SODIUM CHLORIDE 0.9 % IV BOLUS
1000.0000 mL | Freq: Once | INTRAVENOUS | Status: AC
  Administered 2021-04-02: 1000 mL via INTRAVENOUS

## 2021-04-02 MED ORDER — SODIUM BICARBONATE 650 MG PO TABS
650.0000 mg | ORAL_TABLET | Freq: Two times a day (BID) | ORAL | Status: DC
  Administered 2021-04-02 – 2021-04-03 (×2): 650 mg via ORAL
  Filled 2021-04-02 (×4): qty 1

## 2021-04-02 MED ORDER — POTASSIUM CHLORIDE CRYS ER 20 MEQ PO TBCR: 40.0000 meq | EXTENDED_RELEASE_TABLET | ORAL | Status: DC

## 2021-04-02 MED ORDER — POTASSIUM CHLORIDE CRYS ER 20 MEQ PO TBCR
40.0000 meq | EXTENDED_RELEASE_TABLET | Freq: Once | ORAL | Status: AC
  Administered 2021-04-02: 40 meq via ORAL
  Filled 2021-04-02: qty 2

## 2021-04-02 MED ORDER — GABAPENTIN 300 MG PO CAPS: 300.0000 mg | ORAL_CAPSULE | Freq: Two times a day (BID) | ORAL | Status: DC

## 2021-04-02 MED ORDER — RISPERIDONE 1 MG PO TABS
1.0000 mg | ORAL_TABLET | Freq: Every day | ORAL | Status: DC
  Administered 2021-04-02: 1 mg via ORAL
  Filled 2021-04-02 (×2): qty 1

## 2021-04-02 MED ORDER — ASPIRIN 81 MG PO CHEW
81.0000 mg | CHEWABLE_TABLET | Freq: Every day | ORAL | Status: DC
  Administered 2021-04-03: 81 mg via ORAL
  Filled 2021-04-02: qty 1

## 2021-04-02 NOTE — ED Notes (Signed)
Called report to ED charge RN.

## 2021-04-02 NOTE — ED Triage Notes (Signed)
Per roommate pt woke up and found himself on floor in bathroom by toilet. Roommate reports pt then fell again in hall (unwitnessed but heard). She then took pt BP and was 77/58. At 1015 BP 91/55 at home. She reports pt has been falling asleep and having diarrhea, vomiting. Pt also reports passing out in sister's house today in her kitchen, as well as passed out while two steps up on a ladder. Reports hitting head on a pole, denies being on blood thinners. Pt reports started feeling sick Friday.

## 2021-04-02 NOTE — ED Notes (Signed)
Left message for hospitalist to return call because pt want to leave

## 2021-04-02 NOTE — ED Provider Notes (Signed)
Emergency Department Provider Note   I have reviewed the triage vital signs and the nursing notes.   HISTORY  Chief Complaint No chief complaint on file.   HPI Swanson Farnell is a 70 y.o. male with past medical history reviewed below presents to the emergency department with intermittent syncope episodes over the past several days.  He states that he was working yesterday inside, out of the heat, drinking plenty of fluids, when he had 2 episodes of syncope.  He did not have heart palpitations or chest pain.  No shortness of breath.  No fevers or chills.  He notes that in the past several weeks his blood pressure medication was changed due to concern for some of his kidney labs.  He denies having a nephrologist or being told he needs dialysis.  He has been compliant with his medications.  He did have 3 episodes of diarrhea yesterday but nothing today.  He states that overall he is feeling well other than intermittent syncope.  Continues to urinate normally.  Past Medical History:  Diagnosis Date   Barrett's esophagus 2011   Recommend repeat EGD for surveillance in 2 yrs-[facility]   Bipolar disorder ([place])    Chronic kidney disease    Cirrhosis ([place])    Diverticulosis of colon    GANGLION CYST, TENDON SHEATH 06/26/2010   Hyperlipidemia    Hypertension    Hypertriglyceridemia 01/21/2019   Impotence    Migraine    Pancreatitis    Pulmonary nodule    Pulmonary nodule seen on imaging study 02/06/2011   Followup CT in December 2014 with 20 months of stability at 4 mm.  No further imaging recommended     Patient Active Problem List   Diagnosis Date Noted   AKI (acute kidney injury) ([place]) 04/02/2021   Subacromial bursitis of right shoulder joint 03/06/2021   Carpal tunnel syndrome, right 03/06/2021   Acute pain of right shoulder 02/27/2021   Right-sided chest wall pain 02/27/2021   Stage 3b chronic kidney disease ([place]) 02/27/2021   Primary osteoarthritis of right shoulder 02/27/2021    PAD (peripheral artery disease) ([place]) 09/06/2020   NSVT (nonsustained ventricular tachycardia) ([place]) 09/06/2020   Hypertriglyceridemia 01/21/2019   Radiculopathy of lumbar region 08/19/2017   Exercise-induced bronchospasm 11/11/2016   Migraine without aura and without status migrainosus, not intractable 12/20/2015   Constipation 05/06/2015   Neuropathic pain 04/19/2015   Tibialis posterior tendinitis 05/03/2013   Plaque psoriasis 08/24/2012   CAD (coronary artery disease) 03/28/2010   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 W/O OBSTRUCTION 11/26/2006   CIRRHOSIS, ALCOHOLIC 11/94/1740   PANCREATITIS, CHRONIC 11/26/2006   IMPOTENCE, ORGANIC 11/26/2006   Insomnia 11/26/2006    Past Surgical History:  Procedure Laterality Date   CARDIAC CATHETERIZATION  2005   Nonobstructive, <40%   TONSILLECTOMY Bilateral 1961    Allergies Other, Codeine phosphate, and Penicillins  Family History  Problem Relation Age of Onset   Anemia Mother    Hypertension Daughter    Diabetes Daughter    Migraines Daughter     Social History Social History   Tobacco Use   Smoking status: Light Smoker    Packs/day: 0.25    Years: 53.00    Pack  years: 13.25    Types: Cigars, Cigarettes    Start date: 09/29/1962    Last attempt to quit: 08/04/2016    Years since quitting: 4.6   Smokeless tobacco: Former    Quit date: 03/08/2017   Tobacco comments:    cigar occassionally   Vaping Use   Vaping Use: Never used  Substance Use Topics   Alcohol use: No    Comment: Last drink "30 years ago"   Drug use: Yes    Types: Marijuana    Comment: daily marijuana since teenager.     Review of Systems  Constitutional: No  fever/chills Eyes: No visual changes. ENT: No sore throat. Cardiovascular: Denies chest pain. Positive syncope.  Respiratory: Denies shortness of breath. Gastrointestinal: No abdominal pain.  No nausea, no vomiting.  No diarrhea.  No constipation. Genitourinary: Negative for dysuria. Musculoskeletal: Negative for back pain. Skin: Negative for rash. Neurological: Negative for headaches, focal weakness or numbness.  10-point ROS otherwise negative.  ____________________________________________   PHYSICAL EXAM:  VITAL SIGNS: ED Triage Vitals  Enc Vitals Group     BP 04/02/21 1314 (!) 78/57     Pulse Rate 04/02/21 1314 79     Resp 04/02/21 1314 20     Temp 04/02/21 1314 98.2 F (36.8 C)     Temp Source 04/02/21 1314 Oral     SpO2 04/02/21 1314 97 %    Constitutional: Alert and oriented. Well appearing and in no acute distress. Eyes: Conjunctivae are normal.  Head: Atraumatic. Nose: No congestion/rhinnorhea. Mouth/Throat: Mucous membranes are dry.  Neck: No stridor.  Cardiovascular: Normal rate, regular rhythm. Good peripheral circulation. Grossly normal heart sounds.   Respiratory: Normal respiratory effort.  No retractions. Lungs CTAB. Gastrointestinal: Soft and nontender. No distention.  Musculoskeletal: No lower extremity tenderness nor edema. No gross deformities of extremities. Neurologic:  Normal speech and language. No gross focal neurologic deficits are appreciated.  Skin:  Skin is warm, dry and intact. No rash noted.  ____________________________________________   LABS (all labs ordered are listed, but only abnormal results are displayed)  Labs Reviewed  COMPREHENSIVE METABOLIC PANEL - Abnormal; Notable for the following components:      Result Value   Sodium 127 (*)    Potassium 2.8 (*)    Chloride 95 (*)    CO2 18 (*)    Glucose, Bld 108 (*)    BUN 41 (*)    Creatinine, Ser 4.73 (*)    GFR, Estimated 13 (*)    All other components within normal limits   CBC WITH DIFFERENTIAL/PLATELET - Abnormal; Notable for the following components:   WBC 13.9 (*)    Neutro Abs 8.9 (*)    Monocytes Absolute 1.3 (*)    All other components within normal limits  I-STAT CHEM 8, ED - Abnormal; Notable for the following components:   Sodium 128 (*)    Potassium 2.7 (*)    BUN 38 (*)    Creatinine, Ser 5.00 (*)    Glucose, Bld 106 (*)    Calcium, Ion 1.10 (*)    TCO2 18 (*)    All other components within normal limits  RESP PANEL BY RT-PCR (FLU A&B, COVID) ARPGX2  LACTIC ACID, PLASMA  MAGNESIUM  URINALYSIS, ROUTINE W REFLEX MICROSCOPIC  OSMOLALITY  OSMOLALITY, URINE  SODIUM, URINE, RANDOM  TSH  CBG MONITORING, ED   ____________________________________________  EKG   EKG Interpretation  Date/Time:  Tuesday April 02 2021 14:32:20 EDT Ventricular Rate:  72 PR  Interval:  167 QRS Duration: 69 QT Interval:  389 QTC Calculation: 426 R Axis:   34 Text Interpretation: Sinus rhythm Ventricular premature complex Borderline repolarization abnormality Confirmed by Nanda Quinton [phone]) on 04/02/2021 3:07:46 PM         ____________________________________________   PROCEDURES  Procedure(s) performed:   Procedures  CRITICAL CARE Performed by: Margette Fast Total critical care time: 35 minutes Critical care time was exclusive of separately billable procedures and treating other patients. Critical care was necessary to treat or prevent imminent or life-threatening deterioration. Critical care was time spent personally by me on the following activities: development of treatment plan with patient and/or surrogate as well as nursing, discussions with consultants, evaluation of patient's response to treatment, examination of patient, obtaining history from patient or surrogate, ordering and performing treatments and interventions, ordering and review of laboratory studies, ordering and review of radiographic studies, pulse oximetry and re-evaluation of  patient's condition.  Nanda Quinton, MD Emergency Medicine  ____________________________________________   INITIAL IMPRESSION / ASSESSMENT AND PLAN / ED COURSE  Pertinent labs & imaging results that were available during my care of the patient were reviewed by me and considered in my medical decision making (see chart for details).   Patient presents to the emergency department with intermittent syncope.  He has hypotension on arrival but is fluid responsive.  Appears to be in new renal failure by labs with creatinine approaching 5 and is hypokalemic and hyponatremic.  He is awake and alert.  He has no focal neurologic deficits.  EKG interpreted by me as above.  Plan for IV fluids and admitted with acute on chronic renal failure.   Discussed the case with Dr. Candiss Norse.  He will consult with the acute kidney injury.  We will continue IV fluids.  Plan for medicine admit.   Discussed patient's case with [facility] to request admission. Patient and family (if present) updated with plan. Care transferred to [facility] service.  I reviewed all nursing notes, vitals, pertinent old records, EKGs, labs, imaging (as available).  ____________________________________________  FINAL CLINICAL IMPRESSION(S) / ED DIAGNOSES  Final diagnoses:  AKI (acute kidney injury) ([place])  Hypokalemia  Hyponatremia    MEDICATIONS GIVEN DURING THIS VISIT:  Medications  0.9 %  sodium chloride infusion ( Intravenous New Bag/Given 04/02/21 1435)    Note:  This document was prepared using Dragon voice recognition software and may include unintentional dictation errors.  Nanda Quinton, MD, [facility] Emergency Medicine    Arianni Gallego, W[facility], MD 04/02/21 1534

## 2021-04-02 NOTE — ED Provider Notes (Signed)
Nurse notified me of pt in triage to evaluate. Pt and wife express that pt has had episodes of syncope for the past few weeks now with some n/v. This am pt was sitting on porch and wife found him confused. She said he had emesis and she gave him tums and pepto. Pt is alert to person and place. Did tell staff 3 weeks ago pcp did change his bp meds. Denies any chest pain, no sob. Wife also states that he has had several falls. Ekg completed and bp was low while in triage. Expressed that pt needs a higher level of care and further evaluation. Pt would need to go to the ER. Wife states that her and her daughter would take him no ems was needed.    Marney Setting, NP 04/02/21 1435

## 2021-04-02 NOTE — H&P (Addendum)
History and Physical    Jaime Barnes KKX:[id number] DOB: 27-Jul-1951 DOA: 04/02/2021  PCP: Janith Lima, MD (Confirm with patient/family/NH records and if not entered, this has to be entered at [facility] point of entry) Patient coming from: Home  I have personally briefly reviewed patient's old medical records in [place]  Chief Complaint: I passed out.  HPI: Jaime Barnes is a 70 y.o. male with medical history significant of HTN, CKD stage II, HLD, nonobstructive CAD, cirrhosis, GERD, came with diarrhea and syncope.  Patient ate some leftover chicken noodle for dinner last night and then started to have severe diarrhea overnight, 4-5 bouts, initially associated with cramping-like abdominal pain.  Abdominal pain located on the lower abdomen, which resolved this morning.  However he continued to have diarrhea 2 episodes since this morning, watery, no mucus or blood.  No fever chills.  He also felt nauseous after eating the chicken noodle dinner and vomited 3 times stomach content last night, no more vomiting today.  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  Denied any prodrome such as lightheaded palpitations sweaty.  He was able to recover consciousness immediately. Hit his head while falling. His friend took his BP reading was 77/58.  ED Course: Blood pressure 78/57, blood work showed AKI creatinine 2.7, potassium 2.8 sodium 127 WBC 13.9.  IV fluids started in ED.  Review of Systems: As per HPI otherwise 14 point review of systems negative.    Past Medical History:  Diagnosis Date   Barrett's esophagus 2011   Recommend repeat EGD for surveillance in 2 yrs-[facility]   Bipolar disorder ([place])    Chronic kidney disease    Cirrhosis ([place])    Diverticulosis of colon    GANGLION CYST, TENDON SHEATH 06/26/2010   Hyperlipidemia    Hypertension    Hypertriglyceridemia 01/21/2019    Impotence    Migraine    Pancreatitis    Pulmonary nodule    Pulmonary nodule seen on imaging study 02/06/2011   Followup CT in December 2014 with 20 months of stability at 4 mm.  No further imaging recommended     Past Surgical History:  Procedure Laterality Date   CARDIAC CATHETERIZATION  2005   Nonobstructive, <40%   TONSILLECTOMY Bilateral 1961     reports that he has been smoking cigars. He started smoking about 58 years ago. He has a 13.25 pack-year smoking history. He quit smokeless tobacco use about 4 years ago. He reports current drug use. Drug: Marijuana. He reports that he does not drink alcohol.  Allergies  Allergen Reactions   Other     Other reaction(s): rash, sweat, nausea Other reaction(s): rash, sweat, nausea   Codeine Phosphate Other (See Comments)    REACTION: breathing difficulty   Penicillins Rash and Other (See Comments)    Break out in sweats/ Rash amoxicillin. Denies airway involvement Has patient had a PCN reaction causing immediate rash, facial/tongue/throat swelling, SOB or lightheadedness with hypotension: Yes Has patient had a PCN reaction causing severe rash involving mucus membranes or skin necrosis: Yes Has patient had a PCN reaction that required hospitalization No Has patient had a PCN reaction occurring within the last 10 years: No If all of the above answers are "NO", then may proceed with Cephalosporin u    Family History  Problem Relation Age of Onset   Anemia Mother  Hypertension Daughter    Diabetes Daughter    Migraines Daughter      Prior to Admission medications   Medication Sig Start Date End Date Taking? Authorizing Provider  albuterol (VENTOLIN HFA) 108 (90 Base) MCG/ACT inhaler Inhale 1-2 puffs into the lungs every 6 (six) hours as needed for wheezing or shortness of breath. 03/06/21   Janith Lima, MD  amLODipine (NORVASC) 10 MG tablet TAKE 1 TABLET(10 MG) BY MOUTH DAILY 09/06/20   Just, Laurita Quint, FNP  aspirin 81 MG  chewable tablet Chew 81 mg by mouth daily.    [provider]  atorvastatin (LIPITOR) 40 MG tablet 1 tablet    [provider]  gabapentin (NEURONTIN) 300 MG capsule Take 1 capsule (300 mg total) by mouth 2 (two) times daily. 02/27/21   Janith Lima, MD  lisinopril-hydrochlorothiazide (ZESTORETIC) 20-12.5 MG tablet Take 1 tablet by mouth daily. 02/21/21   Skeet Latch, MD  MAGNESIUM-OXIDE 400 (241.3 Mg) MG tablet TAKE 1 TABLET BY MOUTH EVERY DAY 01/22/21   Skeet Latch, MD  metoprolol succinate (TOPROL-XL) 25 MG 24 hr tablet TAKE 1 TABLET(25 MG) BY MOUTH DAILY 09/06/20   Just, Laurita Quint, FNP  omeprazole (PRILOSEC) 40 MG capsule Take 1 capsule (40 mg total) by mouth daily. 02/27/21   Janith Lima, MD  Polyethylene Glycol 3350 (GLYCOLAX PO) 17g (1 capful) 12/13/15   [provider]  risperiDONE (RISPERDAL) 1 MG tablet Take 1 tablet (1 mg total) by mouth at bedtime. 02/27/21   Janith Lima, MD  sildenafil (VIAGRA) 100 MG tablet TAKE 1/2 (ONE-HALF) TABLET BY MOUTH ONCE DAILY AS NEEDED FOR  ERECTILE  DYSFUNCTION 10/05/20   Just, Laurita Quint, FNP  topiramate (TOPAMAX) 50 MG tablet 1 tablet    [provider]  traMADol (ULTRAM) 50 MG tablet Take 1 tablet (50 mg total) by mouth every 12 (twelve) hours as needed. 02/27/21   Janith Lima, MD  traZODone (DESYREL) 100 MG tablet Take 0.5-1 tablets (50-100 mg total) by mouth at bedtime. 12/03/20   Just, Laurita Quint, FNP  Ubrogepant (UBRELVY) 100 MG TABS TAKE 1 TABLET(100 MG) BY MOUTH 1 TIME EVERY 2 HOURS AS DIRECTED AS NEEDED. MAXIMUM 2 TABLETS IN 24 HOURS 11/05/20   Tomi Likens, Adam R, DO  valACYclovir (VALTREX) 500 MG tablet Take 1 tablet (500 mg total) by mouth 2 (two) times daily. 06/11/20   Daleen Squibb, MD    Physical Exam: Vitals:   04/02/21 1345 04/02/21 1400 04/02/21 1415 04/02/21 1430  BP: (!) 87/65 116/80 109/77 106/72  Pulse: 72 75  76  Resp: (!) 22 (!) 22 20 (!) 21  Temp:      TempSrc:      SpO2: 99% 93%   99%    Constitutional: NAD, calm, comfortable Vitals:   04/02/21 1345 04/02/21 1400 04/02/21 1415 04/02/21 1430  BP: (!) 87/65 116/80 109/77 106/72  Pulse: 72 75  76  Resp: (!) 22 (!) 22 20 (!) 21  Temp:      TempSrc:      SpO2: 99% 93%  99%   Eyes: PERRL, lids and conjunctivae normal ENMT: Mucous membranes are dry. Posterior pharynx clear of any exudate or lesions.Normal dentition.  Neck: normal, supple, no masses, no thyromegaly Respiratory: clear to auscultation bilaterally, no wheezing, no crackles. Normal respiratory effort. No accessory muscle use.  Cardiovascular: Regular rate and rhythm, no murmurs / rubs / gallops. No extremity edema. 2+ pedal pulses. No carotid bruits.  Abdomen: no tenderness, no masses palpated. No hepatosplenomegaly. Bowel sounds positive.  Musculoskeletal: no clubbing / cyanosis. No joint deformity upper and lower extremities. Good ROM, no contractures. Normal muscle tone.  Skin: no rashes, lesions, ulcers. No induration Neurologic: CN 2-12 grossly intact. Sensation intact, DTR normal. Strength 5/5 in all 4.  Psychiatric: Normal judgment and insight. Alert and oriented x 3. Normal mood.    Labs on Admission: I have personally reviewed following labs and imaging studies  CBC: Recent Labs  Lab 04/02/21 1359 04/02/21 1403  WBC 13.9*  --   NEUTROABS 8.9*  --   HGB 14.1 14.6  HCT 41.4 43.0  MCV 91.8  --   PLT 247  --    Basic Metabolic Panel: Recent Labs  Lab 04/02/21 1359 04/02/21 1403  NA 127* 128*  K 2.8* 2.7*  CL 95* 98  CO2 18*  --   GLUCOSE 108* 106*  BUN 41* 38*  CREATININE 4.73* 5.00*  CALCIUM 9.1  --   MG 1.9  --    GFR: CrCl cannot be calculated (Unknown ideal weight.). Liver Function Tests: Recent Labs  Lab 04/02/21 1359  AST 18  ALT 13  ALKPHOS 62  BILITOT 1.0  PROT 6.8  ALBUMIN 3.9   No results for input(s): LIPASE, AMYLASE in the last 168 hours. No results for input(s): AMMONIA in the last 168  hours. Coagulation Profile: No results for input(s): INR, PROTIME in the last 168 hours. Cardiac Enzymes: No results for input(s): CKTOTAL, CKMB, CKMBINDEX, TROPONINI in the last 168 hours. BNP (last 3 results) No results for input(s): PROBNP in the last 8760 hours. HbA1C: No results for input(s): HGBA1C in the last 72 hours. CBG: Recent Labs  Lab 04/02/21 1428  GLUCAP 92   Lipid Profile: No results for input(s): CHOL, HDL, LDLCALC, TRIG, CHOLHDL, LDLDIRECT in the last 72 hours. Thyroid Function Tests: No results for input(s): TSH, T4TOTAL, FREET4, T3FREE, THYROIDAB in the last 72 hours. Anemia Panel: No results for input(s): VITAMINB12, FOLATE, FERRITIN, TIBC, IRON, RETICCTPCT in the last 72 hours. Urine analysis:    Component Value Date/Time   COLORURINE AMBER (A) 12/08/2014 0912   APPEARANCEUR Clear 06/25/2018 1642   LABSPEC 1.023 12/08/2014 0912   PHURINE 8.0 12/08/2014 0912   GLUCOSEU Negative 06/25/2018 1642   HGBUR NEGATIVE 12/08/2014 0912   HGBUR negative 03/16/2008 0932   BILIRUBINUR Negative 06/25/2018 1642   KETONESUR negative 03/19/2018 1557   KETONESUR 15 (A) 12/08/2014 0912   PROTEINUR 1+ (A) 06/25/2018 1642   PROTEINUR 30 (A) 12/08/2014 0912   UROBILINOGEN 0.2 03/19/2018 1557   UROBILINOGEN 1.0 12/08/2014 0912   NITRITE Negative 06/25/2018 1642   NITRITE NEGATIVE 12/08/2014 0912   LEUKOCYTESUR Negative 06/25/2018 1642    Radiological Exams on Admission: No results found.  EKG: Independently reviewed. Sinus, frequent PVCs, no PR or QTc interval show.  Assessment/Plan Active Problems:   AKI (acute kidney injury) ([place])  (please populate well all problems here in Problem List. (For example, if patient is on BP meds at home and you resume or decide to hold them, it is a problem that needs to be her. Same for CAD, COPD, HLD and so on)  Recurrent syncope -Probably combined effect of hypovolemia/hypotension plus acute hypokalemia -Check Head CT -Continue  high rate of IV fluids.  Replace potassium p.o.  Hypovolemia and hypotension -Volume depleted, continue IV fluid as above.  AKI on CKD stage II -Likely prerenal from volume from repeated diarrhea -IV fluid, reevaluation  in the a.m. -Nephrology consulted in ED.  Hypokalemia -P.o. replacement x2 recheck level tonight. -Magnesium 1.9.  Hyponatremia -Hypovolemia, will place volume then reevaluate.  None anion gap Metabolic acidosis -Secondary to diarrhea and AKI, start bicarb pills.  Acute diarrhea -Likely food toxin, improving, on IV fluid.  Leukocytosis -Likely reactive to food toxin/diarrhea, monitor off antibiotics.  Abdominal pain resolved, hold abdominal imaging for now.  Frequent PVCs -Probably secondary to hypokalemia, replace K and then reevaluate.  HTN -Hold all BP meds  Chronic pain syndrome -Continue gabapentin, trazodone and risperidone, avoid NSAIDs.  DVT prophylaxis: Heparin subcu Code Status: Full code Family Communication: None at bedside Disposition Plan: Expect 1 to 2 days hospital stay Consults called: Nephrology Admission status: Tele admit   Lequita Halt MD [facility] Pager [phone]  04/02/2021, 3:40 PM

## 2021-04-02 NOTE — ED Notes (Signed)
Attempted to give reportx1 

## 2021-04-02 NOTE — ED Notes (Addendum)
Reports NVD since Friday. NVD has resolved. Reports syncope x2 this am. Associated with light headedness, and low BP.

## 2021-04-02 NOTE — ED Notes (Signed)
PT wants to leave AMA and not be admitted. EDP, admitting and nephrology notified.

## 2021-04-02 NOTE — Consult Note (Signed)
Nephrology Consult   Requesting provider: Lequita Halt, MD  Assessment/Recommendations:  AKI : Likely secondary to hemodynamic insult/relative hypotension/pre-renal injury in the context of ACEI and thiazide. BL Cr around 1.3-1.6 (underlying CKD likely related to HTN) -UA pending -check renal u/s -hold ACEI and HCTZ -agree with volume resuscitation (see below for hypoNa), bolus now for 1L -Continue to monitor daily Cr, Dose meds for GFR<15 -Monitor Daily I/Os, Daily weight  -Maintain MAP>65 for optimal renal perfusion.  -Agree with holding ACE-I, avoid further nephrotoxins including NSAIDS, Morphine.  Unless absolutely necessary, avoid CT with contrast and/or MRI with gadolinium.     Hyponatremia -likely related to HCTZ, low solute intake, AKI -check serum osm, urine osm, urine na, tsh to start with -given that he is on isotonic fluids, would recommend checking serial Na to make sure that this is not a SIADH picture  Hypokalemia -replete prn  Metabolic acidosis 2/2 AKI -start nahco3 tabs  Hypertension: -hold anti-HTN's for now, will need to resume slowly  [facility] 04/02/2021 3:17 PM   _____________________________________________________________________________________   History of Present Illness: Jaime Barnes is a/an 70 y.o. male with a past medical history of CKD, hypertension, PAD, tobacco abuse, history of NSVT, hyperlipidemia, cirrhosis who presents to [facility] with intermittent syncope.  Has also been having some nausea and vomiting.  Did have couple episodes of diarrhea yesterday as well.  Patient's lisinopril was increased about a month ago and since then he has been having orthostatic dizziness.  Reports lightheadedness every time he tries to get up.  All of the symptoms really started worsening Friday onwards.  Since then, he has not been able to eat as well and his only been drinking water.  He occasionally did eat chicken noodle soup,  crackers, and applesauce but has mainly been drinking water.  He does report that he has been urinating normally over the last couple of days.  BP 77/52 on presentation here.  He currently denies any fevers, chills, chest pain, shortness of breath, swelling, change in urinary frequency, nausea/vomiting, dysgeusia, loss of appetite, hiccups, brain fog, pruritus.  Medications:  Current Facility-Administered Medications  Medication Dose Route Frequency Provider Last Rate Last Admin   0.9 %  sodium chloride infusion   Intravenous Continuous Long, Wonda Olds, MD 150 mL/hr at 04/02/21 1435 New Bag at 04/02/21 1435   Current Outpatient Medications  Medication Sig Dispense Refill   albuterol (VENTOLIN HFA) 108 (90 Base) MCG/ACT inhaler Inhale 1-2 puffs into the lungs every 6 (six) hours as needed for wheezing or shortness of breath. 18 g 3   amLODipine (NORVASC) 10 MG tablet TAKE 1 TABLET(10 MG) BY MOUTH DAILY 90 tablet 3   aspirin 81 MG chewable tablet Chew 81 mg by mouth daily.     atorvastatin (LIPITOR) 40 MG tablet 1 tablet     gabapentin (NEURONTIN) 300 MG capsule Take 1 capsule (300 mg total) by mouth 2 (two) times daily. 180 capsule 1   lisinopril-hydrochlorothiazide (ZESTORETIC) 20-12.5 MG tablet Take 1 tablet by mouth daily. 90 tablet 3   MAGNESIUM-OXIDE 400 (241.3 Mg) MG tablet TAKE 1 TABLET BY MOUTH EVERY DAY 30 tablet 0   metoprolol succinate (TOPROL-XL) 25 MG 24 hr tablet TAKE 1 TABLET(25 MG) BY MOUTH DAILY 90 tablet 3   omeprazole (PRILOSEC) 40 MG capsule Take 1 capsule (40 mg total) by mouth daily. 90 capsule 1   Polyethylene Glycol 3350 (GLYCOLAX PO) 17g (1 capful)     risperiDONE (RISPERDAL) 1  MG tablet Take 1 tablet (1 mg total) by mouth at bedtime. 90 tablet 1   sildenafil (VIAGRA) 100 MG tablet TAKE 1/2 (ONE-HALF) TABLET BY MOUTH ONCE DAILY AS NEEDED FOR  ERECTILE  DYSFUNCTION 30 tablet 2   topiramate (TOPAMAX) 50 MG tablet 1 tablet     traMADol (ULTRAM) 50 MG tablet Take 1 tablet  (50 mg total) by mouth every 12 (twelve) hours as needed. 60 tablet 2   traZODone (DESYREL) 100 MG tablet Take 0.5-1 tablets (50-100 mg total) by mouth at bedtime. 60 tablet 1   Ubrogepant (UBRELVY) 100 MG TABS TAKE 1 TABLET(100 MG) BY MOUTH 1 TIME EVERY 2 HOURS AS DIRECTED AS NEEDED. MAXIMUM 2 TABLETS IN 24 HOURS 16 tablet 5   valACYclovir (VALTREX) 500 MG tablet Take 1 tablet (500 mg total) by mouth 2 (two) times daily. 6 tablet 3     ALLERGIES Other, Codeine phosphate, and Penicillins  MEDICAL HISTORY Past Medical History:  Diagnosis Date   Barrett's esophagus 2011   Recommend repeat EGD for surveillance in 2 yrs-[facility]   Bipolar disorder ([place])    Chronic kidney disease    Cirrhosis ([place])    Diverticulosis of colon    GANGLION CYST, TENDON SHEATH 06/26/2010   Hyperlipidemia    Hypertension    Hypertriglyceridemia 01/21/2019   Impotence    Migraine    Pancreatitis    Pulmonary nodule    Pulmonary nodule seen on imaging study 02/06/2011   Followup CT in December 2014 with 20 months of stability at 4 mm.  No further imaging recommended      SOCIAL HISTORY Social History   Socioeconomic History   Marital status: Legally Separated    Spouse name: Not on file   Number of children: 8   Years of education: 8   Highest education level: 8th grade  Occupational History   Not on file  Tobacco Use   Smoking status: Light Smoker    Packs/day: 0.25    Years: 53.00    Pack years: 13.25    Types: Cigars, Cigarettes    Start date: 09/29/1962    Last attempt to quit: 08/04/2016    Years since quitting: 4.6   Smokeless tobacco: Former    Quit date: 03/08/2017   Tobacco comments:    cigar occassionally   Vaping Use   Vaping Use: Never used  Substance and Sexual Activity   Alcohol use: No    Comment: Last drink "30 years ago"   Drug use: Yes    Types: Marijuana    Comment: daily marijuana since teenager.    Sexual activity: Yes    Birth control/protection: Condom    Comment:  Multiple sex partners. 60 in last year.  Other Topics Concern   Not on file  Social History Narrative   Pt lives alone. No stairs in home. Pt has completed 8th grade.       Caffeine 3 cups coffee day      Exercise - yes walks and plays tennis   Social Determinants of Health   Financial Resource Strain: Not on file  Food Insecurity: Not on file  Transportation Needs: Not on file  Physical Activity: Not on file  Stress: Not on file  Social Connections: Not on file  Intimate Partner Violence: Not on file     FAMILY HISTORY Family History  Problem Relation Age of Onset   Anemia Mother    Hypertension Daughter    Diabetes Daughter  Migraines Daughter      Review of Systems: 12 systems reviewed Otherwise as per HPI, all other systems reviewed and negative  Physical Exam: Vitals:   04/02/21 1415 04/02/21 1430  BP: 109/77 106/72  Pulse:  76  Resp: 20 (!) 21  Temp:    SpO2:  99%   Total I/O In: 1000 [I.V.:1000] Out: -   Intake/Output Summary (Last 24 hours) at 04/02/2021 1517 Last data filed at 04/02/2021 1435 Gross per 24 hour  Intake 1000 ml  Output --  Net 1000 ml   General: well-appearing, no acute distress HEENT: anicteric sclera, oropharynx clear without lesions CV: regular rate, normal rhythm, no murmurs, no gallops, no rubs, no peripheral edema Lungs: clear to auscultation bilaterally, normal work of breathing Abd: soft, non-tender, non-distended Skin: no visible lesions or rashes Psych: alert, engaged, appropriate mood and affect Musculoskeletal: no obvious deformities Neuro: normal speech, no gross focal deficits   Test Results Reviewed Lab Results  Component Value Date   NA 128 (L) 04/02/2021   K 2.7 (LL) 04/02/2021   CL 98 04/02/2021   CO2 18 (L) 04/02/2021   BUN 38 (H) 04/02/2021   CREATININE 5.00 (H) 04/02/2021   GFR 66.97 06/23/2016   CALCIUM 9.1 04/02/2021   ALBUMIN 3.9 04/02/2021     I have reviewed all relevant outside healthcare  records related to the patient's kidney injury.

## 2021-04-02 NOTE — ED Notes (Signed)
Patient is being discharged from the Urgent Care and sent to the Emergency Department via POV. Per Alroy Dust NP, patient is in need of higher level of care due to hypotension, possible syncope. Patient is aware and verbalizes understanding of plan of care.  Vitals:   04/02/21 1232  BP: (!) 77/53  Pulse: 74  Resp: 18  Temp: 98.7 F (37.1 C)  SpO2: 95%

## 2021-04-02 NOTE — ED Triage Notes (Signed)
Patient has been dizzy intermittent for 2 days. States that he has not had any of these problems until his BP meds were switched 2-3 weeks ago. No SOB, no pain but complains of multiple falls the past 2 days. Alert and oriented on arrival

## 2021-04-03 ENCOUNTER — Ambulatory Visit: Payer: 59 | Admitting: Podiatry

## 2021-04-03 DIAGNOSIS — I10 Essential (primary) hypertension: Secondary | ICD-10-CM

## 2021-04-03 DIAGNOSIS — E872 Acidosis: Secondary | ICD-10-CM | POA: Diagnosis not present

## 2021-04-03 DIAGNOSIS — D649 Anemia, unspecified: Secondary | ICD-10-CM | POA: Diagnosis not present

## 2021-04-03 DIAGNOSIS — N179 Acute kidney failure, unspecified: Secondary | ICD-10-CM | POA: Diagnosis not present

## 2021-04-03 DIAGNOSIS — E876 Hypokalemia: Secondary | ICD-10-CM

## 2021-04-03 DIAGNOSIS — I129 Hypertensive chronic kidney disease with stage 1 through stage 4 chronic kidney disease, or unspecified chronic kidney disease: Secondary | ICD-10-CM | POA: Diagnosis not present

## 2021-04-03 DIAGNOSIS — N1831 Chronic kidney disease, stage 3a: Secondary | ICD-10-CM | POA: Diagnosis not present

## 2021-04-03 DIAGNOSIS — E871 Hypo-osmolality and hyponatremia: Secondary | ICD-10-CM | POA: Diagnosis not present

## 2021-04-03 DIAGNOSIS — R55 Syncope and collapse: Secondary | ICD-10-CM | POA: Diagnosis not present

## 2021-04-03 LAB — CBC WITH DIFFERENTIAL/PLATELET
Abs Immature Granulocytes: 0.03 10*3/uL (ref 0.00–0.07)
Basophils Absolute: 0 10*3/uL (ref 0.0–0.1)
Basophils Relative: 0 %
Eosinophils Absolute: 0.1 10*3/uL (ref 0.0–0.5)
Eosinophils Relative: 2 %
HCT: 34.7 % — ABNORMAL LOW (ref 39.0–52.0)
Hemoglobin: 12.2 g/dL — ABNORMAL LOW (ref 13.0–17.0)
Immature Granulocytes: 0 %
Lymphocytes Relative: 31 %
Lymphs Abs: 2.7 10*3/uL (ref 0.7–4.0)
MCH: 31 pg (ref 26.0–34.0)
MCHC: 35.2 g/dL (ref 30.0–36.0)
MCV: 88.1 fL (ref 80.0–100.0)
Monocytes Absolute: 0.7 10*3/uL (ref 0.1–1.0)
Monocytes Relative: 8 %
Neutro Abs: 5.2 10*3/uL (ref 1.7–7.7)
Neutrophils Relative %: 59 %
Platelets: 204 10*3/uL (ref 150–400)
RBC: 3.94 MIL/uL — ABNORMAL LOW (ref 4.22–5.81)
RDW: 12.8 % (ref 11.5–15.5)
WBC: 8.8 10*3/uL (ref 4.0–10.5)
nRBC: 0 % (ref 0.0–0.2)

## 2021-04-03 LAB — BASIC METABOLIC PANEL
Anion gap: 5 (ref 5–15)
BUN: 33 mg/dL — ABNORMAL HIGH (ref 8–23)
CO2: 20 mmol/L — ABNORMAL LOW (ref 22–32)
Calcium: 8.6 mg/dL — ABNORMAL LOW (ref 8.9–10.3)
Chloride: 110 mmol/L (ref 98–111)
Creatinine, Ser: 2.2 mg/dL — ABNORMAL HIGH (ref 0.61–1.24)
GFR, Estimated: 31 mL/min — ABNORMAL LOW (ref >60)
Glucose, Bld: 101 mg/dL — ABNORMAL HIGH (ref 70–99)
Potassium: 3.2 mmol/L — ABNORMAL LOW (ref 3.5–5.1)
Sodium: 135 mmol/L (ref 135–145)

## 2021-04-03 LAB — MAGNESIUM: Magnesium: 1.9 mg/dL (ref 1.7–2.4)

## 2021-04-03 MED ORDER — POTASSIUM CHLORIDE CRYS ER 20 MEQ PO TBCR
40.0000 meq | EXTENDED_RELEASE_TABLET | Freq: Once | ORAL | Status: AC
  Administered 2021-04-03: 40 meq via ORAL
  Filled 2021-04-03: qty 2

## 2021-04-03 MED ORDER — MAGNESIUM SULFATE 2 GM/50ML IV SOLN
2.0000 g | Freq: Once | INTRAVENOUS | Status: AC
  Administered 2021-04-03: 2 g via INTRAVENOUS
  Filled 2021-04-03: qty 50

## 2021-04-03 NOTE — Care Management CC44 (Signed)
Condition Code 44 Documentation Completed  Patient Details  Name: Jaime Barnes MRN: [id number] Date of Birth: [birth year]   Condition Code 44 given:  Yes Patient signature on Condition Code 44 notice:  Yes Documentation of 2 MD's agreement:  Yes Code 44 added to claim:  Yes    Marilu Favre, RN 04/03/2021, 12:14 PM

## 2021-04-03 NOTE — Discharge Summary (Signed)
Physician Discharge Summary  Jaime Barnes BPZ:[id number] DOB: Dec 08, 1950 DOA: 04/02/2021  PCP: Janith Lima, MD  Admit date: 04/02/2021 Discharge date: 04/03/2021  Admitted From: Home  Disposition:  Home   Recommendations for Outpatient Follow-up:  Follow up with PCP Dr. Ronnald Ramp in 1 week Dr. Ronnald Ramp: Please obtain BMP next Monday Dr. Ronnald Ramp: Please resume antihypertensives as needed Dr. Ronnald Ramp: Please re-adjust topiramate and gabapentin dosing pending renal function      Home Health: None  Equipment/Devices: None new  Discharge Condition: Good  CODE STATUS: FULL Diet recommendation: Regular  Brief/Interim Summary: Jaime Barnes is a 70 y.o. M with HTN, CKD IIIb baseline Cr 1.5/1.7 follows with nephrology, and cirrhosis, who presented with vomiting and passing out found to have AKI.  Patient was in his usual state of health until before admission when he had a hot dog, immediately tasted foul, and within a short time he started vomiting.  He had abdominal cramps and frequent diarrhea ovenright, passed out twice, and kept vomiting, until he syncopized twice and was dizzy with standing.  In the ED here his initial blood pressure 78/57, creatinine was greater than 5, potassium was low.  He was started on IV fluids, IV potassium and admitted.       PRINCIPAL HOSPITAL DIAGNOSIS: Acute kidney injury    Discharge Diagnoses:  Acute kidney injury on CKD stage IIIb Non-anion gap metabolic acidosis due to diarrhea Started on fluids.  ACE inhibitor and HCTZ were held.  His urinalysis was notable for bland casts, relatively low urine sodium.  His creatinine improved faster than expected, and is down to 2.2 by the morning.  He was making light yellow urine, with good volume, and felt completely better without vomiting or dizziness.    He has reliable PCP access and so was discharged with close PCP follow-up for labs and resumption of ACEi.  Lisinopril, HCTZ held at d/c.     Hyponatremia Due  to hypovolemia, mild asymptomatic.  Hypokalemia Hypomagnesemia Replaced and improved.  Repeat in 1 week  Hypertension Blood pressure improved to normal with IV fluids.  Given his hypotension on admission, amlodipine and metoprolol (in addition to lisinopril and HCTZ ) were recommended to be held until PCP follow-up.  Cirrhosis Compensated  Syncope This was related to hypovolemia, and he was asymptomatic at time of discharge.         Discharge Instructions  Discharge Instructions     Diet - low sodium heart healthy   Complete by: As directed    Discharge instructions   Complete by: As directed    From Dr. Loleta Books: You were admitted for temporary kidney failure. You probably had food poisoning from the hot dog, or something else, and it caused you to throw up and have diarrhea so much you got dehydrated and your blood pressure dropped Low blood pressure and dehydration cause cause temporary kidney injury.  Thankfully, it nearly always resolves, as it did in your case.  Take it easy for the next few days Drink plenty of fluids Drink at least eight 8oz glasses of water per day, and then some, until your urine is very light yellow  Here, we noticed that your blood pressure is still slightly low, even off your blood pressure medicines  For now, do NOT take the following medicines: Do not take lisinopril-HCTZ Do not take amlodipine Do not take metoprolol  Do NOT take any NSAIDs for pain (like ibuprofen, etc)  Also, for the next few days, reduce your gabapentin  to once daily at night and cut your topiramate/Topamax headache medicine in half   Go see Dr. Ronnald Ramp first thing next week and have him check your kidney function Ask him if he should restart those medicines  (Over the weekend, if your blood pressure goes up and is consistently OVER 160/90, you may restart metoprolol and amlodipine)   Increase activity slowly   Complete by: As directed       Allergies as of  04/03/2021       Reactions   Other    Other reaction(s): rash, sweat, nausea Other reaction(s): rash, sweat, nausea   Codeine Phosphate Other (See Comments)   REACTION: breathing difficulty   Penicillins Rash, Other (See Comments)   Break out in sweats/ Rash amoxicillin. Denies airway involvement Has patient had a PCN reaction causing immediate rash, facial/tongue/throat swelling, SOB or lightheadedness with hypotension: Yes Has patient had a PCN reaction causing severe rash involving mucus membranes or skin necrosis: Yes Has patient had a PCN reaction that required hospitalization No Has patient had a PCN reaction occurring within the last 10 years: No If all of the above answers are "NO", then may proceed with Cephalosporin u        Medication List     STOP taking these medications    amLODipine 10 MG tablet Commonly known as: NORVASC   lisinopril-hydrochlorothiazide 20-12.5 MG tablet Commonly known as: Zestoretic   metoprolol succinate 25 MG 24 hr tablet Commonly known as: TOPROL-XL       TAKE these medications    albuterol 108 (90 Base) MCG/ACT inhaler Commonly known as: VENTOLIN HFA Inhale 1-2 puffs into the lungs every 6 (six) hours as needed for wheezing or shortness of breath.   aspirin 81 MG chewable tablet Chew 81 mg by mouth daily.   atorvastatin 80 MG tablet Commonly known as: LIPITOR Take 80 mg by mouth daily.   gabapentin 300 MG capsule Commonly known as: NEURONTIN Take 1 capsule (300 mg total) by mouth 2 (two) times daily.   omeprazole 40 MG capsule Commonly known as: PRILOSEC Take 1 capsule (40 mg total) by mouth daily.   risperiDONE 1 MG tablet Commonly known as: RISPERDAL Take 1 tablet (1 mg total) by mouth at bedtime.   topiramate 50 MG tablet Commonly known as: TOPAMAX 1 tablet   traMADol 50 MG tablet Commonly known as: ULTRAM Take 1 tablet (50 mg total) by mouth every 12 (twelve) hours as needed.   traZODone 100 MG  tablet Commonly known as: DESYREL Take 0.5-1 tablets (50-100 mg total) by mouth at bedtime.   valACYclovir 500 MG tablet Commonly known as: VALTREX Take 1 tablet (500 mg total) by mouth 2 (two) times daily.       ASK your doctor about these medications    MAGnesium-Oxide 400 (241.3 Mg) MG tablet Generic drug: magnesium oxide TAKE 1 TABLET BY MOUTH EVERY DAY   sildenafil 100 MG tablet Commonly known as: VIAGRA TAKE 1/2 (ONE-HALF) TABLET BY MOUTH ONCE DAILY AS NEEDED FOR  ERECTILE  DYSFUNCTION   Ubrelvy 100 MG Tabs Generic drug: Ubrogepant TAKE 1 TABLET(100 MG) BY MOUTH 1 TIME EVERY 2 HOURS AS DIRECTED AS NEEDED. MAXIMUM 2 TABLETS IN 24 HOURS        Follow-up Information     Janith Lima, MD. Schedule an appointment as soon as possible for a visit in 1 week(s).   Specialty: Internal Medicine Contact information: [place] [address] [phone]  Allergies  Allergen Reactions   Other     Other reaction(s): rash, sweat, nausea Other reaction(s): rash, sweat, nausea   Codeine Phosphate Other (See Comments)    REACTION: breathing difficulty   Penicillins Rash and Other (See Comments)    Break out in sweats/ Rash amoxicillin. Denies airway involvement Has patient had a PCN reaction causing immediate rash, facial/tongue/throat swelling, SOB or lightheadedness with hypotension: Yes Has patient had a PCN reaction causing severe rash involving mucus membranes or skin necrosis: Yes Has patient had a PCN reaction that required hospitalization No Has patient had a PCN reaction occurring within the last 10 years: No If all of the above answers are "NO", then may proceed with Cephalosporin u    Consultations: Nephrology   Procedures/Studies: DG Chest 1 View  Result Date: 04/02/2021 CLINICAL DATA:  Per roommate pt woke up and found himself on floor today in bathroom by toilet. Roommate reports pt then fell again in hall, no  complaints at this time EXAM: CHEST  1 VIEW COMPARISON:  Chest x-ray 02/27/2021, CT chest 09/27/2020. FINDINGS: The heart size and mediastinal contours are unchanged. Aortic calcification Nodular density overlying the peripheral right mid lung zone. Query peripheral hazy airspace opacity within the left mid lung zone versus overlying soft tissues. Slightly coarsened interstitial markings with no overt pulmonary edema. No pleural effusion. No pneumothorax. No acute osseous abnormality. IMPRESSION: 1. Nodular density overlying the peripheral right mid lung zone. Recommend CT chest for further evaluation. 2. Query peripheral hazy airspace opacity within the left mid lung zone versus overlying soft tissues. Electronically Signed   By: Iven Finn M.D.   On: 04/02/2021 16:18   CT HEAD WO CONTRAST  Result Date: 04/02/2021 CLINICAL DATA:  Recurrent syncope. EXAM: CT HEAD WITHOUT CONTRAST TECHNIQUE: Contiguous axial images were obtained from the base of the skull through the vertex without intravenous contrast. COMPARISON:  Head CT 07/19/2015 FINDINGS: Brain: No intracranial hemorrhage, mass effect, or midline shift. Normal for age atrophy. No hydrocephalus. Incidental cavum septum pellucidum. The basilar cisterns are patent. Minimal periventricular chronic small vessel ischemia. No evidence of territorial infarct or acute ischemia. No extra-axial or intracranial fluid collection. Vascular: Atherosclerosis of skullbase vasculature without hyperdense vessel or abnormal calcification. Skull: No fracture or focal lesion. Sinuses/Orbits: Paranasal sinuses and mastoid air cells are clear. The visualized orbits are unremarkable. Other: None. IMPRESSION: 1. No acute intracranial abnormality. 2. Normal for age atrophy and minimal chronic small vessel ischemia. Electronically Signed   By: Keith Rake M.D.   On: 04/02/2021 16:45   [place] LIMITED JOINT SPACE STRUCTURES UP RIGHT(NO LINKED CHARGES)  Result Date:  03/07/2021 Formatting of this result is different from the original. Diagnostic Limited MSK Ultrasound of:  Right Wirst Right wrist:: Carpal tunnel visualized.  Median nerve Enlarged measuring 15 mm cross-sectional area Impression: Mild to moderate carpal tunnel syndrome.     Procedure: Real-time Ultrasound Guided Injection of right shoulder subacromial bursa Device: Philips Affiniti 50G Images permanently stored and available for review in PACS Ultrasound evaluation prior to injection Right shoulder and wrist Right shoulder: Biceps tendon is intact.  Small amount of hypoechoic fluid tracks within tendon sheath indicating tenosynovitis. Subscapularis tendon is intact. Supraspinatus tendon is intact with subacromial bursitis present. Infraspinatus tendon is intact. AC joint degenerative with calcifications. Verbal informed consent obtained.  Discussed risks and benefits of procedure. Warned about infection bleeding damage to structures skin hypopigmentation and fat atrophy among others. Patient expresses understanding and agreement Time-out conducted.  Noted no overlying erythema, induration, or other signs of local infection.   Skin prepped in a sterile fashion.   Local anesthesia: Topical Ethyl chloride.   With sterile technique and under real time ultrasound guidance:  40 mg of Kenalog and 2 mL of Marcaine injected into right shoulder subacromial bursa. Fluid seen entering the bursa.   Completed without difficulty   Pain immediately resolved suggesting accurate placement of the medication.   Advised to call if fevers/chills, erythema, induration, drainage, or persistent bleeding.   Images permanently stored and available for review in the ultrasound unit. Impression: Technically successful ultrasound guided injection.     Subjective: No headache, dizziness, confusion, passing out, chest pain, dyspnea, abdominal pain.  Still some loose stools.  Discharge Exam: Vitals:   04/03/21 0010 04/03/21 0416  BP:  (!) 93/57 107/68  Pulse: 80 72  Resp: 19 17  Temp: 98.6 F (37 C) 98.4 F (36.9 C)  SpO2: 96% 94%   Vitals:   04/02/21 2126 04/03/21 0010 04/03/21 0416 04/03/21 0456  BP: 128/74 (!) 93/57 107/68   Pulse: 72 80 72   Resp:  19 17   Temp: 97.9 F (36.6 C) 98.6 F (37 C) 98.4 F (36.9 C)   TempSrc: Oral Oral Oral   SpO2: 96% 96% 94%   Weight:    69.5 kg  Height: 5\' 6"  (1.676 m)       General: Pt is alert, awake, not in acute distress Cardiovascular: RRR, nl S1-S2, no murmurs appreciated.   No LE edema.   Respiratory: Normal respiratory rate and rhythm.  CTAB without rales or wheezes. Abdominal: Abdomen soft and non-tender.  No distension or HSM.   Neuro/Psych: Strength symmetric in upper and lower extremities.  Judgment and insight appear normal.   The results of significant diagnostics from this hospitalization (including imaging, microbiology, ancillary and laboratory) are listed below for reference.     Microbiology: Recent Results (from the past 240 hour(s))  Resp Panel by RT-PCR (Flu A&B, Covid) Nasopharyngeal Swab     Status: None   Collection Time: 04/02/21  2:31 PM   Specimen: Nasopharyngeal Swab; Nasopharyngeal(NP) swabs in vial transport medium  Result Value Ref Range Status   SARS Coronavirus 2 by RT PCR NEGATIVE NEGATIVE Final    Comment: (NOTE) SARS-CoV-2 target nucleic acids are NOT DETECTED.  The SARS-CoV-2 RNA is generally detectable in upper respiratory specimens during the acute phase of infection. The lowest concentration of SARS-CoV-2 viral copies this assay can detect is 138 copies/mL. A negative result does not preclude SARS-Cov-2 infection and should not be used as the sole basis for treatment or other patient management decisions. A negative result may occur with  improper specimen collection/handling, submission of specimen other than nasopharyngeal swab, presence of viral mutation(s) within the areas targeted by this assay, and inadequate  number of viral copies(<138 copies/mL). A negative result must be combined with clinical observations, patient history, and epidemiological information. The expected result is Negative.  Fact Sheet for Patients:  EntrepreneurPulse.com.au  Fact Sheet for Healthcare Providers:  IncredibleEmployment.be  This test is no t yet approved or cleared by the [place] FDA and  has been authorized for detection and/or diagnosis of SARS-CoV-2 by FDA under an Emergency Use Authorization (EUA). This EUA will remain  in effect (meaning this test can be used) for the duration of the COVID-19 declaration under Section 564(b)(1) of the Act, 21 U.S.C.section 360bbb-3(b)(1), unless the authorization is terminated  or revoked sooner.  Influenza A by PCR NEGATIVE NEGATIVE Final   Influenza B by PCR NEGATIVE NEGATIVE Final    Comment: (NOTE) The Xpert Xpress SARS-CoV-2/FLU/RSV plus assay is intended as an aid in the diagnosis of influenza from Nasopharyngeal swab specimens and should not be used as a sole basis for treatment. Nasal washings and aspirates are unacceptable for Xpert Xpress SARS-CoV-2/FLU/RSV testing.  Fact Sheet for Patients: EntrepreneurPulse.com.au  Fact Sheet for Healthcare Providers: IncredibleEmployment.be  This test is not yet approved or cleared by the [place] FDA and has been authorized for detection and/or diagnosis of SARS-CoV-2 by FDA under an Emergency Use Authorization (EUA). This EUA will remain in effect (meaning this test can be used) for the duration of the COVID-19 declaration under Section 564(b)(1) of the Act, 21 U.S.C. section 360bbb-3(b)(1), unless the authorization is terminated or revoked.  Performed at [facility], [place] [address] [place], [place] [address]      Labs: BNP (last 3 results) No results for input(s): BNP in the last 8760 hours. Basic Metabolic  Panel: Recent Labs  Lab 04/02/21 1359 04/02/21 1403 04/02/21 2024 04/02/21 2146 04/03/21 0405 04/03/21 0755  NA 127* 128* 129* 131* 135  --   K 2.8* 2.7* 2.7* 2.9* 3.2*  --   CL 95* 98 99 101 110  --   CO2 18*  --  21* 21* 20*  --   GLUCOSE 108* 106* 92 152* 101*  --   BUN 41* 38* 38* 35* 33*  --   CREATININE 4.73* 5.00* 3.09* 2.92* 2.20*  --   CALCIUM 9.1  --  8.7* 8.9 8.6*  --   MG 1.9  --   --   --   --  1.9  PHOS  --   --  4.4  --   --   --    Liver Function Tests: Recent Labs  Lab 04/02/21 1359 04/02/21 2024  AST 18  --   ALT 13  --   ALKPHOS 62  --   BILITOT 1.0  --   PROT 6.8  --   ALBUMIN 3.9 3.7   No results for input(s): LIPASE, AMYLASE in the last 168 hours. No results for input(s): AMMONIA in the last 168 hours. CBC: Recent Labs  Lab 04/02/21 1359 04/02/21 1403 04/03/21 0405  WBC 13.9*  --  8.8  NEUTROABS 8.9*  --  5.2  HGB 14.1 14.6 12.2*  HCT 41.4 43.0 34.7*  MCV 91.8  --  88.1  PLT 247  --  204   Cardiac Enzymes: No results for input(s): CKTOTAL, CKMB, CKMBINDEX, TROPONINI in the last 168 hours. BNP: Invalid input(s): POCBNP CBG: Recent Labs  Lab 04/02/21 1428  GLUCAP 92   D-Dimer No results for input(s): DDIMER in the last 72 hours. Hgb A1c No results for input(s): HGBA1C in the last 72 hours. Lipid Profile No results for input(s): CHOL, HDL, LDLCALC, TRIG, CHOLHDL, LDLDIRECT in the last 72 hours. Thyroid function studies Recent Labs    04/02/21 2024  TSH 4.033   Anemia work up No results for input(s): VITAMINB12, FOLATE, FERRITIN, TIBC, IRON, RETICCTPCT in the last 72 hours. Urinalysis    Component Value Date/Time   COLORURINE YELLOW 04/02/2021 1500   APPEARANCEUR HAZY (A) 04/02/2021 1500   APPEARANCEUR Clear 06/25/2018 1642   LABSPEC 1.008 04/02/2021 1500   PHURINE 5.0 04/02/2021 1500   GLUCOSEU NEGATIVE 04/02/2021 1500   HGBUR SMALL (A) 04/02/2021 1500   HGBUR negative 03/16/2008 0932  BILIRUBINUR NEGATIVE  04/02/2021 1500   BILIRUBINUR Negative 06/25/2018 1642   KETONESUR NEGATIVE 04/02/2021 1500   PROTEINUR NEGATIVE 04/02/2021 1500   UROBILINOGEN 0.2 03/19/2018 1557   UROBILINOGEN 1.0 12/08/2014 0912   NITRITE NEGATIVE 04/02/2021 1500   LEUKOCYTESUR NEGATIVE 04/02/2021 1500   Sepsis Labs Invalid input(s): PROCALCITONIN,  WBC,  LACTICIDVEN Microbiology Recent Results (from the past 240 hour(s))  Resp Panel by RT-PCR (Flu A&B, Covid) Nasopharyngeal Swab     Status: None   Collection Time: 04/02/21  2:31 PM   Specimen: Nasopharyngeal Swab; Nasopharyngeal(NP) swabs in vial transport medium  Result Value Ref Range Status   SARS Coronavirus 2 by RT PCR NEGATIVE NEGATIVE Final    Comment: (NOTE) SARS-CoV-2 target nucleic acids are NOT DETECTED.  The SARS-CoV-2 RNA is generally detectable in upper respiratory specimens during the acute phase of infection. The lowest concentration of SARS-CoV-2 viral copies this assay can detect is 138 copies/mL. A negative result does not preclude SARS-Cov-2 infection and should not be used as the sole basis for treatment or other patient management decisions. A negative result may occur with  improper specimen collection/handling, submission of specimen other than nasopharyngeal swab, presence of viral mutation(s) within the areas targeted by this assay, and inadequate number of viral copies(<138 copies/mL). A negative result must be combined with clinical observations, patient history, and epidemiological information. The expected result is Negative.  Fact Sheet for Patients:  EntrepreneurPulse.com.au  Fact Sheet for Healthcare Providers:  IncredibleEmployment.be  This test is no t yet approved or cleared by the [place] FDA and  has been authorized for detection and/or diagnosis of SARS-CoV-2 by FDA under an Emergency Use Authorization (EUA). This EUA will remain  in effect (meaning this test can be used)  for the duration of the COVID-19 declaration under Section 564(b)(1) of the Act, 21 U.S.C.section 360bbb-3(b)(1), unless the authorization is terminated  or revoked sooner.       Influenza A by PCR NEGATIVE NEGATIVE Final   Influenza B by PCR NEGATIVE NEGATIVE Final    Comment: (NOTE) The Xpert Xpress SARS-CoV-2/FLU/RSV plus assay is intended as an aid in the diagnosis of influenza from Nasopharyngeal swab specimens and should not be used as a sole basis for treatment. Nasal washings and aspirates are unacceptable for Xpert Xpress SARS-CoV-2/FLU/RSV testing.  Fact Sheet for Patients: EntrepreneurPulse.com.au  Fact Sheet for Healthcare Providers: IncredibleEmployment.be  This test is not yet approved or cleared by the [place] FDA and has been authorized for detection and/or diagnosis of SARS-CoV-2 by FDA under an Emergency Use Authorization (EUA). This EUA will remain in effect (meaning this test can be used) for the duration of the COVID-19 declaration under Section 564(b)(1) of the Act, 21 U.S.C. section 360bbb-3(b)(1), unless the authorization is terminated or revoked.  Performed at [facility], [place] [address], [place], [place] [address]      Time coordinating discharge: 25 minutes The [place] controlled substances registry was reviewed for this patient     30 Day Unplanned Readmission Risk Score    Flowsheet Row ED to Hosp-Admission (Current) from 04/02/2021 in [facility]  30 Day Unplanned Readmission Risk Score (%) 24 Filed at 04/03/2021 0801       This score is the patient's risk of an unplanned readmission within 30 days of being discharged (0 -100%). The score is based on dignosis, age, lab data, medications, orders, and past utilization.   Low:  0-14.9   Medium: 15-21.9  High: 22-29.9   Extreme: 30 and above             SIGNED:   Edwin Dada, MD  [facility] 04/03/2021, 3:43 PM

## 2021-04-03 NOTE — Care Management Obs Status (Signed)
[facility] NOTIFICATION   Patient Details  Name: Jaime Barnes MRN: [id number] Date of Birth: 11-17-1950   Medicare Observation Status Notification Given:  Yes    Marilu Favre, RN 04/03/2021, 12:14 PM

## 2021-04-03 NOTE — Progress Notes (Signed)
[facility] Progress Note    Assessment/ Plan:   AKI on CKD3, improving : Likely secondary to hemodynamic insult/relative hypotension/pre-renal injury in the context of ACEI and thiazide. BL Cr around 1.3-1.6 (underlying CKD likely related to HTN). Peak Cr 5 on presentation yesterday, now down to 2.2 -UA sig for hyaline casts -renal u/s pending, can hold off on this -hold ACEI and HCTZ -agree with mIVF, can d/c if eating and drinking more -Continue to monitor daily Cr, Dose meds for GFR<15 -Monitor Daily I/Os, Daily weight -Maintain MAP>65 for optimal renal perfusion. -Agree with holding ACE-I, avoid further nephrotoxins including NSAIDS, Morphine.  Unless absolutely necessary, avoid CT with contrast and/or MRI with gadolinium.      Hyponatremia-resolved -likely related to HCTZ, low solute intake, AKI -improved w/ isotonic fluid   Hypokalemia, imrpvoing -replete prn   Metabolic acidosis 2/2 AKI -nahco3 650mg  bid   H/O Hypertension: -cont to hold anti-HTN's for now, will need to resume slowly when indicated. BP still borderline now despite not being on anti-hypertensives  Will sign off from a nephrology perspective. Will need to have labs checked as an outpatient w/ PCP. If any concerns at that time, can be referred to our office for further evaluation. Please call with any questions/concerns.  Gean Quint, MD [facility]  Subjective:   No acute events. Eager to go home. Wanted to leave [facility] yesterday but ended up staying. Cr down to 2.2. he reports that his appetite is back and has been eating and keeping up with water intake   Objective:   BP 107/68 (BP Location: Right Arm)   Pulse 72   Temp 98.4 F (36.9 C) (Oral)   Resp 17   Ht 5\' 6"  (1.676 m)   Wt 69.5 kg   SpO2 94%   BMI 24.74 kg/m   Intake/Output Summary (Last 24 hours) at 04/03/2021 1016 Last data filed at 04/03/2021 0411 Gross per 24 hour  Intake 4281.17 ml  Output --  Net  4281.17 ml   Weight change:   Physical Exam: Gen:nad, nontoxic CVS:s1s2, rrr Resp: no iwob PPI:RJJO, nd Ext:no edema Neuro: awake, alert, speech clear and coherent, moves all ext spontaneously  Imaging: DG Chest 1 View  Result Date: 04/02/2021 CLINICAL DATA:  Per roommate pt woke up and found himself on floor today in bathroom by toilet. Roommate reports pt then fell again in hall, no complaints at this time EXAM: CHEST  1 VIEW COMPARISON:  Chest x-ray 02/27/2021, CT chest 09/27/2020. FINDINGS: The heart size and mediastinal contours are unchanged. Aortic calcification Nodular density overlying the peripheral right mid lung zone. Query peripheral hazy airspace opacity within the left mid lung zone versus overlying soft tissues. Slightly coarsened interstitial markings with no overt pulmonary edema. No pleural effusion. No pneumothorax. No acute osseous abnormality. IMPRESSION: 1. Nodular density overlying the peripheral right mid lung zone. Recommend CT chest for further evaluation. 2. Query peripheral hazy airspace opacity within the left mid lung zone versus overlying soft tissues. Electronically Signed   By: Iven Finn M.D.   On: 04/02/2021 16:18   CT HEAD WO CONTRAST  Result Date: 04/02/2021 CLINICAL DATA:  Recurrent syncope. EXAM: CT HEAD WITHOUT CONTRAST TECHNIQUE: Contiguous axial images were obtained from the base of the skull through the vertex without intravenous contrast. COMPARISON:  Head CT 07/19/2015 FINDINGS: Brain: No intracranial hemorrhage, mass effect, or midline shift. Normal for age atrophy. No hydrocephalus. Incidental cavum septum pellucidum. The basilar cisterns are patent. Minimal periventricular  chronic small vessel ischemia. No evidence of territorial infarct or acute ischemia. No extra-axial or intracranial fluid collection. Vascular: Atherosclerosis of skullbase vasculature without hyperdense vessel or abnormal calcification. Skull: No fracture or focal lesion.  Sinuses/Orbits: Paranasal sinuses and mastoid air cells are clear. The visualized orbits are unremarkable. Other: None. IMPRESSION: 1. No acute intracranial abnormality. 2. Normal for age atrophy and minimal chronic small vessel ischemia. Electronically Signed   By: Keith Rake M.D.   On: 04/02/2021 16:45    Labs: BMET Recent Labs  Lab 04/02/21 1359 04/02/21 1403 04/02/21 2024 04/02/21 2146 04/03/21 0405  NA 127* 128* 129* 131* 135  K 2.8* 2.7* 2.7* 2.9* 3.2*  CL 95* 98 99 101 110  CO2 18*  --  21* 21* 20*  GLUCOSE 108* 106* 92 152* 101*  BUN 41* 38* 38* 35* 33*  CREATININE 4.73* 5.00* 3.09* 2.92* 2.20*  CALCIUM 9.1  --  8.7* 8.9 8.6*  PHOS  --   --  4.4  --   --    CBC Recent Labs  Lab 04/02/21 1359 04/02/21 1403 04/03/21 0405  WBC 13.9*  --  8.8  NEUTROABS 8.9*  --  5.2  HGB 14.1 14.6 12.2*  HCT 41.4 43.0 34.7*  MCV 91.8  --  88.1  PLT 247  --  204    Medications:     aspirin  81 mg Oral Daily   atorvastatin  40 mg Oral Daily   gabapentin  300 mg Oral Daily   heparin  5,000 Units Subcutaneous Q12H   pantoprazole  40 mg Oral Daily   risperiDONE  1 mg Oral QHS   sodium bicarbonate  650 mg Oral BID   traZODone  50-100 mg Oral QHS      Gean Quint, MD [facility] 04/03/2021, 10:16 AM

## 2021-04-03 NOTE — Progress Notes (Signed)
Pt wanting to leave floor and go outside hospital, pt has peripheral iv in place. This rn will page md of pt wanting to go outside

## 2021-04-03 NOTE — Progress Notes (Signed)
Pt wanting to eat, voicing agitation and frustration with no diet order. This rn will page md for diet status

## 2021-04-05 ENCOUNTER — Other Ambulatory Visit: Payer: Self-pay

## 2021-04-05 ENCOUNTER — Ambulatory Visit (INDEPENDENT_AMBULATORY_CARE_PROVIDER_SITE_OTHER): Payer: Medicare Other | Admitting: Internal Medicine

## 2021-04-05 ENCOUNTER — Encounter: Payer: Self-pay | Admitting: Internal Medicine

## 2021-04-05 VITALS — BP 142/74 | HR 80 | Temp 98.4°F | Ht 66.0 in | Wt 159.0 lb

## 2021-04-05 DIAGNOSIS — I10 Essential (primary) hypertension: Secondary | ICD-10-CM

## 2021-04-05 DIAGNOSIS — E785 Hyperlipidemia, unspecified: Secondary | ICD-10-CM | POA: Diagnosis not present

## 2021-04-05 DIAGNOSIS — N1831 Chronic kidney disease, stage 3a: Secondary | ICD-10-CM | POA: Diagnosis not present

## 2021-04-05 DIAGNOSIS — N179 Acute kidney failure, unspecified: Secondary | ICD-10-CM

## 2021-04-05 LAB — BASIC METABOLIC PANEL
BUN: 16 mg/dL (ref 6–23)
CO2: 23 mEq/L (ref 19–32)
Calcium: 8.9 mg/dL (ref 8.4–10.5)
Chloride: 110 mEq/L (ref 96–112)
Creatinine, Ser: 1.24 mg/dL (ref 0.40–1.50)
GFR: 58.83 mL/min — ABNORMAL LOW (ref >60.00)
Glucose, Bld: 79 mg/dL (ref 70–99)
Potassium: 3.7 mEq/L (ref 3.5–5.1)
Sodium: 140 mEq/L (ref 135–145)

## 2021-04-05 NOTE — Progress Notes (Signed)
Patient ID: Jaime Barnes, male   DOB: 1951-02-01, 70 y.o.   MRN: [id number]        Chief Complaint: follow up hospn as PCP out of office       HPI:  Jaime Barnes is a 70 y.o. male here post hospn july 5 - 6 with AKI on CKD, low volume with syncope also on ACE and HCT, low K and pt also need antilipid med refill.  Pt follows with nephrology, and cirrhosis, who presented with vomiting and passing out found to have AKI.[name]  Patient was in his usual state of health until before admission when he had a hot dog, immediately tasted foul, and within a short time he started vomiting.  He had abdominal cramps and frequent diarrhea ovenright, passed out twice, and kept vomiting, until he syncopized twice and was dizzy with standing. Also in ED BP was hypotensive.  Tx with IVF, IV K and monitored overnight.  ACE and HCT continue to be held post d/c,  Start Cr > 5, and improved to 2.2 quickly.  Pt denies chest pain, increased sob or doe, wheezing, orthopnea, PND, increased LE swelling, palpitations, dizziness or syncope.   Pt denies polydipsia, polyuria, or new focal neuro s/s.   Pt denies fever, wt loss, night sweats, loss of appetite, or other constitutional symptoms  No new complaints.  Will need f/u lab today.  Denies worsening reflux, abd pain, dysphagia, n/v, bowel change or blood.  Wt Readings from Last 3 Encounters:  04/05/21 159 lb (72.1 kg)  04/03/21 153 lb 4.8 oz (69.5 kg)  03/06/21 164 lb 9.6 oz (74.7 kg)   BP Readings from Last 3 Encounters:  04/05/21 (!) 142/74  04/03/21 107/68  04/02/21 (!) 77/53         Past Medical History:  Diagnosis Date   Barrett's esophagus 2011   Recommend repeat EGD for surveillance in 2 yrs-[facility]   Bipolar disorder ([place])    Chronic kidney disease    Cirrhosis ([place])    Diverticulosis of colon    GANGLION CYST, TENDON SHEATH 06/26/2010   Hyperlipidemia    Hypertension    Hypertriglyceridemia 01/21/2019   Impotence    Migraine    Pancreatitis    Pulmonary nodule     Pulmonary nodule seen on imaging study 02/06/2011   Followup CT in December 2014 with 20 months of stability at 4 mm.  No further imaging recommended    Past Surgical History:  Procedure Laterality Date   CARDIAC CATHETERIZATION  2005   Nonobstructive, <40%   TONSILLECTOMY Bilateral 1961    reports that he has been smoking cigars and cigarettes. He started smoking about 58 years ago. He has a 13.25 pack-year smoking history. He quit smokeless tobacco use about 4 years ago. He reports current drug use. Drug: Marijuana. He reports that he does not drink alcohol. family history includes Anemia in his mother; Diabetes in his daughter; Hypertension in his daughter; Migraines in his daughter. Allergies  Allergen Reactions   Other     Other reaction(s): rash, sweat, nausea Other reaction(s): rash, sweat, nausea   Codeine Phosphate Other (See Comments)    REACTION: breathing difficulty   Penicillins Rash and Other (See Comments)    Break out in sweats/ Rash amoxicillin. Denies airway involvement Has patient had a PCN reaction causing immediate rash, facial/tongue/throat swelling, SOB or lightheadedness with hypotension: Yes Has patient had a PCN reaction causing severe rash involving mucus membranes or skin necrosis: Yes Has patient  had a PCN reaction that required hospitalization No Has patient had a PCN reaction occurring within the last 10 years: No If all of the above answers are "NO", then may proceed with Cephalosporin u   Current Outpatient Medications on File Prior to Visit  Medication Sig Dispense Refill   albuterol (VENTOLIN HFA) 108 (90 Base) MCG/ACT inhaler Inhale 1-2 puffs into the lungs every 6 (six) hours as needed for wheezing or shortness of breath. 18 g 3   aspirin 81 MG chewable tablet Chew 81 mg by mouth daily.     atorvastatin (LIPITOR) 80 MG tablet Take 80 mg by mouth daily.     gabapentin (NEURONTIN) 300 MG capsule Take 1 capsule (300 mg total) by mouth 2 (two) times  daily. 180 capsule 1   MAGNESIUM-OXIDE 400 (241.3 Mg) MG tablet TAKE 1 TABLET BY MOUTH EVERY DAY (Patient taking differently: Take 400 mg by mouth daily.) 30 tablet 0   omeprazole (PRILOSEC) 40 MG capsule Take 1 capsule (40 mg total) by mouth daily. 90 capsule 1   risperiDONE (RISPERDAL) 1 MG tablet Take 1 tablet (1 mg total) by mouth at bedtime. 90 tablet 1   sildenafil (VIAGRA) 100 MG tablet TAKE 1/2 (ONE-HALF) TABLET BY MOUTH ONCE DAILY AS NEEDED FOR  ERECTILE  DYSFUNCTION (Patient taking differently: Take 50 mg by mouth as needed for erectile dysfunction.) 30 tablet 2   topiramate (TOPAMAX) 50 MG tablet 1 tablet     traMADol (ULTRAM) 50 MG tablet Take 1 tablet (50 mg total) by mouth every 12 (twelve) hours as needed. 60 tablet 2   traZODone (DESYREL) 100 MG tablet Take 0.5-1 tablets (50-100 mg total) by mouth at bedtime. 60 tablet 1   Ubrogepant (UBRELVY) 100 MG TABS TAKE 1 TABLET(100 MG) BY MOUTH 1 TIME EVERY 2 HOURS AS DIRECTED AS NEEDED. MAXIMUM 2 TABLETS IN 24 HOURS (Patient taking differently: Take 2 tablets by mouth daily as needed (Migraine).) 16 tablet 5   valACYclovir (VALTREX) 500 MG tablet Take 1 tablet (500 mg total) by mouth 2 (two) times daily. 6 tablet 3   No current facility-administered medications on file prior to visit.        ROS:  All others reviewed and negative.  Objective        PE:  BP (!) 142/74 (BP Location: Left Arm, Patient Position: Sitting, Cuff Size: Normal)   Pulse 80   Temp 98.4 F (36.9 C) (Oral)   Ht 5\' 6"  (1.676 m)   Wt 159 lb (72.1 kg)   SpO2 98%   BMI 25.66 kg/m                 Constitutional: Pt appears in NAD               HENT: Head: NCAT.                Right Ear: External ear normal.                 Left Ear: External ear normal.                Eyes: . Pupils are equal, round, and reactive to light. Conjunctivae and EOM are normal               Nose: without d/c or deformity               Neck: Neck supple. Gross normal ROM  Cardiovascular: Normal rate and regular rhythm.                 Pulmonary/Chest: Effort normal and breath sounds without rales or wheezing.                Abd:  Soft, NT, ND, + BS, no organomegaly               Neurological: Pt is alert. At baseline orientation, motor grossly intact               Skin: Skin is warm. No rashes, no other new lesions, LE edema - none               Psychiatric: Pt behavior is normal without agitation   Micro: none  Cardiac tracings I have personally interpreted today:  none  Pertinent Radiological findings (summarize): none   Lab Results  Component Value Date   WBC 8.8 04/03/2021   HGB 12.2 (L) 04/03/2021   HCT 34.7 (L) 04/03/2021   PLT 204 04/03/2021   GLUCOSE 79 04/05/2021   CHOL 100 12/03/2020   TRIG 144 12/03/2020   HDL 27 (L) 12/03/2020   LDLDIRECT 156 (H) 06/09/2011   LDLCALC 48 12/03/2020   ALT 13 04/02/2021   AST 18 04/02/2021   NA 140 04/05/2021   K 3.7 04/05/2021   CL 110 04/05/2021   CREATININE 1.24 04/05/2021   BUN 16 04/05/2021   CO2 23 04/05/2021   TSH 4.033 04/02/2021   PSA 0.33 08/21/2009   INR 1.17 04/19/2015   HGBA1C 5.8 (H) 12/03/2020   Assessment/Plan:  Jaime Barnes is a 70 y.o. Black or African American [2] male with  has a past medical history of Barrett's esophagus (2011), Bipolar disorder ([place]), Chronic kidney disease, Cirrhosis ([place]), Diverticulosis of colon, GANGLION CYST, TENDON SHEATH (06/26/2010), Hyperlipidemia, Hypertension, Hypertriglyceridemia (01/21/2019), Impotence, Migraine, Pancreatitis, Pulmonary nodule, and Pulmonary nodule seen on imaging study (02/06/2011).  AKI (acute kidney injury) ([place]) sypmtomatically improved, for f/u BMP,  to f/u any worsening symptoms or concerns  Hyperlipidemia LDL goal <70 Lab Results  Component Value Date   LDLCALC 48 12/03/2020   Stable, pt to continue current statin  - for lipitor refill   HYPERTENSION, BENIGN SYSTEMIC Ok to continue to hold ACE and HCT for now, f/u  PCP in 1 wk,  to f/u any worsening symptoms or concerns  Acute renal failure superimposed on stage 3a chronic kidney disease ([place]) sypmtomatically improved, for f/u BMP,  to f/u any worsening symptoms or concerns  Followup: No follow-ups on file.  Cathlean Cower, MD 04/09/2021 8:41 PM [facility]'

## 2021-04-05 NOTE — Patient Instructions (Addendum)
Ok to continue to HOLD the lisinopril and HCT fluid pill as you are doing  Please continue all other medications as before, and refills have been done if requested - the atorvastatin for cholesterol  Please have the pharmacy call with any other refills you may need.  Please keep your appointments with your specialists as you may have planned  Please go to the LAB at the blood drawing area for the tests to be done  You will be contacted by phone if any changes need to be made immediately.  Otherwise, you will receive a letter about your results with an explanation, but please check with MyChart first.  Please remember to sign up for MyChart if you have not done so, as this will be important to you in the future with finding out test results, communicating by private email, and scheduling acute appointments online when needed.  Please make an Appointment to return in 6 days (next Thursday) to see Dr Ronnald Ramp or as soon after as possible

## 2021-04-08 ENCOUNTER — Telehealth: Payer: Self-pay | Admitting: *Deleted

## 2021-04-08 NOTE — Telephone Encounter (Signed)
Patient is calling to schedule an upcoming appointment for RFC. Please call.

## 2021-04-09 DIAGNOSIS — N1831 Chronic kidney disease, stage 3a: Secondary | ICD-10-CM | POA: Insufficient documentation

## 2021-04-09 DIAGNOSIS — N179 Acute kidney failure, unspecified: Secondary | ICD-10-CM | POA: Insufficient documentation

## 2021-04-09 NOTE — Assessment & Plan Note (Signed)
sypmtomatically improved, for f/u BMP,  to f/u any worsening symptoms or concerns

## 2021-04-09 NOTE — Assessment & Plan Note (Signed)
Lab Results  Component Value Date   LDLCALC 48 12/03/2020   Stable, pt to continue current statin  - for lipitor refill

## 2021-04-09 NOTE — Assessment & Plan Note (Addendum)
Ok to continue to hold ACE and HCT for now, f/u PCP in 1 wk,  to f/u any worsening symptoms or concerns

## 2021-04-10 ENCOUNTER — Other Ambulatory Visit: Payer: Self-pay

## 2021-04-10 ENCOUNTER — Ambulatory Visit (INDEPENDENT_AMBULATORY_CARE_PROVIDER_SITE_OTHER): Payer: Medicare Other | Admitting: Pharmacist Clinician (PhC)/ Clinical Pharmacy Specialist

## 2021-04-10 DIAGNOSIS — I10 Essential (primary) hypertension: Secondary | ICD-10-CM | POA: Diagnosis not present

## 2021-04-10 NOTE — Assessment & Plan Note (Signed)
Patient with essential hypertension, currently with all medications on hold due to recent AKI.  He is feeling much better and in the past week notes increase in home BP readings.  Will have him take his amlodipine 10 mg dose tonight, then keep follow up appointment with Dr. Ronnald Ramp tomorrow.  We can follow him further for BP management, but will defer to PCP for now.

## 2021-04-10 NOTE — Patient Instructions (Addendum)
  Check your blood pressure at home daily and keep record of the readings.  Take your BP meds as follows:  Take amlodipine 10 mg tonight  Follow up with Dr. Jenny Reichmann tomorrow.   Bring all of your meds, your BP cuff and your record of home blood pressures to your next appointment.  Exercise as you're able, try to walk approximately 30 minutes per day.  Keep salt intake to a minimum, especially watch canned and prepared boxed foods.  Eat more fresh fruits and vegetables and fewer canned items.  Avoid eating in fast food restaurants.    HOW TO TAKE YOUR BLOOD PRESSURE: Rest 5 minutes before taking your blood pressure.  Don't smoke or drink caffeinated beverages for at least 30 minutes before. Take your blood pressure before (not after) you eat. Sit comfortably with your back supported and both feet on the floor (don't cross your legs). Elevate your arm to heart level on a table or a desk. Use the proper sized cuff. It should fit smoothly and snugly around your bare upper arm. There should be enough room to slip a fingertip under the cuff. The bottom edge of the cuff should be 1 inch above the crease of the elbow. Ideally, take 3 measurements at one sitting and record the average.

## 2021-04-10 NOTE — Progress Notes (Signed)
04/10/2021 Jaime Barnes 1951/03/19 [id number]   HPI:  Jaime Barnes is a 70 y.o. male patient of Dr Oval Linsey, with a [place] below who presents today for hypertension clinic evaluation.  He was seen by Dr. Oval Linsey on May 26 at which time she stopped verapamil, increased lisinopril to 20 mg and continued hctz 12.5 mg and amlodipine 10 mg.   He was then admitted to [facility] with AKI on July 5  On discharge all blood pressure medications were held until he could be seen in follow up.  His PCP saw him a few days later and advised holding meds.    We are seeing him today for continued hypertension management.  He also has an appointment tomorrow with primary care for the same thing.  Today he states feeling well, but concerned because home blood pressure readings are starting to go up.   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  CKD Recent AKD with SCr up to 5.0 (04/02/21), back down to 1.24 on 04/05/21        Blood Pressure Goal:  130/80  Current Medications: none  (previously lisinopril 20 mg, hctz 12.5 mg, amlodipine 10 mg and metoprolol succ 25 mg  Family Hx: unable to provide family history  Social Hx: occasional cigar; no alcohol, coffee 2 cups per day, no soda  Diet: home cooked, no added salt; has recently started eating fruits and vegetables; mix of meats  Exercise: push ups each morning, walk around apartment complex x 1, building up to hopefully two laps soon  Home BP readings:  has 4 readings from past few days, 165/89, 143/91, 147/85, 166/90  Intolerances: no cardiac medication intolerances  Labs: 04/05/21: Na 140, K 3.7, Glu 79, BUN 16, SCr 1.24  04/02/21: Na 128, K 2.7, Glu 106, BUN 38, SCr 5.00   Wt Readings from Last 3 Encounters:  04/10/21 162 lb 6.4 oz (73.7 kg)  04/05/21 159 lb (72.1 kg)  04/03/21 153 lb 4.8 oz (69.5 kg)   BP  Readings from Last 3 Encounters:  04/10/21 140/82  04/05/21 (!) 142/74  04/03/21 107/68   Pulse Readings from Last 3 Encounters:  04/10/21 66  04/05/21 80  04/03/21 72    Current Outpatient Medications  Medication Sig Dispense Refill   albuterol (VENTOLIN HFA) 108 (90 Base) MCG/ACT inhaler Inhale 1-2 puffs into the lungs every 6 (six) hours as needed for wheezing or shortness of breath. 18 g 3   aspirin 81 MG chewable tablet Chew 81 mg by mouth daily.     atorvastatin (LIPITOR) 80 MG tablet Take 80 mg by mouth daily.     gabapentin (NEURONTIN) 300 MG capsule Take 1 capsule (300 mg total) by mouth 2 (two) times daily. 180 capsule 1   MAGNESIUM-OXIDE 400 (241.3 Mg) MG tablet TAKE 1 TABLET BY MOUTH EVERY DAY (Patient taking differently: Take 400 mg by mouth daily.) 30 tablet 0   omeprazole (PRILOSEC) 40 MG capsule Take 1 capsule (40 mg total) by mouth daily. 90 capsule 1   risperiDONE (RISPERDAL) 1 MG tablet Take 1 tablet (1 mg total) by mouth at bedtime. 90 tablet 1   sildenafil (VIAGRA) 100 MG tablet TAKE 1/2 (ONE-HALF) TABLET BY MOUTH ONCE DAILY AS NEEDED FOR  ERECTILE  DYSFUNCTION (Patient taking differently: Take 50 mg by mouth as needed for erectile  dysfunction.) 30 tablet 2   topiramate (TOPAMAX) 50 MG tablet 1 tablet     traMADol (ULTRAM) 50 MG tablet Take 1 tablet (50 mg total) by mouth every 12 (twelve) hours as needed. 60 tablet 2   traZODone (DESYREL) 100 MG tablet Take 0.5-1 tablets (50-100 mg total) by mouth at bedtime. 60 tablet 1   Ubrogepant (UBRELVY) 100 MG TABS TAKE 1 TABLET(100 MG) BY MOUTH 1 TIME EVERY 2 HOURS AS DIRECTED AS NEEDED. MAXIMUM 2 TABLETS IN 24 HOURS (Patient taking differently: Take 2 tablets by mouth daily as needed (Migraine).) 16 tablet 5   valACYclovir (VALTREX) 500 MG tablet Take 1 tablet (500 mg total) by mouth 2 (two) times daily. 6 tablet 3   No current facility-administered medications for this visit.    Allergies  Allergen Reactions   Other      Other reaction(s): rash, sweat, nausea Other reaction(s): rash, sweat, nausea   Codeine Phosphate Other (See Comments)    REACTION: breathing difficulty   Penicillins Rash and Other (See Comments)    Break out in sweats/ Rash amoxicillin. Denies airway involvement Has patient had a PCN reaction causing immediate rash, facial/tongue/throat swelling, SOB or lightheadedness with hypotension: Yes Has patient had a PCN reaction causing severe rash involving mucus membranes or skin necrosis: Yes Has patient had a PCN reaction that required hospitalization No Has patient had a PCN reaction occurring within the last 10 years: No If all of the above answers are "NO", then may proceed with Cephalosporin u    Past Medical History:  Diagnosis Date   Barrett's esophagus 2011   Recommend repeat EGD for surveillance in 2 yrs-[facility]   Bipolar disorder ([place])    Chronic kidney disease    Cirrhosis ([place])    Diverticulosis of colon    GANGLION CYST, TENDON SHEATH 06/26/2010   Hyperlipidemia    Hypertension    Hypertriglyceridemia 01/21/2019   Impotence    Migraine    Pancreatitis    Pulmonary nodule    Pulmonary nodule seen on imaging study 02/06/2011   Followup CT in December 2014 with 20 months of stability at 4 mm.  No further imaging recommended     Blood pressure 140/82, pulse 66, height 5\' 6"  (1.676 m), weight 162 lb 6.4 oz (73.7 kg).  HYPERTENSION, BENIGN SYSTEMIC Patient with essential hypertension, currently with all medications on hold due to recent AKI.  He is feeling much better and in the past week notes increase in home BP readings.  Will have him take his amlodipine 10 mg dose tonight, then keep follow up appointment with Dr. Ronnald Ramp tomorrow.  We can follow him further for BP management, but will defer to PCP for now.     Tommy Medal PharmD CPP [facility] [address] [place], [place] [address] [phone]

## 2021-04-11 ENCOUNTER — Encounter: Payer: Self-pay | Admitting: Internal Medicine

## 2021-04-11 ENCOUNTER — Ambulatory Visit (INDEPENDENT_AMBULATORY_CARE_PROVIDER_SITE_OTHER): Payer: Medicare Other | Admitting: Internal Medicine

## 2021-04-11 VITALS — BP 124/74 | HR 76 | Temp 98.0°F | Resp 16 | Ht 66.0 in | Wt 163.0 lb

## 2021-04-11 DIAGNOSIS — D539 Nutritional anemia, unspecified: Secondary | ICD-10-CM | POA: Insufficient documentation

## 2021-04-11 DIAGNOSIS — D510 Vitamin B12 deficiency anemia due to intrinsic factor deficiency: Secondary | ICD-10-CM

## 2021-04-11 DIAGNOSIS — E876 Hypokalemia: Secondary | ICD-10-CM | POA: Diagnosis not present

## 2021-04-11 DIAGNOSIS — I10 Essential (primary) hypertension: Secondary | ICD-10-CM

## 2021-04-11 DIAGNOSIS — N1831 Chronic kidney disease, stage 3a: Secondary | ICD-10-CM | POA: Diagnosis not present

## 2021-04-11 LAB — CBC WITH DIFFERENTIAL/PLATELET
Basophils Absolute: 0.1 10*3/uL (ref 0.0–0.1)
Basophils Relative: 0.6 % (ref 0.0–3.0)
Eosinophils Absolute: 0.1 10*3/uL (ref 0.0–0.7)
Eosinophils Relative: 1.2 % (ref 0.0–5.0)
HCT: 36 % — ABNORMAL LOW (ref 39.0–52.0)
Hemoglobin: 12.4 g/dL — ABNORMAL LOW (ref 13.0–17.0)
Lymphocytes Relative: 24.9 % (ref 12.0–46.0)
Lymphs Abs: 2.3 10*3/uL (ref 0.7–4.0)
MCHC: 34.5 g/dL (ref 30.0–36.0)
MCV: 91.2 fl (ref 78.0–100.0)
Monocytes Absolute: 0.8 10*3/uL (ref 0.1–1.0)
Monocytes Relative: 8.8 % (ref 3.0–12.0)
Neutro Abs: 6 10*3/uL (ref 1.4–7.7)
Neutrophils Relative %: 64.5 % (ref 43.0–77.0)
Platelets: 231 10*3/uL (ref 150.0–400.0)
RBC: 3.95 Mil/uL — ABNORMAL LOW (ref 4.22–5.81)
RDW: 13.8 % (ref 11.5–15.5)
WBC: 9.3 10*3/uL (ref 4.0–10.5)

## 2021-04-11 LAB — IRON: Iron: 49 ug/dL (ref 42–165)

## 2021-04-11 LAB — VITAMIN B12: Vitamin B-12: 179 pg/mL — ABNORMAL LOW (ref 211–911)

## 2021-04-11 LAB — BASIC METABOLIC PANEL
BUN: 17 mg/dL (ref 6–23)
CO2: 24 mEq/L (ref 19–32)
Calcium: 9.3 mg/dL (ref 8.4–10.5)
Chloride: 108 mEq/L (ref 96–112)
Creatinine, Ser: 1.24 mg/dL (ref 0.40–1.50)
GFR: 58.82 mL/min — ABNORMAL LOW (ref >60.00)
Glucose, Bld: 90 mg/dL (ref 70–99)
Potassium: 3.7 mEq/L (ref 3.5–5.1)
Sodium: 139 mEq/L (ref 135–145)

## 2021-04-11 LAB — MAGNESIUM: Magnesium: 1.7 mg/dL (ref 1.5–2.5)

## 2021-04-11 LAB — FERRITIN: Ferritin: 67.8 ng/mL (ref 22.0–322.0)

## 2021-04-11 LAB — FOLATE: Folate: 10 ng/mL (ref >5.9)

## 2021-04-11 NOTE — Patient Instructions (Signed)
[employer] medicine (25th ed., pp. [phone]). [place], [place]: Elsevier.">  Anemia  Anemia is a condition in which there is not enough red blood cells or hemoglobin in the blood. Hemoglobin is a substance in red blood cells thatcarries oxygen. When you do not have enough red blood cells or hemoglobin (are anemic), your body cannot get enough oxygen and your organs may not work properly. Asa result, you may feel very tired or have other problems. What are the causes? Common causes of anemia include: Excessive bleeding. Anemia can be caused by excessive bleeding inside or outside the body, including bleeding from the intestines or from heavy menstrual periods in females. Poor nutrition. Long-lasting (chronic) kidney, thyroid, and liver disease. Bone marrow disorders, spleen problems, and blood disorders. Cancer and treatments for cancer. HIV (human immunodeficiency virus) and AIDS (acquired immunodeficiency syndrome). Infections, medicines, and autoimmune disorders that destroy red blood cells. What are the signs or symptoms? Symptoms of this condition include: Minor weakness. Dizziness. Headache, or difficulties concentrating and sleeping. Heartbeats that feel irregular or faster than normal (palpitations). Shortness of breath, especially with exercise. Pale skin, lips, and nails, or cold hands and feet. Indigestion and nausea. Symptoms may occur suddenly or develop slowly. If your anemia is mild, you maynot have symptoms. How is this diagnosed? This condition is diagnosed based on blood tests, your medical history, and a physical exam. In some cases, a test may be needed in which cells are removed from the soft tissue inside of a bone and looked at under a microscope (bone marrow biopsy). Your health care provider may also check your stool (feces) for blood and may do additional testing to look for the cause of yourbleeding. Other tests may include: Imaging tests, such as a CT scan or  MRI. A procedure to see inside your esophagus and stomach (endoscopy). A procedure to see inside your colon and rectum (colonoscopy). How is this treated? Treatment for this condition depends on the cause. If you continue to lose a lot of blood, you may need to be treated at a hospital. Treatment may include: Taking supplements of iron, vitamin D62, or folic acid. Taking a hormone medicine (erythropoietin) that can help to stimulate red blood cell growth. Having a blood transfusion. This may be needed if you lose a lot of blood. Making changes to your diet. Having surgery to remove your spleen. Follow these instructions at home: Take over-the-counter and prescription medicines only as told by your health care provider. Take supplements only as told by your health care provider. Follow any diet instructions that you were given by your health care provider. Keep all follow-up visits as told by your health care provider. This is important. Contact a health care provider if: You develop new bleeding anywhere in the body. Get help right away if: You are very weak. You are short of breath. You have pain in your abdomen or chest. You are dizzy or feel faint. You have trouble concentrating. You have bloody stools, black stools, or tarry stools. You vomit repeatedly or you vomit up blood. These symptoms may represent a serious problem that is an emergency. Do not wait to see if the symptoms will go away. Get medical help right away. Call your local emergency services (911 in the [place] Do not drive yourself to the hospital. Summary Anemia is a condition in which you do not have enough red blood cells or enough of a substance in your red blood cells that carries oxygen (hemoglobin). Symptoms may occur suddenly  or develop slowly. If your anemia is mild, you may not have symptoms. This condition is diagnosed with blood tests, a medical history, and a physical exam. Other tests may be  needed. Treatment for this condition depends on the cause of the anemia. This information is not intended to replace advice given to you by your health care provider. Make sure you discuss any questions you have with your healthcare provider. Document Revised: 08/23/2019 Document Reviewed: 08/23/2019 Elsevier Patient Education  2022 [employer].

## 2021-04-11 NOTE — Progress Notes (Signed)
Subjective:  Patient ID: Jaime Barnes, male    DOB: [birth year]  Age: 70 y.o. MRN: [id number]  CC: Anemia and Hypertension  This visit occurred during the SARS-CoV-2 public health emergency.  Safety protocols were in place, including screening questions prior to the visit, additional usage of staff PPE, and extensive cleaning of exam room while observing appropriate contact time as indicated for disinfecting solutions.    HPI Jaime Barnes presents for f/up -   He recently ate a hot dog and afterwards had GI distress (V,N,D).  He was admitted for dehydration and acute kidney injury.  He tells me the GI symptoms have resolved.  He saw cardiology 1 day prior to this visit and is taking amlodipine for high blood pressure.  He was also found to be anemic.    PCP: Janith Lima, MD   Admit date: 04/02/2021 Discharge date: 04/03/2021   Admitted From: Home  Disposition:  Home    Recommendations for Outpatient Follow-up:  Follow up with PCP Dr. Ronnald Ramp in 1 week Dr. Ronnald Ramp: Please obtain BMP next Monday Dr. Ronnald Ramp: Please resume antihypertensives as needed Dr. Ronnald Ramp: Please re-adjust topiramate and gabapentin dosing pending renal function          Home Health: None  Equipment/Devices: None new   Discharge Condition: Good  CODE STATUS: FULL Diet recommendation: Regular   Brief/Interim Summary: Jaime Barnes is a 70 y.o. M with HTN, CKD IIIb baseline Cr 1.5/1.7 follows with nephrology, and cirrhosis, who presented with vomiting and passing out found to have AKI.   Patient was in his usual state of health until before admission when he had a hot dog, immediately tasted foul, and within a short time he started vomiting.  He had abdominal cramps and frequent diarrhea ovenright, passed out twice, and kept vomiting, until he syncopized twice and was dizzy with standing.   In the ED here his initial blood pressure 78/57, creatinine was greater than 5, potassium was low.  He was started on IV fluids,  IV potassium and admitted.      PRINCIPAL HOSPITAL DIAGNOSIS: Acute kidney injury     Discharge Diagnoses:  Acute kidney injury on CKD stage IIIb Non-anion gap metabolic acidosis due to diarrhea Started on fluids.  ACE inhibitor and HCTZ were held.  His urinalysis was notable for bland casts, relatively low urine sodium.   His creatinine improved faster than expected, and is down to 2.2 by the morning.  He was making light yellow urine, with good volume, and felt completely better without vomiting or dizziness.     He has reliable PCP access and so was discharged with close PCP follow-up for labs and resumption of ACEi.  Lisinopril, HCTZ held at d/c.       Hyponatremia Due to hypovolemia, mild asymptomatic.  Hypokalemia Hypomagnesemia Replaced and improved.  Repeat in 1 week   Hypertension Blood pressure improved to normal with IV fluids.  Given his hypotension on admission, amlodipine and metoprolol (in addition to lisinopril and HCTZ ) were recommended to be held until PCP follow-up.  Cirrhosis Compensated  Syncope This was related to hypovolemia, and he was asymptomatic at time of discharge.       Discharge Instructions                General: Pt is alert, awake, not in acute distress Cardiovascular: RRR, nl S1-S2, no murmurs appreciated.   No LE edema.   Respiratory: Normal respiratory rate and rhythm.  CTAB without rales  or wheezes. Abdominal: Abdomen soft and non-tender.  No distension or HSM.   Neuro/Psych: Strength symmetric in upper and lower extremities.  Judgment and insight appear normal.        Hgb A1c Recent Labs (last 2 labs)   No results for input(s): HGBA1C in the last 72 hours.   Lipid Profile Recent Labs (last 2 labs)   No results for input(s): CHOL, HDL, LDLCALC, TRIG, CHOLHDL, LDLDIRECT in the last 72 hours.   Thyroid function studies Recent Labs (last 2 labs)      Recent Labs    04/02/21 2024  TSH 4.033      Anemia work  up [employer] (last 2 labs)   No results for input(s): VITAMINB12, FOLATE, FERRITIN, TIBC, IRON, RETICCTPCT in the last 72 hours.   Urinalysis Labs (Brief)          Component Value Date/Time    COLORURINE YELLOW 04/02/2021 1500    APPEARANCEUR HAZY (A) 04/02/2021 1500    APPEARANCEUR Clear 06/25/2018 1642    LABSPEC 1.008 04/02/2021 1500    PHURINE 5.0 04/02/2021 1500    GLUCOSEU NEGATIVE 04/02/2021 1500    HGBUR SMALL (A) 04/02/2021 1500    HGBUR negative 03/16/2008 0932    BILIRUBINUR NEGATIVE 04/02/2021 1500    BILIRUBINUR Negative 06/25/2018 1642    KETONESUR NEGATIVE 04/02/2021 1500    PROTEINUR NEGATIVE 04/02/2021 1500    UROBILINOGEN 0.2 03/19/2018 1557    UROBILINOGEN 1.0 12/08/2014 0912    NITRITE NEGATIVE 04/02/2021 1500    LEUKOCYTESUR NEGATIVE 04/02/2021 1500      Sepsis Labs Last Labs   Invalid input(s): PROCALCITONIN,  WBC,  LACTICIDVEN   Microbiology        Recent Results (from the past 240 hour(s))  Resp Panel by RT-PCR (Flu A&B, Covid) Nasopharyngeal Swab     Status: None    Collection Time: 04/02/21  2:31 PM    Specimen: Nasopharyngeal Swab; Nasopharyngeal(NP) swabs in vial transport medium  Result Value Ref Range Status    SARS Coronavirus 2 by RT PCR NEGATIVE NEGATIVE Final      Comment: (NOTE) SARS-CoV-2 target nucleic acids are NOT DETECTED.   The SARS-CoV-2 RNA is generally detectable in upper respiratory specimens during the acute phase of infection. The lowest concentration of SARS-CoV-2 viral copies this assay can detect is 138 copies/mL. A negative result does not preclude SARS-Cov-2 infection and should not be used as the sole basis for treatment or other patient management decisions. A negative result may occur with improper specimen collection/handling, submission of specimen other than nasopharyngeal swab, presence of viral mutation(s) within the areas targeted by this assay, and inadequate number of viral copies(<138 copies/mL). A  negative result must be combined with clinical observations, patient history, and epidemiological information. The expected result is Negative.   Fact Sheet for Patients: EntrepreneurPulse.com.au   Fact Sheet for Healthcare Providers: IncredibleEmployment.be   This test is no t yet approved or cleared by the [place] FDA and has been authorized for detection and/or diagnosis of SARS-CoV-2 by FDA under an Emergency Use Authorization (EUA). This EUA will remain in effect (meaning this test can be used) for the duration of the COVID-19 declaration under Section 564(b)(1) of the Act, 21 U.S.C.section 360bbb-3(b)(1), unless the authorization is terminated or revoked sooner.          Influenza A by PCR NEGATIVE NEGATIVE Final    Influenza B by PCR NEGATIVE NEGATIVE Final      Comment: (NOTE) The  Xpert Xpress SARS-CoV-2/FLU/RSV plus assay is intended as an aid in the diagnosis of influenza from Nasopharyngeal swab specimens and should not be used as a sole basis for treatment. Nasal washings and aspirates are unacceptable for Xpert Xpress SARS-CoV-2/FLU/RSV testing.   Fact Sheet for Patients: EntrepreneurPulse.com.au   Fact Sheet for Healthcare Providers: IncredibleEmployment.be   This test is not yet approved or cleared by the [place] FDA and has been authorized for detection and/or diagnosis of SARS-CoV-2 by FDA under an Emergency Use Authorization (EUA). This EUA will remain in effect (meaning this test can be used) for the duration of the COVID-19 declaration under Section 564(b)(1) of the Act, 21 U.S.C. section 360bbb-3(b)(1), unless the authorization is terminated or revoked.   Performed at [facility], [place] [address], [place], [place] [address]                       SIGNED:     Edwin Dada, MD     [facility] 04/03/2021, 3:43 PM             Outpatient Medications Prior to Visit  Medication Sig Dispense Refill   albuterol (VENTOLIN HFA) 108 (90 Base) MCG/ACT inhaler Inhale 1-2 puffs into the lungs every 6 (six) hours as needed for wheezing or shortness of breath. 18 g 3   aspirin 81 MG chewable tablet Chew 81 mg by mouth daily.     atorvastatin (LIPITOR) 80 MG tablet Take 80 mg by mouth daily.     gabapentin (NEURONTIN) 300 MG capsule Take 1 capsule (300 mg total) by mouth 2 (two) times daily. 180 capsule 1   MAGNESIUM-OXIDE 400 (241.3 Mg) MG tablet TAKE 1 TABLET BY MOUTH EVERY DAY (Patient taking differently: Take 400 mg by mouth daily.) 30 tablet 0   omeprazole (PRILOSEC) 40 MG capsule Take 1 capsule (40 mg total) by mouth daily. 90 capsule 1   risperiDONE (RISPERDAL) 1 MG tablet Take 1 tablet (1 mg total) by mouth at bedtime. 90 tablet 1   sildenafil (VIAGRA) 100 MG tablet TAKE 1/2 (ONE-HALF) TABLET BY MOUTH ONCE DAILY AS NEEDED FOR  ERECTILE  DYSFUNCTION (Patient taking differently: Take 50 mg by mouth as needed for erectile dysfunction.) 30 tablet 2   topiramate (TOPAMAX) 50 MG tablet 1 tablet     traMADol (ULTRAM) 50 MG tablet Take 1 tablet (50 mg total) by mouth every 12 (twelve) hours as needed. 60 tablet 2   traZODone (DESYREL) 100 MG tablet Take 0.5-1 tablets (50-100 mg total) by mouth at bedtime. 60 tablet 1   Ubrogepant (UBRELVY) 100 MG TABS TAKE 1 TABLET(100 MG) BY MOUTH 1 TIME EVERY 2 HOURS AS DIRECTED AS NEEDED. MAXIMUM 2 TABLETS IN 24 HOURS (Patient taking differently: Take 2 tablets by mouth daily as needed (Migraine).) 16 tablet 5   valACYclovir (VALTREX) 500 MG tablet Take 1 tablet (500 mg total) by mouth 2 (two) times daily. 6 tablet 3   No facility-administered medications prior to visit.    ROS Review of Systems  Constitutional:  Negative for diaphoresis and fatigue.  HENT: Negative.    Eyes: Negative.   Respiratory:  Negative for cough, chest tightness, shortness of breath and wheezing.    Cardiovascular:  Negative for chest pain, palpitations and leg swelling.  Gastrointestinal:  Negative for abdominal pain, blood in stool, constipation, diarrhea, nausea and vomiting.  Endocrine: Negative.   Genitourinary: Negative.  Negative for difficulty urinating, dysuria and hematuria.  Musculoskeletal:  Negative for arthralgias and myalgias.  Skin: Negative.  Negative for color change and pallor.  Neurological:  Negative for dizziness, weakness and light-headedness.  Hematological:  Negative for adenopathy. Does not bruise/bleed easily.  Psychiatric/Behavioral: Negative.     Objective:  BP 124/74 (BP Location: Right Arm, Patient Position: Sitting, Cuff Size: Large)   Pulse 76   Temp 98 F (36.7 C) (Oral)   Resp 16   Ht 5\' 6"  (1.676 m)   Wt 163 lb (73.9 kg)   SpO2 97%   BMI 26.31 kg/m   BP Readings from Last 3 Encounters:  04/11/21 124/74  04/10/21 140/82  04/05/21 (!) 142/74    Wt Readings from Last 3 Encounters:  04/11/21 163 lb (73.9 kg)  04/10/21 162 lb 6.4 oz (73.7 kg)  04/05/21 159 lb (72.1 kg)    Physical Exam Vitals reviewed.  Constitutional:      Appearance: Normal appearance.  HENT:     Mouth/Throat:     Mouth: Mucous membranes are moist.  Eyes:     General: No scleral icterus.    Conjunctiva/sclera: Conjunctivae normal.  Cardiovascular:     Rate and Rhythm: Normal rate and regular rhythm.     Heart sounds: No murmur heard. Pulmonary:     Effort: Pulmonary effort is normal.     Breath sounds: No stridor. No wheezing, rhonchi or rales.  Abdominal:     General: Abdomen is flat. Bowel sounds are normal. There is no distension.     Palpations: Abdomen is soft. There is no hepatomegaly, splenomegaly or mass.     Tenderness: There is no abdominal tenderness.  Musculoskeletal:        General: Normal range of motion.     Cervical back: Neck supple.     Right lower leg: No edema.     Left lower leg: No edema.  Lymphadenopathy:     Cervical: No  cervical adenopathy.  Skin:    General: Skin is warm and dry.  Neurological:     General: No focal deficit present.     Mental Status: He is alert and oriented to person, place, and time.  Psychiatric:        Mood and Affect: Mood normal.        Behavior: Behavior normal.    Lab Results  Component Value Date   WBC 9.3 04/11/2021   HGB 12.4 (L) 04/11/2021   HCT 36.0 (L) 04/11/2021   PLT 231.0 04/11/2021   GLUCOSE 90 04/11/2021   CHOL 100 12/03/2020   TRIG 144 12/03/2020   HDL 27 (L) 12/03/2020   LDLDIRECT 156 (H) 06/09/2011   LDLCALC 48 12/03/2020   ALT 13 04/02/2021   AST 18 04/02/2021   NA 139 04/11/2021   K 3.7 04/11/2021   CL 108 04/11/2021   CREATININE 1.24 04/11/2021   BUN 17 04/11/2021   CO2 24 04/11/2021   TSH 4.033 04/02/2021   PSA 0.33 08/21/2009   INR 1.17 04/19/2015   HGBA1C 5.8 (H) 12/03/2020    DG Chest 1 View  Result Date: 04/02/2021 CLINICAL DATA:  Per roommate pt woke up and found himself on floor today in bathroom by toilet. Roommate reports pt then fell again in hall, no complaints at this time EXAM: CHEST  1 VIEW COMPARISON:  Chest x-ray 02/27/2021, CT chest 09/27/2020. FINDINGS: The heart size and mediastinal contours are unchanged. Aortic calcification Nodular density overlying the peripheral right mid lung zone. Query peripheral hazy airspace opacity within the left mid  lung zone versus overlying soft tissues. Slightly coarsened interstitial markings with no overt pulmonary edema. No pleural effusion. No pneumothorax. No acute osseous abnormality. IMPRESSION: 1. Nodular density overlying the peripheral right mid lung zone. Recommend CT chest for further evaluation. 2. Query peripheral hazy airspace opacity within the left mid lung zone versus overlying soft tissues. Electronically Signed   By: Iven Finn M.D.   On: 04/02/2021 16:18   CT HEAD WO CONTRAST  Result Date: 04/02/2021 CLINICAL DATA:  Recurrent syncope. EXAM: CT HEAD WITHOUT CONTRAST  TECHNIQUE: Contiguous axial images were obtained from the base of the skull through the vertex without intravenous contrast. COMPARISON:  Head CT 07/19/2015 FINDINGS: Brain: No intracranial hemorrhage, mass effect, or midline shift. Normal for age atrophy. No hydrocephalus. Incidental cavum septum pellucidum. The basilar cisterns are patent. Minimal periventricular chronic small vessel ischemia. No evidence of territorial infarct or acute ischemia. No extra-axial or intracranial fluid collection. Vascular: Atherosclerosis of skullbase vasculature without hyperdense vessel or abnormal calcification. Skull: No fracture or focal lesion. Sinuses/Orbits: Paranasal sinuses and mastoid air cells are clear. The visualized orbits are unremarkable. Other: None. IMPRESSION: 1. No acute intracranial abnormality. 2. Normal for age atrophy and minimal chronic small vessel ischemia. Electronically Signed   By: Keith Rake M.D.   On: 04/02/2021 16:45    Assessment & Plan:   Luiz was seen today for anemia and hypertension.  Diagnoses and all orders for this visit:  Deficiency anemia- I will screen for vit deficiencies. -     Iron; Future -     Vitamin B12; Future -     Vitamin B1; Future -     Folate; Future -     Ferritin; Future -     Reticulocytes; Future -     Reticulocytes -     Ferritin -     Folate -     Vitamin B1 -     Vitamin B12 -     Iron  HYPERTENSION, BENIGN SYSTEMIC- His BP is well controlled. -     CBC with Differential/Platelet; Future -     Basic metabolic panel; Future -     Magnesium; Future -     Magnesium -     Basic metabolic panel -     CBC with Differential/Platelet  Hypokalemia -     Magnesium; Future -     Magnesium  Vitamin B12 deficiency anemia due to intrinsic factor deficiency- Will start parenteral B12 replacement therapy.  Stage 3a chronic kidney disease ([place])- His renal fxn has improved.  I am having [name] maintain his aspirin, valACYclovir,  sildenafil, topiramate, Ubrelvy, traZODone, MAGnesium-Oxide, risperiDONE, omeprazole, gabapentin, traMADol, albuterol, and atorvastatin.  No orders of the defined types were placed in this encounter.    Follow-up: Return in about 3 months (around 07/12/2021).  Scarlette Calico, MD

## 2021-04-12 ENCOUNTER — Other Ambulatory Visit: Payer: Self-pay

## 2021-04-12 ENCOUNTER — Ambulatory Visit (INDEPENDENT_AMBULATORY_CARE_PROVIDER_SITE_OTHER): Payer: Medicare Other

## 2021-04-12 DIAGNOSIS — E538 Deficiency of other specified B group vitamins: Secondary | ICD-10-CM

## 2021-04-12 MED ORDER — CYANOCOBALAMIN 1000 MCG/ML IJ SOLN
1000.0000 ug | Freq: Once | INTRAMUSCULAR | Status: AC
  Administered 2021-04-12: 1000 ug via INTRAMUSCULAR

## 2021-04-12 NOTE — Progress Notes (Addendum)
Patient here for first B12 injection per Dr. Ronnald Ramp.  B12 1000 mcg given IM in right arm.  Patient tolerated injection well today.  Patient to have 2 other weekly injections before switching over to monthly.  I have reviewed and agree

## 2021-04-13 ENCOUNTER — Other Ambulatory Visit: Payer: Self-pay | Admitting: Neurology

## 2021-04-14 DIAGNOSIS — N1831 Chronic kidney disease, stage 3a: Secondary | ICD-10-CM | POA: Insufficient documentation

## 2021-04-14 DIAGNOSIS — D510 Vitamin B12 deficiency anemia due to intrinsic factor deficiency: Secondary | ICD-10-CM | POA: Insufficient documentation

## 2021-04-15 ENCOUNTER — Other Ambulatory Visit: Payer: Self-pay

## 2021-04-15 DIAGNOSIS — J45990 Exercise induced bronchospasm: Secondary | ICD-10-CM

## 2021-04-15 MED ORDER — ALBUTEROL SULFATE HFA 108 (90 BASE) MCG/ACT IN AERS: 1.0000 | INHALATION_SPRAY | Freq: Four times a day (QID) | RESPIRATORY_TRACT | 3 refills | Status: DC | PRN

## 2021-04-16 NOTE — Progress Notes (Signed)
I, Jaime Barnes, LAT, ATC acting as a scribe for [name], MD.  Jaime Barnes is a 70 y.o. male who presents to [place] at [facility] today for f/u R shoulder pain thought to be due to subacromial bursitis and R carpal tunnel syndrome. [place] and recalls an incident where he injured his R shoulder. Pt was last seen by Dr. [name] on 03/06/21 and was given a R subacromial steroid injection and was referred to PT, but has not completed any visits (no-show for initial eval appointment 6/9), and was advised to wear night splints for his wrists. Of note, pt was hospitalized due to a syncope episode on 04/02/21 after experiencing bouts of vomiting and diarrhea. Today, pt reports his R shoulder/arm isn't bothering him. He c/o numbness and a cold sensation in his R 5th finger.  Dx imaging: 02/27/21 R shoulder XR  Pertinent review of systems: No fevers or chills  Relevant historical information: History of peripheral arterial disease and coronary artery disease.  Carpal tunnel syndrome reported on problem list but no nerve conduction study present in medical chart.   Exam:  BP (!) 160/98 (BP Location: Right Arm, Patient Position: Sitting, Cuff Size: Normal)   Pulse 72   Ht 5\' 6"  (1.676 m)   Wt 159 lb (72.1 kg)   SpO2 99%   BMI 25.66 kg/m  General: Well Developed, well nourished, and in no acute distress.   MSK: Right hand.  Fingernails very slightly dusky compared to left but very subtle. Right fifth digit very slightly cooler to touch compared to left but not definitively cold to touch. Capillary refill right fifth digit very slightly delayed compared to left. Decreased to absent pulse ulnar artery.  Present pulse radial artery. Sensation intact.  Negative Tinel's cubital tunnel and Guyon's canal.  Normal cervical motion.  Negative Spurling's test.  Reflexes and strength intact upper extremity bilaterally.    Lab  and Radiology Results  X-ray images cervical spine obtained March 22, 2020 personally independently interpreted. Diffuse degenerative cervical DDD.  No acute fractures.      Assessment and Plan: 70 y.o. male with subjective feeling of fifth digit cold and numbness associate with paresthesias.  Associated with decreased ulnar artery pulse and decreased capillary refill.  This is concerning for potential upper extremity peripheral artery disease.  He does have a known history of PAD.  Plan for arterial vascular ultrasound.  Additionally plan to proceed with nerve conduction study as he does have a reported history of carpal tunnel syndrome and may have some ulnar nerve issue as well.  Recheck in about a month or so.   PDMP not reviewed this encounter. Orders Placed This Encounter  Procedures   Ambulatory referral to Neurology    Referral Priority:   Routine    Referral Type:   Consultation    Referral Reason:   Specialty Services Required    Requested Specialty:   Neurology    Number of Visits Requested:   1   NCV with EMG(electromyography)    Standing Status:   Future    Standing Expiration Date:   04/17/2022    Order Specific Question:   Where should this test be performed?    Answer:   [facility]   No orders of the defined types were placed in this encounter.    Discussed warning signs or symptoms. Please see discharge instructions. Patient expresses understanding.   The above  documentation has been reviewed and is accurate and complete [name], M.D.

## 2021-04-17 ENCOUNTER — Telehealth: Payer: Self-pay

## 2021-04-17 ENCOUNTER — Ambulatory Visit (INDEPENDENT_AMBULATORY_CARE_PROVIDER_SITE_OTHER): Payer: Medicare Other | Admitting: Family Medicine

## 2021-04-17 ENCOUNTER — Encounter: Payer: Self-pay | Admitting: Podiatry

## 2021-04-17 ENCOUNTER — Ambulatory Visit (INDEPENDENT_AMBULATORY_CARE_PROVIDER_SITE_OTHER): Payer: Medicare Other | Admitting: Podiatry

## 2021-04-17 ENCOUNTER — Other Ambulatory Visit: Payer: Self-pay

## 2021-04-17 VITALS — BP 160/98 | HR 72 | Ht 66.0 in | Wt 159.0 lb

## 2021-04-17 DIAGNOSIS — R209 Unspecified disturbances of skin sensation: Secondary | ICD-10-CM | POA: Diagnosis not present

## 2021-04-17 DIAGNOSIS — M79675 Pain in left toe(s): Secondary | ICD-10-CM | POA: Diagnosis not present

## 2021-04-17 DIAGNOSIS — I739 Peripheral vascular disease, unspecified: Secondary | ICD-10-CM | POA: Diagnosis not present

## 2021-04-17 DIAGNOSIS — G5601 Carpal tunnel syndrome, right upper limb: Secondary | ICD-10-CM

## 2021-04-17 DIAGNOSIS — N183 Chronic kidney disease, stage 3 unspecified: Secondary | ICD-10-CM

## 2021-04-17 DIAGNOSIS — M79674 Pain in right toe(s): Secondary | ICD-10-CM | POA: Diagnosis not present

## 2021-04-17 DIAGNOSIS — R202 Paresthesia of skin: Secondary | ICD-10-CM

## 2021-04-17 DIAGNOSIS — B351 Tinea unguium: Secondary | ICD-10-CM | POA: Diagnosis not present

## 2021-04-17 NOTE — Telephone Encounter (Signed)
Key: [id number]

## 2021-04-17 NOTE — Telephone Encounter (Addendum)
On 04/11/21, B12 179. Verbal order per Dr Ronnald Ramp: b12 injections weekly for 3 weeks, then monthly. 1st injection given 04/12/21

## 2021-04-17 NOTE — Telephone Encounter (Signed)
Approved through 09/28/2021 

## 2021-04-17 NOTE — Progress Notes (Signed)
This patient returns to my office for at risk foot care.  This patient requires this care by a professional since this patient will be at risk due to having chronic kidney disease  This patient is unable to cut nails himself since the patient cannot reach his nails.These nails are painful walking and wearing shoes.  This patient presents for at risk foot care today.  General Appearance  Alert, conversant and in no acute stress.  Vascular  Dorsalis pedis and posterior tibial  pulses are palpable  bilaterally.  Capillary return is within normal limits  bilaterally. Temperature is within normal limits  bilaterally.  Neurologic  Senn-Weinstein monofilament wire test within normal limits  bilaterally. Muscle power within normal limits bilaterally.  Nails Thick disfigured discolored nails with subungual debris  from hallux to fifth toes bilaterally. No evidence of bacterial infection or drainage bilaterally.  Orthopedic  No limitations of motion  feet .  No crepitus or effusions noted.  No bony pathology or digital deformities noted.  Skin  normotropic skin with no porokeratosis noted bilaterally.  No signs of infections or ulcers noted.     Onychomycosis  Pain in right toes  Pain in left toes  Consent was obtained for treatment procedures.   Mechanical debridement of nails 1-5  bilaterally performed with a nail nipper.  Filed with dremel without incident.    Return office visit   10 weeks                  Told patient to return for periodic foot care and evaluation due to potential at risk complications.   Gardiner Barefoot DPM

## 2021-04-17 NOTE — Patient Instructions (Signed)
Thank you for coming in today.   You should hear about the artery blood flow test soon.   You should hear from neurology soon as well.   Let me know if you have a problem or dont hear anything   If the blood flow test comes back bad they will automatically set you up for more accurate tests and likely an appointment with the vascular surgeon.   Keep me updated.

## 2021-04-18 ENCOUNTER — Encounter: Payer: Self-pay | Admitting: Neurology

## 2021-04-19 ENCOUNTER — Other Ambulatory Visit: Payer: Self-pay

## 2021-04-19 ENCOUNTER — Ambulatory Visit (INDEPENDENT_AMBULATORY_CARE_PROVIDER_SITE_OTHER): Payer: Medicare Other

## 2021-04-19 DIAGNOSIS — E538 Deficiency of other specified B group vitamins: Secondary | ICD-10-CM

## 2021-04-19 MED ORDER — CYANOCOBALAMIN 1000 MCG/ML IJ SOLN
1000.0000 ug | INTRAMUSCULAR | Status: AC
  Administered 2021-04-19: 1000 ug via INTRAMUSCULAR

## 2021-04-19 NOTE — Progress Notes (Signed)
Pt here for weekly B12 injection per Dr. Ronnald Ramp  B12 1067mg given IM, and pt tolerated injection well.  Next B12 injection scheduled for 04/26/21

## 2021-04-22 ENCOUNTER — Other Ambulatory Visit: Payer: Self-pay | Admitting: Internal Medicine

## 2021-04-22 ENCOUNTER — Telehealth: Payer: Self-pay | Admitting: Internal Medicine

## 2021-04-22 DIAGNOSIS — B009 Herpesviral infection, unspecified: Secondary | ICD-10-CM

## 2021-04-22 MED ORDER — VALACYCLOVIR HCL 500 MG PO TABS
500.0000 mg | ORAL_TABLET | Freq: Two times a day (BID) | ORAL | 3 refills | Status: DC
Start: 2021-04-22 — End: 2021-05-30

## 2021-04-22 NOTE — Telephone Encounter (Signed)
1.Medication Requested:  valACYclovir (VALTREX) 500 MG tablet  2. Pharmacy (Name, [place], [facility]): [address], [place] Phone:  [phone]  Fax:  [phone]     3. On Med List: Y  4. Last Visit with PCP: 04/11/2021  5. Next visit date with PCP: 07/11/2021   Agent: Please be advised that RX refills may take up to 3 business days. We ask that you follow-up with your pharmacy.   **Patient is unable to get his inhaler, per pharmacy it is not covered by the insurance. Patient is going to call his insurance why they won't cover it. Asking for an limited supply of an alternative to be ordered to hold him.   (albuterol (VENTOLIN HFA) 108 (90 Base) MCG/ACT inhaler)

## 2021-04-23 LAB — RETICULOCYTES
ABS Retic: 63520 cells/uL (ref 25000–90000)
Retic Ct Pct: 1.6 %

## 2021-04-23 LAB — VITAMIN B1: Vitamin B1 (Thiamine): 8 nmol/L (ref 8–30)

## 2021-04-24 ENCOUNTER — Ambulatory Visit (INDEPENDENT_AMBULATORY_CARE_PROVIDER_SITE_OTHER): Payer: Medicare Other | Admitting: Neurology

## 2021-04-24 ENCOUNTER — Other Ambulatory Visit: Payer: Self-pay

## 2021-04-24 DIAGNOSIS — G5621 Lesion of ulnar nerve, right upper limb: Secondary | ICD-10-CM

## 2021-04-24 DIAGNOSIS — G5611 Other lesions of median nerve, right upper limb: Secondary | ICD-10-CM

## 2021-04-24 DIAGNOSIS — I739 Peripheral vascular disease, unspecified: Secondary | ICD-10-CM

## 2021-04-24 DIAGNOSIS — G5601 Carpal tunnel syndrome, right upper limb: Secondary | ICD-10-CM | POA: Diagnosis not present

## 2021-04-24 DIAGNOSIS — R209 Unspecified disturbances of skin sensation: Secondary | ICD-10-CM | POA: Diagnosis not present

## 2021-04-24 DIAGNOSIS — R202 Paresthesia of skin: Secondary | ICD-10-CM

## 2021-04-24 NOTE — Procedures (Signed)
[facility]  [address], [address]  [place], [place] [address] Tel: [phone] Fax:  [phone] Test Date:  04/24/2021  Patient: Jaime Barnes DOB: 05-28-1951 Physician: [name], DO  Sex: Male Height: '5\' 6"'$  Ref Phys: [name], M.D.  ID#: [id number]   Technician:    Patient Complaints: This is a 70 year old man referred for evaluation of right hand numbness and tingling.  NCV & EMG Findings: Extensive electrodiagnostic testing of the right upper extremity shows:  Right median and ulnar sensory responses show prolonged latency (R4.0, R3.7 ms).  Right radial sensory responses within normal limits.   Right median motor response shows prolonged latency (4.5 ms).  Right ulnar motor response shows slowed conduction velocity across the elbow at the first dorsal interosseous and abductor digiti minimi muscles (A Elbow-B Elbow, 37-43 m/s).   There is no evidence of active or chronic motor axonal loss changes affecting any of the tested muscles.  Motor unit configuration and recruitment pattern is within normal limits.    Impression: Right ulnar neuropathy with slowing across the elbow, purely demyelinating, moderate.   Right median neuropathy at or distal to the wrist (moderate), consistent with a clinical diagnosis of carpal tunnel syndrome.     ___________________________ [name], DO    Nerve Conduction Studies Anti Sensory Summary Table   Stim Site NR Peak (ms) Norm Peak (ms) P-T Amp (V) Norm P-T Amp  Right Median Anti Sensory (2nd Digit)  32C  Wrist    4.0 <3.8 19.6 >10  Right Radial Anti Sensory (Base 1st Digit)  32C  Wrist    2.5 <2.8 28.5 >10  Right Ulnar Anti Sensory (5th Digit)  32C  Wrist    3.7 <3.2 8.6 >5   Motor Summary Table   Stim Site NR Onset (ms) Norm Onset (ms) O-P Amp (mV) Norm O-P Amp Site1 Site2 Delta-0 (ms) Dist (cm) Vel (m/s) Norm Vel (m/s)  Right Median Motor (Abd Poll Brev)  32C  Wrist    4.5 <4.0 7.1 >5 Elbow Wrist 5.3  29.0 55 >50  Elbow    9.8  6.9         Right Ulnar Motor (Abd Dig Minimi)  32C  Wrist    2.9 <3.1 9.3 >7 B Elbow Wrist 4.4 23.0 52 >50  B Elbow    7.3  8.5  A Elbow B Elbow 2.3 10.0 43 >50  A Elbow    9.6  8.0         Right Ulnar (FDI) Motor (1st DI)  32C  Wrist    5.5 <4.5 8.5 >7 B Elbow Wrist 4.0 23.0 58 >50  B Elbow    9.5  7.4  A Elbow B Elbow 2.7 10.0 37 >50  A Elbow    12.2  6.1          EMG   Side Muscle Ins Act Fibs Psw Fasc Number Recrt Dur Dur. Amp Amp. Poly Poly. Comment  Right 1stDorInt Nml Nml Nml Nml Nml Nml Nml Nml Nml Nml Nml Nml N/A  Right Abd Poll Brev Nml Nml Nml Nml Nml Nml Nml Nml Nml Nml Nml Nml N/A  Right PronatorTeres Nml Nml Nml Nml Nml Nml Nml Nml Nml Nml Nml Nml N/A  Right Biceps Nml Nml Nml Nml Nml Nml Nml Nml Nml Nml Nml Nml N/A  Right Triceps Nml Nml Nml Nml Nml Nml Nml Nml Nml Nml Nml Nml N/A  Right Deltoid Nml Nml Nml Nml Nml  Nml Nml Nml Nml Nml Nml Nml N/A  Right ABD Dig Min Nml Nml Nml Nml Nml Nml Nml Nml Nml Nml Nml Nml N/A  Right FlexCarpiUln Nml Nml Nml Nml Nml Nml Nml Nml Nml Nml Nml Nml N/A      Waveforms:

## 2021-04-25 ENCOUNTER — Encounter: Payer: Self-pay | Admitting: Internal Medicine

## 2021-04-26 ENCOUNTER — Ambulatory Visit (INDEPENDENT_AMBULATORY_CARE_PROVIDER_SITE_OTHER): Payer: Medicare Other | Admitting: *Deleted

## 2021-04-26 ENCOUNTER — Other Ambulatory Visit: Payer: Self-pay

## 2021-04-26 DIAGNOSIS — E538 Deficiency of other specified B group vitamins: Secondary | ICD-10-CM

## 2021-04-26 MED ORDER — CYANOCOBALAMIN 1000 MCG/ML IJ SOLN
1000.0000 ug | Freq: Once | INTRAMUSCULAR | Status: AC
  Administered 2021-04-26: 1000 ug via INTRAMUSCULAR

## 2021-04-26 NOTE — Progress Notes (Signed)
Pls cosign for B12 inj in absence of PCP../lmb   

## 2021-04-29 NOTE — Progress Notes (Signed)
Nerve conduction study is back showing that you do have carpal tunnel syndrome as well as some problems with the ulnar nerve as well.  But I do not see that you have had the blood flow test into the arm yet.  Have you been contacted yet?  Please schedule a follow-up appointment with me in the near future to go over the results of the nerve conduction study in full detail and discuss next steps and treatment plan and options.

## 2021-05-02 NOTE — Progress Notes (Signed)
   I, Jaime Barnes, LAT, ATC, am serving as scribe for [name].  Jaime Barnes is a 70 y.o. male who presents to [place] at [facility] today for f/u of R hand paresthesias and to review his R UE NCV/EMG test.  He was last seen by Dr. Georgina Barnes on 04/17/21 and was referred for both a NCV/EMG and arterial US.  Since his last visit, pt reports that he has had no change in his R hand symptoms, continuing to have numbness/tingling in his R 5th finger.  He has not had the vascular ultrasound performed yet.  Diagnostic testing: R UE NCV/EMG- 04/24/21  Pertinent review of systems: No fevers or chills  Relevant historical information: CAD.  PAD.  History of carpal tunnel syndrome.   Exam:  BP 104/70 (BP Location: Right Arm, Patient Position: Sitting, Cuff Size: Normal)   Pulse 68   Ht '5\' 6"'$  (1.676 m)   Wt 160 lb 6.4 oz (72.8 kg)   SpO2 95%   BMI 25.89 kg/m  General: Well Developed, well nourished, and in no acute distress.   MSK: Right arm.  Minimal positive Tinel's cubital tunnel.  Minimal positive Tinel's carpal tunnel. Slight decrease sensation fifth digit. Slight decreased pulses or artery.  Slight decreased capillary refill fifth digit..    Lab and Radiology Results  NCV/EMG 04/24/21   NCV & EMG Findings: Extensive electrodiagnostic testing of the right upper extremity shows: Right median and ulnar sensory responses show prolonged latency (R4.0, R3.7 ms).  Right radial sensory responses within normal limits.   Right median motor response shows prolonged latency (4.5 ms).  Right ulnar motor response shows slowed conduction velocity across the elbow at the first dorsal interosseous and abductor digiti minimi muscles (A Elbow-B Elbow, 37-43 m/s).   There is no evidence of active or chronic motor axonal loss changes affecting any of the tested muscles.  Motor unit configuration and recruitment pattern is within normal limits.     Impression: Right ulnar neuropathy  with slowing across the elbow, purely demyelinating, moderate.   Right median neuropathy at or distal to the wrist (moderate), consistent with a clinical diagnosis of carpal tunnel syndrome.        Assessment and Plan: 70 y.o. male with right arm paresthesias.  Patient has clear moderate abnormalities of both the median and ulnar nerve on his EMG.  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  Continue gabapentin.  Recheck in a month.  If not better from that perspective will consider injections.  Additionally will continue to pursue arterial vascular ultrasound.  This test was reordered today and the location was clarified.  If he still has trouble he will let me know.  This should be done before he comes back in 1 month.     Discussed warning signs or symptoms. Please see discharge instructions. Patient expresses understanding.   The above documentation has been reviewed and is accurate and complete [name], M.D. Total encounter time 20 minutes including face-to-face time with the patient and, reviewing past medical record, and charting on the date of service.

## 2021-05-06 ENCOUNTER — Other Ambulatory Visit: Payer: Self-pay | Admitting: Gastroenterology

## 2021-05-06 ENCOUNTER — Other Ambulatory Visit: Payer: Self-pay

## 2021-05-06 ENCOUNTER — Encounter: Payer: Self-pay | Admitting: Family Medicine

## 2021-05-06 ENCOUNTER — Ambulatory Visit (INDEPENDENT_AMBULATORY_CARE_PROVIDER_SITE_OTHER): Payer: Medicare Other | Admitting: Family Medicine

## 2021-05-06 VITALS — BP 104/70 | HR 68 | Ht 66.0 in | Wt 160.4 lb

## 2021-05-06 DIAGNOSIS — G5601 Carpal tunnel syndrome, right upper limb: Secondary | ICD-10-CM

## 2021-05-06 DIAGNOSIS — K7030 Alcoholic cirrhosis of liver without ascites: Secondary | ICD-10-CM

## 2021-05-06 DIAGNOSIS — R209 Unspecified disturbances of skin sensation: Secondary | ICD-10-CM | POA: Diagnosis not present

## 2021-05-06 DIAGNOSIS — R202 Paresthesia of skin: Secondary | ICD-10-CM | POA: Diagnosis not present

## 2021-05-06 DIAGNOSIS — I739 Peripheral vascular disease, unspecified: Secondary | ICD-10-CM | POA: Diagnosis not present

## 2021-05-06 NOTE — Patient Instructions (Signed)
Thank you for coming in today.   You should hear about the vascular ultraosund of your arm.   Try a carpal tunnel wrist brace at bedtime.   Try the towel splint at bedtime across your elbow for cubital tunnel syndrome.   Recheck in 1 month.   If no benefit and the problem are bad enough I can injections.    Cubital Tunnel Syndrome  Cubital tunnel syndrome is a condition that causes pain and weakness of the forearm and hand. It happens when one of the nerves that runs along the inside of the elbow joint (ulnar nerve) becomes irritated. This condition is usually caused by repeated arm motionsthat are done during sports or work-related activities. What are the causes? This condition may be caused by: Increased pressure on the ulnar nerve at the elbow, arm, or forearm. This can result from: Irritation caused by repeated elbow bending. Poorly healed elbow fractures. Tumors in the elbow. These are usually noncancerous (benign). Scar tissue that develops in the elbow after an injury. Bony growths (spurs) near the ulnar nerve. Stretching of the nerve due to loose elbow ligaments. Trauma to the nerve at the elbow. What increases the risk? The following factors may make you more likely to develop this condition: Doing manual labor that requires frequent bending of the elbow. Playing sports that include repeated or strenuous throwing motions, such as baseball. Playing contact sports, such as football or lacrosse. Not warming up properly before activities. Having diabetes. Having an underactive thyroid (hypothyroidism). What are the signs or symptoms? Symptoms of this condition include: Clumsiness or weakness of the hand. Tenderness of the inner elbow. Aching or soreness of the inner elbow, forearm, or fingers, especially the little finger or the ring finger. Increased pain when forcing the elbow to bend. Reduced control when throwing objects. Tingling, numbness, or a burning feeling  inside the forearm or in part of the hand or fingers, especially the little finger or the ring finger. Sharp pains that shoot from the elbow down to the wrist and hand. The inability to grip or pinch hard. How is this diagnosed? This condition is diagnosed based on: Your symptoms and medical history. Your health care provider will also ask for details about any injury. A physical exam. You may also have tests, including: Electromyogram (EMG). This test measures electrical signals sent by your nerves into the muscles. Nerve conduction study. This test measures how well electrical signals pass through your nerves. Imaging tests, such as X-rays, ultrasound, and MRI. These tests check for possible causes of your condition. How is this treated? This condition may be treated by: Stopping the activities that are causing your symptoms to get worse. Icing and taking medicines to reduce pain and swelling. Wearing a splint to prevent your elbow from bending, or wearing an elbow pad where the ulnar nerve is closest to the skin. Working with a physical therapist in less severe cases. This may help to: Decrease your symptoms. Improve the strength and range of motion of your elbow, forearm, and hand. If these treatments do not help, surgery may be needed. Follow these instructions at home: If you have a splint: Wear the splint as told by your health care provider. Remove it only as told by your health care provider. Loosen the splint if your fingers tingle, become numb, or turn cold and blue. Keep the splint clean. If the splint is not waterproof: Do not let it get wet. Cover it with a watertight covering when you take  a bath or shower. Managing pain, stiffness, and swelling  If directed, put ice on the injured area: Put ice in a plastic bag. Place a towel between your skin and the bag. Leave the ice on for 20 minutes, 2-3 times a day. Move your fingers often to avoid stiffness and to lessen  swelling. Raise (elevate) the injured area above the level of your heart while you are sitting or lying down.  General instructions Take over-the-counter and prescription medicines only as told by your health care provider. Do any exercise or physical therapy as told by your health care provider. Do not drive or use heavy machinery while taking prescription pain medicine. If you were given an elbow pad, wear it as told by your health care provider. Keep all follow-up visits as told by your health care provider. This is important. Contact a health care provider if: Your symptoms get worse. Your symptoms do not get better with treatment. You have new pain. Your hand on the injured side feels numb or cold. Summary Cubital tunnel syndrome is a condition that causes pain and weakness of the forearm and hand. You are more likely to develop this condition if you do work or play sports that involve repeated arm movements. This condition is often treated by stopping repetitive activities, applying ice, and using anti-inflammatory medicines. In rare cases, surgery may be needed. This information is not intended to replace advice given to you by your health care provider. Make sure you discuss any questions you have with your healthcare provider. Document Revised: 02/01/2018 Document Reviewed: 02/01/2018 Elsevier Patient Education  2022 [place] Syndrome  Carpal tunnel syndrome is a condition that causes pain, weakness, and numbness in your hand and arm. Numbness is when you cannot feel an area in your body. The carpal tunnel is a narrow area that is on the palm side of your wrist. Repeated wrist motion or certain diseases may cause swelling in the tunnel. This swelling can pinch the main nerve in the wrist. This nerve is called themedian nerve. What are the causes? This condition may be caused by: Moving your hand and wrist over and over again while doing a task. Injury to  the wrist. Arthritis. A sac of fluid (cyst) or abnormal growth (tumor) in the carpal tunnel. Fluid buildup during pregnancy. Use of tools that vibrate. Sometimes the cause is not known. What increases the risk? The following factors may make you more likely to have this condition: Having a job that makes you do these things: Move your hand over and over again. Work with tools that vibrate, such as drills or sanders. Being a woman. Having diabetes, obesity, thyroid problems, or kidney failure. What are the signs or symptoms? Symptoms of this condition include: A tingling feeling in your fingers. Tingling or loss of feeling in your hand. Pain in your entire arm. This pain may get worse when you bend your wrist and elbow for a long time. Pain in your wrist that goes up your arm to your shoulder. Pain that goes down into your palm or fingers. Weakness in your hands. You may find it hard to grab and hold items. You may feel worse at night. How is this treated? This condition may be treated with: Lifestyle changes. You will be asked to stop or change the activity that caused your problem. Doing exercises and activities that make bones, muscles, and tendons stronger (physical therapy). Learning how to use your hand again (occupational therapy).  Medicines for pain and swelling. You may have injections in your wrist. A wrist splint or brace. Surgery. Follow these instructions at home: If you have a splint or brace: Wear the splint or brace as told by your doctor. Take it off only as told by your doctor. Loosen the splint if your fingers: Tingle. Become numb. Turn cold and blue. Keep the splint or brace clean. If the splint or brace is not waterproof: Do not let it get wet. Cover it with a watertight covering when you take a bath or a shower. Managing pain, stiffness, and swelling If told, put ice on the painful area: If you have a removable splint or brace, remove it as told by your  doctor. Put ice in a plastic bag. Place a towel between your skin and the bag. Leave the ice on for 20 minutes, 2-3 times per day. Do not fall asleep with the cold pack on your skin. Take off the ice if your skin turns bright red. This is very important. If you cannot feel pain, heat, or cold, you have a greater risk of damage to the area. Move your fingers often to reduce stiffness and swelling. General instructions Take over-the-counter and prescription medicines only as told by your doctor. Rest your wrist from any activity that may cause pain. If needed, talk with your boss at work about changes that can help your wrist heal. Do exercises as told by your doctor, physical therapist, or occupational therapist. Keep all follow-up visits. Contact a doctor if: You have new symptoms. Medicine does not help your pain. Your symptoms get worse. Get help right away if: You have very bad numbness or tingling in your wrist or hand. Summary Carpal tunnel syndrome is a condition that causes pain in your hand and arm. It is often caused by repeated wrist motions. Lifestyle changes and medicines are used to treat this problem. Surgery may help in very bad cases. Follow your doctor's instructions about wearing a splint, resting your wrist, keeping follow-up visits, and calling for help. This information is not intended to replace advice given to you by your health care provider. Make sure you discuss any questions you have with your healthcare provider. Document Revised: 01/26/2020 Document Reviewed: 01/26/2020 Elsevier Patient Education  [place].

## 2021-05-07 ENCOUNTER — Encounter: Payer: Self-pay | Admitting: Internal Medicine

## 2021-05-07 ENCOUNTER — Ambulatory Visit (INDEPENDENT_AMBULATORY_CARE_PROVIDER_SITE_OTHER): Payer: Medicare Other | Admitting: Internal Medicine

## 2021-05-07 VITALS — BP 138/82 | HR 65 | Temp 98.5°F | Resp 16 | Ht 66.0 in | Wt 161.0 lb

## 2021-05-07 DIAGNOSIS — D510 Vitamin B12 deficiency anemia due to intrinsic factor deficiency: Secondary | ICD-10-CM | POA: Diagnosis not present

## 2021-05-07 DIAGNOSIS — N529 Male erectile dysfunction, unspecified: Secondary | ICD-10-CM

## 2021-05-07 DIAGNOSIS — N5201 Erectile dysfunction due to arterial insufficiency: Secondary | ICD-10-CM

## 2021-05-07 DIAGNOSIS — I10 Essential (primary) hypertension: Secondary | ICD-10-CM

## 2021-05-07 NOTE — Patient Instructions (Signed)
Erectile Dysfunction Erectile dysfunction (ED) is the inability to get or keep an erection in order to have sexual intercourse. ED is considered a symptom of an underlying disorder and not considered a disease. Erectile dysfunction may include: Inability to get an erection. Lack of enough hardness of the erection to allow penetration. Loss of the erection before sex is finished. What are the causes? This condition may be caused by: Certain medicines, such as: Pain relievers. Antihistamines. Antidepressants. Blood pressure medicines. Water pills (diuretics). Ulcer medicines. Muscle relaxants. Drugs. Excessive drinking. Psychological causes, such as: Anxiety. Depression. Sadness. Exhaustion. Performance fear. Stress. Physical causes, such as: Artery problems. This may include diabetes, smoking, liver disease, or atherosclerosis. High blood pressure. Hormonal problems, such as low testosterone. Obesity. Nerve problems. This may include back or pelvic injuries, diabetes mellitus, multiple sclerosis, or Parkinson's disease. What are the signs or symptoms? Symptoms of this condition include: Inability to get an erection. Lack of enough hardness of the erection to allow penetration. Loss of the erection before sex is finished. Normal erections at some times, but with frequent unsatisfactory episodes. Low sexual satisfaction in either partner due to erection problems. A curved penis occurring with erection. The curve may cause pain or the penis may be too curved to allow for intercourse. Never having nighttime erections. How is this diagnosed? This condition is often diagnosed by: Performing a physical exam to find other diseases or specific problems with the penis. Asking you detailed questions about the problem. Performing blood tests to check for diabetes mellitus or to measure hormone levels. Performing other tests to check for underlying health conditions. Performing an  ultrasound exam to check for scarring. Performing a test to check blood flow to the penis. Doing a sleep study at home to measure nighttime erections. How is this treated? This condition may be treated by: Medicine taken by mouth to help you achieve an erection (oral medicine). Hormone replacement therapy to replace low testosterone levels. Medicine that is injected into the penis. Your health care provider may instruct you how to give yourself these injections at home. Vacuum pump. This is a pump with a ring on it. The pump and ring are placed on the penis and used to create pressure that helps the penis become erect. Penile implant surgery. In this procedure, you may receive: An inflatable implant. This consists of cylinders, a pump, and a reservoir. The cylinders can be inflated with a fluid that helps to create an erection, and they can be deflated after intercourse. A semi-rigid implant. This consists of two silicone rubber rods. The rods provide some rigidity. They are also flexible, so the penis can both curve downward in its normal position and become straight for sexual intercourse. Blood vessel surgery, to improve blood flow to the penis. During this procedure, a blood vessel from a different part of the body is placed into the penis to allow blood to flow around (bypass) damaged or blocked blood vessels. Lifestyle changes, such as exercising more, losing weight, and quitting smoking. Follow these instructions at home: Medicines  Take over-the-counter and prescription medicines only as told by your health care provider. Do not increase the dosage without first discussing it with your health care provider. If you are using self-injections, perform injections as directed by your health care provider. Make sure to avoid any veins that are on the surface of the penis. After giving an injection, apply pressure to the injection site for 5 minutes.  General instructions Exercise regularly, as  directed by your health care provider. Work with your health care provider to lose weight, if needed. Do not use any products that contain nicotine or tobacco, such as cigarettes and e-cigarettes. If you need help quitting, ask your health care provider. Before using a vacuum pump, read the instructions that come with the pump and discuss any questions with your health care provider. Keep all follow-up visits as told by your health care provider. This is important. Contact a health care provider if: You feel nauseous. You vomit. Get help right away if: You are taking oral or injectable medicines and you have an erection that lasts longer than 4 hours. If your health care provider is unavailable, go to the nearest emergency room for evaluation. An erection that lasts much longer than 4 hours can result in permanent damage to your penis. You have severe pain in your groin or abdomen. You develop redness or severe swelling of your penis. You have redness spreading up into your groin or lower abdomen. You are unable to urinate. You experience chest pain or a rapid heart beat (palpitations) after taking oral medicines. Summary Erectile dysfunction (ED) is the inability to get or keep an erection during sexual intercourse. This problem can usually be treated successfully. This condition is diagnosed based on a physical exam, your symptoms, and tests to determine the cause. Treatment varies depending on the cause and may include medicines, hormone therapy, surgery, or a vacuum pump. You may need follow-up visits to make sure that you are using your medicines or devices correctly. Get help right away if you are taking or injecting medicines and you have an erection that lasts longer than 4 hours. This information is not intended to replace advice given to you by your health care provider. Make sure you discuss any questions you have with your healthcare provider. Document Revised: 09/04/2020 Document  Reviewed: 06/01/2020 Elsevier Patient Education  [place].

## 2021-05-07 NOTE — Progress Notes (Signed)
Subjective:  Patient ID: Jerramie Bendavid, male    DOB: September 06, 1951  Age: 70 y.o. MRN: [id number]  CC: Anemia  This visit occurred during the SARS-CoV-2 public health emergency.  Safety protocols were in place, including screening questions prior to the visit, additional usage of staff PPE, and extensive cleaning of exam room while observing appropriate contact time as indicated for disinfecting solutions.    HPI Sudarshan Delpino presents for f/up -  He complains of erectile dysfunction and tells me that Viagra is not helping much.  Outpatient Medications Prior to Visit  Medication Sig Dispense Refill   albuterol (VENTOLIN HFA) 108 (90 Base) MCG/ACT inhaler Inhale 1-2 puffs into the lungs every 6 (six) hours as needed for wheezing or shortness of breath. 18 g 3   aspirin 81 MG chewable tablet Chew 81 mg by mouth daily.     atorvastatin (LIPITOR) 80 MG tablet Take 80 mg by mouth daily.     gabapentin (NEURONTIN) 300 MG capsule Take 1 capsule (300 mg total) by mouth 2 (two) times daily. 180 capsule 1   MAGNESIUM-OXIDE 400 (241.3 Mg) MG tablet TAKE 1 TABLET BY MOUTH EVERY DAY (Patient taking differently: Take 400 mg by mouth daily.) 30 tablet 0   omeprazole (PRILOSEC) 40 MG capsule Take 1 capsule (40 mg total) by mouth daily. 90 capsule 1   risperiDONE (RISPERDAL) 1 MG tablet Take 1 tablet (1 mg total) by mouth at bedtime. 90 tablet 1   sildenafil (VIAGRA) 100 MG tablet TAKE 1/2 (ONE-HALF) TABLET BY MOUTH ONCE DAILY AS NEEDED FOR  ERECTILE  DYSFUNCTION (Patient taking differently: Take 50 mg by mouth as needed for erectile dysfunction.) 30 tablet 2   topiramate (TOPAMAX) 50 MG tablet 1 tablet     traMADol (ULTRAM) 50 MG tablet Take 1 tablet (50 mg total) by mouth every 12 (twelve) hours as needed. 60 tablet 2   traZODone (DESYREL) 100 MG tablet Take 0.5-1 tablets (50-100 mg total) by mouth at bedtime. 60 tablet 1   UBRELVY 100 MG TABS TAKE 1 TABLET(100 MG) BY MOUTH 1 TIME EVERY 2 HOURS AS DIRECTED AS  NEEDED. MAXIMUM 2 TABLETS IN 24 HOURS 16 tablet 5   valACYclovir (VALTREX) 500 MG tablet Take 1 tablet (500 mg total) by mouth 2 (two) times daily. 6 tablet 3   No facility-administered medications prior to visit.    ROS Review of Systems  Constitutional:  Negative for diaphoresis and fatigue.  HENT: Negative.    Eyes: Negative.   Respiratory: Negative.  Negative for cough, choking, shortness of breath and wheezing.   Cardiovascular:  Negative for chest pain, palpitations and leg swelling.  Gastrointestinal:  Negative for abdominal pain, constipation, diarrhea, nausea and vomiting.  Endocrine: Negative.   Genitourinary: Negative.  Negative for difficulty urinating, dysuria and hematuria.  Musculoskeletal:  Negative for arthralgias, back pain, myalgias and neck pain.  Skin: Negative.  Negative for color change and pallor.  Neurological:  Negative for dizziness, weakness, light-headedness and numbness.  Hematological:  Negative for adenopathy. Does not bruise/bleed easily.  Psychiatric/Behavioral: Negative.     Objective:  BP 138/82 (BP Location: Right Arm, Patient Position: Sitting, Cuff Size: Large)   Pulse 65   Temp 98.5 F (36.9 C) (Oral)   Resp 16   Ht '5\' 6"'$  (1.676 m)   Wt 161 lb (73 kg)   SpO2 97%   BMI 25.99 kg/m   BP Readings from Last 3 Encounters:  05/07/21 138/82  05/06/21 104/70  04/17/21 [name])  160/98    Wt Readings from Last 3 Encounters:  05/07/21 161 lb (73 kg)  05/06/21 160 lb 6.4 oz (72.8 kg)  04/17/21 159 lb (72.1 kg)    Physical Exam Vitals reviewed.  HENT:     Nose: Nose normal.     Mouth/Throat:     Mouth: Mucous membranes are moist.  Eyes:     Conjunctiva/sclera: Conjunctivae normal.  Cardiovascular:     Rate and Rhythm: Normal rate and regular rhythm.     Heart sounds: No murmur heard. Pulmonary:     Effort: Pulmonary effort is normal.     Breath sounds: No stridor. No wheezing, rhonchi or rales.  Abdominal:     General: Abdomen is flat.      Palpations: There is no mass.     Tenderness: There is no abdominal tenderness. There is no guarding.     Hernia: No hernia is present.  Musculoskeletal:        General: Normal range of motion.     Cervical back: Neck supple.     Right lower leg: No edema.     Left lower leg: No edema.  Lymphadenopathy:     Cervical: No cervical adenopathy.  Skin:    General: Skin is warm and dry.  Neurological:     General: No focal deficit present.     Mental Status: He is alert.  Psychiatric:        Mood and Affect: Mood normal.        Behavior: Behavior normal.    Lab Results  Component Value Date   WBC 9.3 04/11/2021   HGB 12.4 (L) 04/11/2021   HCT 36.0 (L) 04/11/2021   PLT 231.0 04/11/2021   GLUCOSE 90 04/11/2021   CHOL 100 12/03/2020   TRIG 144 12/03/2020   HDL 27 (L) 12/03/2020   LDLDIRECT 156 (H) 06/09/2011   LDLCALC 48 12/03/2020   ALT 13 04/02/2021   AST 18 04/02/2021   NA 139 04/11/2021   K 3.7 04/11/2021   CL 108 04/11/2021   CREATININE 1.24 04/11/2021   BUN 17 04/11/2021   CO2 24 04/11/2021   TSH 4.033 04/02/2021   PSA 0.33 08/21/2009   INR 1.17 04/19/2015   HGBA1C 5.8 (H) 12/03/2020    DG Chest 1 View  Result Date: 04/02/2021 CLINICAL DATA:  Per roommate pt woke up and found himself on floor today in bathroom by toilet. Roommate reports pt then fell again in hall, no complaints at this time EXAM: CHEST  1 VIEW COMPARISON:  Chest x-ray 02/27/2021, CT chest 09/27/2020. FINDINGS: The heart size and mediastinal contours are unchanged. Aortic calcification Nodular density overlying the peripheral right mid lung zone. Query peripheral hazy airspace opacity within the left mid lung zone versus overlying soft tissues. Slightly coarsened interstitial markings with no overt pulmonary edema. No pleural effusion. No pneumothorax. No acute osseous abnormality. IMPRESSION: 1. Nodular density overlying the peripheral right mid lung zone. Recommend CT chest for further evaluation.  2. Query peripheral hazy airspace opacity within the left mid lung zone versus overlying soft tissues. Electronically Signed   By: Iven Finn M.D.   On: 04/02/2021 16:18   CT HEAD WO CONTRAST  Result Date: 04/02/2021 CLINICAL DATA:  Recurrent syncope. EXAM: CT HEAD WITHOUT CONTRAST TECHNIQUE: Contiguous axial images were obtained from the base of the skull through the vertex without intravenous contrast. COMPARISON:  Head CT 07/19/2015 FINDINGS: Brain: No intracranial hemorrhage, mass effect, or midline shift. Normal for age atrophy.  No hydrocephalus. Incidental cavum septum pellucidum. The basilar cisterns are patent. Minimal periventricular chronic small vessel ischemia. No evidence of territorial infarct or acute ischemia. No extra-axial or intracranial fluid collection. Vascular: Atherosclerosis of skullbase vasculature without hyperdense vessel or abnormal calcification. Skull: No fracture or focal lesion. Sinuses/Orbits: Paranasal sinuses and mastoid air cells are clear. The visualized orbits are unremarkable. Other: None. IMPRESSION: 1. No acute intracranial abnormality. 2. Normal for age atrophy and minimal chronic small vessel ischemia. Electronically Signed   By: Keith Rake M.D.   On: 04/02/2021 16:45    Assessment & Plan:   Bayan was seen today for anemia.  Diagnoses and all orders for this visit:  Vitamin B12 deficiency anemia due to intrinsic factor deficiency- Will continue parenteral B12 replacement therapy.  HYPERTENSION, BENIGN SYSTEMIC- His blood pressure is adequately well controlled.  IMPOTENCE, ORGANIC -     Testosterone Total,Free,Bio, Males; Future -     Testosterone Total,Free,Bio, Males  Erectile dysfunction due to arterial insufficiency- Labs are negative for secondary causes.  I recommended that he see urology to consider additional treatment options. -     Testosterone Total,Free,Bio, Males; Future -     Testosterone Total,Free,Bio, Males -      Ambulatory referral to Urology  I am having [name] maintain his aspirin, sildenafil, topiramate, traZODone, MAGnesium-Oxide, risperiDONE, omeprazole, gabapentin, traMADol, atorvastatin, Ubrelvy, albuterol, and valACYclovir.  No orders of the defined types were placed in this encounter.    Follow-up: Return in about 6 months (around 11/07/2021).  Scarlette Calico, MD

## 2021-05-08 ENCOUNTER — Other Ambulatory Visit (INDEPENDENT_AMBULATORY_CARE_PROVIDER_SITE_OTHER): Payer: Medicare Other

## 2021-05-08 LAB — TESTOSTERONE TOTAL,FREE,BIO, MALES
Albumin: 4.1 g/dL (ref 3.6–5.1)
Sex Hormone Binding: 40 nmol/L (ref 22–77)
Testosterone, Bioavailable: 81.1 ng/dL (ref 15.0–150.0)
Testosterone, Free: 43.1 pg/mL (ref 6.0–73.0)
Testosterone: 381 ng/dL (ref 250–827)

## 2021-05-09 ENCOUNTER — Encounter: Payer: Self-pay | Admitting: Internal Medicine

## 2021-05-10 ENCOUNTER — Telehealth: Payer: Self-pay | Admitting: Internal Medicine

## 2021-05-10 NOTE — Telephone Encounter (Signed)
Pt has been informed and expressed understanding.  

## 2021-05-10 NOTE — Telephone Encounter (Signed)
Called pt, LVM.   

## 2021-05-10 NOTE — Telephone Encounter (Signed)
Patient wants someone to call him to go over test results from last month when he was seen at the ED  Req callback [phone]

## 2021-05-10 NOTE — Telephone Encounter (Signed)
Patient missed call from provider nurse & was calling back   Advised patient nurse will call back when available

## 2021-05-13 ENCOUNTER — Inpatient Hospital Stay (HOSPITAL_COMMUNITY): Admission: RE | Admit: 2021-05-13 | Payer: Medicare Other | Source: Ambulatory Visit

## 2021-05-14 ENCOUNTER — Ambulatory Visit
Admission: RE | Admit: 2021-05-14 | Discharge: 2021-05-14 | Disposition: A | Discharge status: Home / Self Care | Payer: Medicare Other | Source: Ambulatory Visit | Attending: Gastroenterology | Admitting: Gastroenterology

## 2021-05-14 DIAGNOSIS — K7460 Unspecified cirrhosis of liver: Secondary | ICD-10-CM | POA: Diagnosis not present

## 2021-05-14 DIAGNOSIS — K7030 Alcoholic cirrhosis of liver without ascites: Secondary | ICD-10-CM

## 2021-05-14 DIAGNOSIS — N281 Cyst of kidney, acquired: Secondary | ICD-10-CM | POA: Diagnosis not present

## 2021-05-17 ENCOUNTER — Ambulatory Visit (HOSPITAL_COMMUNITY)
Admission: RE | Admit: 2021-05-17 | Discharge: 2021-05-17 | Disposition: A | Discharge status: Home / Self Care | Payer: Medicare Other | Source: Ambulatory Visit | Attending: Family Medicine | Admitting: Family Medicine

## 2021-05-17 ENCOUNTER — Other Ambulatory Visit: Payer: Self-pay

## 2021-05-17 DIAGNOSIS — R202 Paresthesia of skin: Secondary | ICD-10-CM | POA: Diagnosis not present

## 2021-05-17 DIAGNOSIS — G5601 Carpal tunnel syndrome, right upper limb: Secondary | ICD-10-CM | POA: Insufficient documentation

## 2021-05-17 DIAGNOSIS — I739 Peripheral vascular disease, unspecified: Secondary | ICD-10-CM | POA: Diagnosis not present

## 2021-05-17 DIAGNOSIS — R209 Unspecified disturbances of skin sensation: Secondary | ICD-10-CM | POA: Diagnosis not present

## 2021-05-21 NOTE — Progress Notes (Signed)
No evidence of impaired arterial blood flow in the right arm.

## 2021-05-29 ENCOUNTER — Other Ambulatory Visit: Payer: Self-pay

## 2021-05-29 ENCOUNTER — Ambulatory Visit (INDEPENDENT_AMBULATORY_CARE_PROVIDER_SITE_OTHER): Payer: Medicare Other

## 2021-05-29 DIAGNOSIS — D510 Vitamin B12 deficiency anemia due to intrinsic factor deficiency: Secondary | ICD-10-CM | POA: Diagnosis not present

## 2021-05-29 MED ORDER — CYANOCOBALAMIN 1000 MCG/ML IJ SOLN
1000.0000 ug | Freq: Once | INTRAMUSCULAR | Status: AC
  Administered 2021-05-29: 1000 ug via INTRAMUSCULAR

## 2021-05-29 NOTE — Progress Notes (Signed)
B12 injection given w/o any complications.

## 2021-05-30 ENCOUNTER — Telehealth: Payer: Self-pay | Admitting: Internal Medicine

## 2021-05-30 DIAGNOSIS — B009 Herpesviral infection, unspecified: Secondary | ICD-10-CM

## 2021-05-30 MED ORDER — VALACYCLOVIR HCL 500 MG PO TABS: 500.0000 mg | ORAL_TABLET | Freq: Two times a day (BID) | ORAL | 3 refills | Status: DC

## 2021-05-30 NOTE — Telephone Encounter (Signed)
Rx resent.

## 2021-05-30 NOTE — Telephone Encounter (Signed)
Pharmacy did not receive the order for the [place] on 04/22/2021 so pt has not been able to receive any refills.  valACYclovir (VALTREX) 500 MG tablet  [id number] - [place], [address] AT [place]  Please advise.

## 2021-06-07 NOTE — Progress Notes (Signed)
I, [name], LAT, ATC, am serving as scribe for [name].  Jaime Barnes is a 70 y.o. male who presents to [facility] today for f/u of R hand paresthesias, particularly in his R 5th finger, due to ulnar and median neuropathy and CTS.  He was last seen by Dr. [name] on 05/06/21 and was advised to proceed w/ his arterial duplex US that he had on 05/17/21 and was negative for obstructed bloodflow in the R UE.  He was advised to use wrist braces at night and was prescribed Gabapentin.  Today, pt reports R hand paresthesia has worsened, radiating proximally into upper arm.   Diagnostic testing: R UE arterial duplex US- 05/17/21; R UE NCV/EMG- 04/24/21  Pertinent review of systems: No fevers or chills  Relevant historical information: PAD   Exam:  BP 130/88   Pulse 77   Ht '5\' 6"'$  (1.676 m)   Wt 161 lb 6.4 oz (73.2 kg)   SpO2 97%   BMI 26.05 kg/m  General: Well Developed, well nourished, and in no acute distress.   MSK: Right arm normal-appearing Positive Tinel's carpal tunnel and cubital tunnel.  Intact strength. Pulses cap refill and sensation are intact distally.    Lab and Radiology Results  EMG: 04/24/21 Patient Complaints: This is a 70 year old man referred for evaluation of right hand numbness and tingling.   NCV & EMG Findings: Extensive electrodiagnostic testing of the right upper extremity shows:  Right median and ulnar sensory responses show prolonged latency (R4.0, R3.7 ms).  Right radial sensory responses within normal limits.   Right median motor response shows prolonged latency (4.5 ms).  Right ulnar motor response shows slowed conduction velocity across the elbow at the first dorsal interosseous and abductor digiti minimi muscles (A Elbow-B Elbow, 37-43 m/s).   There is no evidence of active or chronic motor axonal loss changes affecting any of the tested muscles.  Motor unit configuration and recruitment pattern is within normal  limits.     Impression: Right ulnar neuropathy with slowing across the elbow, purely demyelinating, moderate.   Right median neuropathy at or distal to the wrist (moderate), consistent with a clinical diagnosis of carpal tunnel syndrome.       ___________________________ [name], DO      Vascular ultrasound upper extremity arterial duplex right: May 17, 2021  Summary:     Right: No obstruction visualized in the right upper extremity.  *See table(s) above for measurements and observations.     Electronically signed by Servando Snare MD on 05/17/2021 at 1:39:31 PM.   Assessment and Plan: 70 y.o. male with right arm paresthesias and pain thought to be due to simultaneous neuropathy of both the ulnar nerve and median nerve at the carpal tunnel and cubital tunnel. He is failing conservative management strategies including restrictive bracing at night for both the elbow and wrist.  Additionally he is already had a trial of gabapentin has not been very helpful. Given his history of PAD vasculature ultrasound of the upper extremity arterial system was performed and was normal ruling out PAD as source of his symptoms.  At this point he is ready to proceed to next steps.  I discussed the option of injections.  He is needle phobic and very resistant to the idea of an office steroid injections.  The next logical step would be referral to hand surgery to discuss surgical options.  I think he could be a good candidate for carpal  tunnel release and ulnar nerve transposition probably done and potentially the same time with orthopedic hand surgery.  Referral placed today.  Recheck back with me as needed.   PDMP not reviewed this encounter. Orders Placed This Encounter  Procedures   Ambulatory referral to Orthopedic Surgery    Referral Priority:   Routine    Referral Type:   Surgical    Referral Reason:   Specialty Services Required    Requested Specialty:   Orthopedic Surgery    Number  of Visits Requested:   1   No orders of the defined types were placed in this encounter.    Discussed warning signs or symptoms. Please see discharge instructions. Patient expresses understanding.   The above documentation has been reviewed and is accurate and complete [name], M.D.

## 2021-06-10 ENCOUNTER — Other Ambulatory Visit: Payer: Self-pay

## 2021-06-10 ENCOUNTER — Ambulatory Visit: Payer: Self-pay

## 2021-06-10 ENCOUNTER — Ambulatory Visit (INDEPENDENT_AMBULATORY_CARE_PROVIDER_SITE_OTHER): Payer: Medicare Other | Admitting: Family Medicine

## 2021-06-10 VITALS — BP 130/88 | HR 77 | Ht 66.0 in | Wt 161.4 lb

## 2021-06-10 DIAGNOSIS — R202 Paresthesia of skin: Secondary | ICD-10-CM

## 2021-06-10 DIAGNOSIS — G5601 Carpal tunnel syndrome, right upper limb: Secondary | ICD-10-CM

## 2021-06-10 DIAGNOSIS — G5621 Lesion of ulnar nerve, right upper limb: Secondary | ICD-10-CM

## 2021-06-10 NOTE — Patient Instructions (Signed)
Thank you for coming in today.   You should hear from hand surgeon soon about your options.   I can do injection at any time.   Injection or surgery would be the next step.

## 2021-06-12 ENCOUNTER — Telehealth: Payer: Self-pay | Admitting: Internal Medicine

## 2021-06-12 ENCOUNTER — Other Ambulatory Visit: Payer: Self-pay | Admitting: Neurology

## 2021-06-12 DIAGNOSIS — M792 Neuralgia and neuritis, unspecified: Secondary | ICD-10-CM

## 2021-06-12 DIAGNOSIS — M19011 Primary osteoarthritis, right shoulder: Secondary | ICD-10-CM

## 2021-06-13 NOTE — Telephone Encounter (Signed)
Patient calling to check the status of the refill request sent for traMADol (ULTRAM) 50 MG tablet  Please follow up w/ pharmacy to make sure rx has been sent

## 2021-06-13 NOTE — Telephone Encounter (Signed)
Pt has been informed that Rx was sent to pharmacy yesterday.

## 2021-06-23 ENCOUNTER — Other Ambulatory Visit: Payer: Self-pay | Admitting: Internal Medicine

## 2021-06-23 DIAGNOSIS — B009 Herpesviral infection, unspecified: Secondary | ICD-10-CM

## 2021-06-24 ENCOUNTER — Telehealth: Payer: Self-pay | Admitting: *Deleted

## 2021-06-24 NOTE — Telephone Encounter (Signed)
Patient is calling to request an afternoon appointment on 37/94/44, conflict in his scheduling on that date and time. Please call.

## 2021-06-25 DIAGNOSIS — H21233 Degeneration of iris (pigmentary), bilateral: Secondary | ICD-10-CM | POA: Diagnosis not present

## 2021-06-25 DIAGNOSIS — H25812 Combined forms of age-related cataract, left eye: Secondary | ICD-10-CM | POA: Diagnosis not present

## 2021-06-26 ENCOUNTER — Ambulatory Visit: Payer: Medicare Other | Admitting: Podiatry

## 2021-06-28 ENCOUNTER — Other Ambulatory Visit: Payer: Self-pay

## 2021-06-28 ENCOUNTER — Ambulatory Visit (INDEPENDENT_AMBULATORY_CARE_PROVIDER_SITE_OTHER): Payer: Medicare Other

## 2021-06-28 DIAGNOSIS — D510 Vitamin B12 deficiency anemia due to intrinsic factor deficiency: Secondary | ICD-10-CM | POA: Diagnosis not present

## 2021-06-28 MED ORDER — CYANOCOBALAMIN 1000 MCG/ML IJ SOLN
1000.0000 ug | Freq: Once | INTRAMUSCULAR | Status: AC
  Administered 2021-06-28: 1000 ug via INTRAMUSCULAR

## 2021-06-28 NOTE — Progress Notes (Addendum)
B12 given w/o any complications.  Medical screening examination/treatment/procedure(s) were performed by non-physician practitioner and as supervising physician I was immediately available for consultation/collaboration.  I agree with above. Cathlean Cower, MD

## 2021-07-01 ENCOUNTER — Other Ambulatory Visit: Payer: Self-pay

## 2021-07-01 ENCOUNTER — Encounter: Payer: Self-pay | Admitting: Podiatry

## 2021-07-01 ENCOUNTER — Ambulatory Visit (INDEPENDENT_AMBULATORY_CARE_PROVIDER_SITE_OTHER): Payer: Medicare Other | Admitting: Podiatry

## 2021-07-01 DIAGNOSIS — B351 Tinea unguium: Secondary | ICD-10-CM | POA: Diagnosis not present

## 2021-07-01 DIAGNOSIS — M79675 Pain in left toe(s): Secondary | ICD-10-CM | POA: Diagnosis not present

## 2021-07-01 DIAGNOSIS — I739 Peripheral vascular disease, unspecified: Secondary | ICD-10-CM

## 2021-07-01 DIAGNOSIS — M79674 Pain in right toe(s): Secondary | ICD-10-CM

## 2021-07-01 DIAGNOSIS — N183 Chronic kidney disease, stage 3 unspecified: Secondary | ICD-10-CM

## 2021-07-01 NOTE — Progress Notes (Signed)
This patient returns to my office for at risk foot care.  This patient requires this care by a professional since this patient will be at risk due to having chronic kidney disease  This patient is unable to cut nails himself since the patient cannot reach his nails.These nails are painful walking and wearing shoes.  This patient presents for at risk foot care today.  General Appearance  Alert, conversant and in no acute stress.  Vascular  Dorsalis pedis and posterior tibial  pulses are weakly  palpable  bilaterally.  Capillary return is within normal limits  bilaterally. Temperature is within normal limits  bilaterally.  Neurologic  Senn-Weinstein monofilament wire test within normal limits  bilaterally. Muscle power within normal limits bilaterally.  Nails Thick disfigured discolored nails with subungual debris  from hallux to fifth toes bilaterally. No evidence of bacterial infection or drainage bilaterally.  Orthopedic  No limitations of motion  feet .  No crepitus or effusions noted.  No bony pathology or digital deformities noted.  Skin  normotropic skin with no porokeratosis noted bilaterally.  No signs of infections or ulcers noted.     Onychomycosis  Pain in right toes  Pain in left toes  Consent was obtained for treatment procedures.   Mechanical debridement of nails 1-5  bilaterally performed with a nail nipper.  Filed with dremel without incident.    Return office visit   10 weeks                  Told patient to return for periodic foot care and evaluation due to potential at risk complications.   Aitanna Haubner DPM  

## 2021-07-02 ENCOUNTER — Encounter: Payer: Self-pay | Admitting: Family Medicine

## 2021-07-02 ENCOUNTER — Ambulatory Visit (INDEPENDENT_AMBULATORY_CARE_PROVIDER_SITE_OTHER): Payer: Medicare Other | Admitting: Family Medicine

## 2021-07-02 ENCOUNTER — Ambulatory Visit: Payer: Self-pay

## 2021-07-02 VITALS — BP 122/80 | HR 65 | Ht 66.0 in | Wt 163.0 lb

## 2021-07-02 DIAGNOSIS — G5601 Carpal tunnel syndrome, right upper limb: Secondary | ICD-10-CM | POA: Diagnosis not present

## 2021-07-02 DIAGNOSIS — R202 Paresthesia of skin: Secondary | ICD-10-CM | POA: Diagnosis not present

## 2021-07-02 DIAGNOSIS — G5621 Lesion of ulnar nerve, right upper limb: Secondary | ICD-10-CM | POA: Diagnosis not present

## 2021-07-02 MED ORDER — LORAZEPAM 0.5 MG PO TABS: ORAL_TABLET | ORAL | 0 refills | Status: DC

## 2021-07-02 NOTE — Progress Notes (Signed)
I, Jaime Barnes, LAT, ATC, am serving as scribe for [name].  Jaime Barnes is a 70 y.o. male who presents to [facility] at [facility] today for  f/u of R hand paresthesias, particularly in his R 5th finger, due to ulnar and median neuropathy and CTS.  He was last seen by Dr. [name] on 06/10/21 and a referral was placed to orthopedic hand surgery. Today, pt reports no change in his symptoms and states that he would like to proceed w/ an injection vs going to see hand surgery.  He notes radiating pain from his R hand/wrist up to this distal upper arm.  His most annoying symptom as numbness and tingling to his fifth digit right hand.  Dx testing: 05/17/21 R UE arterial duplex US   04/24/21 R UE NCV/EMG  Pertinent review of systems: No fevers or chills  Relevant historical information: Stage III kidney disease.  Bipolar disorder.  PAD.   Exam:  BP 122/80 (BP Location: Right Arm, Patient Position: Sitting, Cuff Size: Normal)   Pulse 65   Ht 5\' 6"  (1.676 m)   Wt 163 lb (73.9 kg)   SpO2 96%   BMI 26.31 kg/m  General: Well Developed, well nourished, and in no acute distress.   MSK: Right arm normal-appearing Positive Tinel's cubital tunnel and carpal tunnel.    Lab and Radiology Results Procedure: Real-time Ultrasound Guided Injection of hydrodissection of ulnar nerve at cubital tunnel Device: Philips Affiniti 50G Images permanently stored and available for review in PACS Verbal informed consent obtained.  Discussed risks and benefits of procedure. Warned about infection bleeding damage to structures skin hypopigmentation and fat atrophy among others. Patient expresses understanding and agreement Time-out conducted.   Noted no overlying erythema, induration, or other signs of local infection.   Skin prepped in a sterile fashion.   Local anesthesia: Topical Ethyl chloride.   With sterile technique and under real time ultrasound guidance: 40 mg of Kenalog and 1  mL of lidocaine injected into little tunnel around ulnar nerve. Fluid seen entering the cubital tunnel.   Completed without difficulty   Pain immediately resolved suggesting accurate placement of the medication.   Advised to call if fevers/chills, erythema, induration, drainage, or persistent bleeding.   Images permanently stored and available for review in the ultrasound unit.  Impression: Technically successful ultrasound guided injection.        EMG 04/24/21 Patient Complaints: This is a 70 year old man referred for evaluation of right hand numbness and tingling.   NCV & EMG Findings: Extensive electrodiagnostic testing of the right upper extremity shows:  Right median and ulnar sensory responses show prolonged latency (R4.0, R3.7 ms).  Right radial sensory responses within normal limits.   Right median motor response shows prolonged latency (4.5 ms).  Right ulnar motor response shows slowed conduction velocity across the elbow at the first dorsal interosseous and abductor digiti minimi muscles (A Elbow-B Elbow, 37-43 m/s).   There is no evidence of active or chronic motor axonal loss changes affecting any of the tested muscles.  Motor unit configuration and recruitment pattern is within normal limits.     Impression: Right ulnar neuropathy with slowing across the elbow, purely demyelinating, moderate.   Right median neuropathy at or distal to the wrist (moderate), consistent with a clinical diagnosis of carpal tunnel syndrome.       ___________________________ Jaime Amber, DO   Assessment and Plan: 70 y.o. male with right arm paresthesias due to carpal tunnel  and cubital tunnel syndrome.  Primary issue patient would like to address today is cubital tunnel syndrome.  He presented this morning and after discussion noted significant anxiety and needle phobia.  I prescribed lorazepam and he returned in the afternoon for scheduled time to proceed with injection.  He took 2x 0.5mg   lorazepam about an hour ago and we proceeded with the nerve injection at the cubital tunnel as above with 0 pain or anxiety. Plan to recheck in 1 month and if needed proceed with carpal tunnel injection.   PDMP not reviewed this encounter. Orders Placed This Encounter  Procedures   [place] LIMITED JOINT SPACE STRUCTURES UP RIGHT(NO LINKED CHARGES)    Order Specific Question:   Reason for Exam (SYMPTOM  OR DIAGNOSIS REQUIRED)    Answer:   R hand pain    Order Specific Question:   Preferred imaging location?    Answer:   [place]   Meds ordered this encounter  Medications   LORazepam (ATIVAN) 0.5 MG tablet    Sig: 1-2 tabs 30 - 60 min prior to injection. Do not drive with this medicine.    Dispense:  4 tablet    Refill:  0     Discussed warning signs or symptoms. Please see discharge instructions. Patient expresses understanding.   The above documentation has been reviewed and is accurate and complete [name], M.D.

## 2021-07-02 NOTE — Patient Instructions (Addendum)
Good to see you today.  You received an injection today. Seek immediate medical attention if the joint becomes red, extremely painful, or is oozing fluid.   Recheck back in 1 month.

## 2021-07-03 ENCOUNTER — Telehealth: Payer: Self-pay | Admitting: Internal Medicine

## 2021-07-03 NOTE — Telephone Encounter (Signed)
Patient states Dr Ronnald Ramp prescribes medication not [facility]  Patient requesting refill for meloxicam (MOBIC) 15 MG tablet   [place], [place]

## 2021-07-05 ENCOUNTER — Other Ambulatory Visit: Payer: Self-pay | Admitting: Internal Medicine

## 2021-07-05 NOTE — Telephone Encounter (Signed)
Called pt, LVM.   

## 2021-07-05 NOTE — Telephone Encounter (Signed)
   Patient made aware of Dr Ronnald Ramp message

## 2021-07-08 ENCOUNTER — Telehealth: Payer: Self-pay | Admitting: Internal Medicine

## 2021-07-08 NOTE — Telephone Encounter (Signed)
  Pt states pharmacy will not refill MAGNESIUM-OXIDE 400 (240 Mg) MG tablet  PT states there is 2 refills left on rx.  PT states pharmacy requesting doctor approval   [place], [place]

## 2021-07-09 NOTE — Telephone Encounter (Signed)
I have spoke to the pharmacist and she stated the Rx was ready for pick up for $2.99.   Pt has been informed and will pick up Rx.

## 2021-07-11 ENCOUNTER — Ambulatory Visit (INDEPENDENT_AMBULATORY_CARE_PROVIDER_SITE_OTHER): Payer: Medicare Other | Admitting: Internal Medicine

## 2021-07-11 ENCOUNTER — Encounter: Payer: Self-pay | Admitting: Internal Medicine

## 2021-07-11 ENCOUNTER — Other Ambulatory Visit: Payer: Self-pay

## 2021-07-11 ENCOUNTER — Ambulatory Visit (INDEPENDENT_AMBULATORY_CARE_PROVIDER_SITE_OTHER): Payer: Medicare Other

## 2021-07-11 VITALS — BP 124/72 | HR 71 | Temp 97.7°F | Resp 16 | Ht 66.0 in | Wt 165.0 lb

## 2021-07-11 DIAGNOSIS — D510 Vitamin B12 deficiency anemia due to intrinsic factor deficiency: Secondary | ICD-10-CM | POA: Diagnosis not present

## 2021-07-11 DIAGNOSIS — M792 Neuralgia and neuritis, unspecified: Secondary | ICD-10-CM

## 2021-07-11 DIAGNOSIS — M25551 Pain in right hip: Secondary | ICD-10-CM | POA: Diagnosis not present

## 2021-07-11 DIAGNOSIS — Z23 Encounter for immunization: Secondary | ICD-10-CM | POA: Diagnosis not present

## 2021-07-11 DIAGNOSIS — D72829 Elevated white blood cell count, unspecified: Secondary | ICD-10-CM | POA: Diagnosis not present

## 2021-07-11 DIAGNOSIS — M19011 Primary osteoarthritis, right shoulder: Secondary | ICD-10-CM

## 2021-07-11 LAB — CBC WITH DIFFERENTIAL/PLATELET
Basophils Absolute: 0.1 10*3/uL (ref 0.0–0.1)
Basophils Relative: 0.6 % (ref 0.0–3.0)
Eosinophils Absolute: 0.1 10*3/uL (ref 0.0–0.7)
Eosinophils Relative: 0.6 % (ref 0.0–5.0)
HCT: 39.6 % (ref 39.0–52.0)
Hemoglobin: 13.1 g/dL (ref 13.0–17.0)
Lymphocytes Relative: 19.5 % (ref 12.0–46.0)
Lymphs Abs: 2.5 10*3/uL (ref 0.7–4.0)
MCHC: 33.2 g/dL (ref 30.0–36.0)
MCV: 94.9 fl (ref 78.0–100.0)
Monocytes Absolute: 1.2 10*3/uL — ABNORMAL HIGH (ref 0.1–1.0)
Monocytes Relative: 9.4 % (ref 3.0–12.0)
Neutro Abs: 8.9 10*3/uL — ABNORMAL HIGH (ref 1.4–7.7)
Neutrophils Relative %: 69.9 % (ref 43.0–77.0)
Platelets: 215 10*3/uL (ref 150.0–400.0)
RBC: 4.17 Mil/uL — ABNORMAL LOW (ref 4.22–5.81)
RDW: 14.7 % (ref 11.5–15.5)
WBC: 12.7 10*3/uL — ABNORMAL HIGH (ref 4.0–10.5)

## 2021-07-11 LAB — C-REACTIVE PROTEIN: CRP: 1.9 mg/dL (ref 0.5–20.0)

## 2021-07-11 NOTE — Progress Notes (Signed)
Subjective:  Patient ID: Jaime Barnes, male    DOB: [birth year]  Age: 70 y.o. MRN: [id number]  CC: Anemia  This visit occurred during the SARS-CoV-2 public health emergency.  Safety protocols were in place, including screening questions prior to the visit, additional usage of staff PPE, and extensive cleaning of exam room while observing appropriate contact time as indicated for disinfecting solutions.    HPI Jaime Barnes presents for f/up -  He woke up today with pain in his right hip.  He denies trauma or injury.  He denies nausea, vomiting, fever, or chills.    Outpatient Medications Prior to Visit  Medication Sig Dispense Refill   albuterol (VENTOLIN HFA) 108 (90 Base) MCG/ACT inhaler Inhale 1-2 puffs into the lungs every 6 (six) hours as needed for wheezing or shortness of breath. 18 g 3   aspirin 81 MG chewable tablet Chew 81 mg by mouth daily.     atorvastatin (LIPITOR) 80 MG tablet Take 80 mg by mouth daily.     gabapentin (NEURONTIN) 300 MG capsule Take 1 capsule (300 mg total) by mouth 2 (two) times daily. 180 capsule 1   lisinopril-hydrochlorothiazide (ZESTORETIC) 20-12.5 MG tablet Take 1 tablet by mouth daily.     LORazepam (ATIVAN) 0.5 MG tablet 1-2 tabs 30 - 60 min prior to injection. Do not drive with this medicine. 4 tablet 0   MAGNESIUM-OXIDE 400 (240 Mg) MG tablet Take 1 tablet by mouth daily.     meloxicam (MOBIC) 15 MG tablet Take 15 mg by mouth daily.     omeprazole (PRILOSEC) 20 MG capsule Take 20 mg by mouth 2 (two) times daily.     risperiDONE (RISPERDAL) 1 MG tablet Take 1 tablet (1 mg total) by mouth at bedtime. 90 tablet 1   tadalafil (CIALIS) 5 MG tablet SMARTSIG:1-4 Tablet(s) By Mouth Daily PRN     topiramate (TOPAMAX) 100 MG tablet TAKE 1 TABLET(100 MG) BY MOUTH TWICE DAILY 60 tablet 1   topiramate (TOPAMAX) 50 MG tablet 1 tablet     traMADol (ULTRAM) 50 MG tablet TAKE 1 TABLET(50 MG) BY MOUTH EVERY 12 HOURS AS NEEDED 60 tablet 3   traZODone (DESYREL) 100 MG  tablet Take 0.5-1 tablets (50-100 mg total) by mouth at bedtime. 60 tablet 1   UBRELVY 100 MG TABS TAKE 1 TABLET(100 MG) BY MOUTH 1 TIME EVERY 2 HOURS AS DIRECTED AS NEEDED. MAXIMUM 2 TABLETS IN 24 HOURS 16 tablet 5   valACYclovir (VALTREX) 500 MG tablet TAKE 1 TABLET(500 MG) BY MOUTH TWICE DAILY 6 tablet 3   verapamil (CALAN) 120 MG tablet Take by mouth daily as needed.     No facility-administered medications prior to visit.    ROS Review of Systems  Constitutional:  Negative for chills, diaphoresis, fatigue and fever.  HENT: Negative.    Eyes: Negative.   Respiratory:  Negative for cough, chest tightness, wheezing and stridor.   Cardiovascular:  Negative for chest pain, palpitations and leg swelling.  Gastrointestinal:  Negative for abdominal pain, constipation, diarrhea, nausea and vomiting.  Endocrine: Negative.   Genitourinary: Negative.  Negative for difficulty urinating.  Musculoskeletal:  Positive for arthralgias. Negative for back pain and myalgias.  Skin: Negative.  Negative for color change and pallor.  Neurological:  Negative for dizziness, weakness and light-headedness.  Hematological:  Negative for adenopathy. Does not bruise/bleed easily.  Psychiatric/Behavioral: Negative.     Objective:  BP 124/72 (BP Location: Left Arm, Patient Position: Sitting, Cuff Size: Large)  Pulse 71   Temp 97.7 F (36.5 C) (Oral)   Resp 16   Ht 5\' 6"  (1.676 m)   Wt 165 lb (74.8 kg)   SpO2 98%   BMI 26.63 kg/m   BP Readings from Last 3 Encounters:  07/11/21 124/72  07/02/21 122/80  06/10/21 130/88    Wt Readings from Last 3 Encounters:  07/11/21 165 lb (74.8 kg)  07/02/21 163 lb (73.9 kg)  06/10/21 161 lb 6.4 oz (73.2 kg)    Physical Exam Vitals reviewed.  HENT:     Nose: Nose normal.     Mouth/Throat:     Mouth: Mucous membranes are moist.  Eyes:     General: No scleral icterus.    Conjunctiva/sclera: Conjunctivae normal.  Cardiovascular:     Rate and Rhythm: Normal  rate and regular rhythm.     Heart sounds: No murmur heard. Pulmonary:     Effort: Pulmonary effort is normal.     Breath sounds: No stridor. No wheezing, rhonchi or rales.  Abdominal:     General: Abdomen is flat.     Palpations: There is no mass.     Tenderness: There is no abdominal tenderness. There is no guarding.     Hernia: No hernia is present.  Musculoskeletal:        General: No swelling. Normal range of motion.     Cervical back: Neck supple.     Right hip: Normal. No deformity, tenderness, bony tenderness or crepitus. Normal range of motion. Normal strength.     Left hip: Normal. No deformity, tenderness, bony tenderness or crepitus. Normal strength.     Right lower leg: No edema.     Left lower leg: No edema.  Lymphadenopathy:     Cervical: No cervical adenopathy.     Lower Body: No right inguinal adenopathy. No left inguinal adenopathy.  Skin:    General: Skin is warm and dry.     Findings: No rash.  Neurological:     General: No focal deficit present.     Mental Status: He is alert.  Psychiatric:        Mood and Affect: Mood normal.        Behavior: Behavior normal.    Lab Results  Component Value Date   WBC 12.7 (H) 07/11/2021   HGB 13.1 07/11/2021   HCT 39.6 07/11/2021   PLT 215.0 07/11/2021   GLUCOSE 90 04/11/2021   CHOL 100 12/03/2020   TRIG 144 12/03/2020   HDL 27 (L) 12/03/2020   LDLDIRECT 156 (H) 06/09/2011   LDLCALC 48 12/03/2020   ALT 13 04/02/2021   AST 18 04/02/2021   NA 139 04/11/2021   K 3.7 04/11/2021   CL 108 04/11/2021   CREATININE 1.24 04/11/2021   BUN 17 04/11/2021   CO2 24 04/11/2021   TSH 4.033 04/02/2021   PSA 0.33 08/21/2009   INR 1.17 04/19/2015   HGBA1C 5.8 (H) 12/03/2020    VAS [place] UPPER EXTREMITY ARTERIAL DUPLEX  Result Date: 05/17/2021  UPPER EXTREMITY DUPLEX STUDY Patient Name:  Jaime Barnes  Date of Exam:   05/17/2021 Medical Rec #: [id number]    Accession #:    [id number] Date of Birth: [birth year]   Patient Gender: M  Patient Age:   70 years Exam Location:  [facility] Procedure:      VAS [place] UPPER EXTREMITY ARTERIAL DUPLEX Referring Phys: Ellard Artis COREY --------------------------------------------------------------------------------  Indications: Right hand pain. Numbness of right 4th and 5th digits.  Comparison Study: none Performing Technologist: June Leap RDMS, RVT  Examination Guidelines: A complete evaluation includes B-mode imaging, spectral Doppler, color Doppler, and power Doppler as needed of all accessible portions of each vessel. Bilateral testing is considered an integral part of a complete examination. Limited examinations for reoccurring indications may be performed as noted.  Right Doppler Findings: +---------------+----------+---------+--------+--------+ Site           PSV (cm/s)Waveform StenosisComments +---------------+----------+---------+--------+--------+ Subclavian Dist76        triphasic                 +---------------+----------+---------+--------+--------+ Axillary       71        triphasic                 +---------------+----------+---------+--------+--------+ Brachial Prox  81        triphasic                 +---------------+----------+---------+--------+--------+ Brachial Dist  72        triphasic                 +---------------+----------+---------+--------+--------+ Radial Prox    80        triphasic                 +---------------+----------+---------+--------+--------+ Radial Dist    105       triphasic                 +---------------+----------+---------+--------+--------+ Ulnar Prox     91        triphasic                 +---------------+----------+---------+--------+--------+ Ulnar Dist     33        triphasic                 +---------------+----------+---------+--------+--------+ Palmar Arch    101       triphasic                 +---------------+----------+---------+--------+--------+ Palmar arch: Waveform  obliterates with radial compression and remains unchanged with ulnar compression.   Summary:  Right: No obstruction visualized in the right upper extremity. *See table(s) above for measurements and observations. Electronically signed by Servando Snare MD on 05/17/2021 at 1:39:31 PM.    Final    No results found.   Assessment & Plan:   Adonai was seen today for anemia.  Diagnoses and all orders for this visit:  Vitamin B12 deficiency anemia due to intrinsic factor deficiency- His H&H are normal now.  Will continue parenteral B12 replacement therapy. -     CBC with Differential/Platelet; Future -     CBC with Differential/Platelet  Acute hip pain, right- The examination is unremarkable.  Plain films are normal.  His white cell count is elevated but his CRP is normal.  I recommended that he undergo a stat CT scan of the right hip to see if there is a septic joint.  If so, he will need to be treated with intravenous antibiotics.  I recommended that he control the pain with meloxicam and tramadol. -     C-reactive protein; Future -     Cancel: DG Hip Unilat W OR W/O Pelvis Min 4 Views Right; Future -     DG HIP UNILAT WITH PELVIS 2-3 VIEWS RIGHT; Future -     C-reactive protein -     CT HIP RIGHT W CONTRAST; Future  Flu vaccine need -  Flu Vaccine QUAD High Dose(Fluad)  Leukocytosis, unspecified type -     CT HIP RIGHT W CONTRAST; Future  Neuropathic pain  Primary osteoarthritis of right shoulder  I am having [name] maintain his aspirin, topiramate, traZODone, risperiDONE, gabapentin, atorvastatin, Ubrelvy, albuterol, traMADol, topiramate, valACYclovir, lisinopril-hydrochlorothiazide, MAGnesium-Oxide, meloxicam, omeprazole, tadalafil, verapamil, and LORazepam.  No orders of the defined types were placed in this encounter.    Follow-up: No follow-ups on file.  Scarlette Calico, MD

## 2021-07-12 ENCOUNTER — Telehealth: Payer: Self-pay | Admitting: Internal Medicine

## 2021-07-12 NOTE — Telephone Encounter (Signed)
Pt has been informed to taking Rx's that was already prescribed to him. He expressed understanding.

## 2021-07-12 NOTE — Telephone Encounter (Signed)
Patient seen provider 07/11/21.[name] patient says provider advised him he would send rx for pain to pharmacy but pharmacy has not received anything  Please let patient know when rx has been sent  Pharmacy:  [facility] [place], [place] - [address]  Phone:  [phone] Fax:  [phone]

## 2021-07-13 ENCOUNTER — Encounter: Payer: Self-pay | Admitting: Internal Medicine

## 2021-07-13 NOTE — Patient Instructions (Signed)
Hip Pain The hip is the joint between the upper legs and the lower pelvis. The bones, cartilage, tendons, and muscles of your hip joint support your body and allow you to move around. Hip pain can range from a minor ache to severe pain in one or both of your hips. The pain may be felt on the inside of the hip joint near the groin, or on the outside near the buttocks and upper thigh. You may also have swelling or stiffness in your hip area. Follow these instructions at home: Managing pain, stiffness, and swelling   If directed, put ice on the painful area. To do this: Put ice in a plastic bag. Place a towel between your skin and the bag. Leave the ice on for 20 minutes, 2-3 times a day. If directed, apply heat to the affected area as often as told by your health care provider. Use the heat source that your health care provider recommends, such as a moist heat pack or a heating pad. Place a towel between your skin and the heat source. Leave the heat on for 20-30 minutes. Remove the heat if your skin turns bright red. This is especially important if you are unable to feel pain, heat, or cold. You may have a greater risk of getting burned. Activity Do exercises as told by your health care provider. Avoid activities that cause pain. General instructions  Take over-the-counter and prescription medicines only as told by your health care provider. Keep a journal of your symptoms. Write down: How often you have hip pain. The location of your pain. What the pain feels like. What makes the pain worse. Sleep with a pillow between your legs on your most comfortable side. Keep all follow-up visits as told by your health care provider. This is important. Contact a health care provider if: You cannot put weight on your leg. Your pain or swelling continues or gets worse after one week. It gets harder to walk. You have a fever. Get help right away if: You fall. You have a sudden increase in pain and  swelling in your hip. Your hip is red or swollen or very tender to touch. Summary Hip pain can range from a minor ache to severe pain in one or both of your hips. The pain may be felt on the inside of the hip joint near the groin, or on the outside near the buttocks and upper thigh. Avoid activities that cause pain. Write down how often you have hip pain, the location of the pain, what makes it worse, and what it feels like. This information is not intended to replace advice given to you by your health care provider. Make sure you discuss any questions you have with your health care provider. Document Revised: 01/31/2019 Document Reviewed: 01/31/2019 Elsevier Patient Education  2022 Elsevier Inc.  

## 2021-07-16 ENCOUNTER — Emergency Department (HOSPITAL_BASED_OUTPATIENT_CLINIC_OR_DEPARTMENT_OTHER)
Admission: EM | Admit: 2021-07-16 | Discharge: 2021-07-16 | Disposition: A | Discharge status: Home / Self Care | Payer: Medicare Other | Attending: Emergency Medicine | Admitting: Emergency Medicine

## 2021-07-16 ENCOUNTER — Ambulatory Visit (INDEPENDENT_AMBULATORY_CARE_PROVIDER_SITE_OTHER): Payer: Medicare Other | Admitting: Internal Medicine

## 2021-07-16 ENCOUNTER — Emergency Department (HOSPITAL_BASED_OUTPATIENT_CLINIC_OR_DEPARTMENT_OTHER): Payer: Medicare Other

## 2021-07-16 ENCOUNTER — Encounter: Payer: Self-pay | Admitting: Internal Medicine

## 2021-07-16 ENCOUNTER — Other Ambulatory Visit: Payer: Self-pay

## 2021-07-16 ENCOUNTER — Encounter (HOSPITAL_BASED_OUTPATIENT_CLINIC_OR_DEPARTMENT_OTHER): Payer: Self-pay | Admitting: Emergency Medicine

## 2021-07-16 DIAGNOSIS — I10 Essential (primary) hypertension: Secondary | ICD-10-CM | POA: Diagnosis not present

## 2021-07-16 DIAGNOSIS — K7460 Unspecified cirrhosis of liver: Secondary | ICD-10-CM | POA: Diagnosis not present

## 2021-07-16 DIAGNOSIS — K5900 Constipation, unspecified: Secondary | ICD-10-CM | POA: Diagnosis not present

## 2021-07-16 DIAGNOSIS — I2510 Atherosclerotic heart disease of native coronary artery without angina pectoris: Secondary | ICD-10-CM | POA: Insufficient documentation

## 2021-07-16 DIAGNOSIS — N1831 Chronic kidney disease, stage 3a: Secondary | ICD-10-CM | POA: Insufficient documentation

## 2021-07-16 DIAGNOSIS — I129 Hypertensive chronic kidney disease with stage 1 through stage 4 chronic kidney disease, or unspecified chronic kidney disease: Secondary | ICD-10-CM | POA: Insufficient documentation

## 2021-07-16 DIAGNOSIS — M5459 Other low back pain: Secondary | ICD-10-CM | POA: Diagnosis not present

## 2021-07-16 DIAGNOSIS — R1084 Generalized abdominal pain: Secondary | ICD-10-CM | POA: Insufficient documentation

## 2021-07-16 DIAGNOSIS — M545 Low back pain, unspecified: Secondary | ICD-10-CM | POA: Diagnosis not present

## 2021-07-16 DIAGNOSIS — Z79899 Other long term (current) drug therapy: Secondary | ICD-10-CM | POA: Insufficient documentation

## 2021-07-16 DIAGNOSIS — R1032 Left lower quadrant pain: Secondary | ICD-10-CM | POA: Insufficient documentation

## 2021-07-16 DIAGNOSIS — R109 Unspecified abdominal pain: Secondary | ICD-10-CM | POA: Insufficient documentation

## 2021-07-16 DIAGNOSIS — R1012 Left upper quadrant pain: Secondary | ICD-10-CM | POA: Diagnosis not present

## 2021-07-16 DIAGNOSIS — Z7982 Long term (current) use of aspirin: Secondary | ICD-10-CM | POA: Insufficient documentation

## 2021-07-16 DIAGNOSIS — F17210 Nicotine dependence, cigarettes, uncomplicated: Secondary | ICD-10-CM | POA: Insufficient documentation

## 2021-07-16 DIAGNOSIS — N200 Calculus of kidney: Secondary | ICD-10-CM | POA: Diagnosis not present

## 2021-07-16 LAB — URINALYSIS, ROUTINE W REFLEX MICROSCOPIC
Bilirubin Urine: NEGATIVE
Glucose, UA: NEGATIVE mg/dL
Hgb urine dipstick: NEGATIVE
Ketones, ur: NEGATIVE mg/dL
Leukocytes,Ua: NEGATIVE
Nitrite: NEGATIVE
Protein, ur: 30 mg/dL — AB
Specific Gravity, Urine: 1.016 (ref 1.005–1.030)
pH: 6 (ref 5.0–8.0)

## 2021-07-16 LAB — COMPREHENSIVE METABOLIC PANEL
ALT: 8 U/L (ref 0–44)
AST: 11 U/L — ABNORMAL LOW (ref 15–41)
Albumin: 4.6 g/dL (ref 3.5–5.0)
Alkaline Phosphatase: 85 U/L (ref 38–126)
Anion gap: 13 (ref 5–15)
BUN: 13 mg/dL (ref 8–23)
CO2: 21 mmol/L — ABNORMAL LOW (ref 22–32)
Calcium: 10.3 mg/dL (ref 8.9–10.3)
Chloride: 104 mmol/L (ref 98–111)
Creatinine, Ser: 1 mg/dL (ref 0.61–1.24)
GFR, Estimated: >60 mL/min (ref >60)
Glucose, Bld: 123 mg/dL — ABNORMAL HIGH (ref 70–99)
Potassium: 3.5 mmol/L (ref 3.5–5.1)
Sodium: 138 mmol/L (ref 135–145)
Total Bilirubin: 0.5 mg/dL (ref 0.3–1.2)
Total Protein: 8.4 g/dL — ABNORMAL HIGH (ref 6.5–8.1)

## 2021-07-16 LAB — CBC WITH DIFFERENTIAL/PLATELET
Abs Immature Granulocytes: 0.07 10*3/uL (ref 0.00–0.07)
Basophils Absolute: 0 10*3/uL (ref 0.0–0.1)
Basophils Relative: 0 %
Eosinophils Absolute: 0 10*3/uL (ref 0.0–0.5)
Eosinophils Relative: 0 %
HCT: 41.2 % (ref 39.0–52.0)
Hemoglobin: 14.2 g/dL (ref 13.0–17.0)
Immature Granulocytes: 0 %
Lymphocytes Relative: 9 %
Lymphs Abs: 1.5 10*3/uL (ref 0.7–4.0)
MCH: 31.4 pg (ref 26.0–34.0)
MCHC: 34.5 g/dL (ref 30.0–36.0)
MCV: 91.2 fL (ref 80.0–100.0)
Monocytes Absolute: 1.7 10*3/uL — ABNORMAL HIGH (ref 0.1–1.0)
Monocytes Relative: 9 %
Neutro Abs: 14.5 10*3/uL — ABNORMAL HIGH (ref 1.7–7.7)
Neutrophils Relative %: 82 %
Platelets: 272 10*3/uL (ref 150–400)
RBC: 4.52 MIL/uL (ref 4.22–5.81)
RDW: 13.3 % (ref 11.5–15.5)
WBC: 17.8 10*3/uL — ABNORMAL HIGH (ref 4.0–10.5)
nRBC: 0 % (ref 0.0–0.2)

## 2021-07-16 LAB — LIPASE, BLOOD: Lipase: 18 U/L (ref 11–51)

## 2021-07-16 MED ORDER — FENTANYL CITRATE PF 50 MCG/ML IJ SOSY
50.0000 ug | PREFILLED_SYRINGE | Freq: Once | INTRAMUSCULAR | Status: AC
  Administered 2021-07-16: 50 ug via INTRAVENOUS
  Filled 2021-07-16: qty 1

## 2021-07-16 MED ORDER — ONDANSETRON HCL 4 MG/2ML IJ SOLN
4.0000 mg | Freq: Once | INTRAMUSCULAR | Status: AC
  Administered 2021-07-16: 4 mg via INTRAVENOUS
  Filled 2021-07-16: qty 2

## 2021-07-16 MED ORDER — IOHEXOL 300 MG/ML  SOLN
80.0000 mL | Freq: Once | INTRAMUSCULAR | Status: AC | PRN
  Administered 2021-07-16: 80 mL via INTRAVENOUS

## 2021-07-16 MED ORDER — POLYETHYLENE GLYCOL 3350 17 G PO PACK: 17.0000 g | PACK | Freq: Every day | ORAL | 0 refills | Status: AC

## 2021-07-16 MED ORDER — SODIUM CHLORIDE 0.9 % IV BOLUS
1000.0000 mL | Freq: Once | INTRAVENOUS | Status: AC
  Administered 2021-07-16: 1000 mL via INTRAVENOUS

## 2021-07-16 NOTE — Discharge Instructions (Addendum)
Recommend using tramadol as needed for your back pain.  Recommend buying over-the-counter lidocaine patches as well.  Recommend taking MiraLAX as a stool softener as well given your constipation.  Recommend 1 to 2 packets of MiraLAX in 20 ounces of water and titrate as needed daily.

## 2021-07-16 NOTE — ED Notes (Signed)
Patient transported to CT 

## 2021-07-16 NOTE — Progress Notes (Signed)
Patient ID: Jaime Barnes, male   DOB: 1950-12-18, 70 y.o.   MRN: [id number]       Chief Complaint: follow up left sided abd pain       HPI:  Jaime Barnes is a 70 y.o. male here with c/o 2-3 days onset gradually worsening left sided mostly left upper quadrant pain, dull and crampy, no raidation, associated with nausea, but no vomiting, fever, blood, bowel change, dysphagia.  Has no prior hx of same, pain about 9/10.  Pt denies chest pain, increased sob or doe, wheezing, orthopnea, PND, increased LE swelling, palpitations, dizziness or syncope.   Pt denies polydipsia, polyuria, or new focal neuro s/s.        Wt Readings from Last 3 Encounters:  07/16/21 158 lb (71.7 kg)  07/16/21 158 lb (71.7 kg)  07/11/21 165 lb (74.8 kg)   BP Readings from Last 3 Encounters:  07/16/21 (!) 181/99  07/16/21 (!) 178/80  07/11/21 124/72         Past Medical History:  Diagnosis Date   Barrett's esophagus 2011   Recommend repeat EGD for surveillance in 2 yrs-[facility]   Bipolar disorder ([place])    Chronic kidney disease    Cirrhosis ([place])    Diverticulosis of colon    GANGLION CYST, TENDON SHEATH 06/26/2010   Hyperlipidemia    Hypertension    Hypertriglyceridemia 01/21/2019   Impotence    Migraine    Pancreatitis    Pulmonary nodule    Pulmonary nodule seen on imaging study 02/06/2011   Followup CT in December 2014 with 20 months of stability at 4 mm.  No further imaging recommended    Past Surgical History:  Procedure Laterality Date   CARDIAC CATHETERIZATION  2005   Nonobstructive, <40%   TONSILLECTOMY Bilateral 1961    reports that he has been smoking cigars and cigarettes. He started smoking about 58 years ago. He has a 13.25 pack-year smoking history. He quit smokeless tobacco use about 4 years ago. He reports current drug use. Drug: Marijuana. He reports that he does not drink alcohol. family history includes Anemia in his mother; Diabetes in his daughter; Hypertension in his daughter; Migraines in  his daughter. Allergies  Allergen Reactions   Other     Other reaction(s): rash, sweat, nausea Other reaction(s): rash, sweat, nausea   Codeine Phosphate Other (See Comments)    REACTION: breathing difficulty   Penicillins Rash and Other (See Comments)    Break out in sweats/ Rash amoxicillin. Denies airway involvement Has patient had a PCN reaction causing immediate rash, facial/tongue/throat swelling, SOB or lightheadedness with hypotension: Yes Has patient had a PCN reaction causing severe rash involving mucus membranes or skin necrosis: Yes Has patient had a PCN reaction that required hospitalization No Has patient had a PCN reaction occurring within the last 10 years: No If all of the above answers are "NO", then may proceed with Cephalosporin u   Current Outpatient Medications on File Prior to Visit  Medication Sig Dispense Refill   albuterol (VENTOLIN HFA) 108 (90 Base) MCG/ACT inhaler Inhale 1-2 puffs into the lungs every 6 (six) hours as needed for wheezing or shortness of breath. 18 g 3   aspirin 81 MG chewable tablet Chew 81 mg by mouth daily.     atorvastatin (LIPITOR) 80 MG tablet Take 80 mg by mouth daily.     gabapentin (NEURONTIN) 300 MG capsule Take 1 capsule (300 mg total) by mouth 2 (two) times daily. 180 capsule 1  lisinopril-hydrochlorothiazide (ZESTORETIC) 20-12.5 MG tablet Take 1 tablet by mouth daily.     LORazepam (ATIVAN) 0.5 MG tablet 1-2 tabs 30 - 60 min prior to injection. Do not drive with this medicine. 4 tablet 0   MAGNESIUM-OXIDE 400 (240 Mg) MG tablet Take 1 tablet by mouth daily.     meloxicam (MOBIC) 15 MG tablet Take 15 mg by mouth daily.     omeprazole (PRILOSEC) 20 MG capsule Take 20 mg by mouth 2 (two) times daily.     risperiDONE (RISPERDAL) 1 MG tablet Take 1 tablet (1 mg total) by mouth at bedtime. 90 tablet 1   tadalafil (CIALIS) 5 MG tablet SMARTSIG:1-4 Tablet(s) By Mouth Daily PRN     topiramate (TOPAMAX) 100 MG tablet TAKE 1 TABLET(100 MG)  BY MOUTH TWICE DAILY (Patient not taking: No sig reported) 60 tablet 1   topiramate (TOPAMAX) 50 MG tablet 1 tablet     traMADol (ULTRAM) 50 MG tablet TAKE 1 TABLET(50 MG) BY MOUTH EVERY 12 HOURS AS NEEDED 60 tablet 3   traZODone (DESYREL) 100 MG tablet Take 0.5-1 tablets (50-100 mg total) by mouth at bedtime. 60 tablet 1   UBRELVY 100 MG TABS TAKE 1 TABLET(100 MG) BY MOUTH 1 TIME EVERY 2 HOURS AS DIRECTED AS NEEDED. MAXIMUM 2 TABLETS IN 24 HOURS 16 tablet 5   valACYclovir (VALTREX) 500 MG tablet TAKE 1 TABLET(500 MG) BY MOUTH TWICE DAILY 6 tablet 3   verapamil (CALAN) 120 MG tablet Take by mouth daily as needed.     No current facility-administered medications on file prior to visit.        ROS:  All others reviewed and negative.  Objective        PE:  BP (!) 178/80 (BP Location: Right Arm, Patient Position: Sitting, Cuff Size: Large)   Pulse 87   Temp 98 F (36.7 C) (Oral)   Ht 5\' 6"  (1.676 m)   Wt 158 lb (71.7 kg)   SpO2 97%   BMI 25.50 kg/m                 Constitutional: Pt appears in NAD               HENT: Head: NCAT.                Right Ear: External ear normal.                 Left Ear: External ear normal.                Eyes: . Pupils are equal, round, and reactive to light. Conjunctivae and EOM are normal               Nose: without d/c or deformity               Neck: Neck supple. Gross normal ROM               Cardiovascular: Normal rate and regular rhythm.                 Pulmonary/Chest: Effort normal and breath sounds without rales or wheezing.                Abd:  Soft, mod to severe tender luq/left mid abdomen, no guarding or regbound, ND, + BS, no organomegaly               Neurological: Pt is alert. At baseline orientation, motor grossly intact  Skin: Skin is warm. No rashes, no other new lesions, LE edema - none               Psychiatric: Pt behavior is normal without agitation   Micro: none  Cardiac tracings I have personally interpreted  today:  none  Pertinent Radiological findings (summarize): none   Lab Results  Component Value Date   WBC 17.8 (H) 07/16/2021   HGB 14.2 07/16/2021   HCT 41.2 07/16/2021   PLT 272 07/16/2021   GLUCOSE 123 (H) 07/16/2021   CHOL 100 12/03/2020   TRIG 144 12/03/2020   HDL 27 (L) 12/03/2020   LDLDIRECT 156 (H) 06/09/2011   LDLCALC 48 12/03/2020   ALT 8 07/16/2021   AST 11 (L) 07/16/2021   NA 138 07/16/2021   K 3.5 07/16/2021   CL 104 07/16/2021   CREATININE 1.00 07/16/2021   BUN 13 07/16/2021   CO2 21 (L) 07/16/2021   TSH 4.033 04/02/2021   PSA 0.33 08/21/2009   INR 1.17 04/19/2015   HGBA1C 5.8 (H) 12/03/2020   Assessment/Plan:  Jaime Barnes is a 70 y.o. Black or African American [2] male with  has a past medical history of Barrett's esophagus (2011), Bipolar disorder ([place]), Chronic kidney disease, Cirrhosis ([place]), Diverticulosis of colon, GANGLION CYST, TENDON SHEATH (06/26/2010), Hyperlipidemia, Hypertension, Hypertriglyceridemia (01/21/2019), Impotence, Migraine, Pancreatitis, Pulmonary nodule, and Pulmonary nodule seen on imaging study (02/06/2011).  Abdominal pain Pt with 3 days worsening left sided abd pain, +n/v, no BM x 2 days - differential includes constipation vs diverticultiis, pt non toxic but pain 1010, and had mild leukocytosis oct 11  - pt directed to local ED for further evaluation  HYPERTENSION, BENIGN SYSTEMIC BP Readings from Last 3 Encounters:  07/16/21 (!) 181/99  07/16/21 (!) 178/80  07/11/21 124/72   Severe, likely reactive,, pt to continue medical treatment zestoretic, calan   Stage 3a chronic kidney disease ([place]) Lab Results  Component Value Date   CREATININE 1.00 07/16/2021   Stable overall, cont to avoid nephrotoxins  Followup: Return if symptoms worsen or fail to improve.  Cathlean Cower, MD 07/22/2021 9:40 PM [facility]

## 2021-07-16 NOTE — ED Triage Notes (Signed)
Pt arrives to ED with c/o of abdominal pain. The pain started yesterday and has worsened. The pain is located LUQ. He has had no BM in x7 days. He feels constipated. No N/V/D. He denies fevers and chills.

## 2021-07-16 NOTE — Assessment & Plan Note (Addendum)
Pt with 3 days worsening left sided abd pain, +n/v, no BM x 2 days - differential includes constipation vs diverticultiis, pt non toxic but pain 1010, and had mild leukocytosis oct 11  - pt directed to local ED for further evaluation

## 2021-07-16 NOTE — ED Provider Notes (Signed)
[facility] EMERGENCY DEPT Provider Note   CSN: [id number] Arrival date & time: 07/16/21  1003     History Chief Complaint  Patient presents with   Abdominal Pain    Jaime Barnes is a 70 y.o. male.  The history is provided by the patient.  Abdominal Pain Pain location:  LLQ, LUQ and L flank Pain quality: aching   Pain radiates to:  Does not radiate Pain severity:  Mild Onset quality:  Gradual Duration:  5 days Timing:  Constant Progression:  Unchanged Chronicity:  New Relieved by:  Nothing Worsened by:  Nothing Associated symptoms: constipation   Associated symptoms: no anorexia, no belching, no chest pain, no chills, no cough, no dysuria, no fever, no hematuria, no shortness of breath, no sore throat and no vomiting   Risk factors: has not had multiple surgeries       Past Medical History:  Diagnosis Date   Barrett's esophagus 2011   Recommend repeat EGD for surveillance in 2 yrs-[facility]   Bipolar disorder ([place])    Chronic kidney disease    Cirrhosis ([place])    Diverticulosis of colon    GANGLION CYST, TENDON SHEATH 06/26/2010   Hyperlipidemia    Hypertension    Hypertriglyceridemia 01/21/2019   Impotence    Migraine    Pancreatitis    Pulmonary nodule    Pulmonary nodule seen on imaging study 02/06/2011   Followup CT in December 2014 with 20 months of stability at 4 mm.  No further imaging recommended     Patient Active Problem List   Diagnosis Date Noted   Abdominal pain 07/16/2021   Acute hip pain, right 07/11/2021   Erectile dysfunction due to arterial insufficiency 05/07/2021   Vitamin B12 deficiency anemia due to intrinsic factor deficiency 04/14/2021   Stage 3a chronic kidney disease ([place]) 04/14/2021   Subacromial bursitis of right shoulder joint 03/06/2021   Carpal tunnel syndrome, right 03/06/2021   Stage 3b chronic kidney disease ([place]) 02/27/2021   Primary osteoarthritis of right shoulder 02/27/2021   PAD (peripheral artery  disease) ([place]) 09/06/2020   NSVT (nonsustained ventricular tachycardia) 09/06/2020   Hypertriglyceridemia 01/21/2019   Radiculopathy of lumbar region 08/19/2017   Exercise-induced bronchospasm 11/11/2016   Migraine without aura and without status migrainosus, not intractable 12/20/2015   Constipation 05/06/2015   Neuropathic pain 04/19/2015   Tibialis posterior tendinitis 05/03/2013   Plaque psoriasis 08/24/2012   CAD (coronary artery disease) 03/28/2010   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 W/O OBSTRUCTION 11/26/2006   CIRRHOSIS, ALCOHOLIC 89/37/3428   PANCREATITIS, CHRONIC 11/26/2006   IMPOTENCE, ORGANIC 11/26/2006   Insomnia 11/26/2006    Past Surgical History:  Procedure Laterality Date   CARDIAC CATHETERIZATION  2005   Nonobstructive, <40%   TONSILLECTOMY Bilateral 1961       Family History  Problem Relation Age of Onset   Anemia Mother    Hypertension Daughter    Diabetes Daughter    Migraines Daughter     Social History   Tobacco Use   Smoking status: Light Smoker    Packs/day: 0.25    Years: 53.00    Pack years: 13.25    Types: Cigars, Cigarettes    Start date: 09/29/1962  Last attempt to quit: 08/04/2016    Years since quitting: 4.9   Smokeless tobacco: Former    Quit date: 03/08/2017   Tobacco comments:    cigar occassionally   Vaping Use   Vaping Use: Never used  Substance Use Topics   Alcohol use: No    Comment: Last drink "30 years ago"   Drug use: Yes    Types: Marijuana    Comment: daily marijuana since teenager.     Home Medications Prior to Admission medications   Medication Sig Start Date End Date Taking? Authorizing Provider  albuterol (VENTOLIN  HFA) 108 (90 Base) MCG/ACT inhaler Inhale 1-2 puffs into the lungs every 6 (six) hours as needed for wheezing or shortness of breath. 04/15/21  Yes Janith Lima, MD  aspirin 81 MG chewable tablet Chew 81 mg by mouth daily.   Yes [provider]  atorvastatin (LIPITOR) 80 MG tablet Take 80 mg by mouth daily. 12/28/20  Yes [provider]  gabapentin (NEURONTIN) 300 MG capsule Take 1 capsule (300 mg total) by mouth 2 (two) times daily. 02/27/21  Yes Janith Lima, MD  lisinopril-hydrochlorothiazide (ZESTORETIC) 20-12.5 MG tablet Take 1 tablet by mouth daily. 05/17/21  Yes [provider]  LORazepam (ATIVAN) 0.5 MG tablet 1-2 tabs 30 - 60 min prior to injection. Do not drive with this medicine. 07/02/21  Yes Gregor Hams, MD  MAGNESIUM-OXIDE 400 (240 Mg) MG tablet Take 1 tablet by mouth daily. 06/04/21  Yes [provider]  meloxicam (MOBIC) 15 MG tablet Take 15 mg by mouth daily. 05/25/21  Yes [provider]  omeprazole (PRILOSEC) 20 MG capsule Take 20 mg by mouth 2 (two) times daily. 05/20/21  Yes [provider]  polyethylene glycol (MIRALAX / GLYCOLAX) 17 g packet Take 17 g by mouth daily. 07/16/21  Yes Lira Stephen, DO  risperiDONE (RISPERDAL) 1 MG tablet Take 1 tablet (1 mg total) by mouth at bedtime. 02/27/21  Yes Janith Lima, MD  topiramate (TOPAMAX) 50 MG tablet 1 tablet   Yes [provider]  traMADol (ULTRAM) 50 MG tablet TAKE 1 TABLET(50 MG) BY MOUTH EVERY 12 HOURS AS NEEDED 06/12/21  Yes Janith Lima, MD  traZODone (DESYREL) 100 MG tablet Take 0.5-1 tablets (50-100 mg total) by mouth at bedtime. 12/03/20  Yes Just, Laurita Quint, FNP  verapamil (CALAN) 120 MG tablet Take by mouth daily as needed. 06/23/21  Yes [provider]  tadalafil (CIALIS) 5 MG tablet SMARTSIG:1-4 Tablet(s) By Mouth Daily PRN 06/28/21   [provider]  topiramate (TOPAMAX) 100 MG tablet TAKE 1 TABLET(100 MG) BY MOUTH TWICE DAILY Patient not  taking: No sig reported 06/12/21   Jaffe, Shahid Flori R, DO  UBRELVY 100 MG TABS TAKE 1 TABLET(100 MG) BY MOUTH 1 TIME EVERY 2 HOURS AS DIRECTED AS NEEDED. MAXIMUM 2 TABLETS IN 24 HOURS 04/16/21   Tomi Likens, Syler Norcia R, DO  valACYclovir (VALTREX) 500 MG tablet TAKE 1 TABLET(500 MG) BY MOUTH TWICE DAILY 06/23/21   Janith Lima, MD    Allergies    Other, Codeine phosphate, and Penicillins  Review of Systems   Review of Systems  Constitutional:  Negative for chills and fever.  HENT:  Negative for ear pain and sore throat.   Eyes:  Negative for pain and visual disturbance.  Respiratory:  Negative for cough and shortness of breath.   Cardiovascular:  Negative for chest pain and palpitations.  Gastrointestinal:  Positive for abdominal pain and constipation.  Negative for anorexia and vomiting.  Genitourinary:  Negative for dysuria and hematuria.  Musculoskeletal:  Negative for arthralgias and back pain.  Skin:  Negative for color change and rash.  Neurological:  Negative for seizures and syncope.  All other systems reviewed and are negative.  Physical Exam Updated Vital Signs BP (!) 175/100   Pulse 88   Temp 98.7 F (37.1 C) (Oral)   Resp 20   Ht 5\' 6"  (1.676 m)   Wt 71.7 kg   SpO2 99%   BMI 25.50 kg/m   Physical Exam Vitals and nursing note reviewed.  Constitutional:      General: He is not in acute distress.    Appearance: He is well-developed. He is not ill-appearing.  HENT:     Head: Normocephalic and atraumatic.     Mouth/Throat:     Mouth: Mucous membranes are moist.  Eyes:     Extraocular Movements: Extraocular movements intact.     Conjunctiva/sclera: Conjunctivae normal.     Pupils: Pupils are equal, round, and reactive to light.  Cardiovascular:     Rate and Rhythm: Normal rate and regular rhythm.     Heart sounds: Normal heart sounds. No murmur heard. Pulmonary:     Effort: Pulmonary effort is normal. No respiratory distress.     Breath sounds: Normal breath sounds.   Abdominal:     Palpations: Abdomen is soft.     Tenderness: There is abdominal tenderness in the left upper quadrant and left lower quadrant. There is no guarding.     Hernia: No hernia is present.  Musculoskeletal:     Cervical back: Neck supple.  Skin:    General: Skin is warm and dry.     Capillary Refill: Capillary refill takes less than 2 seconds.  Neurological:     General: No focal deficit present.     Mental Status: He is alert.    ED Results / Procedures / Treatments   Labs (all labs ordered are listed, but only abnormal results are displayed) Labs Reviewed  CBC WITH DIFFERENTIAL/PLATELET - Abnormal; Notable for the following components:      Result Value   WBC 17.8 (*)    Neutro Abs 14.5 (*)    Monocytes Absolute 1.7 (*)    All other components within normal limits  COMPREHENSIVE METABOLIC PANEL - Abnormal; Notable for the following components:   CO2 21 (*)    Glucose, Bld 123 (*)    Total Protein 8.4 (*)    AST 11 (*)    All other components within normal limits  URINALYSIS, ROUTINE W REFLEX MICROSCOPIC - Abnormal; Notable for the following components:   Protein, ur 30 (*)    All other components within normal limits  URINE CULTURE  LIPASE, BLOOD  CBC    EKG None  Radiology CT ABDOMEN PELVIS W CONTRAST  Result Date: 07/16/2021 CLINICAL DATA:  Left lower quadrant pain. Diverticulitis suspected. Symptoms began yesterday with worsening. EXAM: CT ABDOMEN AND PELVIS WITH CONTRAST TECHNIQUE: Multidetector CT imaging of the abdomen and pelvis was performed using the standard protocol following bolus administration of intravenous contrast. CONTRAST:  77mL OMNIPAQUE IOHEXOL 300 MG/ML  SOLN COMPARISON:  02/07/2010. FINDINGS: Lower chest: Normal Hepatobiliary: Nodular surface of the liver consistent with cirrhosis. No focal lesion. No calcified gallstones. Pancreas: Normal Spleen: Normal Adrenals/Urinary Tract: Adrenal glands are normal. Right kidney is normal. Left  kidney is normal except for 2 small cysts and a nonobstructing 2 mm stone in the upper  pole. Bladder is normal. Stomach/Bowel: Minimal diverticulosis of the left colon but no evidence of diverticulitis. No small bowel abnormality is seen. Normal appendix. [place] prominent gastric mucosa, but similar to the study of 2011 and 2004. Vascular/Lymphatic: Aortic atherosclerosis. No aneurysm. IVC is normal. No retroperitoneal adenopathy. Reproductive: Normal Other: No free fluid or air. Musculoskeletal: Ordinary mild lumbar degenerative changes. IMPRESSION: No evidence of diverticulitis.  Very minimal diverticulosis. No other acute bowel finding. There is some chronic mucosal thickening of the stomach, similar to studies of 2011 and 2004. Hypertrophic gastritis is considered, but the chronicity of this finding argues against significance. Chronic cirrhosis of the liver.  No focal liver lesion. Nonobstructing 2 mm stone upper pole the left kidney. Aortic Atherosclerosis (ICD10-I70.0). Electronically Signed   By: Nelson Chimes M.D.   On: 07/16/2021 12:06    Procedures Procedures   Medications Ordered in ED Medications  fentaNYL (SUBLIMAZE) injection 50 mcg (50 mcg Intravenous Given 07/16/21 1118)  ondansetron (ZOFRAN) injection 4 mg (4 mg Intravenous Given 07/16/21 1117)  sodium chloride 0.9 % bolus 1,000 mL (0 mLs Intravenous Stopped 07/16/21 1246)  iohexol (OMNIPAQUE) 300 MG/ML solution 80 mL (80 mLs Intravenous Contrast Given 07/16/21 1129)    ED Course  I have reviewed the triage vital signs and the nursing notes.  Pertinent labs & imaging results that were available during my care of the patient were reviewed by me and considered in my medical decision making (see chart for details).    MDM Rules/Calculators/A&P                           Thimothy Barretta is a 70 year old male with history of pancreatitis, CKD who presents the ED with left flank pain, abdominal pain, back pain, constipation.  Overall  unremarkable vitals.  No fever.  Pain mostly left-sided.  Sent here for evaluation by primary care doctor.  Has been having pain on and off for last several days.  Denies any nausea or vomiting.  Has been constipated.  Denies any numbness or tingling in his legs.  Denies any pain with urination.  Denies history of kidney stone.  He is tender mostly to the left side of his abdomen and left flank.  This could be muscular but could be kidney stone or constipation or diverticulitis or bowel obstruction or UTI.  Will check labs including urinalysis and CT scan abdomen pelvis.  CT scan of the abdomen pelvis unremarkable.  No acute diverticulitis or other acute intra-abdominal process.  Lab work showed no significant anemia.  Mild leukocytosis.  But otherwise lab work unremarkable and at baseline.  No concern for pancreatitis or cholecystitis.  Neurovascular neuromuscularly he is intact.  No symptoms to suggest cauda equina.  He has good pulses in his lower extremities and no concern for dissection or peripheral arterial process.  Feeling better after IV fluids and pain medicine.  He has a prescription for tramadol at home but I recommend using lidocaine patches and MiraLAX.  Recommend follow-up primary care doctor.  Discharged in good condition.  This chart was dictated using voice recognition software.  Despite best efforts to proofread,  errors can occur which can change the documentation meaning.   Final Clinical Impression(s) / ED Diagnoses Final diagnoses:  Generalized abdominal pain  Acute left-sided low back pain without sciatica    Rx / DC Orders ED Discharge Orders          Ordered    polyethylene glycol (  MIRALAX / GLYCOLAX) 17 g packet  Daily        07/16/21 1327             Lennice Sites, DO 07/16/21 1330

## 2021-07-16 NOTE — Patient Instructions (Signed)
Please to go to [facility] now to make sure of constipation vs diverticulitis vs obstruction of some kind

## 2021-07-17 ENCOUNTER — Other Ambulatory Visit: Payer: Self-pay

## 2021-07-17 DIAGNOSIS — J45990 Exercise induced bronchospasm: Secondary | ICD-10-CM

## 2021-07-17 DIAGNOSIS — I10 Essential (primary) hypertension: Secondary | ICD-10-CM

## 2021-07-17 LAB — URINE CULTURE: Culture: NO GROWTH

## 2021-07-17 MED ORDER — AMLODIPINE BESYLATE 10 MG PO TABS
ORAL_TABLET | ORAL | 3 refills | Status: DC
Start: 2021-07-17 — End: 2022-03-11

## 2021-07-22 ENCOUNTER — Encounter: Payer: Self-pay | Admitting: Internal Medicine

## 2021-07-22 NOTE — Assessment & Plan Note (Signed)
Lab Results  Component Value Date   CREATININE 1.00 07/16/2021   Stable overall, cont to avoid nephrotoxins

## 2021-07-22 NOTE — Assessment & Plan Note (Signed)
BP Readings from Last 3 Encounters:  07/16/21 (!) 181/99  07/16/21 (!) 178/80  07/11/21 124/72   Severe, likely reactive,, pt to continue medical treatment zestoretic, calan

## 2021-07-25 ENCOUNTER — Telehealth: Payer: Self-pay | Admitting: Internal Medicine

## 2021-07-25 NOTE — Telephone Encounter (Signed)
Patient requesting refill for valACYclovir (VALTREX) 500 MG tablet  gabapentin (NEURONTIN) 300 MG capsule  Patient was last seen 07-16-2021  Patient also requesting refill for atorvastatin (LIPITOR) 80 MG tablet  Advised patient Dr. Ronnald Ramp is not prescribing provider for atorvastatin  Patient states prescribing provider moved  [place], [place]

## 2021-07-26 ENCOUNTER — Ambulatory Visit (INDEPENDENT_AMBULATORY_CARE_PROVIDER_SITE_OTHER): Payer: Medicare Other

## 2021-07-26 ENCOUNTER — Other Ambulatory Visit: Payer: Self-pay | Admitting: Internal Medicine

## 2021-07-26 ENCOUNTER — Other Ambulatory Visit: Payer: Self-pay

## 2021-07-26 DIAGNOSIS — D510 Vitamin B12 deficiency anemia due to intrinsic factor deficiency: Secondary | ICD-10-CM

## 2021-07-26 DIAGNOSIS — E785 Hyperlipidemia, unspecified: Secondary | ICD-10-CM

## 2021-07-26 DIAGNOSIS — B009 Herpesviral infection, unspecified: Secondary | ICD-10-CM

## 2021-07-26 DIAGNOSIS — M792 Neuralgia and neuritis, unspecified: Secondary | ICD-10-CM

## 2021-07-26 MED ORDER — GABAPENTIN 300 MG PO CAPS: 300.0000 mg | ORAL_CAPSULE | Freq: Two times a day (BID) | ORAL | 1 refills | Status: DC

## 2021-07-26 MED ORDER — ATORVASTATIN CALCIUM 80 MG PO TABS: 80.0000 mg | ORAL_TABLET | Freq: Every day | ORAL | 1 refills | Status: DC

## 2021-07-26 MED ORDER — CYANOCOBALAMIN 1000 MCG/ML IJ SOLN
1000.0000 ug | Freq: Once | INTRAMUSCULAR | Status: AC
  Administered 2021-07-26: 1000 ug via INTRAMUSCULAR

## 2021-07-26 MED ORDER — VALACYCLOVIR HCL 500 MG PO TABS: ORAL_TABLET | ORAL | 3 refills | Status: DC

## 2021-07-26 NOTE — Progress Notes (Signed)
Pt was given B12 w/o any complications. 

## 2021-08-05 ENCOUNTER — Other Ambulatory Visit: Payer: Self-pay

## 2021-08-05 ENCOUNTER — Encounter (HOSPITAL_BASED_OUTPATIENT_CLINIC_OR_DEPARTMENT_OTHER): Payer: Self-pay | Admitting: Cardiovascular Disease

## 2021-08-05 ENCOUNTER — Ambulatory Visit (INDEPENDENT_AMBULATORY_CARE_PROVIDER_SITE_OTHER): Payer: Medicare Other | Admitting: Cardiovascular Disease

## 2021-08-05 DIAGNOSIS — I2510 Atherosclerotic heart disease of native coronary artery without angina pectoris: Secondary | ICD-10-CM | POA: Diagnosis not present

## 2021-08-05 DIAGNOSIS — F17200 Nicotine dependence, unspecified, uncomplicated: Secondary | ICD-10-CM

## 2021-08-05 DIAGNOSIS — I10 Essential (primary) hypertension: Secondary | ICD-10-CM

## 2021-08-05 NOTE — Assessment & Plan Note (Signed)
He has nonobstructive CAD.  He has no angina.  He is going to work on increasing his exercise.  Continue aspirin and atorvastatin.  Beta-blockers are held due to bradycardia.

## 2021-08-05 NOTE — Patient Instructions (Signed)
Medication Instructions:  Your physician recommends that you continue on your current medications as directed. Please refer to the Current Medication list given to you today.   *If you need a refill on your cardiac medications before your next appointment, please call your pharmacy*  Lab Work: NONE  Testing/Procedures: NONE  Follow-Up: At [facility], you and your health needs are our priority.  As part of our continuing mission to provide you with exceptional heart care, we have created designated Provider Care Teams.  These Care Teams include your primary Cardiologist (physician) and Advanced Practice Providers (APPs -  Physician Assistants and Nurse Practitioners) who all work together to provide you with the care you need, when you need it.  We recommend signing up for the patient portal called "MyChart".  Sign up information is provided on this After Visit Summary.  MyChart is used to connect with patients for Virtual Visits (Telemedicine).  Patients are able to view lab/test results, encounter notes, upcoming appointments, etc.  Non-urgent messages can be sent to your provider as well.   To learn more about what you can do with MyChart, go to [email or web address].    Your next appointment:   6 month(s)  The format for your next appointment:   In Person  Provider:   Skeet Latch, MD   Other Instructions MONITOR AND LOG YOUR BLOOD PRESSURE  BRING READINGS AND MACHINE TO FOLLOW UP

## 2021-08-05 NOTE — Assessment & Plan Note (Signed)
Blood pressure slightly above goal.  He notes that he has been well-controlled at home and he thinks it is higher because he is in the doctor's office.  I have asked him to track his blood pressures and bring to follow-up along with his machine.  Continue amlodipine, lisinopril, hydrochlorothiazide

## 2021-08-05 NOTE — Progress Notes (Signed)
Cardiology Office Note   Date:  08/05/2021   ID:  Jaime Barnes, DOB [birth year], MRN [id number]  PCP:  Janith Lima, MD  Cardiologist:   Skeet Latch, MD  Cardiology APP: Almyra Deforest, PA  No chief complaint on file.    History of Present Illness: Jaime Barnes is a 70 y.o. male with hypertension, hyperlipidemia non-obstructive CAD, moderate PAD, and mild carotid stenosis who presents for follow up.  Jaime Barnes was evaluated by his PCP's office on 04/2015 with symptoms concerning for claudication.  He reported one month of leg cramping and pain when walking up an incline. He had no pain when walking down hill or on flat land.  He underwent ABI testing that was negative for occlusive disease.  He was referred to cardiology due to suspicion that this was a false negative.  He was referred for exercise ABIs that were normal but Doppler showed 30-49% SFA disease without focal stenosis.  The L peroneal was occluded.  Jaime Barnes had a [place] 06/2004 that revealed 40% LAD, [name]  LVEF was 50%. Repeat ABIs 06/2019 were unchanged.     HCTZ-lisinopril was switched to amlodipine 2/2 worsening renal function.  He followed up with Almyra Deforest, PA on 04/2017 and was doing well.  He again saw Jaime Barnes 04/2018 for dizziness.  He was referred for carotid Dopplers that revealed mild stenosis bilaterally.  He also had a 30-day event monitor that showed sinus rhythm with occasional PACs and PVCs.  He also had a 5 beat run of NSVT.  [name] was referred for a Lexiscan Myoview 06/2018 that revealed LVEF 47% and no ischemia.  Jaime Barnes was referred for repeat ABIs 06/2018 which were normal.  He had a screening abdominal aorta ultrasound that revealed 50-74% of the right common iliac artery. His abdominal aorta was not dilated.       At his last appointment Jaime Barnes was bradycardic but asymptomatic.  Verapamil was stopped and lisinopril was increased. Lately his BP home has been well-controlled.  It has been mostly  <130/80.  He hasn't been getting much exercise lately but plans to start walking and riding a bike soon.  He has no exertional chest pain or shortness of breath.  He quit smoking cigarettes but has been trying to quit smoking cigars.     Past Medical History:  Diagnosis Date   Barrett's esophagus 2011   Recommend repeat EGD for surveillance in 2 yrs-[facility]   Bipolar disorder ([place])    Chronic kidney disease    Cirrhosis ([place])    Diverticulosis of colon    GANGLION CYST, TENDON SHEATH 06/26/2010   Hyperlipidemia    Hypertension    Hypertriglyceridemia 01/21/2019   Impotence    Migraine    Pancreatitis    Pulmonary nodule    Pulmonary nodule seen on imaging study 02/06/2011   Followup CT in December 2014 with 20 months of stability at 4 mm.  No further imaging recommended     Past Surgical History:  Procedure Laterality Date   CARDIAC CATHETERIZATION  2005   Nonobstructive, <40%   TONSILLECTOMY Bilateral 1961     Current Outpatient Medications  Medication Sig Dispense Refill   albuterol (VENTOLIN HFA) 108 (90 Base) MCG/ACT inhaler Inhale 1-2 puffs into the lungs every 6 (six) hours as needed for wheezing or shortness of breath. 18 g 3   amLODipine (NORVASC) 10 MG tablet TAKE 1 TABLET(10 MG) BY MOUTH DAILY 90 tablet  3   aspirin 81 MG chewable tablet Chew 81 mg by mouth daily.     atorvastatin (LIPITOR) 80 MG tablet Take 1 tablet (80 mg total) by mouth daily. 90 tablet 1   gabapentin (NEURONTIN) 300 MG capsule Take 1 capsule (300 mg total) by mouth 2 (two) times daily. 180 capsule 1   lisinopril-hydrochlorothiazide (ZESTORETIC) 20-12.5 MG tablet Take 1 tablet by mouth daily.     LORazepam (ATIVAN) 0.5 MG tablet 1-2 tabs 30 - 60 min prior to injection. Do not drive with this medicine. 4 tablet 0   MAGNESIUM-OXIDE 400 (240 Mg) MG tablet Take 1 tablet by mouth daily.     meloxicam (MOBIC) 15 MG tablet Take 15 mg by mouth daily.     omeprazole (PRILOSEC) 20 MG capsule Take 20 mg by  mouth 2 (two) times daily.     polyethylene glycol (MIRALAX / GLYCOLAX) 17 g packet Take 17 g by mouth daily. 14 each 0   risperiDONE (RISPERDAL) 1 MG tablet Take 1 tablet (1 mg total) by mouth at bedtime. 90 tablet 1   tadalafil (CIALIS) 5 MG tablet SMARTSIG:1-4 Tablet(s) By Mouth Daily PRN     traMADol (ULTRAM) 50 MG tablet TAKE 1 TABLET(50 MG) BY MOUTH EVERY 12 HOURS AS NEEDED 60 tablet 3   traZODone (DESYREL) 100 MG tablet Take 0.5-1 tablets (50-100 mg total) by mouth at bedtime. 60 tablet 1   UBRELVY 100 MG TABS TAKE 1 TABLET(100 MG) BY MOUTH 1 TIME EVERY 2 HOURS AS DIRECTED AS NEEDED. MAXIMUM 2 TABLETS IN 24 HOURS 16 tablet 5   valACYclovir (VALTREX) 500 MG tablet TAKE 1 TABLET(500 MG) BY MOUTH TWICE DAILY 6 tablet 3   verapamil (CALAN) 120 MG tablet Take by mouth daily as needed.     No current facility-administered medications for this visit.    Allergies:   Other, Codeine phosphate, and Penicillins    Social History:  The patient  reports that he has been smoking cigars and cigarettes. He started smoking about 58 years ago. He has a 13.25 pack-year smoking history. He quit smokeless tobacco use about 4 years ago. He reports current drug use. Drug: Marijuana. He reports that he does not drink alcohol.   Family History:  The patient's family history includes Anemia in his mother; Diabetes in his daughter; Hypertension in his daughter; Migraines in his daughter.    ROS:   Please see the history of present illness. (+) Right chest pain (+) Bilateral LE aches/pain (+) Lightheadedness All other systems are reviewed and negative.    PHYSICAL EXAM: VS:  BP (!) 142/80 (BP Location: Right Arm, Patient Position: Sitting, Cuff Size: Normal)   Pulse 98   Ht 5\' 6"  (1.676 m)   Wt 155 lb 9.6 oz (70.6 kg)   SpO2 98%   BMI 25.11 kg/m  , BMI Body mass index is 25.11 kg/m. GENERAL:  Well appearing HEENT: Pupils equal round and reactive, fundi not visualized, oral mucosa  unremarkable NECK:  No jugular venous distention, waveform within normal limits, carotid upstroke brisk and symmetric, no bruits LUNGS:  Clear to auscultation bilaterally HEART:  RRR.  PMI not displaced or sustained,S1 and S2 within normal limits, no S3, no S4, no clicks, no rubs, no murmurs ABD:  Flat, positive bowel sounds normal in frequency in pitch, no bruits, no rebound, no guarding, no midline pulsatile mass, no hepatomegaly, no splenomegaly EXT:  2 plus TP, 1+ DP bilaterally.  no edema, no cyanosis no clubbing SKIN:  No rashes no nodules NEURO:  Cranial nerves II through XII grossly intact, motor grossly intact throughout [facility]:  Cognitively intact, oriented to person place and time   EKG:   04/30/15: sinus bradycardia at 59 bpm. 03/30/12: Sinus rhythm. Rate 73 bpm. PVCs.  07/28/19: Sinus bradycardia.  Rate 57 bpm.  Non-specific T wave abnormalities.  02/21/2021: Sinus bradycardia. Rate 50 bpm.  TTE 12/03/07: LVEF 55-65%.  Trace MR.   LHC 07/03/04: 40% mid LAD, 30% mid LCx, 40% mid RCA, 20% distal RCA.  LV gram LVEF 50%.   Exercise Myoview 12/03/07: Exercised 7 minutes on Bruce protocol.  8 METS.  Adequate HR response.  No infarct or inducible ischemia.   30 day event monitor 04/28/18: Quality: Fair.  Baseline artifact. Predominant rhythm: sinus rhythm Average heart rate: 73 bpm Max heart rate: 151 bpm Min heart rate: 51 bpm   PACs and PVCs noted 5 beats NSVT   Carotid Doppler 04/30/18: 1-39% ICA stenosis biaterally   ABI 06/2018: 1.2 bilaterally   Lexiscan Myoview 06/2018:  LVEF 47%.  No ischemia.   ABI 07/20/2019: Right: Resting right ankle-brachial index is within normal range. No  evidence of significant right lower extremity arterial disease. The right  toe-brachial index is normal.   Left: Resting left ankle-brachial index is within normal range. No  evidence of significant left lower extremity arterial disease. The left  toe-brachial index is normal.  [place] AAA Duplex  09/13/2020: Abdominal Aorta: No evidence of an abdominal aortic aneurysm was  visualized. Right CIA 50-74% stenosis.   Recent Labs: 04/02/2021: TSH 4.033 04/11/2021: Magnesium 1.7 07/16/2021: ALT 8; BUN 13; Creatinine, Ser 1.00; Hemoglobin 14.2; Platelets 272; Potassium 3.5; Sodium 138    Lipid Panel    Component Value Date/Time   CHOL 100 12/03/2020 1411   TRIG 144 12/03/2020 1411   HDL 27 (L) 12/03/2020 1411   CHOLHDL 3.7 12/03/2020 1411   CHOLHDL 3.8 07/16/2015 1522   VLDL 37 (H) 07/16/2015 1522   LDLCALC 48 12/03/2020 1411   LDLDIRECT 156 (H) 06/09/2011 1555      Wt Readings from Last 3 Encounters:  08/05/21 155 lb 9.6 oz (70.6 kg)  07/16/21 158 lb (71.7 kg)  07/16/21 158 lb (71.7 kg)      Other studies Reviewed: Additional studies/ records that were reviewed today include: . Review of the above records demonstrates:  Please see elsewhere in the note.    ASSESSMENT AND PLAN: HYPERTENSION, BENIGN SYSTEMIC Blood pressure slightly above goal.  He notes that he has been well-controlled at home and he thinks it is higher because he is in the doctor's office.  I have asked him to track his blood pressures and bring to follow-up along with his machine.  Continue amlodipine, lisinopril, hydrochlorothiazide  CAD (coronary artery disease) He has nonobstructive CAD.  He has no angina.  He is going to work on increasing his exercise.  Continue aspirin and atorvastatin.  Beta-blockers are held due to bradycardia.  TOBACCO USER He is no longer smoking cigarettes but is still smoking cigars.  He plans to continue cutting back slightly.  He is not interested in any pharmacologic assistance.    Current medicines are reviewed at length with the patient today.  The patient does not have concerns regarding medicines.  The following changes have been made:  no change  Labs/ tests ordered today include:   No orders of the defined types were placed in this  encounter.    Disposition:   FU with  PharmD in 4-6 weeks. FU with Dr. Lilli Light. [place] in 6 months.  I,Mathew Stumpf,acting as a Education administrator for Skeet Latch, MD.,have documented all relevant documentation on the behalf of Skeet Latch, MD,as directed by  Skeet Latch, MD while in the presence of Skeet Latch, MD.  I, [place] Oval Linsey, MD have reviewed all documentation for this visit.  The documentation of the exam, diagnosis, procedures, and orders on 08/05/2021 are all accurate and complete.   Signed, Skeet Latch, MD  08/05/2021 11:03 PM    [place]

## 2021-08-05 NOTE — Assessment & Plan Note (Signed)
He is no longer smoking cigarettes but is still smoking cigars.  He plans to continue cutting back slightly.  He is not interested in any pharmacologic assistance.

## 2021-08-05 NOTE — Progress Notes (Signed)
I, [name], LAT, ATC, am serving as scribe for [name].  Jaime Barnes is a 70 y.o. male who presents to [facility] today for f/u of R hand paresthesias, particularly in his R 5th finger, due to ulnar and median neuropathy and CTS.  He was last seen by Dr. [name] on 07/02/21 and had a R cubital injection.  Today, pt reports R hand is not doing well and is swollen and very painful. Pt locates pain to across the carpals and MC. Pt notes pain is impacting his ADLs, like opening a bottle of water and gripping things.  Patient also notes significant lateral hip pain.  This has been ongoing for over a month.  Pain is worse when he lays on his left side at night when he stands from a seated position and when he stands for prolonged period of time.  He has not had any treatment yet for this.  Dx testing: 05/17/21 R UE arterial duplex US              04/24/21 R UE NCV/EMG  Pertinent review of systems: No fevers or chills  Relevant historical information: No known history of gout.   Exam:  BP (!) 152/88   Pulse (!) 105   Ht 5\' 6"  (1.676 m)   Wt 157 lb (71.2 kg)   SpO2 96%   BMI 25.34 kg/m  General: Well Developed, well nourished, and in no acute distress.   MSK: Right wrist mild erythema.  Large effusion.  Tender palpation dorsal wrist.  Decreased motion.  Left hip normal-appearing Tender palpation greater trochanter.  Hip abduction strength diminished 3+/5.    Lab and Radiology Results  Hip greater trochanteric injection: left Consent obtained and timeout performed. Area of maximum tenderness palpated and identified. Skin cleaned with alcohol, cold spray applied. A 22g needle was used to access the greater trochanteric bursa. 40mg  of Kenalog and 2 mL of lidocaine were used to inject the trochanteric bursa. Patient tolerated the procedure well.  X-ray images right wrist obtained today personally and independently interpreted No acute  fractures.  Chondrocalcinosis changes seen in the ulnar wrist.  No severe DJD present. Await formal radiology review   EXAM: DG HIP (WITH OR WITHOUT PELVIS) 2-3V RIGHT   COMPARISON:  None.   FINDINGS: There is no evidence of hip fracture or dislocation. There is no evidence of arthropathy or other focal bone abnormality. Vascular calcinosis.   IMPRESSION: No fracture or dislocation of the right hip. The hip joint space is preserved.     Electronically Signed   By: Delanna Ahmadi M.D.   On: 07/11/2021 10:32 I, [name], personally (independently) visualized and performed the interpretation of the images attached in this note.    Chemistry      Component Value Date/Time   NA 138 07/16/2021 1021   NA 142 02/26/2021 1256   K 3.5 07/16/2021 1021   CL 104 07/16/2021 1021   CO2 21 (L) 07/16/2021 1021   BUN 13 07/16/2021 1021   BUN 16 02/26/2021 1256   CREATININE 1.00 07/16/2021 1021   CREATININE 1.52 (H) 12/05/2015 1117      Component Value Date/Time   CALCIUM 10.3 07/16/2021 1021   ALKPHOS 85 07/16/2021 1021   AST 11 (L) 07/16/2021 1021   ALT 8 07/16/2021 1021   BILITOT 0.5 07/16/2021 1021   BILITOT <0.2 12/03/2020 1411        Assessment and Plan: 70 y.o.  male with right wrist pain and effusion without clear explanation or cause.  Concerning for gout or possibly pseudogout.  Radiology x-ray overread is still pending.  Check uric acid now and consider colchicine.  Recheck in 2 weeks.  We will consider steroid injection at that time if needed.  Left hip pain.  Pain due to greater trochanteric bursitis/hip abductor tendinopathy.  Plan for injection greater trochanter today and recheck in 2 weeks.  Home exercise program teaching today and reinforced next visit.   PDMP not reviewed this encounter. Orders Placed This Encounter  Procedures   DG Wrist Complete Right    Standing Status:   Future    Number of Occurrences:   1    Standing Expiration Date:   08/06/2022     Order Specific Question:   Reason for Exam (SYMPTOM  OR DIAGNOSIS REQUIRED)    Answer:   right wrist pain    Order Specific Question:   Preferred imaging location?    Answer:   [name]   Uric acid    Standing Status:   Future    Number of Occurrences:   1    Standing Expiration Date:   08/06/2022   No orders of the defined types were placed in this encounter.    Discussed warning signs or symptoms. Please see discharge instructions. Patient expresses understanding.   The above documentation has been reviewed and is accurate and complete [name], M.D.

## 2021-08-06 ENCOUNTER — Ambulatory Visit (INDEPENDENT_AMBULATORY_CARE_PROVIDER_SITE_OTHER): Payer: Medicare Other

## 2021-08-06 ENCOUNTER — Telehealth: Payer: Self-pay | Admitting: Internal Medicine

## 2021-08-06 ENCOUNTER — Ambulatory Visit (INDEPENDENT_AMBULATORY_CARE_PROVIDER_SITE_OTHER): Payer: Medicare Other | Admitting: Family Medicine

## 2021-08-06 VITALS — BP 152/88 | HR 105 | Ht 66.0 in | Wt 157.0 lb

## 2021-08-06 DIAGNOSIS — R202 Paresthesia of skin: Secondary | ICD-10-CM

## 2021-08-06 DIAGNOSIS — M7062 Trochanteric bursitis, left hip: Secondary | ICD-10-CM | POA: Diagnosis not present

## 2021-08-06 DIAGNOSIS — R200 Anesthesia of skin: Secondary | ICD-10-CM | POA: Diagnosis not present

## 2021-08-06 LAB — URIC ACID: Uric Acid, Serum: 5 mg/dL (ref 4.0–7.8)

## 2021-08-06 NOTE — Patient Instructions (Addendum)
Thank you for coming in today.   You received an injection today. Seek immediate medical attention if the joint becomes red, extremely painful, or is oozing fluid.   Please get an Xray today before you leave   Please get labs today before you leave   Recheck back in 2 weeks

## 2021-08-06 NOTE — Telephone Encounter (Signed)
I have spoke to the pharmacist, they are getting a refill ready for the pt.   Pt is aware of above.

## 2021-08-06 NOTE — Telephone Encounter (Signed)
Patient calling in about valACYclovir (VALTREX) 500 MG tablet  Patient says everytime he goes to refill rx w/ pharmacy they tell him that they need to contact doctor.. last rx was sent 10/27 w/ 3 refills but he said they would not fill it for him  Please follow up w/ pharmacy  [id number] [place], [place] - [place]  Phone:  [phone] Fax:  [phone]

## 2021-08-07 ENCOUNTER — Telehealth: Payer: Self-pay | Admitting: Family Medicine

## 2021-08-07 MED ORDER — COLCHICINE 0.6 MG PO TABS: 0.3000 mg | ORAL_TABLET | Freq: Every day | ORAL | 2 refills | Status: DC | PRN

## 2021-08-07 NOTE — Progress Notes (Signed)
Uric acid test is normal indicating that gout is unlikely. I am worried about pseudogout based on my interpretation of your x-ray.  I have prescribed colchicine to the [facility] on [address] and [address]. Please take a half a pill daily as needed for wrist pain and swelling Follow-up with me as scheduled.  If not better I can do an injection which I think would help.

## 2021-08-07 NOTE — Telephone Encounter (Signed)
Prescribed colchicine.  Adjusted dose based on coadministration of verapamil and statin.

## 2021-08-08 NOTE — Progress Notes (Signed)
No fractures are present.  Possible pseudogout is present.  I prescribed colchicine yesterday for this.

## 2021-08-15 ENCOUNTER — Telehealth: Payer: Self-pay | Admitting: Internal Medicine

## 2021-08-15 ENCOUNTER — Other Ambulatory Visit: Payer: Self-pay | Admitting: Internal Medicine

## 2021-08-15 DIAGNOSIS — F319 Bipolar disorder, unspecified: Secondary | ICD-10-CM

## 2021-08-15 NOTE — Telephone Encounter (Signed)
Patient calling in  Says he has not taken lisinopril-hydrochlorothiazide (ZESTORETIC) 20-12.5 MG tablet since his last ED visit bc they told him to stop taking it.. still showing as active on patient med list  Patient wants to know if he needs to be taking this medication or not.. please call [phone]

## 2021-08-16 ENCOUNTER — Other Ambulatory Visit: Payer: Self-pay | Admitting: Internal Medicine

## 2021-08-19 NOTE — Progress Notes (Signed)
   I, [name], LAT, ATC, am serving as scribe for [name].  Eldo Umanzor is a 70 y.o. male who presents to [facility] today for f/u of L lateral hip pain and R hand paresthesias, particularly in his R 5th finger, due to ulnar and median neuropathy and CTS and possible pseudogout.  He was last seen by Dr. [name] on 08/06/21 and noted worsening R hand pain and swelling and reported L lateral hip pain.  He had a L GT injection and labs for uric acid. Today, pt reports R hand is feeling much better and the L hip is not bothering him.   Patient is taking colchicine for presumed pseudogout for his right wrist.  Dx testing: R wrist XR- 08/06/21             05/17/21 R UE arterial duplex US              04/24/21 R UE NCV/EMG  Pertinent review of systems: No fevers or chills  Relevant historical information: PAD.  CAD.  Cirrhosis.   Exam:  BP 118/80   Pulse 72   Ht 5\' 6"  (1.676 m)   Wt 159 lb 12.8 oz (72.5 kg)   SpO2 98%   BMI 25.79 kg/m  General: Well Developed, well nourished, and in no acute distress.   MSK: Right wrist normal-appearing no significant swelling.  Nontender normal wrist motion.  Intact strength.  Left hip normal motion.  Nontender.    Lab and Radiology Results EXAM: RIGHT WRIST - COMPLETE 3+ VIEW   COMPARISON:  None.   FINDINGS: No fracture or dislocation is seen. There are faint calcifications in the soft tissues suggesting chondrocalcinosis. There are small bony spurs in first carpometacarpal and first metacarpophalangeal joints. There are tiny radiopacities in the soft tissues adjacent to the base of the fifth metacarpal, possibly residual from previous injury. There is questionable minimal deformity in the distal shaft of ulna without break in the cortical margins. This may be normal variation or residual change from previous trauma. If there are continued symptoms, follow-up radiographs of right forearm may  be considered.   IMPRESSION: No recent fracture or dislocation is seen in right wrist. Chondrocalcinosis suggests degenerative changes.   Other findings as described in the body of the report.     Electronically Signed   By: Elmer Picker M.D.   On: 08/07/2021 14:37 I, [name], personally (independently) visualized and performed the interpretation of the images attached in this note.     Assessment and Plan: 70 y.o. male with left hip pain due to trochanteric bursitis.  Significantly improved following injection.  Focus on hip abduction stretching and strengthening.  Exercises taught in clinic today by ATC.  Right wrist pain significantly improved with colchicine.  Patient has chondrocalcinosis on x-ray.  Pain thought to be due to pseudogout.  Plan to use colchicine as needed.  Trial off colchicine for now.     Discussed warning signs or symptoms. Please see discharge instructions. Patient expresses understanding.   The above documentation has been reviewed and is accurate and complete [name], M.D.   Total encounter time 20 minutes including face-to-face time with the patient and, reviewing past medical record, and charting on the date of service.   Reviewed x-ray and discussed pseudogout in detail.

## 2021-08-20 ENCOUNTER — Ambulatory Visit (INDEPENDENT_AMBULATORY_CARE_PROVIDER_SITE_OTHER): Payer: Medicare Other

## 2021-08-20 ENCOUNTER — Ambulatory Visit (INDEPENDENT_AMBULATORY_CARE_PROVIDER_SITE_OTHER): Payer: Medicare Other | Admitting: Family Medicine

## 2021-08-20 ENCOUNTER — Other Ambulatory Visit: Payer: Self-pay

## 2021-08-20 VITALS — BP 118/80 | HR 72 | Ht 66.0 in | Wt 159.8 lb

## 2021-08-20 DIAGNOSIS — M1120 Other chondrocalcinosis, unspecified site: Secondary | ICD-10-CM | POA: Insufficient documentation

## 2021-08-20 DIAGNOSIS — M25531 Pain in right wrist: Secondary | ICD-10-CM

## 2021-08-20 DIAGNOSIS — M11231 Other chondrocalcinosis, right wrist: Secondary | ICD-10-CM | POA: Diagnosis not present

## 2021-08-20 DIAGNOSIS — M7062 Trochanteric bursitis, left hip: Secondary | ICD-10-CM

## 2021-08-20 DIAGNOSIS — Z0000 Encounter for general adult medical examination without abnormal findings: Secondary | ICD-10-CM

## 2021-08-20 NOTE — Patient Instructions (Addendum)
Thank you for coming in today.   Please complete the exercises that the athletic trainer went over with you:  View at [email or web address] using code: [email or web address]  Recheck back as needed

## 2021-08-20 NOTE — Progress Notes (Signed)
I connected with Jaime Barnes today by telephone and verified that I am speaking with the correct person using two identifiers. Location patient: home Location provider: work Persons participating in the virtual visit: patient, provider.   I discussed the limitations, risks, security and privacy concerns of performing an evaluation and management service by telephone and the availability of in person appointments. I also discussed with the patient that there may be a patient responsible charge related to this service. The patient expressed understanding and verbally consented to this telephonic visit.    Interactive audio and video telecommunications were attempted between this provider and patient, however failed, due to patient having technical difficulties OR patient did not have access to video capability.  We continued and completed visit with audio only.  Some vital signs may be absent or patient reported.   Time Spent with patient on telephone encounter: 40 minutes  Subjective:   Jaime Barnes is a 70 y.o. male who presents for Medicare Annual/Subsequent preventive examination.  Review of Systems     Cardiac Risk Factors include: advanced age (>50men, >57 women);dyslipidemia;hypertension;male gender;smoking/ tobacco exposure     Objective:    There were no vitals filed for this visit. There is no height or weight on file to calculate BMI.  Advanced Directives 07/16/2021 04/02/2021 11/05/2020 04/03/2020 02/20/2020 06/16/2019 02/14/2019  Does Patient Have a Medical Advance Directive? No No No No Yes No Yes  Type of Advance Directive - - - - Event organiser;Living will  Does patient want to make changes to medical advance directive? - - - - No - Patient declined - No - Patient declined  Copy of [place] in Chart? - - - - Yes - validated most recent copy scanned in chart (See row information) - No - copy requested  Would patient  like information on creating a medical advance directive? - No - Patient declined - Yes (MAU/Ambulatory/Procedural Areas - Information given) - No - Patient declined -    Current Medications (verified) Outpatient Encounter Medications as of 08/20/2021  Medication Sig   albuterol (VENTOLIN HFA) 108 (90 Base) MCG/ACT inhaler Inhale 1-2 puffs into the lungs every 6 (six) hours as needed for wheezing or shortness of breath.   amLODipine (NORVASC) 10 MG tablet TAKE 1 TABLET(10 MG) BY MOUTH DAILY   aspirin 81 MG chewable tablet Chew 81 mg by mouth daily.   atorvastatin (LIPITOR) 80 MG tablet Take 1 tablet (80 mg total) by mouth daily.   colchicine 0.6 MG tablet Take 0.5 tablets (0.3 mg total) by mouth daily as needed (gout or psuedogout pain).   gabapentin (NEURONTIN) 300 MG capsule Take 1 capsule (300 mg total) by mouth 2 (two) times daily.   LORazepam (ATIVAN) 0.5 MG tablet 1-2 tabs 30 - 60 min prior to injection. Do not drive with this medicine.   MAGNESIUM-OXIDE 400 (240 Mg) MG tablet Take 1 tablet by mouth daily.   meloxicam (MOBIC) 15 MG tablet Take 15 mg by mouth daily.   omeprazole (PRILOSEC) 20 MG capsule Take 20 mg by mouth 2 (two) times daily.   polyethylene glycol (MIRALAX / GLYCOLAX) 17 g packet Take 17 g by mouth daily.   risperiDONE (RISPERDAL) 1 MG tablet TAKE 1 TABLET(1 MG) BY MOUTH AT BEDTIME   tadalafil (CIALIS) 5 MG tablet SMARTSIG:1-4 Tablet(s) By Mouth Daily PRN   traMADol (ULTRAM) 50 MG tablet TAKE 1 TABLET(50 MG) BY MOUTH EVERY 12 HOURS AS NEEDED  traZODone (DESYREL) 100 MG tablet Take 0.5-1 tablets (50-100 mg total) by mouth at bedtime.   UBRELVY 100 MG TABS TAKE 1 TABLET(100 MG) BY MOUTH 1 TIME EVERY 2 HOURS AS DIRECTED AS NEEDED. MAXIMUM 2 TABLETS IN 24 HOURS   valACYclovir (VALTREX) 500 MG tablet TAKE 1 TABLET(500 MG) BY MOUTH TWICE DAILY   verapamil (CALAN) 120 MG tablet Take by mouth daily as needed.   No facility-administered encounter medications on file as of  08/20/2021.    Allergies (verified) Other, Codeine phosphate, and Penicillins   History: Past Medical History:  Diagnosis Date   Barrett's esophagus 2011   Recommend repeat EGD for surveillance in 2 yrs-[facility]   Bipolar disorder ([place])    Chronic kidney disease    Cirrhosis ([place])    Diverticulosis of colon    GANGLION CYST, TENDON SHEATH 06/26/2010   Hyperlipidemia    Hypertension    Hypertriglyceridemia 01/21/2019   Impotence    Migraine    Pancreatitis    Pulmonary nodule    Pulmonary nodule seen on imaging study 02/06/2011   Followup CT in December 2014 with 20 months of stability at 4 mm.  No further imaging recommended    Past Surgical History:  Procedure Laterality Date   CARDIAC CATHETERIZATION  2005   Nonobstructive, <40%   TONSILLECTOMY Bilateral 1961   Family History  Problem Relation Age of Onset   Anemia Mother    Hypertension Daughter    Diabetes Daughter    Migraines Daughter    Social History   Socioeconomic History   Marital status: Legally Separated    Spouse name: Not on file   Number of children: 8   Years of education: 8   Highest education level: 8th grade  Occupational History   Not on file  Tobacco Use   Smoking status: Light Smoker    Types: Cigars   Smokeless tobacco: Former    Quit date: 03/08/2017   Tobacco comments:    cigar occassionally   Vaping Use   Vaping Use: Never used  Substance and Sexual Activity   Alcohol use: No    Comment: Last drink "30 years ago"   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    Sexual activity: Yes    Birth control/protection: Condom    Comment: Multiple sex partners. 60 in last year.  Other Topics Concern   Not on file  Social History Narrative   Pt lives alone. No stairs in home. Pt has completed 8th grade.       Caffeine 3 cups coffee day      Exercise - yes walks and plays tennis   Social Determinants of Health   Financial Resource Strain: Low  Risk    Difficulty of Paying Living Expenses: Not hard at all  Food Insecurity: No Food Insecurity   Worried About Charity fundraiser in the Last Year: Never true   Ran Out of Food in the Last Year: Never true  Transportation Needs: No Transportation Needs   Lack of Transportation (Medical): No   Lack of Transportation (Non-Medical): No  Physical Activity: Sufficiently Active   Days of Exercise per Week: 5 days   Minutes of Exercise per Session: 30 min  Stress: No Stress Concern Present   Feeling of Stress : Not at all  Social Connections: Moderately Integrated   Frequency of Communication with Friends and Family: More than three times a week  Frequency of Social Gatherings with Friends and Family: More than three times a week   Attends Religious Services: 1 to 4 times per year   Active Member of [employer] or Organizations: Yes   Attends Archivist Meetings: Never   Marital Status: Separated    Tobacco Counseling Ready to quit: Not Answered Counseling given: Not Answered Tobacco comments: cigar occassionally    Clinical Intake:  Pre-visit preparation completed: Yes  Pain : No/denies pain     Nutritional Risks: None Diabetes: No  How often do you need to have someone help you when you read instructions, pamphlets, or other written materials from your doctor or pharmacy?: 1 - Never What is the last grade level you completed in school?: 8th grade  Diabetic? no  Interpreter Needed?: No  Information entered by :: Lisette Abu, LPN   Activities of Daily Living In your present state of health, do you have any difficulty performing the following activities: 08/20/2021 04/03/2021  Hearing? N N  Vision? N N  Difficulty concentrating or making decisions? N N  Walking or climbing stairs? N N  Dressing or bathing? N N  Doing errands, shopping? N N  Preparing Food and eating ? N -  Using the Toilet? N -  In the past six months, have you accidently leaked urine?  N -  Do you have problems with loss of bowel control? N -  Managing your Medications? N -  Managing your Finances? N -  Housekeeping or managing your Housekeeping? N -  Some recent data might be hidden    Patient Care Team: Janith Lima, MD as PCP - General (Internal Medicine) Skeet Latch, MD as PCP - Cardiology (Cardiology) Lovena Neighbours, DMD (Dentistry) Skeet Latch, MD as Attending Physician (Cardiology) Wilford Corner, MD as Consulting Physician (Gastroenterology) Pieter Partridge, DO as Consulting Physician (Neurology) [facility] as Consulting Physician (Ophthalmology)  Indicate any recent Medical Services you may have received from other than [facility] providers in the past year (date may be approximate).     Assessment:   This is a routine wellness examination for [place].  Hearing/Vision screen Hearing Screening - Comments:: Patient declined any hearing difficulty. No hearing aids. Vision Screening - Comments:: Patient wears eyeglasses. Eye exam done by: [place] issues and exercise activities discussed: Current Exercise Habits: Home exercise routine, Type of exercise: walking, Time (Minutes): 30, Frequency (Times/Week): 5, Weekly Exercise (Minutes/Week): 150, Intensity: Moderate, Exercise limited by: None identified   Goals Addressed   None   Depression Screen PHQ 2/9 Scores 08/20/2021 12/03/2020 09/06/2020 08/09/2020 03/12/2020 02/20/2020 09/20/2019  PHQ - 2 Score 0 0 0 0 0 0 0  PHQ- 9 Score - - - - - - -    Fall Risk Fall Risk  08/20/2021 04/05/2021 12/03/2020 11/05/2020 09/06/2020  Falls in the past year? 0 1 0 0 0  Number falls in past yr: 0 1 0 0 0  Comment - - - - -  Injury with Fall? 0 0 0 0 0  Risk for fall due to : No Fall Risks - - - -  Follow up Falls evaluation completed - Falls evaluation completed - Falls evaluation completed    [place]:  Any stairs in or around the home? No   If so, are there any without handrails? No  Home free of loose throw rugs in walkways, pet beds, electrical cords, etc? Yes  Adequate lighting in your  home to reduce risk of falls? Yes   ASSISTIVE DEVICES UTILIZED TO PREVENT FALLS:  Life alert? No  Use of a cane, walker or w/c? No  Grab bars in the bathroom? No  Shower chair or bench in shower? No  Elevated toilet seat or a handicapped toilet? No   TIMED UP AND GO:  Was the test performed? No .  Length of time to ambulate 10 feet: n/a sec.   Gait steady and fast without use of assistive device  Cognitive Function: Normal cognitive status assessed by direct observation by this Nurse Health Advisor. No abnormalities found.       6CIT Screen 02/20/2020 02/14/2019  What Year? 0 points 0 points  What month? 0 points 0 points  What time? - 0 points  Count back from 20 - 0 points  Months in reverse 4 points 0 points  Repeat phrase - 2 points  Total Score - 2    Immunizations Immunization History  Administered Date(s) Administered   Fluad Quad(high Dose 65+) 08/15/2019, 07/11/2021   Influenza Split 08/24/2012   Influenza Whole 09/02/2007, 06/23/2008, 06/09/2011   Influenza, High Dose Seasonal PF 06/27/2017, 06/25/2018   Influenza,inj,Quad PF,6+ Mos 07/29/2013, 06/11/2020   Influenza-Unspecified 08/24/2012, 08/29/2016, 06/27/2017   PFIZER(Purple Top)SARS-COV-2 Vaccination 12/30/2019, 01/23/2020   Pneumococcal Conjugate-13 03/18/2016   Pneumococcal Polysaccharide-23 03/27/2017   Tdap 06/09/2011    TDAP status: Due, Education has been provided regarding the importance of this vaccine. Advised may receive this vaccine at local pharmacy or Health Dept. Aware to provide a copy of the vaccination record if obtained from local pharmacy or Health Dept. Verbalized acceptance and understanding.  Flu Vaccine status: Up to date  Pneumococcal vaccine status: Up to date  Covid-19 vaccine status: Completed vaccines  Qualifies for  Shingles Vaccine? Yes   Zostavax completed No   Shingrix Completed?: No.    Education has been provided regarding the importance of this vaccine. Patient has been advised to call insurance company to determine out of pocket expense if they have not yet received this vaccine. Advised may also receive vaccine at local pharmacy or Health Dept. Verbalized acceptance and understanding. (Need documentation of second dose)  Screening Tests Health Maintenance  Topic Date Due   Zoster Vaccines- Shingrix (1 of 2) Never done   COVID-19 Vaccine (3 - Booster for Pfizer series) 03/19/2020   TETANUS/TDAP  06/08/2021   Fecal DNA (Cologuard)  03/30/2023   Pneumonia Vaccine 71+ Years old  Completed   INFLUENZA VACCINE  Completed   Hepatitis C Screening  Completed   HPV VACCINES  Aged Out   COLONOSCOPY (Pts 45-72yrs Insurance coverage will need to be confirmed)  [place] Maintenance Due  Topic Date Due   Zoster Vaccines- Shingrix (1 of 2) Never done   COVID-19 Vaccine (3 - Booster for Pfizer series) 03/19/2020   TETANUS/TDAP  06/08/2021    Colorectal cancer screening: Type of screening: Cologuard. Completed 04/04/2020. Repeat every 3 years  Lung Cancer Screening: (Low Dose CT Chest recommended if Age 44-80 years, 30 pack-year currently smoking OR have quit w/in 15years.) does qualify.   Lung Cancer Screening Referral: no  Additional Screening:  Hepatitis C Screening: does qualify; Completed yes  Vision Screening: Recommended annual ophthalmology exams for early detection of glaucoma and other disorders of the eye. Is the patient up to date with their annual eye exam?  Yes  Who is the provider or what is the name of the office  in which the patient attends annual eye exams? [facility] If pt is not established with a provider, would they like to be referred to a provider to establish care? No .   Dental Screening: Recommended annual dental exams for proper oral  hygiene  Community Resource Referral / Chronic Care Management: CRR required this visit?  No   CCM required this visit?  No      Plan:     I have personally reviewed and noted the following in the patient's chart:   Medical and social history Use of alcohol, tobacco or illicit drugs  Current medications and supplements including opioid prescriptions. Patient is not currently taking opioid prescriptions. Functional ability and status Nutritional status Physical activity Advanced directives List of other physicians Hospitalizations, surgeries, and ER visits in previous 12 months Vitals Screenings to include cognitive, depression, and falls Referrals and appointments  In addition, I have reviewed and discussed with patient certain preventive protocols, quality metrics, and best practice recommendations. A written personalized care plan for preventive services as well as general preventive health recommendations were provided to patient.     Sheral Flow, LPN   06/21/3006   Nurse Notes:  Patient is cogitatively intact. There were no vitals filed for this visit. There is no height or weight on file to calculate BMI. Hearing Screening - Comments:: Patient declined any hearing difficulty. No hearing aids. Vision Screening - Comments:: Patient wears eyeglasses. Eye exam done by: [facility]

## 2021-08-25 ENCOUNTER — Other Ambulatory Visit: Payer: Self-pay | Admitting: Neurology

## 2021-08-26 DIAGNOSIS — R1084 Generalized abdominal pain: Secondary | ICD-10-CM | POA: Diagnosis not present

## 2021-08-26 DIAGNOSIS — K5900 Constipation, unspecified: Secondary | ICD-10-CM | POA: Diagnosis not present

## 2021-08-28 ENCOUNTER — Other Ambulatory Visit: Payer: Self-pay

## 2021-08-28 ENCOUNTER — Ambulatory Visit (INDEPENDENT_AMBULATORY_CARE_PROVIDER_SITE_OTHER): Payer: Medicare Other

## 2021-08-28 DIAGNOSIS — D510 Vitamin B12 deficiency anemia due to intrinsic factor deficiency: Secondary | ICD-10-CM | POA: Diagnosis not present

## 2021-08-28 MED ORDER — CYANOCOBALAMIN 1000 MCG/ML IJ SOLN
1000.0000 ug | Freq: Once | INTRAMUSCULAR | Status: AC
  Administered 2021-08-28: 1000 ug via INTRAMUSCULAR

## 2021-08-28 NOTE — Progress Notes (Signed)
Pt was given B12 w/o any complications. 

## 2021-09-05 ENCOUNTER — Other Ambulatory Visit: Payer: Self-pay | Admitting: Cardiovascular Disease

## 2021-09-05 ENCOUNTER — Other Ambulatory Visit: Payer: Self-pay | Admitting: Internal Medicine

## 2021-09-05 DIAGNOSIS — K2270 Barrett's esophagus without dysplasia: Secondary | ICD-10-CM

## 2021-09-05 DIAGNOSIS — J45990 Exercise induced bronchospasm: Secondary | ICD-10-CM

## 2021-09-05 DIAGNOSIS — K2100 Gastro-esophageal reflux disease with esophagitis, without bleeding: Secondary | ICD-10-CM

## 2021-09-05 MED ORDER — ALBUTEROL SULFATE HFA 108 (90 BASE) MCG/ACT IN AERS: 1.0000 | INHALATION_SPRAY | Freq: Four times a day (QID) | RESPIRATORY_TRACT | 3 refills | Status: DC | PRN

## 2021-09-05 MED ORDER — OMEPRAZOLE 20 MG PO CPDR: 20.0000 mg | DELAYED_RELEASE_CAPSULE | Freq: Two times a day (BID) | ORAL | 1 refills | Status: DC

## 2021-09-09 ENCOUNTER — Ambulatory Visit: Payer: Medicare Other | Admitting: Podiatry

## 2021-09-12 ENCOUNTER — Telehealth (HOSPITAL_BASED_OUTPATIENT_CLINIC_OR_DEPARTMENT_OTHER): Payer: Self-pay | Admitting: Cardiovascular Disease

## 2021-09-12 NOTE — Telephone Encounter (Signed)
°*  STAT* If patient is at the pharmacy, call can be transferred to refill team.   1. Which medications need to be refilled? (please list name of each medication and dose if known) MAGNESIUM-OXIDE 400 (240 Mg) MG tablet  2. Which pharmacy/location (including street and city if local pharmacy) is medication to be sent to? [facility] [place], [place]  3. Do they need a 30 day or 90 day supply? [place]

## 2021-09-13 MED ORDER — MAGNESIUM-OXIDE 400 (240 MG) MG PO TABS: 1.0000 | ORAL_TABLET | Freq: Every day | ORAL | 3 refills | Status: DC

## 2021-09-17 IMAGING — US US ABDOMEN COMPLETE
1 series · 14 of 25 positions shown · non-contrast
Comparison: None.

CLINICAL DATA: Cirrhosis

EXAM:
ABDOMEN ULTRASOUND COMPLETE

[Series 1: us abdomen complete · 0.19mm/px · 14 of 105 slices shown]
[im 1/105]
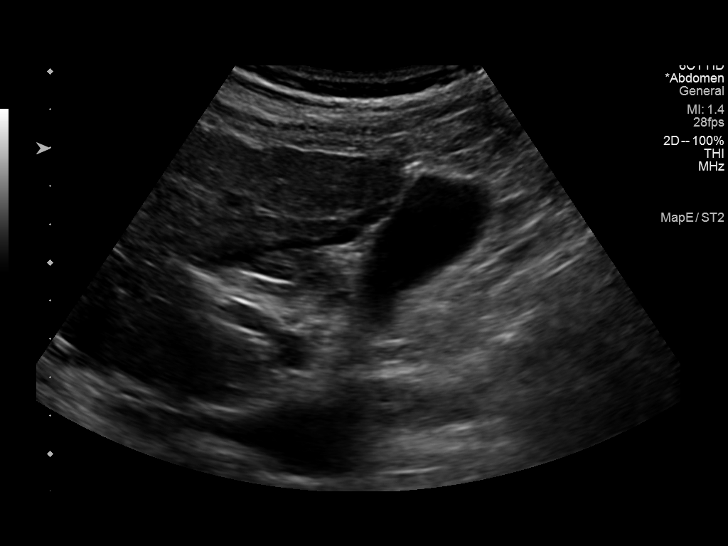
[im 9/105]
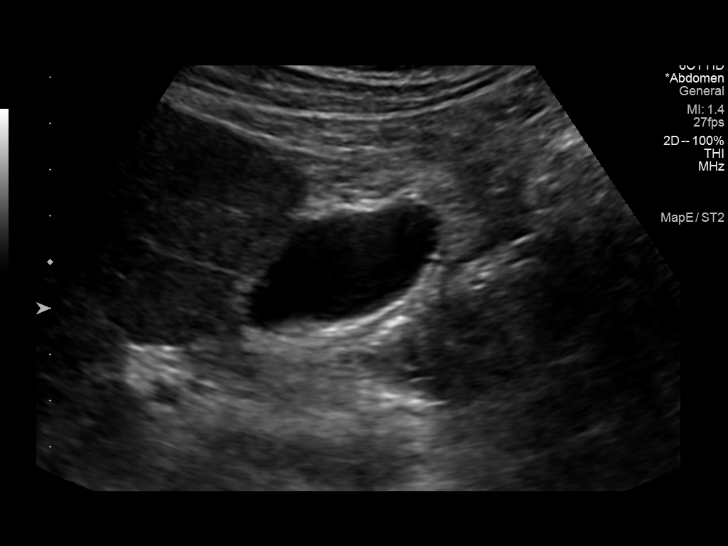
[im 18/105]
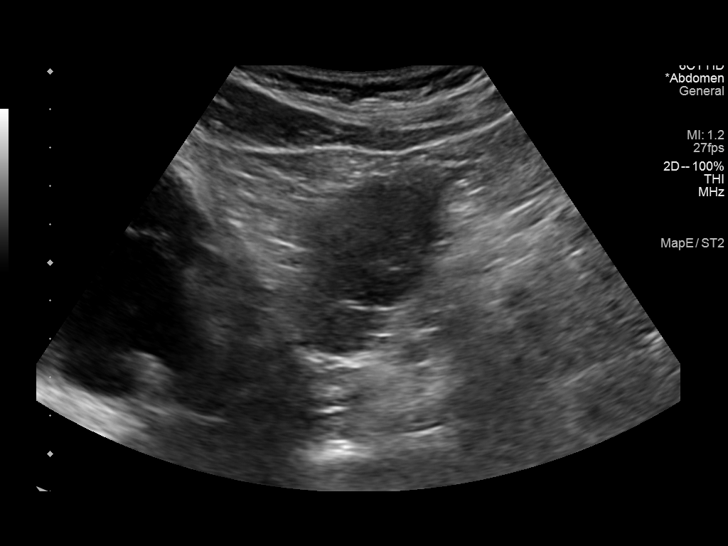
[im 27/105]
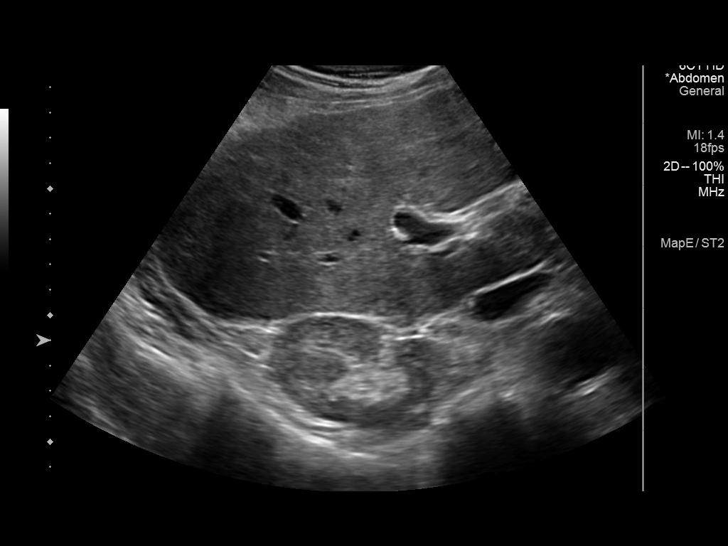
[im 35/105]
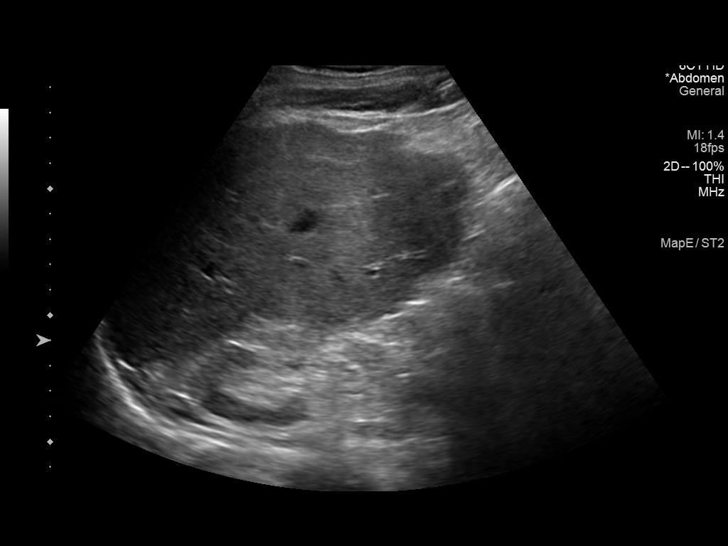
[im 40/105]
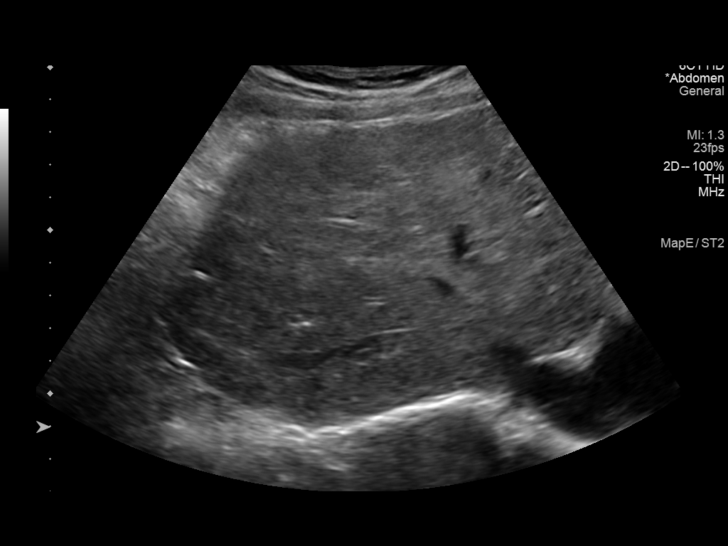
[im 48/105]
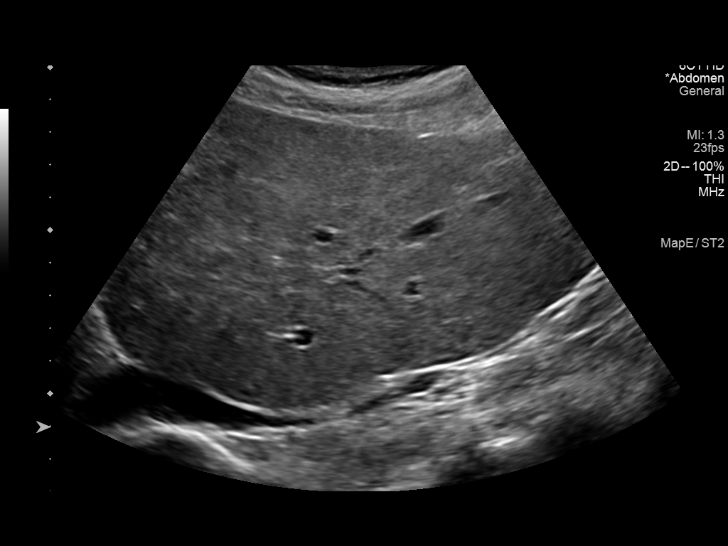
[im 57/105]
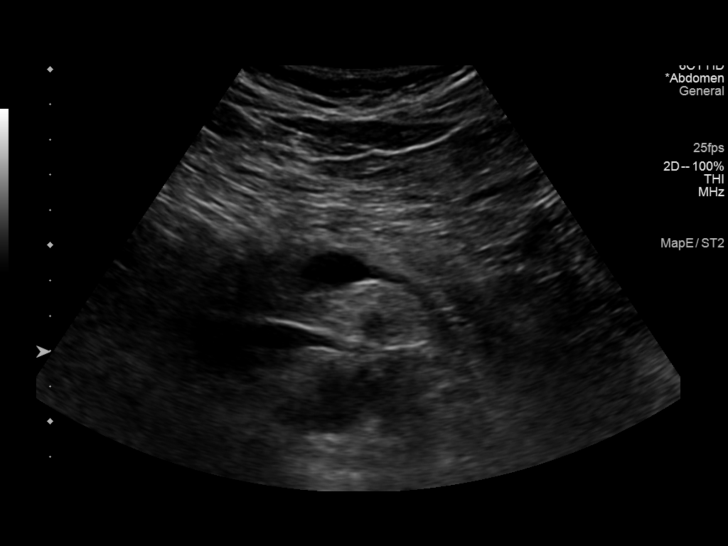
[im 66/105]
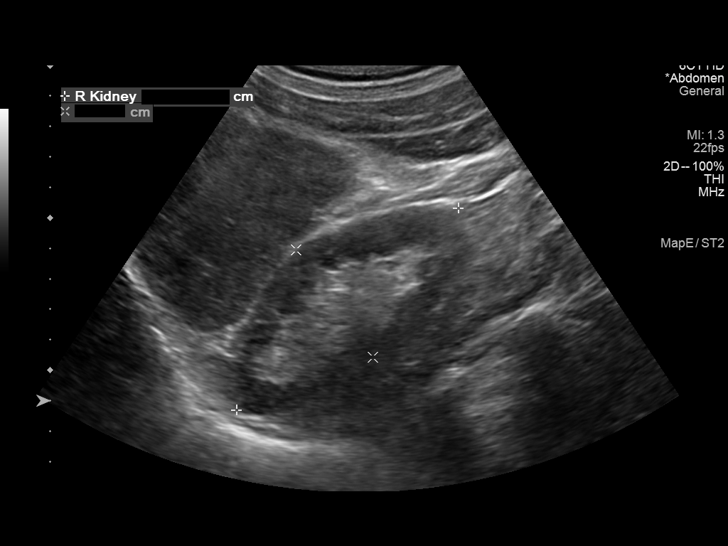
[im 70/105]
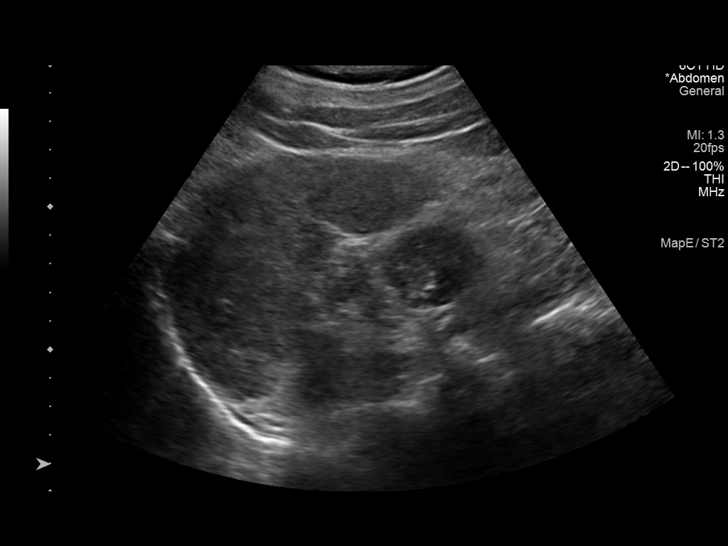
[im 79/105]
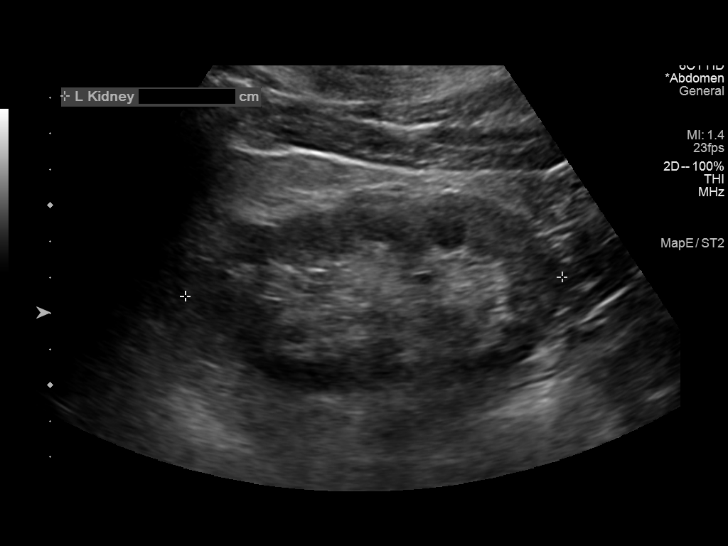
[im 87/105]
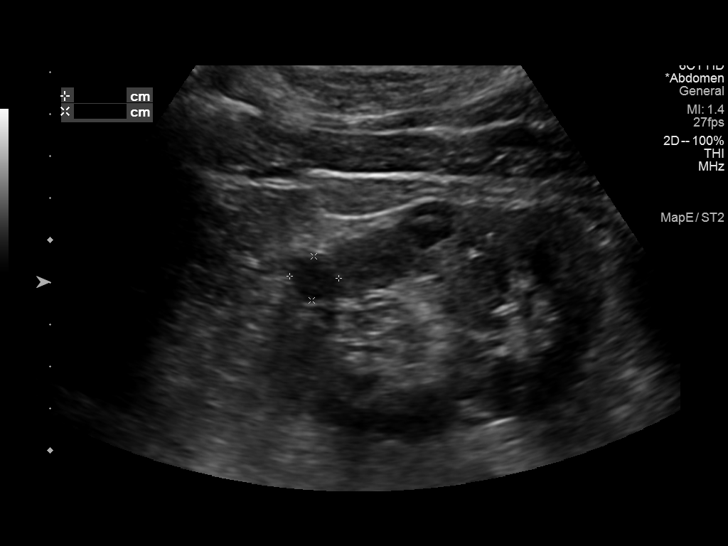
[im 96/105]
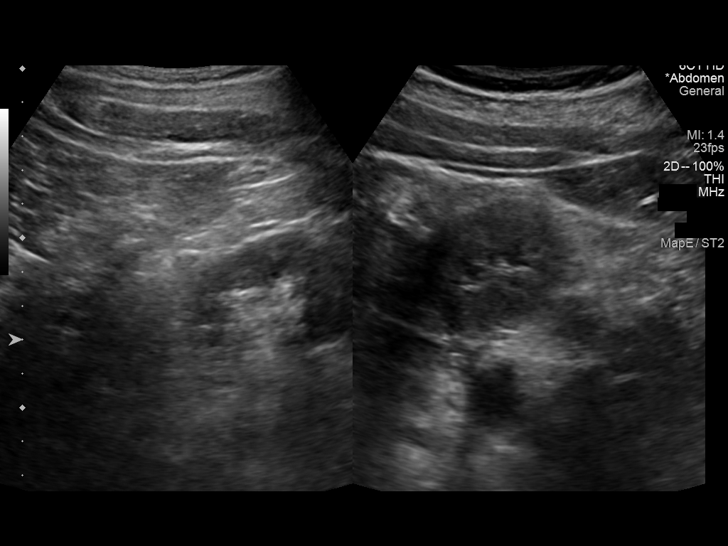
[im 105/105]
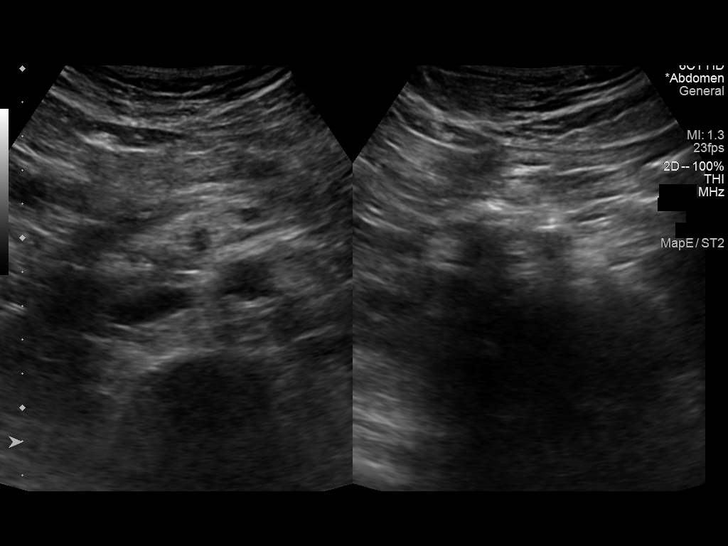

[14 of 25 positions shown; findings below may reference images not displayed]

FINDINGS: Gallbladder: No gallstones or wall thickening visualized. No
sonographic Murphy sign noted by sonographer.

Common bile duct: Diameter: Normal, 2 mm

Liver: Heterogeneous, coarsened echotexture with nodular contours
compatible with cirrhosis. No focal hepatic abnormality. Portal vein
is patent on color Doppler imaging with normal direction of blood
flow towards the liver.

IVC: No abnormality visualized.

Pancreas: Visualized portion unremarkable.

Spleen: Size and appearance within normal limits.

Right Kidney: Length: 9.8 cm. Echogenicity within normal limits. No
mass or hydronephrosis visualized.

Left Kidney: Length: 10.5 cm. Normal echotexture. No hydronephrosis.
212 mm cysts noted in the upper pole and midpole of the left kidney.

Abdominal aorta: No aneurysm visualized.

Other findings: None.
IMPRESSION: Changes of cirrhosis.  No focal hepatic abnormality.

Small left renal cysts.

No acute findings.

## 2021-09-18 ENCOUNTER — Telehealth: Payer: Self-pay | Admitting: Internal Medicine

## 2021-09-18 NOTE — Telephone Encounter (Signed)
1.Medication Requested: valACYclovir (VALTREX) 500 MG tablet  2. Pharmacy (Name, [place]): [address], [place]  Phone:  [phone] Fax:  [phone]   3. On Med List: yes  4. Last Visit with PCP: 10.13.22  5. Next visit date with PCP: n/a   Agent: Please be advised that RX refills may take up to 3 business days. We ask that you follow-up with your pharmacy.

## 2021-09-19 ENCOUNTER — Other Ambulatory Visit: Payer: Self-pay | Admitting: Internal Medicine

## 2021-09-19 DIAGNOSIS — B009 Herpesviral infection, unspecified: Secondary | ICD-10-CM

## 2021-09-19 MED ORDER — VALACYCLOVIR HCL 500 MG PO TABS: 500.0000 mg | ORAL_TABLET | Freq: Every day | ORAL | 1 refills | Status: DC

## 2021-09-20 ENCOUNTER — Encounter (HOSPITAL_COMMUNITY): Payer: Self-pay

## 2021-09-20 ENCOUNTER — Emergency Department (HOSPITAL_COMMUNITY)
Admission: EM | Admit: 2021-09-20 | Discharge: 2021-09-20 | Disposition: A | Discharge status: Home / Self Care | Payer: Medicare Other | Attending: Emergency Medicine | Admitting: Emergency Medicine

## 2021-09-20 ENCOUNTER — Other Ambulatory Visit: Payer: Self-pay

## 2021-09-20 DIAGNOSIS — Z79899 Other long term (current) drug therapy: Secondary | ICD-10-CM | POA: Insufficient documentation

## 2021-09-20 DIAGNOSIS — I2510 Atherosclerotic heart disease of native coronary artery without angina pectoris: Secondary | ICD-10-CM | POA: Insufficient documentation

## 2021-09-20 DIAGNOSIS — I129 Hypertensive chronic kidney disease with stage 1 through stage 4 chronic kidney disease, or unspecified chronic kidney disease: Secondary | ICD-10-CM | POA: Diagnosis not present

## 2021-09-20 DIAGNOSIS — Z955 Presence of coronary angioplasty implant and graft: Secondary | ICD-10-CM | POA: Diagnosis not present

## 2021-09-20 DIAGNOSIS — R040 Epistaxis: Secondary | ICD-10-CM | POA: Diagnosis not present

## 2021-09-20 DIAGNOSIS — N1832 Chronic kidney disease, stage 3b: Secondary | ICD-10-CM | POA: Insufficient documentation

## 2021-09-20 DIAGNOSIS — F17290 Nicotine dependence, other tobacco product, uncomplicated: Secondary | ICD-10-CM | POA: Diagnosis not present

## 2021-09-20 DIAGNOSIS — R6889 Other general symptoms and signs: Secondary | ICD-10-CM | POA: Diagnosis not present

## 2021-09-20 DIAGNOSIS — Z7982 Long term (current) use of aspirin: Secondary | ICD-10-CM | POA: Diagnosis not present

## 2021-09-20 DIAGNOSIS — Z743 Need for continuous supervision: Secondary | ICD-10-CM | POA: Diagnosis not present

## 2021-09-20 DIAGNOSIS — R58 Hemorrhage, not elsewhere classified: Secondary | ICD-10-CM | POA: Diagnosis not present

## 2021-09-20 MED ORDER — OXYMETAZOLINE HCL 0.05 % NA SOLN
1.0000 | Freq: Once | NASAL | Status: AC
  Administered 2021-09-20: 23:00:00 1 via NASAL
  Filled 2021-09-20: qty 30

## 2021-09-20 MED ORDER — ACETAMINOPHEN 500 MG PO TABS
1000.0000 mg | ORAL_TABLET | Freq: Once | ORAL | Status: AC
  Administered 2021-09-20: 21:00:00 1000 mg via ORAL
  Filled 2021-09-20: qty 2

## 2021-09-20 MED ORDER — METOCLOPRAMIDE HCL 5 MG/ML IJ SOLN
10.0000 mg | Freq: Once | INTRAMUSCULAR | Status: AC
  Administered 2021-09-20: 21:00:00 10 mg via INTRAVENOUS
  Filled 2021-09-20: qty 2

## 2021-09-20 MED ORDER — DIPHENHYDRAMINE HCL 50 MG/ML IJ SOLN
50.0000 mg | Freq: Once | INTRAMUSCULAR | Status: AC
  Administered 2021-09-20: 21:00:00 50 mg via INTRAVENOUS
  Filled 2021-09-20: qty 1

## 2021-09-20 NOTE — Discharge Instructions (Addendum)
You came to the emergency room today to be evaluated for your nosebleed.  Your nosebleed required placement of a rapid Rhino.  You will need to leave this in place until he can follow-up with an ear nose and throat doctor in the outpatient setting.  It is recommended that you do not pull on or attempt to adjust the RAPID RHINO pack while it is placed. In  order to ensure optimal results blowing your nose is not recommended. You should avoid breathing in tobacco smoke or cleaning  products as they may irritate your nose. If possible, stay in humid environments or use saline nasal sprays to keep your nose moist,  allow it to heal, and help with congestion in the non-bleeding side. Do NOT take any NSAIDs such as motrin, advil, aleve, mobic, meloxicam, goodie powder, ibuprofen etc.   Call ENT to schedule removal of the packing within 2-3 days.   Return to the emergency department if:  Your nose continues to bleed after being discharged from the site of care (experienced as coughing up blood from the back of your throat)  The RAPID RHINO pack becomes loose and migrates in or out of your nose.  You feel weak or dizzy  You have trouble breathing  You experience trouble standing up Your pain level increases  You develop a fever  You begin vomiting

## 2021-09-20 NOTE — ED Provider Notes (Signed)
22:15: Assumed care of patient from [facility] pending re-assessment following Rhinorocket placement, if no continued significant bleeding plan is for discharge.   22:50: RE-EVAL: Patient resting comfortably, no additional bleeding at this time, he is requesting discharge states he is ready to go home. Discharge instructions prepared by prior provider. Discussed w/ attending Dr. Eulis Foster who has been involved in patient's care as shared visit this evening- in agreement with plan for discharge at this time.      Leafy Kindle 09/20/21 2314    Daleen Bo, MD 09/23/21 [phone]

## 2021-09-20 NOTE — ED Provider Notes (Signed)
[facility] DEPT Provider Note   CSN: [id number] Arrival date & time: 09/20/21  1839     History Chief Complaint  Patient presents with   Epistaxis    Jaime Barnes is a 70 y.o. male presents emergency department with a complaint of epistaxis.  States that he started having epistaxis from his left nare approximately 0 300 this morning.  Patient states that he is able to control bleeding at home.  Bleeding then restarted at 1900.  Bleeding has been constant since then.  Patient reports trying direct pressure with no relief of symptoms.  EMS reports that they tried Afrin x2 with no improvement in symptoms.  Patient denies any nasal trauma or blood thinner use.   Epistaxis Associated symptoms: no fever       Past Medical History:  Diagnosis Date   Barrett's esophagus 2011   Recommend repeat EGD for surveillance in 2 yrs-[facility]   Bipolar disorder ([place])    Chronic kidney disease    Cirrhosis ([place])    Diverticulosis of colon    GANGLION CYST, TENDON SHEATH 06/26/2010   Hyperlipidemia    Hypertension    Hypertriglyceridemia 01/21/2019   Impotence    Migraine    Pancreatitis    Pulmonary nodule    Pulmonary nodule seen on imaging study 02/06/2011   Followup CT in December 2014 with 20 months of stability at 4 mm.  No further imaging recommended     Patient Active Problem List   Diagnosis Date Noted   Pseudogout of wrist, right 08/20/2021   Abdominal pain 07/16/2021   Acute hip pain, right 07/11/2021   Erectile dysfunction due to arterial insufficiency 05/07/2021   Vitamin B12 deficiency anemia due to intrinsic factor deficiency 04/14/2021   Stage 3a chronic kidney disease ([place]) 04/14/2021   Subacromial bursitis of right shoulder joint 03/06/2021   Carpal tunnel syndrome, right 03/06/2021   Stage 3b chronic kidney disease ([place]) 02/27/2021   Primary osteoarthritis of right shoulder 02/27/2021   PAD (peripheral artery disease) ([place]) 09/06/2020    NSVT (nonsustained ventricular tachycardia) 09/06/2020   Hypertriglyceridemia 01/21/2019   Radiculopathy of lumbar region 08/19/2017   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 W/O OBSTRUCTION 11/26/2006   CIRRHOSIS, ALCOHOLIC 74/25/9563   PANCREATITIS, CHRONIC 11/26/2006   IMPOTENCE, ORGANIC 11/26/2006   Insomnia 11/26/2006    Past Surgical History:  Procedure Laterality Date   CARDIAC CATHETERIZATION  2005   Nonobstructive, <40%   TONSILLECTOMY Bilateral 1961       Family History  Problem Relation Age of Onset   Anemia Mother    Hypertension Daughter    Diabetes Daughter    Migraines Daughter     Social History   Tobacco Use   Smoking status: Light Smoker    Types: Cigars   Smokeless tobacco: Former    Quit date: 03/08/2017   Tobacco comments:    cigar occassionally   Vaping Use   Vaping Use: Never used  Substance Use Topics   Alcohol use: No    Comment: Last drink "30 years ago"   Drug use: Yes    Types: Marijuana    Comment: daily marijuana since teenager.     Home Medications Prior to Admission medications   Medication Sig Start Date End Date Taking? Authorizing Provider  albuterol (VENTOLIN HFA) 108 (90 Base) MCG/ACT inhaler Inhale 1-2 puffs into the lungs every 6 (six) hours as needed for wheezing or shortness of breath. 09/05/21   Janith Lima, MD  amLODipine (NORVASC) 10 MG  tablet TAKE 1 TABLET(10 MG) BY MOUTH DAILY 07/17/21   Janith Lima, MD  aspirin 81 MG chewable tablet Chew 81 mg by mouth daily.    [provider]  atorvastatin (LIPITOR) 80 MG tablet Take 1 tablet (80 mg total) by mouth daily. 07/26/21   Janith Lima, MD  colchicine 0.6 MG tablet Take 0.5 tablets (0.3 mg total) by mouth daily as needed (gout or psuedogout pain). 08/07/21   Gregor Hams, MD  gabapentin (NEURONTIN) 300 MG capsule Take 1 capsule (300 mg total) by mouth 2 (two) times daily. 07/26/21   Janith Lima, MD  LORazepam (ATIVAN) 0.5 MG tablet 1-2 tabs 30 - 60 min prior to injection. Do not drive with this medicine. 07/02/21   Gregor Hams, MD  MAGNESIUM-OXIDE 400 (240 Mg) MG tablet Take 1 tablet (400 mg total) by mouth daily. 09/13/21   Skeet Latch, MD  meloxicam (MOBIC) 15 MG tablet Take 15 mg by mouth daily. 05/25/21   [provider]  omeprazole (PRILOSEC) 20 MG capsule Take 1 capsule (20 mg total) by mouth 2 (two) times daily before a meal. 09/05/21   Janith Lima, MD  polyethylene glycol (MIRALAX / GLYCOLAX) 17 g packet Take 17 g by mouth daily. 07/16/21   Curatolo, Adam, DO  risperiDONE (RISPERDAL) 1 MG tablet TAKE 1 TABLET(1 MG) BY MOUTH AT BEDTIME 08/15/21   Janith Lima, MD  tadalafil (CIALIS) 5 MG tablet SMARTSIG:1-4 Tablet(s) By Mouth Daily PRN 06/28/21   [provider]  traMADol (ULTRAM) 50 MG tablet TAKE 1 TABLET(50 MG) BY MOUTH EVERY 12 HOURS AS NEEDED 06/12/21   Janith Lima, MD  traZODone (DESYREL) 100 MG tablet Take 0.5-1 tablets (50-100 mg total) by mouth at bedtime. 12/03/20   Just, Laurita Quint, FNP  UBRELVY 100 MG TABS TAKE 1 TABLET(100 MG) BY MOUTH 1 TIME EVERY 2 HOURS AS DIRECTED AS NEEDED. MAXIMUM 2 TABLETS IN 24 HOURS 04/16/21   Tomi Likens, Adam R, DO  valACYclovir (VALTREX) 500 MG tablet Take 1 tablet (500 mg total) by mouth daily. 09/19/21   Janith Lima, MD  verapamil (CALAN) 120 MG tablet Take by mouth daily as needed. 06/23/21    [provider]    Allergies    Other, Codeine phosphate, and Penicillins  Review of Systems   Review of Systems  Constitutional:  Negative for chills and fever.  HENT:  Positive for nosebleeds. Negative for facial swelling.   Respiratory:  Negative for shortness of breath.   Cardiovascular:  Negative for chest pain.  Skin:  Negative for pallor.  Neurological:  Negative for syncope and light-headedness.   Physical Exam Updated Vital Signs BP (!) 170/101 (BP Location: Right Arm)    Pulse (!) 106    Temp 98.1 F (36.7 C) (Oral)    Resp 18    Ht 5\' 6"  (1.676 m)    Wt 80.7 kg  SpO2 94%    BMI 28.73 kg/m   Physical Exam Vitals and nursing note reviewed.  Constitutional:      General: He is not in acute distress.    Appearance: He is not ill-appearing, toxic-appearing or diaphoretic.  HENT:     Head: Normocephalic.     Nose:     Left Nostril: Epistaxis present.     Comments: Patient noted to have epistaxis from left nare.  Unable to visualize source of bleeding    Mouth/Throat:     Mouth: Mucous membranes are moist. No injury or lacerations.     Comments: Blood noted to posterior oropharynx and patient's mouth.  No laceration or injury noted Eyes:     General: No scleral icterus.       Right eye: No discharge.        Left eye: No discharge.  Cardiovascular:     Rate and Rhythm: Normal rate.  Pulmonary:     Effort: Pulmonary effort is normal.  Skin:    General: Skin is warm and dry.  Neurological:     General: No focal deficit present.     Mental Status: He is alert.  Psychiatric:        Behavior: Behavior is cooperative.    ED Results / Procedures / Treatments   Labs (all labs ordered are listed, but only abnormal results are displayed) Labs Reviewed - No data to display  EKG None  Radiology No results found.  Procedures .Epistaxis Management  Date/Time: 09/20/2021 7:53 PM Performed by: Loni Beckwith, PA-C Authorized by: Loni Beckwith, PA-C   Consent:    Consent obtained:  Verbal   Consent given by:  Patient   Risks discussed:  Bleeding, infection, nasal injury and pain   Alternatives discussed:  No treatment and delayed treatment Universal protocol:    Procedure explained and questions answered to patient or proxy's satisfaction: yes     Patient identity confirmed:  Verbally with patient Anesthesia:    Anesthesia method:  None Procedure details:    Treatment site:  L anterior   Treatment method:  Nasal balloon   Treatment complexity:  Limited   Treatment episode: initial   Post-procedure details:    Assessment:  Bleeding decreased   Procedure completion:  Tolerated well, no immediate complications .Epistaxis Management  Date/Time: 09/20/2021 10:17 PM Performed by: Loni Beckwith, PA-C Authorized by: Loni Beckwith, PA-C   Consent:    Consent obtained:  Verbal   Consent given by:  Patient   Risks discussed:  Bleeding, infection, nasal injury and pain   Alternatives discussed:  No treatment and delayed treatment Universal protocol:    Procedure explained and questions answered to patient or proxy's satisfaction: yes     Patient identity confirmed:  Verbally with patient Procedure details:    Treatment site:  L posterior   Treatment method:  Nasal balloon   Treatment complexity:  Limited   Treatment episode: recurring   Post-procedure details:    Assessment:  Bleeding stopped   Procedure completion:  Tolerated well, no immediate complications   Medications Ordered in ED Medications - No data to display  ED Course  I have reviewed the triage vital signs and the nursing notes.  Pertinent labs & imaging results that were available during my care of the patient were reviewed by me and considered in my medical decision making (see chart for details).    MDM Rules/Calculators/A&P  Alert 70 year old male no acute stress, nontoxic-appearing.  Presents to ED  with chief complaint of epistaxis.  Patient has tried direct pressure at home.  Has tried Afrin x2 with EMS.  Attempted to have patient clear his nostrils and then applied direct pressure.  On reassessment patient has continued bleeding from left nare.  Anterior Rhino Rocket applied to left nare.  Patient had bleeding decreased.  We will plan to reevaluate  Patient complains of "migraine headache," after placement of Rhino Rocket.  Patient reports history of migraines.  Will place migraine cocktail at this time.    Repeat evaluation patient bleeding from left nare has been stopped however patient has continued minimal bleeding from right nare and down his throat.  We will plan to place posterior Rhino Rocket.  Posterior Rhino Rocket placed without incident.  All hemorrhage appears uncontrolled at this time.  If hemorrhage remains controlled and headache is improved we will plan to discharge patient with Rhino Rocket in place.  Patient will require ENT follow-up.  Patient care transferred to PA Petrucelli at the end of my shift. Patient presentation, ED course, and plan of care discussed with review of all pertinent labs and imaging. Please see his/her note for further details regarding further ED course and disposition.      Final Clinical Impression(s) / ED Diagnoses Final diagnoses:  Epistaxis    Rx / DC Orders ED Discharge Orders     None        Dyann Ruddle 09/20/21 2227    Daleen Bo, MD 09/23/21 [phone]

## 2021-09-20 NOTE — ED Provider Notes (Signed)
Emergency Medicine Provider Triage Evaluation Note  Jaime Barnes , a 70 y.o. male  was evaluated in triage.  Pt complains of epistaxis.  States that epistaxis earlier this morning at 0 300.  Patient was able to getout however began again at 1700.  Bleeding has been consistent since then.  Patient denies any nasal trauma.  EMS reports they gave Afrin x2 with minimal improvement in his bleeding.  Denies any blood thinner use.  Review of Systems  Positive: Epistaxis Negative: Lightheadedness, dizziness, syncope  Physical Exam  BP (!) 170/101 (BP Location: Right Arm)    Pulse (!) 106    Temp 98.1 F (36.7 C) (Oral)    Resp 18    Ht 5\' 6"  (1.676 m)    Wt 80.7 kg    SpO2 94%    BMI 28.73 kg/m  Gen:   Awake, no distress   Resp:  Normal effort  MSK:   Moves extremities without difficulty  Other:  Patient noted to have epistaxis from left nare.  Medical Decision Making  Medically screening exam initiated at 6:55 PM.  Appropriate orders placed.  Hicks Feick was informed that the remainder of the evaluation will be completed by another provider, this initial triage assessment does not replace that evaluation, and the importance of remaining in the ED until their evaluation is complete.  Patient goes to apply direct pressure.  If bleeding does not improve will likely need nasal packing.   Loni Beckwith, PA-C 09/20/21 1917    Daleen Bo, MD 09/23/21 [phone]

## 2021-09-20 NOTE — ED Notes (Signed)
Urine sent to lab 

## 2021-09-20 NOTE — ED Provider Notes (Signed)
°  Face-to-face evaluation   History: Here for intermittent nosebleeding for the last 24 hours.  No known trauma.  No anticoagulants.  No sinus symptoms.  He is alert and cooperative.  Physical exam: Elderly male, evaluated after placement of left anterior electively at this time.  No respiratory distress.  He is .  Medical screening examination/treatment/procedure(s) were conducted as a shared visit with non-physician practitioner(s) and myself.  I personally evaluated the patient during the encounter    Daleen Bo, MD 09/23/21 [phone]

## 2021-09-20 NOTE — ED Triage Notes (Signed)
Per EMS- patient reports a left nose bleed at 0300 today and was able to get it to stop.  AT 1730 today, it began rebleeding. EMS states a steady stream. EMS gave Afrin 2 sprays to the left nostril, helped for a short time, but is now bleeding again.

## 2021-09-21 ENCOUNTER — Encounter (HOSPITAL_COMMUNITY): Payer: Self-pay

## 2021-09-21 ENCOUNTER — Other Ambulatory Visit: Payer: Self-pay

## 2021-09-21 ENCOUNTER — Emergency Department (HOSPITAL_COMMUNITY)
Admission: EM | Admit: 2021-09-21 | Discharge: 2021-09-21 | Disposition: A | Discharge status: Home / Self Care | Payer: Medicare Other | Attending: Emergency Medicine | Admitting: Emergency Medicine

## 2021-09-21 DIAGNOSIS — R040 Epistaxis: Secondary | ICD-10-CM | POA: Insufficient documentation

## 2021-09-21 DIAGNOSIS — Z5321 Procedure and treatment not carried out due to patient leaving prior to being seen by health care provider: Secondary | ICD-10-CM | POA: Insufficient documentation

## 2021-09-21 NOTE — ED Notes (Addendum)
Before pt going to lobby, pt nose started to bleed again. Pt last use of Afrin was 76min ago.

## 2021-09-21 NOTE — ED Triage Notes (Signed)
Pt was seen last night for leftsided nose bleed. Pt reports his right side has now been bleeding off an don all day. Pt use Afrin spray and bleeding has now stopped. Pt states he was told to come back if it started bleeding again. Pt is not on blood thinners. Pt states he is "spitting up blood clots." Pt is A&Ox4. Pt has Rhinorocket to left nostril.

## 2021-09-22 ENCOUNTER — Encounter (HOSPITAL_COMMUNITY): Payer: Self-pay

## 2021-09-22 ENCOUNTER — Emergency Department (HOSPITAL_COMMUNITY)
Admission: EM | Admit: 2021-09-22 | Discharge: 2021-09-22 | Disposition: A | Discharge status: Home / Self Care | Payer: Medicare Other | Attending: Emergency Medicine | Admitting: Emergency Medicine

## 2021-09-22 DIAGNOSIS — Z7982 Long term (current) use of aspirin: Secondary | ICD-10-CM | POA: Insufficient documentation

## 2021-09-22 DIAGNOSIS — F17210 Nicotine dependence, cigarettes, uncomplicated: Secondary | ICD-10-CM | POA: Insufficient documentation

## 2021-09-22 DIAGNOSIS — D500 Iron deficiency anemia secondary to blood loss (chronic): Secondary | ICD-10-CM

## 2021-09-22 DIAGNOSIS — N183 Chronic kidney disease, stage 3 unspecified: Secondary | ICD-10-CM | POA: Diagnosis not present

## 2021-09-22 DIAGNOSIS — I2510 Atherosclerotic heart disease of native coronary artery without angina pectoris: Secondary | ICD-10-CM | POA: Diagnosis not present

## 2021-09-22 DIAGNOSIS — R040 Epistaxis: Secondary | ICD-10-CM | POA: Diagnosis not present

## 2021-09-22 DIAGNOSIS — I129 Hypertensive chronic kidney disease with stage 1 through stage 4 chronic kidney disease, or unspecified chronic kidney disease: Secondary | ICD-10-CM | POA: Insufficient documentation

## 2021-09-22 DIAGNOSIS — Z79899 Other long term (current) drug therapy: Secondary | ICD-10-CM | POA: Insufficient documentation

## 2021-09-22 DIAGNOSIS — D649 Anemia, unspecified: Secondary | ICD-10-CM | POA: Insufficient documentation

## 2021-09-22 LAB — CBC WITH DIFFERENTIAL/PLATELET
Abs Immature Granulocytes: 0.11 10*3/uL — ABNORMAL HIGH (ref 0.00–0.07)
Basophils Absolute: 0 10*3/uL (ref 0.0–0.1)
Basophils Relative: 0 %
Eosinophils Absolute: 0 10*3/uL (ref 0.0–0.5)
Eosinophils Relative: 0 %
HCT: 31.7 % — ABNORMAL LOW (ref 39.0–52.0)
Hemoglobin: 10.4 g/dL — ABNORMAL LOW (ref 13.0–17.0)
Immature Granulocytes: 1 %
Lymphocytes Relative: 16 %
Lymphs Abs: 2.4 10*3/uL (ref 0.7–4.0)
MCH: 31 pg (ref 26.0–34.0)
MCHC: 32.8 g/dL (ref 30.0–36.0)
MCV: 94.3 fL (ref 80.0–100.0)
Monocytes Absolute: 1.4 10*3/uL — ABNORMAL HIGH (ref 0.1–1.0)
Monocytes Relative: 9 %
Neutro Abs: 11.3 10*3/uL — ABNORMAL HIGH (ref 1.7–7.7)
Neutrophils Relative %: 74 %
Platelets: 219 10*3/uL (ref 150–400)
RBC: 3.36 MIL/uL — ABNORMAL LOW (ref 4.22–5.81)
RDW: 15.2 % (ref 11.5–15.5)
WBC: 15.3 10*3/uL — ABNORMAL HIGH (ref 4.0–10.5)
nRBC: 0 % (ref 0.0–0.2)

## 2021-09-22 LAB — COMPREHENSIVE METABOLIC PANEL
ALT: 17 U/L (ref 0–44)
AST: 14 U/L — ABNORMAL LOW (ref 15–41)
Albumin: 4 g/dL (ref 3.5–5.0)
Alkaline Phosphatase: 62 U/L (ref 38–126)
Anion gap: 6 (ref 5–15)
BUN: 10 mg/dL (ref 8–23)
CO2: 20 mmol/L — ABNORMAL LOW (ref 22–32)
Calcium: 8.9 mg/dL (ref 8.9–10.3)
Chloride: 111 mmol/L (ref 98–111)
Creatinine, Ser: 0.94 mg/dL (ref 0.61–1.24)
GFR, Estimated: >60 mL/min (ref >60)
Glucose, Bld: 124 mg/dL — ABNORMAL HIGH (ref 70–99)
Potassium: 3.6 mmol/L (ref 3.5–5.1)
Sodium: 137 mmol/L (ref 135–145)
Total Bilirubin: 0.5 mg/dL (ref 0.3–1.2)
Total Protein: 6.8 g/dL (ref 6.5–8.1)

## 2021-09-22 LAB — PROTIME-INR
INR: 1.1 (ref 0.8–1.2)
Prothrombin Time: 14.2 seconds (ref 11.4–15.2)

## 2021-09-22 MED ORDER — TRANEXAMIC ACID FOR EPISTAXIS
500.0000 mg | Freq: Once | TOPICAL | Status: AC
  Administered 2021-09-22: 06:00:00 500 mg via TOPICAL
  Filled 2021-09-22: qty 10

## 2021-09-22 MED ORDER — OXYMETAZOLINE HCL 0.05 % NA SOLN
1.0000 | Freq: Once | NASAL | Status: AC
  Administered 2021-09-22: 08:00:00 1 via NASAL
  Filled 2021-09-22: qty 30

## 2021-09-22 MED ORDER — SILVER NITRATE-POT NITRATE 75-25 % EX MISC
1.0000 "application " | Freq: Once | CUTANEOUS | Status: AC
  Administered 2021-09-22: 1 via TOPICAL
  Filled 2021-09-22: qty 10

## 2021-09-22 MED ORDER — DOXYCYCLINE HYCLATE 100 MG PO CAPS: 100.0000 mg | ORAL_CAPSULE | Freq: Two times a day (BID) | ORAL | 0 refills | Status: DC

## 2021-09-22 NOTE — ED Triage Notes (Signed)
Pt presents with right sided nose bleeds. Pt has Rhinorocket in left nostril. Pt currently has no bleeding to the right nostril but has experienced intermittent bleeding throughout the night. Pt used Afrin around 0440.

## 2021-09-22 NOTE — ED Provider Notes (Signed)
Patient is already by Dr. Roxanne Mins pending evaluation for nosebleed.  Patient was cauterized with silver nitrate by me.  Good response.  Will discharge   Lacretia Leigh, MD 09/22/21 [phone]

## 2021-09-22 NOTE — Consult Note (Signed)
ENT CONSULT:  Reason for Consult: Epistaxis  Referring Physician:  Dr. Zenia Resides   HPI: Jaime Barnes is an 70 y.o. male with history of hypertension, hyperlipidemia, chronic kidney disease, cirrhosis of the liver, peripheral vascular disease who presented to ED with reports of uncontrolled epistaxis.  Patient recently had packing placed on 09/21/2019 by ED, and returned today due to persistent bleeding.  Patient was treated with application of TXA, as well as chemical cautery to the left caudal septum but continues to have bilateral epistaxis.  ENT was called to evaluate.  Patient states he is currently on a daily aspirin, but denies any other blood thinning medication.  He denies recent history of nasal trauma or injury or prior history of recurrent epistaxis.   Past Medical History:  Diagnosis Date   Barrett's esophagus 2011   Recommend repeat EGD for surveillance in 2 yrs-[facility]   Bipolar disorder ([place])    Chronic kidney disease    Cirrhosis ([place])    Diverticulosis of colon    GANGLION CYST, TENDON SHEATH 06/26/2010   Hyperlipidemia    Hypertension    Hypertriglyceridemia 01/21/2019   Impotence    Migraine    Pancreatitis    Pulmonary nodule    Pulmonary nodule seen on imaging study 02/06/2011   Followup CT in December 2014 with 20 months of stability at 4 mm.  No further imaging recommended     Past Surgical History:  Procedure Laterality Date   CARDIAC CATHETERIZATION  2005   Nonobstructive, <40%   TONSILLECTOMY Bilateral 1961    Family History  Problem Relation Age of Onset   Anemia Mother    Hypertension Daughter    Diabetes Daughter    Migraines Daughter     Social History:  reports that he has been smoking cigars. He quit smokeless tobacco use about 4 years ago. He reports current drug use. Drug: Marijuana. He reports that he does not drink alcohol.  Allergies:  Allergies  Allergen Reactions   Other     Other reaction(s): rash, sweat, nausea Other reaction(s):  rash, sweat, nausea   Codeine Phosphate Other (See Comments)    REACTION: breathing difficulty   Penicillins Rash and Other (See Comments)    Break out in sweats/ Rash amoxicillin. Denies airway involvement Has patient had a PCN reaction causing immediate rash, facial/tongue/throat swelling, SOB or lightheadedness with hypotension: Yes Has patient had a PCN reaction causing severe rash involving mucus membranes or skin necrosis: Yes Has patient had a PCN reaction that required hospitalization No Has patient had a PCN reaction occurring within the last 10 years: No If all of the above answers are "NO", then may proceed with Cephalosporin u    Medications: I have reviewed the patient's current medications.  Results for orders placed or performed during the hospital encounter of 09/22/21 (from the past 48 hour(s))  Comprehensive metabolic panel     Status: Abnormal   Collection Time: 09/22/21  6:11 AM  Result Value Ref Range   Sodium 137 135 - 145 mmol/L   Potassium 3.6 3.5 - 5.1 mmol/L   Chloride 111 98 - 111 mmol/L   CO2 20 (L) 22 - 32 mmol/L   Glucose, Bld 124 (H) 70 - 99 mg/dL    Comment: Glucose reference range applies only to samples taken after fasting for at least 8 hours.   BUN 10 8 - 23 mg/dL   Creatinine, Ser 0.94 0.61 - 1.24 mg/dL   Calcium 8.9 8.9 - 10.3 mg/dL  Total Protein 6.8 6.5 - 8.1 g/dL   Albumin 4.0 3.5 - 5.0 g/dL   AST 14 (L) 15 - 41 U/L   ALT 17 0 - 44 U/L   Alkaline Phosphatase 62 38 - 126 U/L   Total Bilirubin 0.5 0.3 - 1.2 mg/dL   GFR, Estimated >60 >60 mL/min    Comment: (NOTE) Calculated using the CKD-EPI Creatinine Equation (2021)    Anion gap 6 5 - 15    Comment: Performed at [facility], [place] [address], [place], [place] [address]  CBC with Differential     Status: Abnormal   Collection Time: 09/22/21  6:11 AM  Result Value Ref Range   WBC 15.3 (H) 4.0 - 10.5 K/uL   RBC 3.36 (L) 4.22 - 5.81 MIL/uL   Hemoglobin 10.4 (L) 13.0 -  17.0 g/dL   HCT 31.7 (L) 39.0 - 52.0 %   MCV 94.3 80.0 - 100.0 fL   MCH 31.0 26.0 - 34.0 pg   MCHC 32.8 30.0 - 36.0 g/dL   RDW 15.2 11.5 - 15.5 %   Platelets 219 150 - 400 K/uL   nRBC 0.0 0.0 - 0.2 %   Neutrophils Relative % 74 %   Neutro Abs 11.3 (H) 1.7 - 7.7 K/uL   Lymphocytes Relative 16 %   Lymphs Abs 2.4 0.7 - 4.0 K/uL   Monocytes Relative 9 %   Monocytes Absolute 1.4 (H) 0.1 - 1.0 K/uL   Eosinophils Relative 0 %   Eosinophils Absolute 0.0 0.0 - 0.5 K/uL   Basophils Relative 0 %   Basophils Absolute 0.0 0.0 - 0.1 K/uL   Immature Granulocytes 1 %   Abs Immature Granulocytes 0.11 (H) 0.00 - 0.07 K/uL    Comment: Performed at [facility], [place] [address], [place], [place] [address]  Protime-INR     Status: None   Collection Time: 09/22/21  6:11 AM  Result Value Ref Range   Prothrombin Time 14.2 11.4 - 15.2 seconds   INR 1.1 0.8 - 1.2    Comment: (NOTE) INR goal varies based on device and disease states. Performed at [facility], [place] [address], [place],  [address]     No results found.  ROS:ROS  Blood pressure 138/88, pulse 93, temperature 98.1 F (36.7 C), temperature source Oral, resp. rate 16, height 5\' 6"  (1.676 m), weight 80.7 kg, SpO2 94 %.  PHYSICAL EXAM: CONSTITUTIONAL: well developed, nourished, no acute distress and alert and oriented x 3 PULMONARY/CHEST WALL: effort normal and no stridor, no stertor, no dysphonia HENT: Head : normocephalic and atraumatic Ears: Right ear:   canal normal, external ear normal and hearing normal Left ear:   canal normal, external ear normal and hearing normal Nose: Septum deviated to the left 3+, prior cautery site with no evidence of active bleeding.  No evidence of active bleeding from the right.  Mouth/Throat:  Mouth: uvula midline and no oral lesions Throat: oropharynx clear and moist Mucous membranes: normal EYES: conjunctiva normal, EOM normal and PERRL NECK: supple,  trachea normal and no thyromegaly or cervical LAD  Studies Reviewed:None  Assessment/Plan: Jaime Barnes is a 70 y/o male with recurrent left-sided epistaxis.  Patient treated with application of TXA and silver nitrate in the ER today with persistent epistaxis. -7.5 rapid Rhino placed in the left nare today with control of epistaxis.  Vaseline gauze packed around Rhino Rocket to offset pressure on alar rim. -Counseled patient to avoid nose blowing,  lifting greater than 10 pounds, and straining for the next 5 days -Patient will need to follow-up as an outpatient on Friday, 12/30 for packing removal.  Information for follow-up placed in patient's chart  Thank you for allowing me to participate in the care of this patient. Please do not hesitate to contact me with any questions or concerns.   Jason Coop, [facility] Cell: [phone]   09/22/2021, 11:16 AM

## 2021-09-22 NOTE — ED Notes (Signed)
Pt provided ice pack and placed on nose as instructed per MD.

## 2021-09-22 NOTE — ED Provider Notes (Signed)
[facility] DEPT Provider Note   CSN: [id number] Arrival date & time: 09/22/21  0439     History Chief Complaint  Patient presents with   Epistaxis    Jaime Barnes is a 70 y.o. male.  The history is provided by the patient.  Epistaxis He has history of hypertension, hyperlipidemia, chronic kidney disease, cirrhosis of the liver, peripheral vascular disease and comes in with recurrent bleeding from his nose.  He had been seen in the emergency department 2 days ago and had nasal packing.  Bleeding was initially from the left side, but since packing is been in place, he has had blood coming from both sides as well as in the back of his throat.  He states that he has lost a lot of blood.  He denies prior history of nosebleeds.  He does take daily aspirin but is not on any other antiplatelet agents or any systemic anticoagulants.   Past Medical History:  Diagnosis Date   Barrett's esophagus 2011   Recommend repeat EGD for surveillance in 2 yrs-[facility]   Bipolar disorder ([place])    Chronic kidney disease    Cirrhosis ([place])    Diverticulosis of colon    GANGLION CYST, TENDON SHEATH 06/26/2010   Hyperlipidemia    Hypertension    Hypertriglyceridemia 01/21/2019   Impotence    Migraine    Pancreatitis    Pulmonary nodule    Pulmonary nodule seen on imaging study 02/06/2011   Followup CT in December 2014 with 20 months of stability at 4 mm.  No further imaging recommended     Patient Active Problem List   Diagnosis Date Noted   Pseudogout of wrist, right 08/20/2021   Abdominal pain 07/16/2021   Acute hip pain, right 07/11/2021   Erectile dysfunction due to arterial insufficiency 05/07/2021   Vitamin B12 deficiency anemia due to intrinsic factor deficiency 04/14/2021   Stage 3a chronic kidney disease ([place]) 04/14/2021   Subacromial bursitis of right shoulder joint 03/06/2021   Carpal tunnel syndrome, right 03/06/2021   Stage 3b chronic kidney disease  ([place]) 02/27/2021   Primary osteoarthritis of right shoulder 02/27/2021   PAD (peripheral artery disease) ([place]) 09/06/2020   NSVT (nonsustained ventricular tachycardia) 09/06/2020   Hypertriglyceridemia 01/21/2019   Radiculopathy of lumbar region 08/19/2017   Exercise-induced bronchospasm 11/11/2016   Migraine without aura and without status migrainosus, not intractable 12/20/2015   Constipation 05/06/2015   Neuropathic pain 04/19/2015   Tibialis posterior tendinitis 05/03/2013   Plaque psoriasis 08/24/2012   CAD (coronary artery disease) 03/28/2010   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 W/O OBSTRUCTION 11/26/2006   CIRRHOSIS, ALCOHOLIC 76/19/5093   PANCREATITIS, CHRONIC 11/26/2006   IMPOTENCE, ORGANIC 11/26/2006   Insomnia 11/26/2006    Past Surgical History:  Procedure Laterality Date   CARDIAC CATHETERIZATION  2005   Nonobstructive, <40%   TONSILLECTOMY Bilateral 1961       Family History  Problem Relation Age of Onset   Anemia Mother    Hypertension Daughter    Diabetes Daughter    Migraines Daughter     Social History   Tobacco Use   Smoking status: Light Smoker    Types: Cigars   Smokeless tobacco: Former  Quit date: 03/08/2017   Tobacco comments:    cigar occassionally   Vaping Use   Vaping Use: Never used  Substance Use Topics   Alcohol use: No    Comment: Last drink "30 years ago"   Drug use: Yes    Types: Marijuana    Comment: daily marijuana since teenager.     Home Medications Prior to Admission medications   Medication Sig Start Date End Date Taking? Authorizing Provider  albuterol (VENTOLIN HFA) 108 (90 Base) MCG/ACT inhaler Inhale 1-2 puffs into the lungs  every 6 (six) hours as needed for wheezing or shortness of breath. 09/05/21   Janith Lima, MD  amLODipine (NORVASC) 10 MG tablet TAKE 1 TABLET(10 MG) BY MOUTH DAILY 07/17/21   Janith Lima, MD  aspirin 81 MG chewable tablet Chew 81 mg by mouth daily.    [provider]  atorvastatin (LIPITOR) 80 MG tablet Take 1 tablet (80 mg total) by mouth daily. 07/26/21   Janith Lima, MD  colchicine 0.6 MG tablet Take 0.5 tablets (0.3 mg total) by mouth daily as needed (gout or psuedogout pain). 08/07/21   Gregor Hams, MD  gabapentin (NEURONTIN) 300 MG capsule Take 1 capsule (300 mg total) by mouth 2 (two) times daily. 07/26/21   Janith Lima, MD  LORazepam (ATIVAN) 0.5 MG tablet 1-2 tabs 30 - 60 min prior to injection. Do not drive with this medicine. 07/02/21   Gregor Hams, MD  MAGNESIUM-OXIDE 400 (240 Mg) MG tablet Take 1 tablet (400 mg total) by mouth daily. 09/13/21   Skeet Latch, MD  meloxicam (MOBIC) 15 MG tablet Take 15 mg by mouth daily. 05/25/21   [provider]  omeprazole (PRILOSEC) 20 MG capsule Take 1 capsule (20 mg total) by mouth 2 (two) times daily before a meal. 09/05/21   Janith Lima, MD  polyethylene glycol (MIRALAX / GLYCOLAX) 17 g packet Take 17 g by mouth daily. 07/16/21   Curatolo, Adam, DO  risperiDONE (RISPERDAL) 1 MG tablet TAKE 1 TABLET(1 MG) BY MOUTH AT BEDTIME 08/15/21   Janith Lima, MD  tadalafil (CIALIS) 5 MG tablet SMARTSIG:1-4 Tablet(s) By Mouth Daily PRN 06/28/21   [provider]  traMADol (ULTRAM) 50 MG tablet TAKE 1 TABLET(50 MG) BY MOUTH EVERY 12 HOURS AS NEEDED 06/12/21   Janith Lima, MD  traZODone (DESYREL) 100 MG tablet Take 0.5-1 tablets (50-100 mg total) by mouth at bedtime. 12/03/20   Just, Laurita Quint, FNP  UBRELVY 100 MG TABS TAKE 1 TABLET(100 MG) BY MOUTH 1 TIME EVERY 2 HOURS AS DIRECTED AS NEEDED. MAXIMUM 2 TABLETS IN 24 HOURS 04/16/21   Tomi Likens, Adam R, DO  valACYclovir (VALTREX) 500 MG tablet Take 1 tablet (500 mg  total) by mouth daily. 09/19/21   Janith Lima, MD  verapamil (CALAN) 120 MG tablet Take by mouth daily as needed. 06/23/21   [provider]    Allergies    Other, Codeine phosphate, and Penicillins  Review of Systems   Review of Systems  HENT:  Positive for nosebleeds.   All other systems reviewed and are negative.  Physical Exam Updated Vital Signs BP (!) 145/95 (BP Location: Right Arm)    Pulse 99    Temp 98.1 F (36.7 C) (Oral)    Resp 16    Ht 5\' 6"  (1.676 m)    Wt 80.7 kg    SpO2 100%    BMI 28.73 kg/m   Physical Exam  Vitals and nursing note reviewed.  70 year old male, resting comfortably and in no acute distress. Vital signs are significant for mildly elevated blood pressure. Oxygen saturation is 100%, which is normal. Head is normocephalic and atraumatic. PERRLA, EOMI. Rapid Rhino is present in the left nostril with blood clot adherent to it.  This is removed and bleeding site is identified on the left side of the nasal septum without active bleeding.  There is no bleeding site identified in the right nostril. Neck is nontender and supple without adenopathy or JVD. Back is nontender and there is no CVA tenderness. Lungs are clear without rales, wheezes, or rhonchi. Chest is nontender. Heart has regular rate and rhythm without murmur. Abdomen is soft, flat, nontender. Extremities have no cyanosis or edema, full range of motion is present. Skin is warm and dry without rash. Neurologic: Mental status is normal, cranial nerves are intact, moves all extremities equally.  ED Results / Procedures / Treatments   Labs (all labs ordered are listed, but only abnormal results are displayed) Labs Reviewed  COMPREHENSIVE METABOLIC PANEL - Abnormal; Notable for the following components:      Result Value   CO2 20 (*)    Glucose, Bld 124 (*)    AST 14 (*)    All other components within normal limits  CBC WITH DIFFERENTIAL/PLATELET - Abnormal; Notable for the following  components:   WBC 15.3 (*)    RBC 3.36 (*)    Hemoglobin 10.4 (*)    HCT 31.7 (*)    Neutro Abs 11.3 (*)    Monocytes Absolute 1.4 (*)    Abs Immature Granulocytes 0.11 (*)    All other components within normal limits  PROTIME-INR    Procedures .Epistaxis Management  Date/Time: 09/22/2021 6:17 AM Performed by: Delora Fuel, MD Authorized by: Delora Fuel, MD   Consent:    Consent obtained:  Verbal   Consent given by:  Patient   Risks, benefits, and alternatives were discussed: yes     Risks discussed:  Bleeding and pain   Alternatives discussed:  Alternative treatment Universal protocol:    Procedure explained and questions answered to patient or proxy's satisfaction: yes     Relevant documents present and verified: yes     Required blood products, implants, devices, and special equipment available: yes     Site/side marked: yes     Immediately prior to procedure, a time out was called: yes     Patient identity confirmed:  Verbally with patient and arm band Anesthesia:    Anesthesia method:  None Procedure details:    Treatment site:  L anterior   Treatment method:  Anterior pack (with cotton soaked with tranexamic acid)   Treatment complexity:  Limited   Treatment episode: recurring   Post-procedure details:    Assessment:  Bleeding stopped   Procedure completion:  Tolerated well, no immediate complications   Medications Ordered in ED Medications  tranexamic acid (CYKLOKAPRON) 1000 MG/10ML topical solution 500 mg (has no administration in time range)    ED Course  I have reviewed the triage vital signs and the nursing notes.  Pertinent lab results that were available during my care of the patient were reviewed by me and considered in my medical decision making (see chart for details).   MDM Rules/Calculators/A&P                         Left-sided anterior epistaxis which has failed to  respond to nasal packing.  Old records are reviewed confirming recent ED  visit with treatment with Rapid Rhino.  Given his history of cirrhosis, will check platelet count and INR as well as a hemoglobin to see if he has had significant blood loss.  We will try topical tranexamic acid.  Hemoglobin has dropped compared with 2 months ago, but patient is hemodynamically stable.  Metabolic panel is unremarkable and INR is normal.  He is being observed in the ED to make sure he is not having a recurrence of bleeding.  If none, will discharged to follow-up with ENT.  Case is signed out to Dr. Zenia Resides.  Final Clinical Impression(s) / ED Diagnoses Final diagnoses:  Left-sided epistaxis  Anemia due to blood loss    Rx / DC Orders ED Discharge Orders     None        Delora Fuel, MD 45/73/34 [phone]

## 2021-09-22 NOTE — Discharge Instructions (Signed)
Return if bleeding is not able to be controlled adequately at home.

## 2021-09-27 ENCOUNTER — Other Ambulatory Visit: Payer: Self-pay

## 2021-09-27 ENCOUNTER — Ambulatory Visit (INDEPENDENT_AMBULATORY_CARE_PROVIDER_SITE_OTHER): Payer: Medicare Other

## 2021-09-27 DIAGNOSIS — D510 Vitamin B12 deficiency anemia due to intrinsic factor deficiency: Secondary | ICD-10-CM | POA: Diagnosis not present

## 2021-09-27 DIAGNOSIS — R040 Epistaxis: Secondary | ICD-10-CM | POA: Diagnosis not present

## 2021-09-27 DIAGNOSIS — J342 Deviated nasal septum: Secondary | ICD-10-CM | POA: Diagnosis not present

## 2021-09-27 MED ORDER — CYANOCOBALAMIN 1000 MCG/ML IJ SOLN
1000.0000 ug | Freq: Once | INTRAMUSCULAR | Status: AC
Start: 2021-09-27 — End: 2021-09-27
  Administered 2021-09-27: 09:00:00 1000 ug via INTRAMUSCULAR

## 2021-09-27 NOTE — Progress Notes (Signed)
Pt here for monthly B12 injection per Dr. Jones  B12 1000mcg given IM, and pt tolerated injection well.   

## 2021-10-01 ENCOUNTER — Other Ambulatory Visit: Payer: Self-pay | Admitting: Neurology

## 2021-10-02 ENCOUNTER — Ambulatory Visit: Payer: Medicare Other | Admitting: Podiatry

## 2021-10-02 ENCOUNTER — Other Ambulatory Visit: Payer: Self-pay | Admitting: Neurology

## 2021-10-02 DIAGNOSIS — R040 Epistaxis: Secondary | ICD-10-CM | POA: Diagnosis not present

## 2021-10-02 DIAGNOSIS — J342 Deviated nasal septum: Secondary | ICD-10-CM | POA: Diagnosis not present

## 2021-10-04 ENCOUNTER — Encounter: Payer: Self-pay | Admitting: Podiatry

## 2021-10-04 ENCOUNTER — Other Ambulatory Visit: Payer: Self-pay

## 2021-10-04 ENCOUNTER — Ambulatory Visit (INDEPENDENT_AMBULATORY_CARE_PROVIDER_SITE_OTHER): Payer: Commercial Managed Care - HMO | Admitting: Podiatry

## 2021-10-04 DIAGNOSIS — N183 Chronic kidney disease, stage 3 unspecified: Secondary | ICD-10-CM

## 2021-10-04 DIAGNOSIS — B351 Tinea unguium: Secondary | ICD-10-CM

## 2021-10-04 DIAGNOSIS — I739 Peripheral vascular disease, unspecified: Secondary | ICD-10-CM

## 2021-10-04 DIAGNOSIS — M79674 Pain in right toe(s): Secondary | ICD-10-CM

## 2021-10-04 DIAGNOSIS — M79675 Pain in left toe(s): Secondary | ICD-10-CM | POA: Diagnosis not present

## 2021-10-04 NOTE — Progress Notes (Signed)
This patient returns to my office for at risk foot care.  This patient requires this care by a professional since this patient will be at risk due to having chronic kidney disease  This patient is unable to cut nails himself since the patient cannot reach his nails.These nails are painful walking and wearing shoes.  This patient presents for at risk foot care today.  General Appearance  Alert, conversant and in no acute stress.  Vascular  Dorsalis pedis and posterior tibial  pulses are weakly  palpable  bilaterally.  Capillary return is within normal limits  bilaterally. Temperature is within normal limits  bilaterally.  Neurologic  Senn-Weinstein monofilament wire test within normal limits  bilaterally. Muscle power within normal limits bilaterally.  Nails Thick disfigured discolored nails with subungual debris  from hallux to fifth toes bilaterally. No evidence of bacterial infection or drainage bilaterally.  Orthopedic  No limitations of motion  feet .  No crepitus or effusions noted.  No bony pathology or digital deformities noted.  Skin  normotropic skin with no porokeratosis noted bilaterally.  No signs of infections or ulcers noted.     Onychomycosis  Pain in right toes  Pain in left toes  Consent was obtained for treatment procedures.   Mechanical debridement of nails 1-5  bilaterally performed with a nail nipper.  Filed with dremel without incident.    Return office visit   10 weeks                  Told patient to return for periodic foot care and evaluation due to potential at risk complications.   Jhostin Epps DPM  

## 2021-10-08 ENCOUNTER — Encounter: Payer: Self-pay | Admitting: Internal Medicine

## 2021-10-08 NOTE — Progress Notes (Signed)
Subjective:    Patient ID: Jaime Barnes, male    DOB: 1951/01/08, 71 y.o.   MRN: [id number]  This visit occurred during the SARS-CoV-2 public health emergency.  Safety protocols were in place, including screening questions prior to the visit, additional usage of staff PPE, and extensive cleaning of exam room while observing appropriate contact time as indicated for disinfecting solutions.    HPI The patient is here for an acute visit.   Leg and feet swelling since 10/06/21 - this is the first time this has happened. Since it started it has not improved.  This weekend he had a little more salt on his food.  No changes in medication over the past couple of months, except for a nasal spray for nose bleeds.   No change in swelling during day/ night.    Has SOB sometimes, which is chronic. Improves w/ inhaler.  He was hospitalized several months ago and at that time his lisinopril-hydrochlorothiazide was discontinued due to dehydration.  He has liver cirrhosis.  He denies any abdominal distention, pain or nausea      Medications and allergies reviewed with patient and updated if appropriate.  Patient Active Problem List   Diagnosis Date Noted   Pseudogout of wrist, right 08/20/2021   Abdominal pain 07/16/2021   Acute hip pain, right 07/11/2021   Erectile dysfunction due to arterial insufficiency 05/07/2021   Vitamin B12 deficiency anemia due to intrinsic factor deficiency 04/14/2021   Stage 3a chronic kidney disease ([place]) 04/14/2021   Subacromial bursitis of right shoulder joint 03/06/2021   Carpal tunnel syndrome, right 03/06/2021   Stage 3b chronic kidney disease ([place]) 02/27/2021   Primary osteoarthritis of right shoulder 02/27/2021   PAD (peripheral artery disease) ([place]) 09/06/2020   NSVT (nonsustained ventricular tachycardia) 09/06/2020   Hypertriglyceridemia 01/21/2019   Radiculopathy of lumbar region 08/19/2017   Exercise-induced bronchospasm 11/11/2016   Migraine  without aura and without status migrainosus, not intractable 12/20/2015   Constipation 05/06/2015   Neuropathic pain 04/19/2015   Tibialis posterior tendinitis 05/03/2013   Plaque psoriasis 08/24/2012   CAD (coronary artery disease) 03/28/2010   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 W/O OBSTRUCTION 11/26/2006   CIRRHOSIS, ALCOHOLIC 29/52/8413   PANCREATITIS, CHRONIC 11/26/2006   IMPOTENCE, ORGANIC 11/26/2006   Insomnia 11/26/2006    Current Outpatient Medications on File Prior to Visit  Medication Sig Dispense Refill   albuterol (VENTOLIN HFA) 108 (90 Base) MCG/ACT inhaler Inhale 1-2 puffs into the lungs every 6 (six) hours as needed for wheezing or shortness of breath. 18 g 3   amLODipine (NORVASC) 10 MG tablet TAKE 1 TABLET(10 MG) BY MOUTH DAILY 90 tablet 3   aspirin 81 MG chewable tablet Chew 81 mg by mouth daily.     atorvastatin (LIPITOR) 80 MG tablet Take 1 tablet (80 mg total) by mouth daily. 90 tablet 1   colchicine 0.6 MG tablet Take 0.5 tablets (0.3 mg total) by mouth daily as needed (gout or psuedogout pain). 30 tablet 2   doxycycline (VIBRAMYCIN) 100 MG capsule Take 1 capsule (100 mg total) by mouth 2 (two) times daily. 6 capsule 0   gabapentin (NEURONTIN) 300 MG capsule Take 1 capsule (300 mg total) by mouth  2 (two) times daily. 180 capsule 1   lisinopril-hydrochlorothiazide (ZESTORETIC) 20-12.5 MG tablet Take 1 tablet by mouth daily.     LORazepam (ATIVAN) 0.5 MG tablet 1-2 tabs 30 - 60 min prior to injection. Do not drive with this medicine. 4 tablet 0   magnesium oxide (MAG-OX) 400 MG tablet      MAGNESIUM-OXIDE 400 (240 Mg) MG tablet Take 1 tablet (400 mg total) by mouth daily. 90 tablet  3   meloxicam (MOBIC) 15 MG tablet Take 15 mg by mouth daily.     omeprazole (PRILOSEC) 20 MG capsule Take 1 capsule (20 mg total) by mouth 2 (two) times daily before a meal. 180 capsule 1   polyethylene glycol (MIRALAX / GLYCOLAX) 17 g packet Take 17 g by mouth daily. 14 each 0   risperiDONE (RISPERDAL) 1 MG tablet TAKE 1 TABLET(1 MG) BY MOUTH AT BEDTIME 90 tablet 1   tadalafil (CIALIS) 5 MG tablet SMARTSIG:1-4 Tablet(s) By Mouth Daily PRN     topiramate (TOPAMAX) 100 MG tablet TAKE 1 TABLET(100 MG) BY MOUTH TWICE DAILY 60 tablet 1   traMADol (ULTRAM) 50 MG tablet TAKE 1 TABLET(50 MG) BY MOUTH EVERY 12 HOURS AS NEEDED 60 tablet 3   traZODone (DESYREL) 100 MG tablet Take 0.5-1 tablets (50-100 mg total) by mouth at bedtime. 60 tablet 1   UBRELVY 100 MG TABS TAKE 1 TABLET(100 MG) BY MOUTH 1 TIME EVERY 2 HOURS AS DIRECTED AS NEEDED. MAXIMUM 2 TABLETS IN 24 HOURS 16 tablet 5   valACYclovir (VALTREX) 500 MG tablet Take 1 tablet (500 mg total) by mouth daily. 90 tablet 1   verapamil (CALAN) 120 MG tablet Take by mouth daily as needed.     ketorolac (ACULAR) 0.4 % SOLN Place 1 drop into the left eye 4 (four) times daily.     ofloxacin (OCUFLOX) 0.3 % ophthalmic solution Place 1 drop into the left eye 4 (four) times daily.     prednisoLONE acetate (PRED FORTE) 1 % ophthalmic suspension Place 1 drop into the left eye 4 (four) times daily.     No current facility-administered medications on file prior to visit.    Past Medical History:  Diagnosis Date   Barrett's esophagus 2011   Recommend repeat EGD for surveillance in 2 yrs-[facility]   Bipolar disorder ([place])    Chronic kidney disease    Cirrhosis ([place])    Diverticulosis of colon    GANGLION CYST, TENDON SHEATH 06/26/2010   Hyperlipidemia    Hypertension    Hypertriglyceridemia 01/21/2019   Impotence    Migraine    Pancreatitis    Pulmonary nodule    Pulmonary nodule seen on imaging study 02/06/2011   Followup CT in December 2014 with 20  months of stability at 4 mm.  No further imaging recommended     Past Surgical History:  Procedure Laterality Date   CARDIAC CATHETERIZATION  2005   Nonobstructive, <40%   TONSILLECTOMY Bilateral 1961    Social History   Socioeconomic History   Marital status: Legally Separated    Spouse name: Not on file   Number of children: 8   Years of education: 8   Highest education level: 8th grade  Occupational History   Not on file  Tobacco Use   Smoking status: Light Smoker    Types: Cigars   Smokeless tobacco: Former    Quit date: 03/08/2017   Tobacco comments:    cigar occassionally   Vaping Use   Vaping  Use: Never used  Substance and Sexual Activity   Alcohol use: No    Comment: Last drink "30 years ago"   Drug use: Yes    Types: Marijuana    Comment: daily marijuana since teenager.    Sexual activity: Yes    Birth control/protection: Condom    Comment: Multiple sex partners. 60 in last year.  Other Topics Concern   Not on file  Social History Narrative   Pt lives alone. No stairs in home. Pt has completed 8th grade.       Caffeine 3 cups coffee day      Exercise - yes walks and plays tennis   Social Determinants of Health   Financial Resource Strain: Low Risk    Difficulty of Paying Living Expenses: Not hard at all  Food Insecurity: No Food Insecurity   Worried About Charity fundraiser in the Last Year: Never true   Ran Out of Food in the Last Year: Never true  Transportation Needs: No Transportation Needs   Lack of Transportation (Medical): No   Lack of Transportation (Non-Medical): No  Physical Activity: Sufficiently Active   Days of Exercise per Week: 5 days   Minutes of Exercise per Session: 30 min  Stress: No Stress Concern Present   Feeling of Stress : Not at all  Social Connections: Moderately Integrated   Frequency of Communication with Friends and Family: More than three times a week   Frequency of Social Gatherings with Friends and Family: More  than three times a week   Attends Religious Services: 1 to 4 times per year   Active Member of [employer] or Organizations: Yes   Attends Archivist Meetings: Never   Marital Status: Separated    Family History  Problem Relation Age of Onset   Anemia Mother    Hypertension Daughter    Diabetes Daughter    Migraines Daughter     Review of Systems  Constitutional:  Negative for chills and fever.  Respiratory:  Positive for shortness of breath (sometimes). Negative for cough and wheezing.   Cardiovascular:  Positive for leg swelling. Negative for chest pain and palpitations.  Gastrointestinal:  Negative for abdominal distention, abdominal pain, constipation, diarrhea and nausea.       GERD occ  Neurological:  Negative for dizziness, light-headedness and headaches.      Objective:   Vitals:   10/09/21 1000  BP: 140/72  Pulse: 90  Temp: 98.2 F (36.8 C)  SpO2: 99%   BP Readings from Last 3 Encounters:  10/09/21 140/72  09/22/21 138/88  09/21/21 (!) 151/99   Wt Readings from Last 3 Encounters:  10/09/21 167 lb (75.8 kg)  09/22/21 178 lb (80.7 kg)  09/21/21 178 lb (80.7 kg)   Body mass index is 26.95 kg/m.   Physical Exam    Constitutional: Appears well-developed and well-nourished. No distress.  Head: Normocephalic and atraumatic.  Neck: Neck supple. No tracheal deviation present. No thyromegaly present.  No cervical lymphadenopathy Cardiovascular: Normal rate, regular rhythm and normal heart sounds.  No murmur heard. No carotid bruit .  1 + b/l pitting LE edema Pulmonary/Chest: Effort normal and breath sounds normal. No respiratory distress. No has no wheezes. No rales. Abdomen: Soft, nondistended, no fluid shift, nontender Skin: Skin is warm and dry. Not diaphoretic.  Psychiatric: Normal mood and affect. Behavior is normal.       Assessment & Plan:    Bilateral lower extremity edema: Acute Started 3 days ago  and has not improved No obvious  cause No change in medications He did consume a little bit more salt over the weekend Few months ago his hydrochlorothiazide was discontinued after hospitalization due to dehydration, but has been fine since then Ejection fraction in the past has been reduced at times-no echo recently No evidence of decompensation liver cirrhosis Restart hydrochlorothiazide 12.5 mg daily BMP next week Will order echo to reevaluate EF He is monitoring his BP at home on a regular basis  Dyspnea on exertion: Chronic Intermittent-improved with an inhaler-likely respiratory in nature Will check echo given the leg edema, but there has not been any changes in the dyspnea Following with cardiology-appointment due in May No evidence of decompensated liver cirrhosis Stressed smoking cessation

## 2021-10-09 ENCOUNTER — Other Ambulatory Visit: Payer: Self-pay

## 2021-10-09 ENCOUNTER — Ambulatory Visit (INDEPENDENT_AMBULATORY_CARE_PROVIDER_SITE_OTHER): Payer: Medicare Other | Admitting: Internal Medicine

## 2021-10-09 VITALS — BP 140/72 | HR 90 | Temp 98.2°F | Ht 66.0 in | Wt 167.0 lb

## 2021-10-09 DIAGNOSIS — R0609 Other forms of dyspnea: Secondary | ICD-10-CM | POA: Diagnosis not present

## 2021-10-09 DIAGNOSIS — R600 Localized edema: Secondary | ICD-10-CM | POA: Diagnosis not present

## 2021-10-09 MED ORDER — HYDROCHLOROTHIAZIDE 12.5 MG PO CAPS: 12.5000 mg | ORAL_CAPSULE | Freq: Every day | ORAL | 2 refills | Status: DC

## 2021-10-09 NOTE — Patient Instructions (Addendum)
°  Have blood work done next week   Medications changes include :   hydrochlorothiazide 12. 5 mg daily  Your prescription(s) have been submitted to your pharmacy. Please take as directed and contact our office if you believe you are having problem(s) with the medication(s).   A ultrasound of your heart was ordered.  Someone will call you to schedule an appointment.    Elevate your legs when sitting.  Watch your salt intake.

## 2021-10-16 ENCOUNTER — Other Ambulatory Visit (INDEPENDENT_AMBULATORY_CARE_PROVIDER_SITE_OTHER): Payer: Medicare Other

## 2021-10-16 ENCOUNTER — Other Ambulatory Visit: Payer: Self-pay

## 2021-10-16 DIAGNOSIS — R600 Localized edema: Secondary | ICD-10-CM | POA: Diagnosis not present

## 2021-10-16 DIAGNOSIS — R0609 Other forms of dyspnea: Secondary | ICD-10-CM

## 2021-10-16 LAB — BASIC METABOLIC PANEL
BUN: 8 mg/dL (ref 6–23)
CO2: 25 mEq/L (ref 19–32)
Calcium: 9.2 mg/dL (ref 8.4–10.5)
Chloride: 103 mEq/L (ref 96–112)
Creatinine, Ser: 1.21 mg/dL (ref 0.40–1.50)
GFR: 60.35 mL/min (ref >60.00)
Glucose, Bld: 85 mg/dL (ref 70–99)
Potassium: 3.9 mEq/L (ref 3.5–5.1)
Sodium: 137 mEq/L (ref 135–145)

## 2021-10-17 DIAGNOSIS — R1013 Epigastric pain: Secondary | ICD-10-CM | POA: Diagnosis not present

## 2021-10-18 ENCOUNTER — Telehealth: Payer: Self-pay | Admitting: Internal Medicine

## 2021-10-18 NOTE — Telephone Encounter (Signed)
Connected to [facility] 1.19.23.  Caller states having pain in center of chest where ribs come together. Unsure if he slept wrong. Denies trouble breathing. Reports burping.   Advised to call EMS [phone]

## 2021-10-28 ENCOUNTER — Other Ambulatory Visit: Payer: Self-pay

## 2021-10-28 ENCOUNTER — Ambulatory Visit (INDEPENDENT_AMBULATORY_CARE_PROVIDER_SITE_OTHER): Payer: Medicare Other

## 2021-10-28 DIAGNOSIS — D510 Vitamin B12 deficiency anemia due to intrinsic factor deficiency: Secondary | ICD-10-CM | POA: Diagnosis not present

## 2021-10-28 MED ORDER — CYANOCOBALAMIN 1000 MCG/ML IJ SOLN
1000.0000 ug | Freq: Once | INTRAMUSCULAR | Status: AC
  Administered 2021-10-28: 1000 ug via INTRAMUSCULAR

## 2021-10-28 NOTE — Progress Notes (Signed)
B12 given and tolerated well °

## 2021-11-01 ENCOUNTER — Other Ambulatory Visit: Payer: Self-pay | Admitting: Internal Medicine

## 2021-11-01 DIAGNOSIS — J45990 Exercise induced bronchospasm: Secondary | ICD-10-CM
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
## 2021-11-11 ENCOUNTER — Other Ambulatory Visit: Payer: Self-pay | Admitting: Gastroenterology

## 2021-11-11 ENCOUNTER — Ambulatory Visit: Payer: Medicare Other | Admitting: Neurology

## 2021-11-11 DIAGNOSIS — K7460 Unspecified cirrhosis of liver: Secondary | ICD-10-CM

## 2021-11-18 ENCOUNTER — Ambulatory Visit
Admission: RE | Admit: 2021-11-18 | Discharge: 2021-11-18 | Disposition: A | Discharge status: Home / Self Care | Payer: Medicare Other | Source: Ambulatory Visit | Attending: Gastroenterology | Admitting: Gastroenterology

## 2021-11-18 DIAGNOSIS — K7460 Unspecified cirrhosis of liver: Secondary | ICD-10-CM

## 2021-11-18 DIAGNOSIS — N281 Cyst of kidney, acquired: Secondary | ICD-10-CM | POA: Diagnosis not present

## 2021-11-18 DIAGNOSIS — I700 Atherosclerosis of aorta: Secondary | ICD-10-CM | POA: Diagnosis not present

## 2021-11-18 DIAGNOSIS — K7689 Other specified diseases of liver: Secondary | ICD-10-CM | POA: Diagnosis not present

## 2021-11-27 ENCOUNTER — Other Ambulatory Visit: Payer: Self-pay

## 2021-11-27 ENCOUNTER — Ambulatory Visit (INDEPENDENT_AMBULATORY_CARE_PROVIDER_SITE_OTHER): Payer: Medicare Other | Admitting: *Deleted

## 2021-11-27 DIAGNOSIS — D510 Vitamin B12 deficiency anemia due to intrinsic factor deficiency: Secondary | ICD-10-CM | POA: Diagnosis not present

## 2021-11-27 MED ORDER — CYANOCOBALAMIN 1000 MCG/ML IJ SOLN
1000.0000 ug | Freq: Once | INTRAMUSCULAR | Status: AC
  Administered 2021-11-27: 1000 ug via INTRAMUSCULAR

## 2021-11-27 NOTE — Progress Notes (Signed)
Patient here for b12 injection . Given in the Right Deltoid. Patient tolerated well.  ? ? ?Please co sign  ?

## 2021-12-05 ENCOUNTER — Telehealth: Payer: Self-pay | Admitting: Internal Medicine

## 2021-12-05 NOTE — Telephone Encounter (Signed)
1.Medication Requested: traMADol (ULTRAM) 50 MG tablet ?traZODone (DESYREL) 100 MG tablet ? ?2. Pharmacy (Name, [address] [place]):  ?[id number] - [place], [place] Phone:  [phone]  ?Fax:  [phone]  ?  ? ? ?3. On Med List: yes ? ?4. Last Visit with PCP: 10.18.22 ? ?5. Next visit date with PCP: 04.04.23 ? ? ?Agent: Please be advised that RX refills may take up to 3 business days. We ask that you follow-up with your pharmacy.  ?

## 2021-12-06 ENCOUNTER — Other Ambulatory Visit: Payer: Self-pay | Admitting: Internal Medicine

## 2021-12-06 DIAGNOSIS — M792 Neuralgia and neuritis, unspecified: Secondary | ICD-10-CM

## 2021-12-06 DIAGNOSIS — F5104 Psychophysiologic insomnia: Secondary | ICD-10-CM

## 2021-12-06 DIAGNOSIS — M19011 Primary osteoarthritis, right shoulder: Secondary | ICD-10-CM

## 2021-12-06 MED ORDER — TRAZODONE HCL 100 MG PO TABS: 50.0000 mg | ORAL_TABLET | Freq: Every day | ORAL | 1 refills | Status: DC

## 2021-12-06 MED ORDER — TRAMADOL HCL 50 MG PO TABS: ORAL_TABLET | ORAL | 3 refills | Status: DC

## 2021-12-25 ENCOUNTER — Ambulatory Visit (INDEPENDENT_AMBULATORY_CARE_PROVIDER_SITE_OTHER): Payer: Medicare Other | Admitting: Podiatry

## 2021-12-25 ENCOUNTER — Encounter: Payer: Self-pay | Admitting: Podiatry

## 2021-12-25 ENCOUNTER — Other Ambulatory Visit: Payer: Self-pay

## 2021-12-25 DIAGNOSIS — N183 Chronic kidney disease, stage 3 unspecified: Secondary | ICD-10-CM | POA: Diagnosis not present

## 2021-12-25 DIAGNOSIS — M79674 Pain in right toe(s): Secondary | ICD-10-CM | POA: Diagnosis not present

## 2021-12-25 DIAGNOSIS — B351 Tinea unguium: Secondary | ICD-10-CM | POA: Diagnosis not present

## 2021-12-25 DIAGNOSIS — M79675 Pain in left toe(s): Secondary | ICD-10-CM | POA: Diagnosis not present

## 2021-12-25 DIAGNOSIS — I739 Peripheral vascular disease, unspecified: Secondary | ICD-10-CM

## 2021-12-25 NOTE — Progress Notes (Signed)
This patient returns to my office for at risk foot care.  This patient requires this care by a professional since this patient will be at risk due to having chronic kidney disease  This patient is unable to cut nails himself since the patient cannot reach his nails.These nails are painful walking and wearing shoes.  This patient presents for at risk foot care today.  General Appearance  Alert, conversant and in no acute stress.  Vascular  Dorsalis pedis and posterior tibial  pulses are weakly  palpable  bilaterally.  Capillary return is within normal limits  bilaterally. Temperature is within normal limits  bilaterally.  Neurologic  Senn-Weinstein monofilament wire test within normal limits  bilaterally. Muscle power within normal limits bilaterally.  Nails Thick disfigured discolored nails with subungual debris  from hallux to fifth toes bilaterally. No evidence of bacterial infection or drainage bilaterally.  Orthopedic  No limitations of motion  feet .  No crepitus or effusions noted.  No bony pathology or digital deformities noted.  Skin  normotropic skin with no porokeratosis noted bilaterally.  No signs of infections or ulcers noted.     Onychomycosis  Pain in right toes  Pain in left toes  Consent was obtained for treatment procedures.   Mechanical debridement of nails 1-5  bilaterally performed with a nail nipper.  Filed with dremel without incident.    Return office visit   10 weeks                  Told patient to return for periodic foot care and evaluation due to potential at risk complications.   Keshan Reha DPM  

## 2021-12-30 ENCOUNTER — Ambulatory Visit: Payer: Medicare Other

## 2021-12-31 ENCOUNTER — Ambulatory Visit (INDEPENDENT_AMBULATORY_CARE_PROVIDER_SITE_OTHER): Payer: Medicare Other | Admitting: Internal Medicine

## 2021-12-31 ENCOUNTER — Encounter: Payer: Self-pay | Admitting: Internal Medicine

## 2021-12-31 VITALS — BP 122/70 | HR 69 | Temp 97.7°F | Ht 66.0 in | Wt 168.0 lb

## 2021-12-31 DIAGNOSIS — K2100 Gastro-esophageal reflux disease with esophagitis, without bleeding: Secondary | ICD-10-CM

## 2021-12-31 DIAGNOSIS — D510 Vitamin B12 deficiency anemia due to intrinsic factor deficiency: Secondary | ICD-10-CM

## 2021-12-31 DIAGNOSIS — K2270 Barrett's esophagus without dysplasia: Secondary | ICD-10-CM

## 2021-12-31 DIAGNOSIS — Z125 Encounter for screening for malignant neoplasm of prostate: Secondary | ICD-10-CM | POA: Diagnosis not present

## 2021-12-31 DIAGNOSIS — D539 Nutritional anemia, unspecified: Secondary | ICD-10-CM

## 2021-12-31 DIAGNOSIS — M792 Neuralgia and neuritis, unspecified: Secondary | ICD-10-CM | POA: Diagnosis not present

## 2021-12-31 DIAGNOSIS — D538 Other specified nutritional anemias: Secondary | ICD-10-CM | POA: Diagnosis not present

## 2021-12-31 DIAGNOSIS — N1832 Chronic kidney disease, stage 3b: Secondary | ICD-10-CM | POA: Diagnosis not present

## 2021-12-31 DIAGNOSIS — E785 Hyperlipidemia, unspecified: Secondary | ICD-10-CM | POA: Diagnosis not present

## 2021-12-31 DIAGNOSIS — I10 Essential (primary) hypertension: Secondary | ICD-10-CM

## 2021-12-31 DIAGNOSIS — D508 Other iron deficiency anemias: Secondary | ICD-10-CM

## 2021-12-31 DIAGNOSIS — E519 Thiamine deficiency, unspecified: Secondary | ICD-10-CM

## 2021-12-31 DIAGNOSIS — Z23 Encounter for immunization: Secondary | ICD-10-CM

## 2021-12-31 DIAGNOSIS — E781 Pure hyperglyceridemia: Secondary | ICD-10-CM

## 2021-12-31 DIAGNOSIS — Z0001 Encounter for general adult medical examination with abnormal findings: Secondary | ICD-10-CM

## 2021-12-31 LAB — BASIC METABOLIC PANEL
BUN: 14 mg/dL (ref 6–23)
CO2: 24 mEq/L (ref 19–32)
Calcium: 9.9 mg/dL (ref 8.4–10.5)
Chloride: 108 mEq/L (ref 96–112)
Creatinine, Ser: 1.21 mg/dL (ref 0.40–1.50)
GFR: 60.27 mL/min (ref >60.00)
Glucose, Bld: 104 mg/dL — ABNORMAL HIGH (ref 70–99)
Potassium: 4.5 mEq/L (ref 3.5–5.1)
Sodium: 140 mEq/L (ref 135–145)

## 2021-12-31 LAB — PSA: PSA: 0.99 ng/mL (ref 0.10–4.00)

## 2021-12-31 LAB — HEPATIC FUNCTION PANEL
ALT: 11 U/L (ref 0–53)
AST: 15 U/L (ref 0–37)
Albumin: 4.5 g/dL (ref 3.5–5.2)
Alkaline Phosphatase: 72 U/L (ref 39–117)
Bilirubin, Direct: 0.1 mg/dL (ref 0.0–0.3)
Total Bilirubin: 0.3 mg/dL (ref 0.2–1.2)
Total Protein: 7.2 g/dL (ref 6.0–8.3)

## 2021-12-31 LAB — URINALYSIS, ROUTINE W REFLEX MICROSCOPIC
Bilirubin Urine: NEGATIVE
Hgb urine dipstick: NEGATIVE
Ketones, ur: NEGATIVE
Leukocytes,Ua: NEGATIVE
Nitrite: NEGATIVE
RBC / HPF: NONE SEEN (ref 0–?)
Specific Gravity, Urine: 1.015 (ref 1.000–1.030)
Total Protein, Urine: NEGATIVE
Urine Glucose: NEGATIVE
Urobilinogen, UA: 0.2 (ref 0.0–1.0)
pH: 6 (ref 5.0–8.0)

## 2021-12-31 LAB — CBC WITH DIFFERENTIAL/PLATELET
Basophils Absolute: 0.1 10*3/uL (ref 0.0–0.1)
Basophils Relative: 0.7 % (ref 0.0–3.0)
Eosinophils Absolute: 0.1 10*3/uL (ref 0.0–0.7)
Eosinophils Relative: 1.7 % (ref 0.0–5.0)
HCT: 33.5 % — ABNORMAL LOW (ref 39.0–52.0)
Hemoglobin: 10.8 g/dL — ABNORMAL LOW (ref 13.0–17.0)
Lymphocytes Relative: 28.6 % (ref 12.0–46.0)
Lymphs Abs: 2.2 10*3/uL (ref 0.7–4.0)
MCHC: 32.2 g/dL (ref 30.0–36.0)
MCV: 78.8 fl (ref 78.0–100.0)
Monocytes Absolute: 0.8 10*3/uL (ref 0.1–1.0)
Monocytes Relative: 10.2 % (ref 3.0–12.0)
Neutro Abs: 4.6 10*3/uL (ref 1.4–7.7)
Neutrophils Relative %: 58.8 % (ref 43.0–77.0)
Platelets: 233 10*3/uL (ref 150.0–400.0)
RBC: 4.25 Mil/uL (ref 4.22–5.81)
RDW: 20.1 % — ABNORMAL HIGH (ref 11.5–15.5)
WBC: 7.9 10*3/uL (ref 4.0–10.5)

## 2021-12-31 LAB — LIPID PANEL
Cholesterol: 119 mg/dL (ref 0–200)
HDL: 46 mg/dL (ref >39.00)
LDL Cholesterol: 53 mg/dL (ref 0–99)
NonHDL: 73.09
Total CHOL/HDL Ratio: 3
Triglycerides: 98 mg/dL (ref 0.0–149.0)
VLDL: 19.6 mg/dL (ref 0.0–40.0)

## 2021-12-31 LAB — TSH: TSH: 3.57 u[IU]/mL (ref 0.35–5.50)

## 2021-12-31 LAB — IBC + FERRITIN
Ferritin: 7.5 ng/mL — ABNORMAL LOW (ref 22.0–322.0)
Iron: 24 ug/dL — ABNORMAL LOW (ref 42–165)
Saturation Ratios: 5.1 % — ABNORMAL LOW (ref 20.0–50.0)
TIBC: 466.2 ug/dL — ABNORMAL HIGH (ref 250.0–450.0)
Transferrin: 333 mg/dL (ref 212.0–360.0)

## 2021-12-31 LAB — FOLATE: Folate: 9.2 ng/mL (ref >5.9)

## 2021-12-31 MED ORDER — ATORVASTATIN CALCIUM 80 MG PO TABS: 80.0000 mg | ORAL_TABLET | Freq: Every day | ORAL | 1 refills | Status: DC

## 2021-12-31 MED ORDER — OMEPRAZOLE 20 MG PO CPDR: 20.0000 mg | DELAYED_RELEASE_CAPSULE | Freq: Two times a day (BID) | ORAL | 1 refills | Status: DC

## 2021-12-31 MED ORDER — CYANOCOBALAMIN 1000 MCG/ML IJ SOLN
1000.0000 ug | Freq: Once | INTRAMUSCULAR | Status: AC
  Administered 2021-12-31: 1000 ug via INTRAMUSCULAR

## 2021-12-31 MED ORDER — GABAPENTIN 300 MG PO CAPS: 300.0000 mg | ORAL_CAPSULE | Freq: Two times a day (BID) | ORAL | 1 refills | Status: DC

## 2021-12-31 NOTE — Progress Notes (Signed)
? ?Subjective:  ?Patient ID: Jaime Barnes, male    DOB: May 08, 1951  Age: 71 y.o. MRN: [id number] ? ?CC: Annual Exam, Anemia, Hypertension, and Hyperlipidemia ? ?This visit occurred during the SARS-CoV-2 public health emergency.  Safety protocols were in place, including screening questions prior to the visit, additional usage of staff PPE, and extensive cleaning of exam room while observing appropriate contact time as indicated for disinfecting solutions.   ? ?HPI ?Jaime Barnes presents for a CPX and f/up -  ? ?He is active and denies chest pain, shortness of breath, diaphoresis, dizziness, lightheadedness, or edema. ? ?Outpatient Medications Prior to Visit  ?Medication Sig Dispense Refill  ? albuterol (VENTOLIN HFA) 108 (90 Base) MCG/ACT inhaler INHALE 1 TO 2 PUFFS INTO THE LUNGS EVERY 6 HOURS AS NEEDED FOR WHEEZING OR SHORTNESS OF BREATH 18 g 3  ? amLODipine (NORVASC) 10 MG tablet TAKE 1 TABLET(10 MG) BY MOUTH DAILY 90 tablet 3  ? aspirin 81 MG chewable tablet Chew 81 mg by mouth daily.    ? colchicine 0.6 MG tablet Take 0.5 tablets (0.3 mg total) by mouth daily as needed (gout or psuedogout pain). 30 tablet 2  ? ketorolac (ACULAR) 0.4 % SOLN Place 1 drop into the left eye 4 (four) times daily.    ? LORazepam (ATIVAN) 0.5 MG tablet 1-2 tabs 30 - 60 min prior to injection. Do not drive with this medicine. 4 tablet 0  ? magnesium oxide (MAG-OX) 400 MG tablet     ? MAGNESIUM-OXIDE 400 (240 Mg) MG tablet Take 1 tablet (400 mg total) by mouth daily. 90 tablet 3  ? meloxicam (MOBIC) 15 MG tablet Take 15 mg by mouth daily.    ? ofloxacin (OCUFLOX) 0.3 % ophthalmic solution Place 1 drop into the left eye 4 (four) times daily.    ? polyethylene glycol (MIRALAX / GLYCOLAX) 17 g packet Take 17 g by mouth daily. 14 each 0  ? prednisoLONE acetate (PRED FORTE) 1 % ophthalmic suspension Place 1 drop into the left eye 4 (four) times daily.    ? risperiDONE (RISPERDAL) 1 MG tablet TAKE 1 TABLET(1 MG) BY MOUTH AT BEDTIME 90 tablet 1   ? tadalafil (CIALIS) 5 MG tablet SMARTSIG:1-4 Tablet(s) By Mouth Daily PRN    ? traMADol (ULTRAM) 50 MG tablet TAKE 1 TABLET(50 MG) BY MOUTH EVERY 12 HOURS AS NEEDED 60 tablet 3  ? traZODone (DESYREL) 100 MG tablet Take 0.5-1 tablets (50-100 mg total) by mouth at bedtime. 60 tablet 1  ? valACYclovir (VALTREX) 500 MG tablet Take 1 tablet (500 mg total) by mouth daily. 90 tablet 1  ? verapamil (CALAN) 120 MG tablet Take by mouth daily as needed.    ? atorvastatin (LIPITOR) 80 MG tablet Take 1 tablet (80 mg total) by mouth daily. 90 tablet 1  ? gabapentin (NEURONTIN) 300 MG capsule Take 1 capsule (300 mg total) by mouth 2 (two) times daily. 180 capsule 1  ? hydrochlorothiazide (MICROZIDE) 12.5 MG capsule Take 1 capsule (12.5 mg total) by mouth daily. 30 capsule 2  ? omeprazole (PRILOSEC) 20 MG capsule Take 1 capsule (20 mg total) by mouth 2 (two) times daily before a meal. 180 capsule 1  ? topiramate (TOPAMAX) 100 MG tablet TAKE 1 TABLET(100 MG) BY MOUTH TWICE DAILY 60 tablet 1  ? UBRELVY 100 MG TABS TAKE 1 TABLET(100 MG) BY MOUTH 1 TIME EVERY 2 HOURS AS DIRECTED AS NEEDED. MAXIMUM 2 TABLETS IN 24 HOURS 16 tablet 5  ? ?No facility-administered  medications prior to visit.  ? ? ?ROS ?Review of Systems  ?Constitutional: Negative.  Negative for chills, diaphoresis, fatigue and unexpected weight change.  ?HENT: Negative.    ?Eyes: Negative.   ?Respiratory:  Negative for cough, chest tightness, shortness of breath and wheezing.   ?Cardiovascular:  Negative for chest pain, palpitations and leg swelling.  ?Gastrointestinal:  Negative for abdominal pain, constipation, diarrhea, nausea and vomiting.  ?Endocrine: Negative.   ?Genitourinary: Negative.  Negative for difficulty urinating.  ?Musculoskeletal: Negative.   ?Skin: Negative.  Negative for color change.  ?Neurological:  Negative for dizziness, weakness and headaches.  ?Hematological:  Negative for adenopathy. Does not bruise/bleed easily.  ?Psychiatric/Behavioral:  Negative.    ? ?Objective:  ?BP 122/70 (BP Location: Left Arm, Patient Position: Sitting, Cuff Size: Large)   Pulse 69   Temp 97.7 ?F (36.5 ?C) (Oral)   Ht '5\' 6"'$  (1.676 m)   Wt 168 lb (76.2 kg)   SpO2 98%   BMI 27.12 kg/m?  ? ?BP Readings from Last 3 Encounters:  ?01/06/22 (!) 143/92  ?12/31/21 122/70  ?10/09/21 140/72  ? ? ?Wt Readings from Last 3 Encounters:  ?01/06/22 170 lb (77.1 kg)  ?12/31/21 168 lb (76.2 kg)  ?10/09/21 167 lb (75.8 kg)  ? ? ?Physical Exam ?Vitals reviewed.  ?Constitutional:   ?   Appearance: He is not ill-appearing.  ?HENT:  ?   Nose: Nose normal.  ?   Mouth/Throat:  ?   Mouth: Mucous membranes are moist.  ?Eyes:  ?   General: No scleral icterus. ?   Conjunctiva/sclera: Conjunctivae normal.  ?Cardiovascular:  ?   Rate and Rhythm: Normal rate and regular rhythm.  ?   Heart sounds: No murmur heard. ?Pulmonary:  ?   Effort: Pulmonary effort is normal.  ?   Breath sounds: No stridor. No wheezing, rhonchi or rales.  ?Abdominal:  ?   General: Abdomen is flat.  ?   Palpations: There is no mass.  ?   Tenderness: There is no abdominal tenderness. There is no guarding or rebound.  ?   Hernia: No hernia is present. There is no hernia in the left inguinal area or right inguinal area.  ?Genitourinary: ?   Pubic Area: No rash.   ?   Penis: Normal and circumcised.   ?   Testes: Normal.  ?   Epididymis:  ?   Right: Normal.  ?   Left: Normal.  ?   Prostate: Normal. Not enlarged, not tender and no nodules present.  ?   Rectum: Normal. Guaiac result negative. No mass, tenderness, anal fissure, external hemorrhoid or internal hemorrhoid. Normal anal tone.  ?Musculoskeletal:     ?   General: Normal range of motion.  ?   Cervical back: Neck supple.  ?   Right lower leg: No edema.  ?   Left lower leg: No edema.  ?Lymphadenopathy:  ?   Cervical: No cervical adenopathy.  ?   Lower Body: No right inguinal adenopathy. No left inguinal adenopathy.  ?Skin: ?   General: Skin is warm and dry.  ?   Coloration: Skin is  not pale.  ?Neurological:  ?   General: No focal deficit present.  ?   Mental Status: He is alert. Mental status is at baseline.  ?Psychiatric:     ?   Mood and Affect: Mood normal.     ?   Behavior: Behavior normal.  ? ? ?Lab Results  ?Component Value Date  ? WBC  7.9 12/31/2021  ? HGB 10.8 (L) 12/31/2021  ? HCT 33.5 (L) 12/31/2021  ? PLT 233.0 12/31/2021  ? GLUCOSE 104 (H) 12/31/2021  ? CHOL 119 12/31/2021  ? TRIG 98.0 12/31/2021  ? HDL 46.00 12/31/2021  ? LDLDIRECT 156 (H) 06/09/2011  ? LDLCALC 53 12/31/2021  ? ALT 11 12/31/2021  ? AST 15 12/31/2021  ? NA 140 12/31/2021  ? K 4.5 12/31/2021  ? CL 108 12/31/2021  ? CREATININE 1.21 12/31/2021  ? BUN 14 12/31/2021  ? CO2 24 12/31/2021  ? TSH 3.57 12/31/2021  ? PSA 0.99 12/31/2021  ? INR 1.1 09/22/2021  ? HGBA1C 5.8 (H) 12/03/2020  ? ? ?US Abdomen Complete ? ?Result Date: 11/18/2021 ?CLINICAL DATA:  Cirrhosis EXAM: ABDOMEN ULTRASOUND COMPLETE COMPARISON:  Previous studies including the examination of 05/14/2021 FINDINGS: Gallbladder: No gallstones or wall thickening visualized. No sonographic Murphy sign noted by sonographer. Common bile duct: Diameter: 3 mm Liver: There is inhomogeneous coarse echogenicity in the liver. There is mild nodularity in the liver surface. No focal abnormality is seen in the visualized portions of liver. Portal vein is patent on color Doppler imaging with normal direction of blood flow towards the liver. IVC: No abnormality visualized. Pancreas: Visualized portion unremarkable. Spleen: Size and appearance within normal limits. Right Kidney: Length: 11 cm. Echogenicity within normal limits. No mass or hydronephrosis visualized. Left Kidney: Length: 11.3 cm. There is no hydronephrosis. Cortical echogenicity is unremarkable. There is 1.4 cm cyst in the upper pole. Abdominal aorta: Atherosclerotic plaques are seen. Other findings: None. IMPRESSION: Coarsening of echoes and nodularity in the liver surface suggests cirrhosis. No focal abnormality  is seen in the visualized portions of liver. Left renal cyst.  Abdominal sonogram is otherwise unremarkable. Electronically Signed   By: Elmer Picker M.D.   On: 11/18/2021 16:05  ? ? ?Assessment & Plan:  ?

## 2021-12-31 NOTE — Patient Instructions (Signed)

## 2022-01-01 DIAGNOSIS — D508 Other iron deficiency anemias: Secondary | ICD-10-CM | POA: Insufficient documentation

## 2022-01-01 DIAGNOSIS — D500 Iron deficiency anemia secondary to blood loss (chronic): Secondary | ICD-10-CM | POA: Insufficient documentation

## 2022-01-01 MED ORDER — ACCRUFER 30 MG PO CAPS
1.0000 | ORAL_CAPSULE | Freq: Two times a day (BID) | ORAL | 1 refills | Status: DC
Start: 2022-01-01 — End: 2022-07-01

## 2022-01-01 NOTE — Progress Notes (Signed)
? ?NEUROLOGY FOLLOW UP OFFICE NOTE ? ?Jaime Barnes ?[id number] ? ?Assessment/Plan:  ? ?1  Migraine without aura, without status migrainosus, not intractable ?2  New onset headache - paroxysmal circumscribed left parietal headache unclear etiology ?  ?1.  For further evaluation of new headache, check CTA head to evaluate for secondary etiology such as aneurysm. ?Migraine prevention:  topiramate '100mg'$  twice daily.   ?2.  Migraine rescue:  Ubrelvy '100mg'$  ?3.  Limit use of pain relievers to no more than 2 days out of week to prevent risk of rebound or medication-overuse headache. ?4.  Keep headache diary ?5.  Follow up 6 months ? ?Subjective:  ?Jaime Barnes is a 71 year old right-handed man with hypertension, bipolar disorder, CKD, tobacco use, Barrett's esophagus and cirrhosis who follows up for migraine. ?  ?UPDATE: ?Intensity:  10/10 ?Duration:  Within 30 minutes with [name] ?Frequency:  Usually 3 times a month.  Last month had 5.   ?  ?A couple of months ago, he started getting a squeezing/throbbing pain in his left parietal region last 3-4 minutes.  Squeezing and rubbing head over it and takes a tizanidine helps ease it. They occur once a week.   ? ?Current NSAIDS:  none ?Current analgesics:  none ?Current triptans: none ?Current anti-emetic:  none ?Current muscle relaxants:  none ?Current Antihypertensive medications:  Lisinopril '10mg'$ , verapamil '120mg'$  three times daily, metoprolol ?Current Antidepressant medications:  none ?Current Anticonvulsant medications: topiramate '100mg'$  twice daily, gabapentin '300mg'$  twice daily ?Current anti-CGRP:  Ubrelvy '100mg'$  ?  ?HISTORY: ?Onset:  Has remote history of migraines but they returned in 2016 after experiencing increased stress related to his ex-girlfriend. ?Location:  Right sided ?Quality:  pounding ?Initial Intensity:  "50"/10 ?Aura:  no ?Prodrome:  no ?Associated symptoms: Photophobia, phonophobia.  No nausea or visual disturbance ?Initial Duration:  Until takes  medication ?Initial Frequency:  every other day ?Triggers:  Emotional stress ?Relieving factors: None ?Activity:  Cannot function ?  ?Past abortive medication:  ibuprofen, naproxen, Tylenol, Excedrin, sumatriptan (effective but contraindicated due to cardiac history) ?Past preventative medication:  none ?Other past therapy:  none ?  ?CT of head from 07/19/15 was unremarkable.    ?MRI of brain from 12/04/14 showed global atrophy and mild small vessel ischemic changes. ?06/23/16:  Sed rate 25 ? ?PAST MEDICAL HISTORY: ?Past Medical History:  ?Diagnosis Date  ? Barrett's esophagus 2011  ? Recommend repeat EGD for surveillance in 2 yrs-[facility]  ? Bipolar disorder ([place])   ? Chronic kidney disease   ? Cirrhosis ([place])   ? Diverticulosis of colon   ? GANGLION CYST, TENDON SHEATH 06/26/2010  ? Hyperlipidemia   ? Hypertension   ? Hypertriglyceridemia 01/21/2019  ? Impotence   ? Migraine   ? Pancreatitis   ? Pulmonary nodule   ? Pulmonary nodule seen on imaging study 02/06/2011  ? Followup CT in December 2014 with 20 months of stability at 4 mm.  No further imaging recommended   ? ? ?MEDICATIONS: ?Current Outpatient Medications on File Prior to Visit  ?Medication Sig Dispense Refill  ? albuterol (VENTOLIN HFA) 108 (90 Base) MCG/ACT inhaler INHALE 1 TO 2 PUFFS INTO THE LUNGS EVERY 6 HOURS AS NEEDED FOR WHEEZING OR SHORTNESS OF BREATH 18 g 3  ? amLODipine (NORVASC) 10 MG tablet TAKE 1 TABLET(10 MG) BY MOUTH DAILY 90 tablet 3  ? aspirin 81 MG chewable tablet Chew 81 mg by mouth daily.    ? atorvastatin (LIPITOR) 80 MG tablet Take 1 tablet (80 mg total)  by mouth daily. 90 tablet 1  ? colchicine 0.6 MG tablet Take 0.5 tablets (0.3 mg total) by mouth daily as needed (gout or psuedogout pain). 30 tablet 2  ? Ferric Maltol (ACCRUFER) 30 MG CAPS Take 1 capsule by mouth in the morning and at bedtime. 180 capsule 1  ? gabapentin (NEURONTIN) 300 MG capsule Take 1 capsule (300 mg total) by mouth 2 (two) times daily. 180 capsule 1  ?  hydrochlorothiazide (MICROZIDE) 12.5 MG capsule Take 1 capsule (12.5 mg total) by mouth daily. 30 capsule 2  ? ketorolac (ACULAR) 0.4 % SOLN Place 1 drop into the left eye 4 (four) times daily.    ? LORazepam (ATIVAN) 0.5 MG tablet 1-2 tabs 30 - 60 min prior to injection. Do not drive with this medicine. 4 tablet 0  ? magnesium oxide (MAG-OX) 400 MG tablet     ? MAGNESIUM-OXIDE 400 (240 Mg) MG tablet Take 1 tablet (400 mg total) by mouth daily. 90 tablet 3  ? meloxicam (MOBIC) 15 MG tablet Take 15 mg by mouth daily.    ? ofloxacin (OCUFLOX) 0.3 % ophthalmic solution Place 1 drop into the left eye 4 (four) times daily.    ? omeprazole (PRILOSEC) 20 MG capsule Take 1 capsule (20 mg total) by mouth 2 (two) times daily before a meal. 180 capsule 1  ? polyethylene glycol (MIRALAX / GLYCOLAX) 17 g packet Take 17 g by mouth daily. 14 each 0  ? prednisoLONE acetate (PRED FORTE) 1 % ophthalmic suspension Place 1 drop into the left eye 4 (four) times daily.    ? risperiDONE (RISPERDAL) 1 MG tablet TAKE 1 TABLET(1 MG) BY MOUTH AT BEDTIME 90 tablet 1  ? tadalafil (CIALIS) 5 MG tablet SMARTSIG:1-4 Tablet(s) By Mouth Daily PRN    ? topiramate (TOPAMAX) 100 MG tablet TAKE 1 TABLET(100 MG) BY MOUTH TWICE DAILY 60 tablet 1  ? traMADol (ULTRAM) 50 MG tablet TAKE 1 TABLET(50 MG) BY MOUTH EVERY 12 HOURS AS NEEDED 60 tablet 3  ? traZODone (DESYREL) 100 MG tablet Take 0.5-1 tablets (50-100 mg total) by mouth at bedtime. 60 tablet 1  ? UBRELVY 100 MG TABS TAKE 1 TABLET(100 MG) BY MOUTH 1 TIME EVERY 2 HOURS AS DIRECTED AS NEEDED. MAXIMUM 2 TABLETS IN 24 HOURS 16 tablet 5  ? valACYclovir (VALTREX) 500 MG tablet Take 1 tablet (500 mg total) by mouth daily. 90 tablet 1  ? verapamil (CALAN) 120 MG tablet Take by mouth daily as needed.    ? ?No current facility-administered medications on file prior to visit.  ? ? ?ALLERGIES: ?Allergies  ?Allergen Reactions  ? Other   ?  Other reaction(s): rash, sweat, nausea ?Other reaction(s): rash, sweat,  nausea  ? Codeine Phosphate Other (See Comments)  ?  REACTION: breathing difficulty  ? Penicillins Rash and Other (See Comments)  ?  Break out in sweats/ Rash amoxicillin. Denies airway involvement ?Has patient had a PCN reaction causing immediate rash, facial/tongue/throat swelling, SOB or lightheadedness with hypotension: Yes ?Has patient had a PCN reaction causing severe rash involving mucus membranes or skin necrosis: Yes Has patient had a PCN reaction that required hospitalization No ?Has patient had a PCN reaction occurring within the last 10 years: No ?If all of the above answers are "NO", then may proceed with Cephalosporin u  ? ? ?FAMILY HISTORY: ?Family History  ?Problem Relation Age of Onset  ? Anemia Mother   ? Hypertension Daughter   ? Diabetes Daughter   ? Migraines  Daughter   ? ? ?  ?Objective:  ?Blood pressure (!) 143/92, pulse 93, height '5\' 6"'$  (1.676 m), weight 170 lb (77.1 kg), SpO2 99 %. ?General: No acute distress.  Patient appears well-groomed.   ?Head:  Normocephalic/atraumatic ?Eyes:  Fundi examined but not visualized ?Back: No paraspinal tenderness ?Neurological Exam: alert and oriented to person, place, and time.  Speech fluent and not dysarthric, language intact.  CN II-XII intact. Bulk and tone normal, muscle strength 5/5 throughout.  Sensation to light touch intact.  Deep tendon reflexes 2+ throughout, toes downgoing.  Finger to nose testing intact.  Gait normal, Romberg negative. ? ? ?Metta Clines, DO ? ?CC: Scarlette Calico, MD ? ? ? ? ? ? ?

## 2022-01-02 DIAGNOSIS — H10413 Chronic giant papillary conjunctivitis, bilateral: Secondary | ICD-10-CM | POA: Diagnosis not present

## 2022-01-02 DIAGNOSIS — H04123 Dry eye syndrome of bilateral lacrimal glands: Secondary | ICD-10-CM | POA: Diagnosis not present

## 2022-01-02 DIAGNOSIS — H25813 Combined forms of age-related cataract, bilateral: Secondary | ICD-10-CM | POA: Diagnosis not present

## 2022-01-02 DIAGNOSIS — H21233 Degeneration of iris (pigmentary), bilateral: Secondary | ICD-10-CM | POA: Diagnosis not present

## 2022-01-03 ENCOUNTER — Encounter: Payer: Self-pay | Admitting: Internal Medicine

## 2022-01-06 ENCOUNTER — Encounter: Payer: Self-pay | Admitting: Neurology

## 2022-01-06 ENCOUNTER — Ambulatory Visit (INDEPENDENT_AMBULATORY_CARE_PROVIDER_SITE_OTHER): Payer: Medicare Other | Admitting: Neurology

## 2022-01-06 VITALS — BP 143/92 | HR 93 | Ht 66.0 in | Wt 170.0 lb

## 2022-01-06 DIAGNOSIS — G43009 Migraine without aura, not intractable, without status migrainosus: Secondary | ICD-10-CM | POA: Diagnosis not present

## 2022-01-06 DIAGNOSIS — D538 Other specified nutritional anemias: Secondary | ICD-10-CM | POA: Insufficient documentation

## 2022-01-06 DIAGNOSIS — R519 Headache, unspecified: Secondary | ICD-10-CM

## 2022-01-06 LAB — VITAMIN B1: Vitamin B1 (Thiamine): 8 nmol/L (ref 8–30)

## 2022-01-06 MED ORDER — UBRELVY 100 MG PO TABS: ORAL_TABLET | ORAL | 5 refills | Status: DC

## 2022-01-06 MED ORDER — THIAMINE HCL 100 MG PO TABS: 100.0000 mg | ORAL_TABLET | Freq: Every day | ORAL | 1 refills | Status: DC

## 2022-01-06 NOTE — Patient Instructions (Addendum)
Continue topiramate '100mg'$  twice daily and ?Use Ubrelvy '100mg'$  as needed/directed ?Check CTA of head ?Limit use of pain relievers to no more than 2 days out of week to prevent risk of rebound or medication-overuse headache. ?Keep headache diary ?Follow up 6 months. ?

## 2022-01-07 ENCOUNTER — Ambulatory Visit
Admission: RE | Admit: 2022-01-07 | Discharge: 2022-01-07 | Disposition: A | Discharge status: Home / Self Care | Payer: Medicare Other | Source: Ambulatory Visit | Attending: Neurology | Admitting: Neurology

## 2022-01-07 DIAGNOSIS — I6523 Occlusion and stenosis of bilateral carotid arteries: Secondary | ICD-10-CM | POA: Diagnosis not present

## 2022-01-07 DIAGNOSIS — R519 Headache, unspecified: Secondary | ICD-10-CM

## 2022-01-07 MED ORDER — IOPAMIDOL (ISOVUE-370) INJECTION 76%
75.0000 mL | Freq: Once | INTRAVENOUS | Status: AC | PRN
  Administered 2022-01-07: 75 mL via INTRAVENOUS

## 2022-01-08 ENCOUNTER — Other Ambulatory Visit: Payer: Self-pay | Admitting: Neurology

## 2022-01-09 NOTE — Progress Notes (Signed)
Pt advised of his CT results.

## 2022-01-13 ENCOUNTER — Other Ambulatory Visit: Payer: Self-pay | Admitting: Internal Medicine

## 2022-01-13 MED ORDER — BOOSTRIX 5-2.5-18.5 LF-MCG/0.5 IM SUSP: 0.5000 mL | Freq: Once | INTRAMUSCULAR | 0 refills | Status: AC

## 2022-01-13 MED ORDER — SHINGRIX 50 MCG/0.5ML IM SUSR: 0.5000 mL | Freq: Once | INTRAMUSCULAR | 1 refills | Status: DC

## 2022-01-13 MED ORDER — HYDROCHLOROTHIAZIDE 12.5 MG PO CAPS: 12.5000 mg | ORAL_CAPSULE | Freq: Every day | ORAL | 1 refills | Status: DC

## 2022-01-24 DIAGNOSIS — H25812 Combined forms of age-related cataract, left eye: Secondary | ICD-10-CM | POA: Diagnosis not present

## 2022-01-31 ENCOUNTER — Ambulatory Visit (INDEPENDENT_AMBULATORY_CARE_PROVIDER_SITE_OTHER): Payer: Medicare Other

## 2022-01-31 DIAGNOSIS — D510 Vitamin B12 deficiency anemia due to intrinsic factor deficiency: Secondary | ICD-10-CM

## 2022-01-31 MED ORDER — CYANOCOBALAMIN 1000 MCG/ML IJ SOLN
1000.0000 ug | Freq: Once | INTRAMUSCULAR | Status: AC
  Administered 2022-01-31: 1000 ug via INTRAMUSCULAR

## 2022-01-31 NOTE — Progress Notes (Signed)
B12 given Please cosign 

## 2022-02-06 ENCOUNTER — Ambulatory Visit (INDEPENDENT_AMBULATORY_CARE_PROVIDER_SITE_OTHER): Payer: Medicare Other | Admitting: Cardiovascular Disease

## 2022-02-06 ENCOUNTER — Encounter (HOSPITAL_BASED_OUTPATIENT_CLINIC_OR_DEPARTMENT_OTHER): Payer: Self-pay | Admitting: Cardiovascular Disease

## 2022-02-06 VITALS — BP 118/76 | HR 61 | Ht 66.0 in | Wt 172.8 lb

## 2022-02-06 DIAGNOSIS — E785 Hyperlipidemia, unspecified: Secondary | ICD-10-CM

## 2022-02-06 DIAGNOSIS — I2510 Atherosclerotic heart disease of native coronary artery without angina pectoris: Secondary | ICD-10-CM

## 2022-02-06 DIAGNOSIS — F17200 Nicotine dependence, unspecified, uncomplicated: Secondary | ICD-10-CM | POA: Diagnosis not present

## 2022-02-06 DIAGNOSIS — I4729 Other ventricular tachycardia: Secondary | ICD-10-CM | POA: Diagnosis not present

## 2022-02-06 DIAGNOSIS — I10 Essential (primary) hypertension: Secondary | ICD-10-CM

## 2022-02-06 NOTE — Progress Notes (Signed)
? ? ?Cardiology Office Note ? ? ?Date:  02/06/2022  ? ?ID:  Jaime Barnes, DOB [birth year], MRN [id number] ? ?PCP:  Janith Lima, MD  ?Cardiologist:   Skeet Latch, MD  ?Cardiology APP: Almyra Deforest, PA ? ?No chief complaint on file. ? ? ?History of Present Illness: ?Jaime Barnes is a 71 y.o. male with hypertension, hyperlipidemia non-obstructive CAD, moderate PAD, CKD 3, tobacco abuse, and mild carotid stenosis who presents for follow up.  Jaime Barnes was evaluated by his PCP's office on 04/2015 with symptoms concerning for claudication.  He reported one month of leg cramping and pain when walking up an incline. He had no pain when walking down hill or on flat land.  He underwent ABI testing that was negative for occlusive disease.  He was referred to cardiology due to suspicion that this was a false negative.  He was referred for exercise ABIs that were normal but Doppler showed 30-49% SFA disease without focal stenosis.  The L peroneal was occluded.  Jaime Barnes had a [place] 06/2004 that revealed 40% LAD, [name]  LVEF was 50%. Repeat ABIs 06/2019 were unchanged.   ?  ?HCTZ-lisinopril was switched to amlodipine 2/2 worsening renal function.  He followed up with Almyra Deforest, PA on 04/2017 and was doing well.  He again saw Jaime Barnes 04/2018 for dizziness.  He was referred for carotid Dopplers that revealed mild stenosis bilaterally.  He also had a 30-day event monitor that showed sinus rhythm with occasional PACs and PVCs.  He also had a 5 beat run of NSVT.  [name] was referred for a Lexiscan Myoview 06/2018 that revealed LVEF 47% and no ischemia.  Jaime Barnes was referred for repeat ABIs 06/2018 which were normal.  He had a screening abdominal aorta ultrasound that revealed 50-74% of the right common iliac artery. His abdominal aorta was not dilated.  He has been bradycardic but was asymptomatic.  Verapamil was stopped and lisinopril was increased.  At his last appointment he had stopped smoking cigarettes but was  still smoking cigars. ? ?He joined the [employer].  He hasn't started going yet but wants to participate in the PREP program.  He has been cutting back on his cigars.  He no longer smokes cigarettes.  He now has 1 cigar once a week.  He thinks he is almost ready to quit.  He has not had any chest pain or shortness of breath.  He notes that when he walks he starts to get some aches in his upper thighs.  He has no lower extremity edema, orthopnea, or PND.  His blood pressures at home have been well controlled. ? ?Past Medical History:  ?Diagnosis Date  ? Barrett's esophagus 2011  ? Recommend repeat EGD for surveillance in 2 yrs-[facility]  ? Bipolar disorder ([place])   ? Chronic kidney disease   ? Cirrhosis ([place])   ? Diverticulosis of colon   ? GANGLION CYST, TENDON SHEATH 06/26/2010  ? Hyperlipidemia   ? Hypertension   ? Hypertriglyceridemia 01/21/2019  ? Impotence   ? Migraine   ? Pancreatitis   ? Pulmonary nodule   ? Pulmonary nodule seen on imaging study 02/06/2011  ? Followup CT in December 2014 with 20 months of stability at 4 mm.  No further imaging recommended   ? ? ?Past Surgical History:  ?Procedure Laterality Date  ? CARDIAC CATHETERIZATION  2005  ? Nonobstructive, <40%  ? TONSILLECTOMY Bilateral 1961  ? ? ? ?Current Outpatient  Medications  ?Medication Sig Dispense Refill  ? albuterol (VENTOLIN HFA) 108 (90 Base) MCG/ACT inhaler INHALE 1 TO 2 PUFFS INTO THE LUNGS EVERY 6 HOURS AS NEEDED FOR WHEEZING OR SHORTNESS OF BREATH 18 g 3  ? amLODipine (NORVASC) 10 MG tablet TAKE 1 TABLET(10 MG) BY MOUTH DAILY 90 tablet 3  ? aspirin 81 MG chewable tablet Chew 81 mg by mouth daily.    ? atorvastatin (LIPITOR) 80 MG tablet Take 1 tablet (80 mg total) by mouth daily. 90 tablet 1  ? colchicine 0.6 MG tablet Take 0.5 tablets (0.3 mg total) by mouth daily as needed (gout or psuedogout pain). 30 tablet 2  ? Ferric Maltol (ACCRUFER) 30 MG CAPS Take 1 capsule by mouth in the morning and at bedtime. 180 capsule 1  ? gabapentin (NEURONTIN)  300 MG capsule Take 1 capsule (300 mg total) by mouth 2 (two) times daily. 180 capsule 1  ? ketorolac (ACULAR) 0.4 % SOLN Place 1 drop into the left eye 4 (four) times daily.    ? LORazepam (ATIVAN) 0.5 MG tablet 1-2 tabs 30 - 60 min prior to injection. Do not drive with this medicine. 4 tablet 0  ? MAGNESIUM-OXIDE 400 (240 Mg) MG tablet Take 1 tablet (400 mg total) by mouth daily. 90 tablet 3  ? meloxicam (MOBIC) 15 MG tablet Take 15 mg by mouth daily.    ? metoprolol succinate (TOPROL-XL) 25 MG 24 hr tablet Take 1 tablet by mouth daily at 12 noon.    ? ofloxacin (OCUFLOX) 0.3 % ophthalmic solution Place 1 drop into the left eye 4 (four) times daily.    ? omeprazole (PRILOSEC) 20 MG capsule Take 1 capsule (20 mg total) by mouth 2 (two) times daily before a meal. 180 capsule 1  ? polyethylene glycol (MIRALAX / GLYCOLAX) 17 g packet Take 17 g by mouth daily. 14 each 0  ? prednisoLONE acetate (PRED FORTE) 1 % ophthalmic suspension Place 1 drop into the left eye 4 (four) times daily.    ? risperiDONE (RISPERDAL) 1 MG tablet TAKE 1 TABLET(1 MG) BY MOUTH AT BEDTIME 90 tablet 1  ? tadalafil (CIALIS) 5 MG tablet SMARTSIG:1-4 Tablet(s) By Mouth Daily PRN    ? thiamine 100 MG tablet Take 1 tablet (100 mg total) by mouth daily. 90 tablet 1  ? traMADol (ULTRAM) 50 MG tablet TAKE 1 TABLET(50 MG) BY MOUTH EVERY 12 HOURS AS NEEDED 60 tablet 3  ? traZODone (DESYREL) 100 MG tablet Take 0.5-1 tablets (50-100 mg total) by mouth at bedtime. 60 tablet 1  ? Ubrogepant (UBRELVY) 100 MG TABS TAKE 1 TABLET(100 MG) BY MOUTH 1 TIME EVERY 2 HOURS AS DIRECTED AS NEEDED. MAXIMUM 2 TABLETS IN 24 HOURS 16 tablet 5  ? valACYclovir (VALTREX) 500 MG tablet Take 1 tablet (500 mg total) by mouth daily. 90 tablet 1  ? verapamil (CALAN) 120 MG tablet Take by mouth daily as needed.    ? ?No current facility-administered medications for this visit.  ? ? ?Allergies:   Other, Codeine phosphate, and Penicillins  ? ? ?Social History:  The patient  reports  that he has been smoking cigars. He quit smokeless tobacco use about 4 years ago. He reports current drug use. Drug: Marijuana. He reports that he does not drink alcohol.  ? ?Family History:  The patient's family history includes Anemia in his mother; Diabetes in his daughter; Hypertension in his daughter; Migraines in his daughter.  ? ? ?ROS:   ?Please see the  history of present illness. ?(+) Right chest pain ?(+) Bilateral LE aches/pain ?(+) Lightheadedness ?All other systems are reviewed and negative.  ? ? ?PHYSICAL EXAM: ?VS:  BP 118/76 (BP Location: Left Arm, Patient Position: Sitting, Cuff Size: Normal)   Pulse 61   Ht '5\' 6"'$  (1.676 m)   Wt 172 lb 12.8 oz (78.4 kg)   BMI 27.89 kg/m?  , BMI Body mass index is 27.89 kg/m?. ?GENERAL:  Well appearing ?HEENT: Pupils equal round and reactive, fundi not visualized, oral mucosa unremarkable ?NECK:  No jugular venous distention, waveform within normal limits, carotid upstroke brisk and symmetric, no bruits ?LUNGS:  Clear to auscultation bilaterally ?HEART:  RRR.  PMI not displaced or sustained,S1 and S2 within normal limits, no S3, no S4, no clicks, no rubs, no murmurs ?ABD:  Flat, positive bowel sounds normal in frequency in pitch, no bruits, no rebound, no guarding, no midline pulsatile mass, no hepatomegaly, no splenomegaly ?EXT:  2 plus TP, 1+ DP bilaterally.  no edema, no cyanosis no clubbing ?SKIN:  No rashes no nodules ?NEURO:  Cranial nerves II through XII grossly intact, motor grossly intact throughout ?PSYCH:  Cognitively intact, oriented to person place and time ? ? ?EKG:   ?04/30/15: sinus bradycardia at 59 bpm. ?03/30/12: Sinus rhythm. Rate 73 bpm. PVCs.  ?07/28/19: Sinus bradycardia.  Rate 57 bpm.  Non-specific T wave abnormalities.  ?02/21/2021: Sinus bradycardia. Rate 50 bpm.\ ?02/06/2021: Sinus rhythm.  Rate 61 bpm.  Nonspecific T wave abnormalities. ? ?TTE 12/03/07: LVEF 55-65%.  Trace MR. ?  ?LHC 07/03/04: 40% mid LAD, 30% mid LCx, 40% mid RCA, 20% distal  RCA.  LV gram LVEF 50%. ?  ?Exercise Myoview 12/03/07: Exercised 7 minutes on Bruce protocol.  8 METS.  Adequate HR response.  No infarct or inducible ischemia. ?  ?30 day event monitor 04/28/18: ?Quality: Fair.  Ba

## 2022-02-06 NOTE — Patient Instructions (Signed)
Medication Instructions:  ?Your physician recommends that you continue on your current medications as directed. Please refer to the Current Medication list given to you today. ? ? ?*If you need a refill on your cardiac medications before your next appointment, please call your pharmacy* ? ?Lab Work: ?NONE ? ?Testing/Procedures: ?NONE ? ?Follow-Up: ?At [facility], you and your health needs are our priority.  As part of our continuing mission to provide you with exceptional heart care, we have created designated Provider Care Teams.  These Care Teams include your primary Cardiologist (physician) and Advanced Practice Providers (APPs -  Physician Assistants and Nurse Practitioners) who all work together to provide you with the care you need, when you need it. ? ?We recommend signing up for the patient portal called "MyChart".  Sign up information is provided on this After Visit Summary.  MyChart is used to connect with patients for Virtual Visits (Telemedicine).  Patients are able to view lab/test results, encounter notes, upcoming appointments, etc.  Non-urgent messages can be sent to your provider as well.   ?To learn more about what you can do with MyChart, go to [email or web address].   ? ?Your next appointment:   ?12 month(s) ? ?The format for your next appointment:   ?In Person ? ?Provider:   ?Skeet Latch, MD{ ? ?Other Instructions ?HOLD YOUR ATORVASTATIN FOR 2 WEEKS, CALL OFFICE WITH UPDATE ? ?PAM OR LORA FROM THE PREP ([facility]) PROGRAM WILL BE IN TOUCH  ? ? ? ? ?

## 2022-02-07 ENCOUNTER — Telehealth: Payer: Self-pay

## 2022-02-07 NOTE — Telephone Encounter (Signed)
Call to pt reference PREP referral ?Explained program to pt and location ?Can do daytime class at [place] ?Can start 03/03/22 1030a to 1145a x 12wks ?Explained will call pt back closer to start of class to do intake  ? ?

## 2022-02-11 ENCOUNTER — Other Ambulatory Visit: Payer: Self-pay | Admitting: Internal Medicine

## 2022-02-11 DIAGNOSIS — F319 Bipolar disorder, unspecified: Secondary | ICD-10-CM

## 2022-02-14 ENCOUNTER — Telehealth: Payer: Self-pay

## 2022-02-14 NOTE — Telephone Encounter (Signed)
Confirmed with pt he can do 03/03/22 [id number] M/W at [facility] Asked if his friend could also attend. I asked that she have her PCP contact me for a referral. Confirmed they have my cell for contact.  Intact scheduled for 5/30 at 11am at [facility]

## 2022-02-18 ENCOUNTER — Telehealth: Payer: Self-pay | Admitting: Cardiovascular Disease

## 2022-02-18 DIAGNOSIS — E785 Hyperlipidemia, unspecified: Secondary | ICD-10-CM

## 2022-02-18 NOTE — Telephone Encounter (Signed)
RN returning call to patient to discuss telephone encounter. Patient took a statin holiday at [employer], and leg pain resolved in right leg, but has not resolved in the left leg. No symptoms of possible clots.    Routing to Dr. Oval Linsey and Alvina Filbert for review.

## 2022-02-18 NOTE — Telephone Encounter (Signed)
Patient states even being off of his cholesterol medication. He is still having the pain going down the back of his leg, especially his left leg, that one has the worse pain.   Pt c/o medication issue:  1. Name of Medication: atorvastatin (LIPITOR) 80 MG tablet  2. How are you currently taking this medication (dosage and times per day)? Take 1 tablet (80 mg total) by mouth daily.  3. Are you having a reaction (difficulty breathing--STAT)?   4. What is your medication issue?  Patient states even being off of his cholesterol medication for about 8 days. He is still having the pain going down the back of his legs. Especially his left leg, that one has the worse pain.

## 2022-02-20 ENCOUNTER — Telehealth: Payer: Self-pay

## 2022-02-20 NOTE — Telephone Encounter (Signed)
Pt is calling requesting a refill on: topiramate (TOPAMAX) 100 MG  Prescribing Provider Metta Clines.  Discontinued by: Vennie Homans on 02/06/2022 10:01  Reason: Discontinued by provider        Pharmacy: [facility] [place], [place]  Pt was unaware that it was D/C.  Please advise

## 2022-02-21 MED ORDER — ROSUVASTATIN CALCIUM 20 MG PO TABS
20.0000 mg | ORAL_TABLET | Freq: Every day | ORAL | 3 refills | Status: DC
Start: 2022-02-21 — End: 2022-05-16

## 2022-02-21 NOTE — Telephone Encounter (Signed)
Labs ordered and mailed, prescriptions updated and sent to pharmacy on file       "Please have him try rosuvastatin '20mg'$ .  Let me know if symptoms recur.  Check lipids/CMP in 2-3 months.   TCR"

## 2022-02-25 ENCOUNTER — Telehealth: Payer: Self-pay | Admitting: Internal Medicine

## 2022-02-25 NOTE — Telephone Encounter (Signed)
Pt called in and states he has been trying to get fill on Magoxide '400mg'$   Informed pt that is not on list of active meds.   Please call pt with an update ASAP.

## 2022-02-25 NOTE — Telephone Encounter (Signed)
Pt is calling for a updates on the Refill.  I advise pt to reach out the prescribing provider to see why it was D/C

## 2022-02-25 NOTE — Telephone Encounter (Signed)
Noted  

## 2022-02-25 NOTE — Telephone Encounter (Signed)
Called pt, LVM to discuss.  

## 2022-02-27 NOTE — Progress Notes (Signed)
[facility] PREP Evaluation  Patient Details  Name: Jaime Barnes MRN: [id number] Date of Birth: 07/14/1951 Age: 71 y.o. PCP: Janith Lima, MD  Vitals:   02/27/22 1644  BP: 140/78  Pulse: 84  Weight: 171 lb 9.6 oz (77.8 kg)     [facility] Eval - 02/27/22 1600       [facility] "PREP" Location   [facility] "PREP" Location [facility]      Referral    Referring Provider Oval Linsey    Reason for referral High Cholesterol;Hypertension;Inactivity    Program Start Date 03/03/22   M/W 1030am to 1145am x 12 wks     Measurement   Waist Circumference 41 inches    Hip Circumference 39 inches    Body fat 30.4 percent      Information for Trainer   Goals Get back to exercising, get more strong and active    Current Exercise intermitten push ups home workout    Orthopedic Concerns Neck stiffness, low back pain, left hip pain with walking    Pertinent Medical History Inc chol, HTN    Current Barriers none other than appts    Restrictions/Precautions Other   loses balance/lightheaded from time to time-no falls   Medications that affect exercise Medication causing dizziness/drowsiness;Asthma inhaler;Beta blocker      Timed Up and Go (TUGS)   Timed Up and Go Low risk <9 seconds      Mobility and Daily Activities   I find it easy to walk up or down two or more flights of stairs. 2    I have no trouble taking out the trash. 4    I do housework such as vacuuming and dusting on my own without difficulty. 2    I can easily lift a gallon of milk (8lbs). 4    I can easily walk a mile. 1    I have no trouble reaching into high cupboards or reaching down to pick up something from the floor. 4    I do not have trouble doing out-door work such as Armed forces logistics/support/administrative officer, raking leaves, or gardening. 4      Mobility and Daily Activities   I feel younger than my age. 2    I feel independent. 3    I feel energetic. 2    I live an active life.  3    I feel strong. 3    I feel healthy. 3    I feel active as other people  my age. 4      How fit and strong are you.   Fit and Strong Total Score 41            Past Medical History:  Diagnosis Date   Barrett's esophagus 2011   Recommend repeat EGD for surveillance in 2 yrs-[facility]   Bipolar disorder ([place])    Chronic kidney disease    Cirrhosis ([place])    Diverticulosis of colon    GANGLION CYST, TENDON SHEATH 06/26/2010   Hyperlipidemia    Hypertension    Hypertriglyceridemia 01/21/2019   Impotence    Migraine    Pancreatitis    Pulmonary nodule    Pulmonary nodule seen on imaging study 02/06/2011   Followup CT in December 2014 with 20 months of stability at 4 mm.  No further imaging recommended    Past Surgical History:  Procedure Laterality Date   CARDIAC CATHETERIZATION  2005   Nonobstructive, <40%   TONSILLECTOMY Bilateral 1961   Social History  Tobacco Use  Smoking Status Light Smoker   Types: Cigars  Smokeless Tobacco Former   Quit date: 03/08/2017  Tobacco Comments   cigar occassionally     [name] 02/27/2022, 4:49 PM

## 2022-03-05 ENCOUNTER — Ambulatory Visit (INDEPENDENT_AMBULATORY_CARE_PROVIDER_SITE_OTHER): Payer: Medicare Other | Admitting: Podiatry

## 2022-03-05 ENCOUNTER — Encounter: Payer: Self-pay | Admitting: Podiatry

## 2022-03-05 DIAGNOSIS — N183 Chronic kidney disease, stage 3 unspecified: Secondary | ICD-10-CM | POA: Diagnosis not present

## 2022-03-05 DIAGNOSIS — M79675 Pain in left toe(s): Secondary | ICD-10-CM | POA: Diagnosis not present

## 2022-03-05 DIAGNOSIS — M79674 Pain in right toe(s): Secondary | ICD-10-CM

## 2022-03-05 DIAGNOSIS — B351 Tinea unguium: Secondary | ICD-10-CM

## 2022-03-05 DIAGNOSIS — I739 Peripheral vascular disease, unspecified: Secondary | ICD-10-CM | POA: Diagnosis not present

## 2022-03-05 NOTE — Progress Notes (Signed)
This patient returns to my office for at risk foot care.  This patient requires this care by a professional since this patient will be at risk due to having chronic kidney disease  This patient is unable to cut nails himself since the patient cannot reach his nails.These nails are painful walking and wearing shoes.  This patient presents for at risk foot care today.  General Appearance  Alert, conversant and in no acute stress.  Vascular  Dorsalis pedis and posterior tibial  pulses are weakly  palpable  bilaterally.  Capillary return is within normal limits  bilaterally. Temperature is within normal limits  bilaterally.  Neurologic  Senn-Weinstein monofilament wire test within normal limits  bilaterally. Muscle power within normal limits bilaterally.  Nails Thick disfigured discolored nails with subungual debris  from hallux to fifth toes bilaterally. No evidence of bacterial infection or drainage bilaterally.  Orthopedic  No limitations of motion  feet .  No crepitus or effusions noted.  No bony pathology or digital deformities noted.  Skin  normotropic skin with no porokeratosis noted bilaterally.  No signs of infections or ulcers noted.     Onychomycosis  Pain in right toes  Pain in left toes  Consent was obtained for treatment procedures.   Mechanical debridement of nails 1-5  bilaterally performed with a nail nipper.  Filed with dremel without incident.    Return office visit   10 weeks                  Told patient to return for periodic foot care and evaluation due to potential at risk complications.   Clemence Stillings DPM  

## 2022-03-05 NOTE — Progress Notes (Signed)
[facility] PREP Weekly Session  Patient Details  Name: Jaime Barnes MRN: [id number] Date of Birth: October 01, 1950 Age: 72 y.o. PCP: Janith Lima, MD  Vitals:   03/03/22 1030  Weight: 167 lb (75.8 kg)     [facility] Weekly seesion - 03/05/22 0900       [facility] "PREP" Location   [facility] "PREP" Location [facility]      Weekly Session   Topic Discussed Goal setting and welcome to the program   Scale of perceived exertion   Classes attended to date [place] 03/05/2022, 9:57 AM

## 2022-03-06 ENCOUNTER — Other Ambulatory Visit (HOSPITAL_COMMUNITY): Payer: Self-pay

## 2022-03-07 ENCOUNTER — Ambulatory Visit (INDEPENDENT_AMBULATORY_CARE_PROVIDER_SITE_OTHER): Payer: Medicare Other

## 2022-03-07 DIAGNOSIS — E538 Deficiency of other specified B group vitamins: Secondary | ICD-10-CM

## 2022-03-07 MED ORDER — CYANOCOBALAMIN 1000 MCG/ML IJ SOLN
1000.0000 ug | Freq: Once | INTRAMUSCULAR | Status: AC
  Administered 2022-03-07: 1000 ug via INTRAMUSCULAR

## 2022-03-07 NOTE — Progress Notes (Signed)
B12 given and tolerated well °

## 2022-03-11 ENCOUNTER — Other Ambulatory Visit: Payer: Self-pay | Admitting: Internal Medicine

## 2022-03-11 ENCOUNTER — Telehealth: Payer: Self-pay | Admitting: Internal Medicine

## 2022-03-11 DIAGNOSIS — I10 Essential (primary) hypertension: Secondary | ICD-10-CM

## 2022-03-11 DIAGNOSIS — J45990 Exercise induced bronchospasm: Secondary | ICD-10-CM

## 2022-03-11 MED ORDER — ALBUTEROL SULFATE HFA 108 (90 BASE) MCG/ACT IN AERS: INHALATION_SPRAY | RESPIRATORY_TRACT | 5 refills | Status: DC

## 2022-03-11 MED ORDER — AMLODIPINE BESYLATE 10 MG PO TABS: ORAL_TABLET | ORAL | 0 refills | Status: DC

## 2022-03-11 NOTE — Telephone Encounter (Signed)
Also needs amLODipine (NORVASC) 10 MG tablet refilled at [facility] on the corner of [address] and [address] please

## 2022-03-11 NOTE — Progress Notes (Signed)
[facility] PREP Weekly Session  Patient Details  Name: Jaime Barnes MRN: [id number] Date of Birth: April 21, 1951 Age: 71 y.o. PCP: Janith Lima, MD  Vitals:   03/10/22 1030  Weight: 171 lb (77.6 kg)     [facility] Weekly seesion - 03/11/22 1700       [facility] "PREP" Location   [facility] "PREP" Location [facility]      Weekly Session   Topic Discussed Importance of resistance training;Other ways to be active    Classes attended to date 2             [name] 03/11/2022, 5:57 PM

## 2022-03-11 NOTE — Telephone Encounter (Signed)
PT calls today in need of a refill on their albuterol inhaler. PT would like this sent out to the [facility] on the corner of [address] and [address] on file!  CB if needed: [phone]  Also FYI for PT, he had another medication that he will need refilled but did not know the name, states he will call us back once he confirms what that medication was with [facility].

## 2022-03-18 NOTE — Progress Notes (Signed)
[facility] PREP Weekly Session  Patient Details  Name: Jaime Barnes MRN: [id number] Date of Birth: August 27, 1951 Age: 71 y.o. PCP: Janith Lima, MD  Vitals:   03/17/22 1030  Weight: 170 lb (77.1 kg)     [facility] Weekly seesion - 03/18/22 0900       [facility] "PREP" Location   [facility] "PREP" Location [facility]      Weekly Session   Topic Discussed Healthy eating tips    Classes attended to date [place] 03/18/2022, 9:51 AM

## 2022-03-19 NOTE — Telephone Encounter (Signed)
Rx sent on 6/13 for 90d supply.

## 2022-03-19 NOTE — Telephone Encounter (Signed)
Caller & Relationship to patient: Murvin Gift  Call back number: [phone]  Date of last office visit: 12/31/21  Date of next office visit:   Medication(s) to be refilled:  amLODipine      Preferred Pharmacy:  [facility] [place], [place] - [address] Phone:  [phone]  Fax:  [phone]

## 2022-03-24 DIAGNOSIS — K7460 Unspecified cirrhosis of liver: Secondary | ICD-10-CM | POA: Diagnosis not present

## 2022-03-24 DIAGNOSIS — D509 Iron deficiency anemia, unspecified: Secondary | ICD-10-CM | POA: Diagnosis not present

## 2022-03-24 DIAGNOSIS — K219 Gastro-esophageal reflux disease without esophagitis: Secondary | ICD-10-CM | POA: Diagnosis not present

## 2022-04-06 ENCOUNTER — Other Ambulatory Visit: Payer: Self-pay | Admitting: Internal Medicine

## 2022-04-06 DIAGNOSIS — F5104 Psychophysiologic insomnia: Secondary | ICD-10-CM

## 2022-04-06 DIAGNOSIS — M792 Neuralgia and neuritis, unspecified: Secondary | ICD-10-CM

## 2022-04-06 DIAGNOSIS — M19011 Primary osteoarthritis, right shoulder: Secondary | ICD-10-CM

## 2022-04-07 ENCOUNTER — Ambulatory Visit (INDEPENDENT_AMBULATORY_CARE_PROVIDER_SITE_OTHER): Payer: Medicare Other

## 2022-04-07 DIAGNOSIS — E538 Deficiency of other specified B group vitamins: Secondary | ICD-10-CM

## 2022-04-07 MED ORDER — CYANOCOBALAMIN 1000 MCG/ML IJ SOLN
1000.0000 ug | Freq: Once | INTRAMUSCULAR | Status: AC
  Administered 2022-04-07: 1000 ug via INTRAMUSCULAR

## 2022-04-07 NOTE — Progress Notes (Signed)
After obtaining consent, and per orders of Dr. Jones, injection of B12 given by Lexiana Spindel A Londyn Wotton. Patient tolerated injection well in right deltoid and was instructed to report any adverse reaction to me immediately.  

## 2022-04-17 NOTE — Progress Notes (Signed)
[facility] PREP Weekly Session  Patient Details  Name: Jaime Barnes MRN: [id number] Date of Birth: 1951/05/19 Age: 71 y.o. PCP: Janith Lima, MD  Vitals:   04/14/22 1030  Weight: 174 lb (78.9 kg)     [facility] Weekly seesion - 04/17/22 1000       [facility] "PREP" Location   [facility] "PREP" Location [facility]      Weekly Session   Topic Discussed Stress management and problem solving    Minutes exercised this week --   not reported   Classes attended to date [place] 04/17/2022, 10:02 AM

## 2022-04-30 NOTE — Progress Notes (Signed)
[facility] PREP Weekly Session  Patient Details  Name: Jaime Barnes MRN: [id number] Date of Birth: Feb 06, 1951 Age: 71 y.o. PCP: Janith Lima, MD  Vitals:   04/28/22 1030  Weight: 172 lb (78 kg)     [facility] Weekly seesion - 04/30/22 0900       [facility] "PREP" Location   [facility] "PREP" [facility]      Weekly Session   Topic Discussed --   portions   Minutes exercised this week --   not recorded   Classes attended to date 11             [facility] 04/30/2022, 9:54 AM

## 2022-05-02 ENCOUNTER — Telehealth: Payer: Self-pay | Admitting: Cardiovascular Disease

## 2022-05-02 NOTE — Telephone Encounter (Signed)
error 

## 2022-05-06 ENCOUNTER — Other Ambulatory Visit: Payer: Self-pay | Admitting: *Deleted

## 2022-05-06 DIAGNOSIS — E785 Hyperlipidemia, unspecified: Secondary | ICD-10-CM | POA: Diagnosis not present

## 2022-05-06 DIAGNOSIS — D509 Iron deficiency anemia, unspecified: Secondary | ICD-10-CM | POA: Diagnosis not present

## 2022-05-06 NOTE — Progress Notes (Signed)
[facility] PREP Weekly Session  Patient Details  Name: Jaime Barnes MRN: [id number] Date of Birth: 06-09-1951 Age: 71 y.o. PCP: Janith Lima, MD  There were no vitals filed for this visit.   [facility] Weekly seesion - 05/06/22 1600       [facility] "PREP" Location   [facility] "PREP" Location [facility]      Weekly Session   Topic Discussed Finding support    Minutes exercised this week --   not reported   Classes attended to date 13             [name] 05/06/2022, 4:04 PM

## 2022-05-06 NOTE — Patient Outreach (Signed)
  Care Coordination   Requested a call back for the initial  Visit Note   05/06/2022 Name: Jaime Barnes MRN: [id number] DOB: Oct 18, 1950  Jaime Barnes is a 71 y.o. year old male who sees Janith Lima, MD for primary care. I spoke with  [name] by phone today  What matters to the patients health and wellness today?  Request a call back    Goals Addressed   None     SDOH assessments and interventions completed:  No     Care Coordination Interventions Activated:  No  Care Coordination Interventions:  No, not indicated   Follow up plan: Follow up call scheduled for 8/9 '@2'$ :00 PM    Encounter Outcome:  Pt. Request to Call Back   Raina Mina, RN Care Management Coordinator [place] Office [phone]

## 2022-05-07 ENCOUNTER — Telehealth: Payer: Self-pay

## 2022-05-07 ENCOUNTER — Encounter: Payer: Self-pay | Admitting: *Deleted

## 2022-05-07 ENCOUNTER — Ambulatory Visit: Payer: Self-pay | Admitting: *Deleted

## 2022-05-07 DIAGNOSIS — F319 Bipolar disorder, unspecified: Secondary | ICD-10-CM

## 2022-05-07 LAB — HEPATIC FUNCTION PANEL
ALT: 13 IU/L (ref 0–44)
AST: 14 IU/L (ref 0–40)
Albumin: 4.6 g/dL (ref 3.8–4.8)
Alkaline Phosphatase: 90 IU/L (ref 44–121)
Bilirubin Total: <0.2 mg/dL (ref 0.0–1.2)
Bilirubin, Direct: <0.1 mg/dL (ref 0.00–0.40)
Total Protein: 7.1 g/dL (ref 6.0–8.5)

## 2022-05-07 LAB — LIPID PANEL
Chol/HDL Ratio: 3.6 ratio (ref 0.0–5.0)
Cholesterol, Total: 124 mg/dL (ref 100–199)
HDL: 34 mg/dL — ABNORMAL LOW (ref >39)
LDL Chol Calc (NIH): 51 mg/dL (ref 0–99)
Triglycerides: 245 mg/dL — ABNORMAL HIGH (ref 0–149)
VLDL Cholesterol Cal: 39 mg/dL (ref 5–40)

## 2022-05-07 NOTE — Patient Outreach (Signed)
  Care Coordination   Initial Visit Note   05/07/2022 Name: Jaime Barnes MRN: [id number] DOB: Apr 05, 1951  [name] is a 71 y.o. year old male who sees Janith Lima, MD for primary care. I spoke with  [name] by phone today  What matters to the patients health and wellness today?  Mental health/psychiatry referral from PCP    Goals Addressed               This Visit's Progress     Request referral to psychiatry (mental health) (pt-stated)        Care Coordination Interventions: Reviewed medications with patient and discussed purpose of all medications Collaborated with Dr. Ronnald Ramp regarding pt's request for a psychiatry with mental health services Reviewed scheduled/upcoming provider appointments including [facility] 07/01/2022 also reviewed all other pending appointments. Care Guide referral for Mental health agency listings Screening for signs and symptoms of depression related to chronic disease state  Assessed social determinant of health barriers          SDOH assessments and interventions completed:  Yes  SDOH Interventions Today    Flowsheet Row Most Recent Value  SDOH Interventions   Food Insecurity Interventions Intervention Not Indicated  Housing Interventions Intervention Not Indicated  Transportation Interventions Intervention Not Indicated  Depression Interventions/Treatment  --  [Requesting a new mental health referral for psychiatry]        Care Coordination Interventions Activated:  Yes  Care Coordination Interventions:  Yes, provided   Follow up plan: No further intervention required.   Encounter Outcome:  Pt. Visit Completed   Raina Mina, RN Care Management Coordinator [place] Office [phone]

## 2022-05-07 NOTE — Patient Instructions (Signed)
Visit Information  Thank you for taking time to visit with me today. Please don't hesitate to contact me if I can be of assistance to you.   Following are the goals we discussed today:   Goals Addressed               This Visit's Progress     Request referral to psychiatry (mental health) (pt-stated)        Care Coordination Interventions: Reviewed medications with patient and discussed purpose of all medications Collaborated with Dr. Ronnald Ramp regarding pt's request for a psychiatry with mental health services Reviewed scheduled/upcoming provider appointments including [facility] 07/01/2022 also reviewed all other pending appointments. Care Guide referral for Mental health agency listings Screening for signs and symptoms of depression related to chronic disease state  Assessed social determinant of health barriers            Please call the care guide team at [phone] if you need to cancel or reschedule your appointment.   If you are experiencing a Mental Health or [place] or need someone to talk to, please call the Suicide and Crisis Lifeline: 988  The patient verbalized understanding of instructions, educational materials, and care plan provided today and agreed to receive a mailed copy of patient instructions, educational materials, and care plan.   No further follow up required: No additional needs  Raina Mina, RN Care Management Coordinator [place] Office [phone]

## 2022-05-07 NOTE — Telephone Encounter (Signed)
   Telephone encounter was:  Successful.  05/07/2022 Name: Jaime Barnes MRN: [id number] DOB: 1951-02-27  Jaime Barnes is a 71 y.o. year old male who is a primary care patient of Janith Lima, MD . The community resource team was consulted for assistance with  mental health  Care guide performed the following interventions: Patient provided with information about care guide support team and interviewed to confirm resource needs.Patient stated he needed mental health assistance and therapy. Patient stated he needs to get back on his medication  Follow Up Plan:  Care guide will follow up with patient by phone over the next day    [place], Care Management  [phone] [address], [place], [place] [address] Phone: [phone] Email: [email or web address]

## 2022-05-08 ENCOUNTER — Ambulatory Visit (INDEPENDENT_AMBULATORY_CARE_PROVIDER_SITE_OTHER): Payer: Medicare Other | Admitting: *Deleted

## 2022-05-08 DIAGNOSIS — E538 Deficiency of other specified B group vitamins: Secondary | ICD-10-CM | POA: Diagnosis not present

## 2022-05-08 MED ORDER — CYANOCOBALAMIN 1000 MCG/ML IJ SOLN
1000.0000 ug | Freq: Once | INTRAMUSCULAR | Status: AC
  Administered 2022-05-08: 1000 ug via INTRAMUSCULAR

## 2022-05-08 NOTE — Progress Notes (Signed)
Pls cosign for B12 inj../lmb  

## 2022-05-09 ENCOUNTER — Telehealth: Payer: Self-pay

## 2022-05-09 ENCOUNTER — Other Ambulatory Visit: Payer: Self-pay | Admitting: Internal Medicine

## 2022-05-09 DIAGNOSIS — B009 Herpesviral infection, unspecified: Secondary | ICD-10-CM

## 2022-05-09 NOTE — Telephone Encounter (Signed)
   Telephone encounter was:  Successful.  05/09/2022 Name: Jaime Barnes MRN: [id number] DOB: [birth year]  Jaime Barnes is a 71 y.o. year old male who is a primary care patient of Janith Lima, MD . The community resource team was consulted for assistance with  mental health  Care guide performed the following interventions: Patient provided with information about care guide support team and interviewed to confirm resource needs.  Follow Up Plan:  No further follow up planned at this time. The patient has been provided with needed resources.    [place], Care Management  [phone] [address], [place], [place] [address] Phone: [phone] Email: [email or web address]

## 2022-05-12 ENCOUNTER — Other Ambulatory Visit: Payer: Self-pay | Admitting: Cardiovascular Disease

## 2022-05-16 ENCOUNTER — Telehealth: Payer: Self-pay | Admitting: Cardiovascular Disease

## 2022-05-16 DIAGNOSIS — I2510 Atherosclerotic heart disease of native coronary artery without angina pectoris: Secondary | ICD-10-CM

## 2022-05-16 DIAGNOSIS — E781 Pure hyperglyceridemia: Secondary | ICD-10-CM

## 2022-05-16 MED ORDER — ICOSAPENT ETHYL 1 G PO CAPS: 2.0000 g | ORAL_CAPSULE | Freq: Two times a day (BID) | ORAL | 3 refills | Status: DC

## 2022-05-16 MED ORDER — ROSUVASTATIN CALCIUM 20 MG PO TABS: 20.0000 mg | ORAL_TABLET | Freq: Every day | ORAL | 3 refills | Status: DC

## 2022-05-16 NOTE — Telephone Encounter (Signed)
Patient is returning call to discuss lab results. 

## 2022-05-16 NOTE — Telephone Encounter (Signed)
Returned call to pt. Lab results given.   Jaime Dubonnet, NP  05/13/2022 12:43 PM EDT     Normal liver enzymes. Total cholesterol, LDL at goal. Triglycerides are elevated at 245 with goal of less than 150.    Continue Rosuvastatin '20mg'$  daily - please ensure tolerating well.    Please ensure was fasting for labs. If not, repeat fasting lipid panel to assess triglycerides. If he was fasting, recommend addition of vascepa and reducing intake of carbs, sweets, sugars.     Pt stated he has been handling the Rosuvastatin well, just needed a refill. He also stated he was fasting for the labs. Informed him of Caitlin's recommendation of starting Vascepa 2 g BID along with the Rosuvastatin, and to reduce his intake of carbs, sweets, and sugars. Pt verbalized understanding and thanks for the call.  Rxs sent to [facility] per pt request.

## 2022-05-19 ENCOUNTER — Other Ambulatory Visit: Payer: Self-pay | Admitting: Gastroenterology

## 2022-05-19 DIAGNOSIS — K7460 Unspecified cirrhosis of liver: Secondary | ICD-10-CM

## 2022-05-20 ENCOUNTER — Other Ambulatory Visit: Payer: Self-pay | Admitting: Cardiovascular Disease

## 2022-05-26 ENCOUNTER — Ambulatory Visit
Admission: RE | Admit: 2022-05-26 | Discharge: 2022-05-26 | Disposition: A | Discharge status: Home / Self Care | Payer: Medicare Other | Source: Ambulatory Visit | Attending: Gastroenterology | Admitting: Gastroenterology

## 2022-05-26 DIAGNOSIS — K7460 Unspecified cirrhosis of liver: Secondary | ICD-10-CM | POA: Diagnosis not present

## 2022-05-26 DIAGNOSIS — K7689 Other specified diseases of liver: Secondary | ICD-10-CM | POA: Diagnosis not present

## 2022-05-27 ENCOUNTER — Other Ambulatory Visit: Payer: Self-pay | Admitting: Cardiovascular Disease

## 2022-05-27 ENCOUNTER — Telehealth: Payer: Self-pay | Admitting: Cardiovascular Disease

## 2022-05-27 DIAGNOSIS — E785 Hyperlipidemia, unspecified: Secondary | ICD-10-CM

## 2022-05-27 DIAGNOSIS — E781 Pure hyperglyceridemia: Secondary | ICD-10-CM

## 2022-05-27 DIAGNOSIS — I10 Essential (primary) hypertension: Secondary | ICD-10-CM

## 2022-05-27 DIAGNOSIS — Z5181 Encounter for therapeutic drug level monitoring: Secondary | ICD-10-CM

## 2022-05-27 NOTE — Telephone Encounter (Signed)
Pt is returning call and is requesting call back.  

## 2022-05-27 NOTE — Telephone Encounter (Signed)
-----   Message from Loel Dubonnet, NP sent at 05/13/2022 12:43 PM EDT ----- Normal liver enzymes. Total cholesterol, LDL at goal. Triglycerides are elevated at 245 with goal of less than 150.   Continue Rosuvastatin '20mg'$  daily - please ensure tolerating well.   Please ensure was fasting for labs. If not, repeat fasting lipid panel to assess triglycerides. If he was fasting, recommend addition of vascepa and reducing intake of carbs, sweets, sugars.

## 2022-05-27 NOTE — Telephone Encounter (Addendum)
Advised patient of lab results  Mailed lab orders to recheck in 2-3  months  Patient stated he already picked up prescription

## 2022-05-29 ENCOUNTER — Telehealth: Payer: Self-pay | Admitting: Cardiovascular Disease

## 2022-05-29 DIAGNOSIS — I10 Essential (primary) hypertension: Secondary | ICD-10-CM

## 2022-05-29 MED ORDER — AMLODIPINE BESYLATE 10 MG PO TABS: ORAL_TABLET | ORAL | 0 refills | Status: DC

## 2022-05-29 NOTE — Telephone Encounter (Signed)
  Pt c/o medication issue:  1. Name of Medication: lisinopril 20 mg  2. How are you currently taking this medication (dosage and times per day)? 1 tablet a day  3. Are you having a reaction (difficulty breathing--STAT)? no  4. What is your medication issue? Pt said, he needs refill of this medication but its not on his med list.  His pharmacy:  [facility] [place]

## 2022-05-29 NOTE — Telephone Encounter (Signed)
Returned call to patient to discuss request for Lisinopril refill.  Lisinopril no longer on medication list. Asked patient if he was taking Amlodipine he stated he didn't have Amlodipine.  Call to pharmacy pt had Lisinopril refilled in May 2023 for #90 and Amlodipine refilled in July 2023 #90. Call back to discuss with patient scheduling an appt to bring all meds in for review.  Message left requesting return call. Georgana Curio MHA RN CCM  Call Back to patient he is aware of appt scheduled for Sept 11, 2023 at 10:55 with Laurann Montana.  Pt was instructed to continue taking medication as he has been doing and bring all of his medications to his upcoming appt.   Pt verbalized understanding of instructions as provided. Georgana Curio MHA RN CCM

## 2022-05-29 NOTE — Telephone Encounter (Signed)
NOTE NOT NEEDED ?

## 2022-06-03 ENCOUNTER — Encounter: Payer: Medicare Other | Admitting: Podiatry

## 2022-06-04 ENCOUNTER — Ambulatory Visit: Payer: Medicare Other | Admitting: Podiatry

## 2022-06-05 NOTE — Progress Notes (Signed)
[facility] PREP Evaluation  Patient Details  Name: Jaime Barnes MRN: [id number] Date of Birth: 26-Sep-1951 Age: 71 y.o. PCP: Jaime Lima, MD  Vitals:   06/04/22 1030  BP: 112/78  Pulse: 75  SpO2: 94%  Weight: 174 lb 3.2 oz (79 kg)     [facility] Eval - 06/05/22 1200       [facility] "PREP" Location   [facility] "PREP" Location [facility]      Referral    Referring Provider [facility] Date --   Final PREP class date 05/26/22     Measurement   Waist Circumference 42 inches    Hip Circumference 39 inches    Body fat 31.7 percent      Information for Trainer   Goals reset      Mobility and Daily Activities   I find it easy to walk up or down two or more flights of stairs. 2    I have no trouble taking out the trash. 4    I do housework such as vacuuming and dusting on my own without difficulty. 4    I can easily lift a gallon of milk (8lbs). 4    I can easily walk a mile. 1    I have no trouble reaching into high cupboards or reaching down to pick up something from the floor. 4    I do not have trouble doing out-door work such as Armed forces logistics/support/administrative officer, raking leaves, or gardening. 2      Mobility and Daily Activities   I feel younger than my age. 2    I feel independent. 4    I feel energetic. 2    I live an active life.  2    I feel strong. 3    I feel healthy. 4    I feel active as other people my age. 2      How fit and strong are you.   Fit and Strong Total Score 40            Past Medical History:  Diagnosis Date   Barrett's esophagus 2011   Recommend repeat EGD for surveillance in 2 yrs-[facility]   Bipolar disorder ([place])    Chronic kidney disease    Cirrhosis ([place])    Diverticulosis of colon    GANGLION CYST, TENDON SHEATH 06/26/2010   Hyperlipidemia    Hypertension    Hypertriglyceridemia 01/21/2019   Impotence    Migraine    Pancreatitis    Pulmonary nodule    Pulmonary nodule seen on imaging study 02/06/2011   Followup CT in December 2014 with 20  months of stability at 4 mm.  No further imaging recommended    Past Surgical History:  Procedure Laterality Date   CARDIAC CATHETERIZATION  2005   Nonobstructive, <40%   TONSILLECTOMY Bilateral 1961   Social History   Tobacco Use  Smoking Status Light Smoker   Types: Cigars  Smokeless Tobacco Former   Quit date: 03/08/2017  Tobacco Comments   cigar occassionally    Has plans to continue to exercise. Needs continued balance and strength work. Keep added sugars low. Encouraged to stop smoking. He has stopped cigarettes but continues with cigars. Talked about how hard smoking is on the body and blood vessels would be best to stop.  Attended 15 workouts and 6 of 12 educational sessions Cardio march test: 187 to 240 Sit to stand: 12 to 10 Biceps: 17 to 14  Jaime Barnes 06/05/2022, 12:21 PM

## 2022-06-06 ENCOUNTER — Ambulatory Visit: Payer: Medicare Other | Admitting: Podiatry

## 2022-06-06 NOTE — Progress Notes (Signed)
This encounter was created in error - please disregard.

## 2022-06-07 ENCOUNTER — Other Ambulatory Visit: Payer: Self-pay | Admitting: Internal Medicine

## 2022-06-07 DIAGNOSIS — M792 Neuralgia and neuritis, unspecified: Secondary | ICD-10-CM

## 2022-06-09 ENCOUNTER — Encounter (HOSPITAL_BASED_OUTPATIENT_CLINIC_OR_DEPARTMENT_OTHER): Payer: Self-pay | Admitting: Family

## 2022-06-09 ENCOUNTER — Ambulatory Visit (INDEPENDENT_AMBULATORY_CARE_PROVIDER_SITE_OTHER): Payer: Medicare Other | Admitting: Family

## 2022-06-09 ENCOUNTER — Ambulatory Visit (INDEPENDENT_AMBULATORY_CARE_PROVIDER_SITE_OTHER): Payer: Medicare Other

## 2022-06-09 VITALS — BP 114/62 | HR 63 | Ht 66.0 in | Wt 173.3 lb

## 2022-06-09 DIAGNOSIS — I25118 Atherosclerotic heart disease of native coronary artery with other forms of angina pectoris: Secondary | ICD-10-CM

## 2022-06-09 DIAGNOSIS — E785 Hyperlipidemia, unspecified: Secondary | ICD-10-CM | POA: Diagnosis not present

## 2022-06-09 DIAGNOSIS — E538 Deficiency of other specified B group vitamins: Secondary | ICD-10-CM | POA: Diagnosis not present

## 2022-06-09 DIAGNOSIS — I10 Essential (primary) hypertension: Secondary | ICD-10-CM

## 2022-06-09 MED ORDER — HYDROCHLOROTHIAZIDE 12.5 MG PO CAPS: 12.5000 mg | ORAL_CAPSULE | Freq: Every day | ORAL | 3 refills | Status: DC

## 2022-06-09 MED ORDER — ICOSAPENT ETHYL 1 G PO CAPS: 2.0000 g | ORAL_CAPSULE | Freq: Two times a day (BID) | ORAL | 3 refills | Status: DC

## 2022-06-09 MED ORDER — ROSUVASTATIN CALCIUM 20 MG PO TABS: 20.0000 mg | ORAL_TABLET | Freq: Every day | ORAL | 3 refills | Status: DC

## 2022-06-09 MED ORDER — CYANOCOBALAMIN 1000 MCG/ML IJ SOLN
1000.0000 ug | Freq: Once | INTRAMUSCULAR | Status: AC
  Administered 2022-06-09: 1000 ug via INTRAMUSCULAR

## 2022-06-09 MED ORDER — AMLODIPINE BESYLATE 10 MG PO TABS: ORAL_TABLET | ORAL | 3 refills | Status: DC

## 2022-06-09 NOTE — Patient Instructions (Signed)
Medication Instructions:  Continue your current medications.   You do not need Lisinopril.   *If you need a refill on your cardiac medications before your next appointment, please call your pharmacy*   Lab Work/ Procedures: None ordered today.   Follow-Up: At [facility], you and your health needs are our priority.  As part of our continuing mission to provide you with exceptional heart care, we have created designated Provider Care Teams.  These Care Teams include your primary Cardiologist (physician) and Advanced Practice Providers (APPs -  Physician Assistants and Nurse Practitioners) who all work together to provide you with the care you need, when you need it.  We recommend signing up for the patient portal called "MyChart".  Sign up information is provided on this After Visit Summary.  MyChart is used to connect with patients for Virtual Visits (Telemedicine).  Patients are able to view lab/test results, encounter notes, upcoming appointments, etc.  Non-urgent messages can be sent to your provider as well.   To learn more about what you can do with MyChart, go to [email or web address].    Your next appointment:   01/2023 with Dr. Oval Linsey  Other Instructions  Tips to Measure your Blood Pressure Correctly  Here's what you can do to ensure a correct reading:  Don't drink a caffeinated beverage or smoke during the 30 minutes before the test.  Sit quietly for five minutes before the test begins.  During the measurement, sit in a chair with your feet on the floor and your arm supported so your elbow is at about heart level.  The inflatable part of the cuff should completely cover at least 80% of your upper arm, and the cuff should be placed on bare skin, not over a shirt.  Don't talk during the measurement.   Blood pressure categories  Blood pressure category SYSTOLIC (upper number)  DIASTOLIC (lower number)  Normal Less than 120 mm Hg and Less than 80 mm Hg   Elevated 120-129 mm Hg and Less than 80 mm Hg  High blood pressure: Stage 1 hypertension 130-139 mm Hg or 80-89 mm Hg  High blood pressure: Stage 2 hypertension 140 mm Hg or higher or 90 mm Hg or higher  Hypertensive crisis (consult your doctor immediately) Higher than 180 mm Hg and/or Higher than 120 mm Hg  Source: American Heart Association and American Stroke Association. For more on getting your blood pressure under control, buy Controlling Your Blood Pressure, a Special Health Report from [facility], [place].   Blood Pressure Log   Date   Time  Blood Pressure  Example: Nov 1 9 AM 124/78                                               Important Information About Sugar

## 2022-06-09 NOTE — Progress Notes (Signed)
After obtaining consent, and per orders of Dr. Ronnald Ramp, injection of B12 given on right deltoid by Marrian Salvage. Patient instructed to report any adverse reaction to me immediately.

## 2022-06-09 NOTE — Progress Notes (Signed)
Office Visit    Patient Name: Jaime Barnes Date of Encounter: 06/09/2022  PCP:  Janith Lima, MD   [place]  Cardiologist:  Skeet Latch, MD  Advanced Practice Provider:  No care team member to display Electrophysiologist:  None      Chief Complaint    Jaime Barnes is a 71 y.o. male presents today for medication management  Past Medical History    Past Medical History:  Diagnosis Date   Barrett's esophagus 2011   Recommend repeat EGD for surveillance in 2 yrs-[facility]   Bipolar disorder ([place])    Chronic kidney disease    Cirrhosis ([place])    Diverticulosis of colon    GANGLION CYST, TENDON SHEATH 06/26/2010   Hyperlipidemia    Hypertension    Hypertriglyceridemia 01/21/2019   Impotence    Migraine    Pancreatitis    Pulmonary nodule    Pulmonary nodule seen on imaging study 02/06/2011   Followup CT in December 2014 with 20 months of stability at 4 mm.  No further imaging recommended    Past Surgical History:  Procedure Laterality Date   CARDIAC CATHETERIZATION  2005   Nonobstructive, <40%   TONSILLECTOMY Bilateral 1961    Allergies  Allergies  Allergen Reactions   Other     Other reaction(s): rash, sweat, nausea Other reaction(s): rash, sweat, nausea   Codeine Phosphate Other (See Comments)    REACTION: breathing difficulty   Penicillins Rash and Other (See Comments)    Break out in sweats/ Rash amoxicillin. Denies airway involvement Has patient had a PCN reaction causing immediate rash, facial/tongue/throat swelling, SOB or lightheadedness with hypotension: Yes Has patient had a PCN reaction causing severe rash involving mucus membranes or skin necrosis: Yes Has patient had a PCN reaction that required hospitalization No Has patient had a PCN reaction occurring within the last 10 years: No If all of the above answers are "NO", then may proceed with Cephalosporin u    History of Present Illness    Jaime Barnes is a 71 y.o.  male with a hx of hypertension, hyperlipidemia, nonobstructive CAD, moderate PAD, CKD 3, tobacco use, mild carotid stenosis last seen medication management.  Prior LHC 06/2004 with 40% LAD, 20-40% RCA and 30% LCx disease.  LVEF 50%.  Evaluated by PCP in 2016 for claudication.  Underwent ABI testing which was negative for occlusive disease.  Referred to cardiology due to suspicion for false negative.  Exercise ABIs normal by Doppler showed 30-49% SFA disease without focal stenosis.  Left peroneal was also occluded.  Repeat ABI 06/2019 were unchanged.  HCTZ-lisinopril previously switched to amlodipine due to worsening renal function.  Carotid Dopplers 2019 due to mild dizziness with mild stenosis bilaterally.  30-day event monitor with occasional PAC and PVC with 5 beat run of NSVT.  Exercise Myoview 06/2018 EF 47% and no ischemia.  Screening abdominal aortic ultrasound revealed 50 to 74% stenosis of the common iliac artery.  Abdominal aorta not dilated.  Last seen 02/06/2022.  Was working on cutting back on cigarettes and no longer smoking cigarettes.  No chest pain, edema, dyspnea.  He was referred to prep exercise program.  Blood pressure well controlled on amlodipine and metoprolol.  Presents today for follow-up independently after contacting the office requesting refill of lisinopril which he was no longer supposed to be taking.  Feeling well since last seen.  Enjoying participating the prep program at the [facility] and feels this has improved his  activity level.  BP checked routinely at home most often 130s with his upper arm cuff though often checking prior medications.  Still with mild exertional dyspnea which is unchanged and resolved by as needed albuterol.   EKGs/Labs/Other Studies Reviewed:   The following studies were reviewed today:    TTE 12/03/07: LVEF 55-65%.  Trace MR.   LHC 07/03/04: 40% mid LAD, 30% mid LCx, 40% mid RCA, 20% distal RCA.  LV gram LVEF 50%.   Exercise Myoview 12/03/07:  Exercised 7 minutes on Bruce protocol.  8 METS.  Adequate HR response.  No infarct or inducible ischemia.   30 day event monitor 04/28/18: Quality: Fair.  Baseline artifact. Predominant rhythm: sinus rhythm Average heart rate: 73 bpm Max heart rate: 151 bpm Min heart rate: 51 bpm   PACs and PVCs noted 5 beats NSVT   Carotid Doppler 04/30/18: 1-39% ICA stenosis biaterally   ABI 06/2018: 1.2 bilaterally   Lexiscan Myoview 06/2018:  LVEF 47%.  No ischemia.   ABI 07/20/2019: Right: Resting right ankle-brachial index is within normal range. No  evidence of significant right lower extremity arterial disease. The right  toe-brachial index is normal.   Left: Resting left ankle-brachial index is within normal range. No  evidence of significant left lower extremity arterial disease. The left  toe-brachial index is normal.   [place] AAA Duplex 09/13/2020: Abdominal Aorta: No evidence of an abdominal aortic aneurysm was  visualized. Right CIA 50-74% stenosis.    EKG:  No EKG today.   Recent Labs: 12/31/2021: BUN 14; Creatinine, Ser 1.21; Hemoglobin 10.8; Platelets 233.0; Potassium 4.5; Sodium 140; TSH 3.57 05/06/2022: ALT 13  Recent Lipid Panel    Component Value Date/Time   CHOL 124 05/06/2022 1011   TRIG 245 (H) 05/06/2022 1011   HDL 34 (L) 05/06/2022 1011   CHOLHDL 3.6 05/06/2022 1011   CHOLHDL 3 12/31/2021 0946   VLDL 19.6 12/31/2021 0946   LDLCALC 51 05/06/2022 1011   LDLDIRECT 156 (H) 06/09/2011 1555    Home Medications   Current Meds  Medication Sig   albuterol (VENTOLIN HFA) 108 (90 Base) MCG/ACT inhaler INHALE 1 TO 2 PUFFS INTO THE LUNGS EVERY 6 HOURS AS NEEDED FOR WHEEZING OR SHORTNESS OF BREATH   aspirin 81 MG chewable tablet Chew 81 mg by mouth daily.   Ferric Maltol (ACCRUFER) 30 MG CAPS Take 1 capsule by mouth in the morning and at bedtime.   gabapentin (NEURONTIN) 300 MG capsule TAKE 1 CAPSULE(300 MG) BY MOUTH TWICE DAILY   ketorolac (ACULAR) 0.4 % SOLN Place 1 drop  into the left eye 4 (four) times daily.   LORazepam (ATIVAN) 0.5 MG tablet 1-2 tabs 30 - 60 min prior to injection. Do not drive with this medicine.   MAGNESIUM-OXIDE 400 (240 Mg) MG tablet Take 1 tablet (400 mg total) by mouth daily.   ofloxacin (OCUFLOX) 0.3 % ophthalmic solution Place 1 drop into the left eye 4 (four) times daily.   omeprazole (PRILOSEC) 20 MG capsule Take 1 capsule (20 mg total) by mouth 2 (two) times daily before a meal.   polyethylene glycol (MIRALAX / GLYCOLAX) 17 g packet Take 17 g by mouth daily.   prednisoLONE acetate (PRED FORTE) 1 % ophthalmic suspension Place 1 drop into the left eye 4 (four) times daily.   risperiDONE (RISPERDAL) 1 MG tablet TAKE 1 TABLET(1 MG) BY MOUTH AT BEDTIME   tadalafil (CIALIS) 5 MG tablet SMARTSIG:1-4 Tablet(s) By Mouth Daily PRN   thiamine 100 MG  tablet Take 1 tablet (100 mg total) by mouth daily.   topiramate (TOPAMAX) 100 MG tablet Take 100 mg by mouth 2 (two) times daily.   traMADol (ULTRAM) 50 MG tablet TAKE 1 TABLET(50 MG) BY MOUTH EVERY 12 HOURS AS NEEDED   traZODone (DESYREL) 100 MG tablet TAKE 1/2 TO 1 TABLET(50 TO 100 MG) BY MOUTH AT BEDTIME   Ubrogepant (UBRELVY) 100 MG TABS TAKE 1 TABLET(100 MG) BY MOUTH 1 TIME EVERY 2 HOURS AS DIRECTED AS NEEDED. MAXIMUM 2 TABLETS IN 24 HOURS   valACYclovir (VALTREX) 500 MG tablet TAKE 1 TABLET(500 MG) BY MOUTH DAILY   [DISCONTINUED] amLODipine (NORVASC) 10 MG tablet TAKE 1 TABLET(10 MG) BY MOUTH DAILY   [DISCONTINUED] hydrochlorothiazide (MICROZIDE) 12.5 MG capsule Take 12.5 mg by mouth daily.   [DISCONTINUED] icosapent Ethyl (VASCEPA) 1 g capsule Take 2 capsules (2 g total) by mouth 2 (two) times daily.   [DISCONTINUED] rosuvastatin (CRESTOR) 20 MG tablet Take 1 tablet (20 mg total) by mouth daily.     Review of Systems      All other systems reviewed and are otherwise negative except as noted above.  Physical Exam    VS:  BP 114/62 (BP Location: Left Arm, Patient Position: Sitting,  Cuff Size: Normal)   Pulse 63   Ht '5\' 6"'$  (1.676 m)   Wt 173 lb 4.8 oz (78.6 kg)   SpO2 97%   BMI 27.97 kg/m  , BMI Body mass index is 27.97 kg/m.  Wt Readings from Last 3 Encounters:  06/09/22 173 lb 4.8 oz (78.6 kg)  06/04/22 174 lb 3.2 oz (79 kg)  04/28/22 172 lb (78 kg)     GEN: Well nourished, well developed, in no acute distress. HEENT: normal. Neck: Supple, no JVD, carotid bruits, or masses. Cardiac: RRR, no murmurs, rubs, or gallops. No clubbing, cyanosis, edema.  Radials/PT 2+ and equal bilaterally.  Respiratory:  Respirations regular and unlabored, clear to auscultation bilaterally. GI: Soft, nontender, nondistended. MS: No deformity or atrophy. Skin: Warm and dry, no rash. Neuro:  Strength and sensation are intact. Psych: Normal affect.  Assessment & Plan    Hypertension- BP well controlled. Continue current antihypertensive regimen Amlodipine '5mg'$  QD, HCTZ '25mg'$  QD. Refills provided.Previously discontinued HCTZ but had swelling and it was resumed. Previously on Lisinopril but was discontinued due to renal function and no indication to resume at this time - we will update his pharmacy so they do not refill Lisinopril as he has not been taking. Heart healthy diet and regular cardiovascular exercise encouraged.    CAD/hyperlipidemia, LDL goal less than 70 / PAD- Stable with no anginal symptoms. No indication for ischemic evaluation.  No claudication symptoms. GDMT aspirin, Vascepa, Rosuvastatin. Refills provided.   Tobacco use- Congratulated on quitting smoking cigarettes. Encouraged to continue to wean and eventually discontinue usee of cigars.         Disposition: Follow up  01/2023  with Skeet Latch, MD or APP.  Signed, Loel Dubonnet, NP 06/09/2022, 1:14 PM [place],

## 2022-06-13 DIAGNOSIS — H25811 Combined forms of age-related cataract, right eye: Secondary | ICD-10-CM | POA: Diagnosis not present

## 2022-06-20 ENCOUNTER — Ambulatory Visit (INDEPENDENT_AMBULATORY_CARE_PROVIDER_SITE_OTHER): Payer: Medicare Other | Admitting: Podiatry

## 2022-06-20 ENCOUNTER — Encounter: Payer: Self-pay | Admitting: Podiatry

## 2022-06-20 DIAGNOSIS — B351 Tinea unguium: Secondary | ICD-10-CM

## 2022-06-20 DIAGNOSIS — I739 Peripheral vascular disease, unspecified: Secondary | ICD-10-CM

## 2022-06-20 DIAGNOSIS — M79675 Pain in left toe(s): Secondary | ICD-10-CM

## 2022-06-20 DIAGNOSIS — N183 Chronic kidney disease, stage 3 unspecified: Secondary | ICD-10-CM | POA: Diagnosis not present

## 2022-06-20 DIAGNOSIS — M79674 Pain in right toe(s): Secondary | ICD-10-CM

## 2022-06-20 NOTE — Progress Notes (Signed)
This patient returns to my office for at risk foot care.  This patient requires this care by a professional since this patient will be at risk due to having chronic kidney disease  This patient is unable to cut nails himself since the patient cannot reach his nails.These nails are painful walking and wearing shoes.  This patient presents for at risk foot care today.  General Appearance  Alert, conversant and in no acute stress.  Vascular  Dorsalis pedis and posterior tibial  pulses are weakly  palpable  bilaterally.  Capillary return is within normal limits  bilaterally. Temperature is within normal limits  bilaterally.  Neurologic  Senn-Weinstein monofilament wire test within normal limits  bilaterally. Muscle power within normal limits bilaterally.  Nails Thick disfigured discolored nails with subungual debris  from hallux to fifth toes bilaterally. No evidence of bacterial infection or drainage bilaterally.  Orthopedic  No limitations of motion  feet .  No crepitus or effusions noted.  No bony pathology or digital deformities noted.  Skin  normotropic skin with no porokeratosis noted bilaterally.  No signs of infections or ulcers noted.     Onychomycosis  Pain in right toes  Pain in left toes  Consent was obtained for treatment procedures.   Mechanical debridement of nails 1-5  bilaterally performed with a nail nipper.  Filed with dremel without incident.    Return office visit   10 weeks                  Told patient to return for periodic foot care and evaluation due to potential at risk complications.   Damani Kelemen DPM  

## 2022-06-30 ENCOUNTER — Other Ambulatory Visit: Payer: Self-pay | Admitting: Internal Medicine

## 2022-06-30 ENCOUNTER — Other Ambulatory Visit: Payer: Self-pay | Admitting: Neurology

## 2022-06-30 DIAGNOSIS — E785 Hyperlipidemia, unspecified: Secondary | ICD-10-CM

## 2022-07-01 ENCOUNTER — Encounter: Payer: Self-pay | Admitting: Internal Medicine

## 2022-07-01 ENCOUNTER — Ambulatory Visit (INDEPENDENT_AMBULATORY_CARE_PROVIDER_SITE_OTHER): Payer: Medicare Other | Admitting: Internal Medicine

## 2022-07-01 VITALS — BP 132/76 | HR 69 | Temp 97.9°F | Ht 66.0 in | Wt 175.0 lb

## 2022-07-01 DIAGNOSIS — K2270 Barrett's esophagus without dysplasia: Secondary | ICD-10-CM | POA: Diagnosis not present

## 2022-07-01 DIAGNOSIS — D538 Other specified nutritional anemias: Secondary | ICD-10-CM | POA: Diagnosis not present

## 2022-07-01 DIAGNOSIS — M792 Neuralgia and neuritis, unspecified: Secondary | ICD-10-CM | POA: Diagnosis not present

## 2022-07-01 DIAGNOSIS — I739 Peripheral vascular disease, unspecified: Secondary | ICD-10-CM | POA: Insufficient documentation

## 2022-07-01 DIAGNOSIS — J418 Mixed simple and mucopurulent chronic bronchitis: Secondary | ICD-10-CM

## 2022-07-01 DIAGNOSIS — M19011 Primary osteoarthritis, right shoulder: Secondary | ICD-10-CM

## 2022-07-01 DIAGNOSIS — K2100 Gastro-esophageal reflux disease with esophagitis, without bleeding: Secondary | ICD-10-CM

## 2022-07-01 DIAGNOSIS — D510 Vitamin B12 deficiency anemia due to intrinsic factor deficiency: Secondary | ICD-10-CM | POA: Diagnosis not present

## 2022-07-01 DIAGNOSIS — D500 Iron deficiency anemia secondary to blood loss (chronic): Secondary | ICD-10-CM

## 2022-07-01 DIAGNOSIS — Z23 Encounter for immunization: Secondary | ICD-10-CM | POA: Diagnosis not present

## 2022-07-01 DIAGNOSIS — E519 Thiamine deficiency, unspecified: Secondary | ICD-10-CM | POA: Diagnosis not present

## 2022-07-01 DIAGNOSIS — N1832 Chronic kidney disease, stage 3b: Secondary | ICD-10-CM

## 2022-07-01 DIAGNOSIS — D508 Other iron deficiency anemias: Secondary | ICD-10-CM | POA: Diagnosis not present

## 2022-07-01 DIAGNOSIS — F319 Bipolar disorder, unspecified: Secondary | ICD-10-CM

## 2022-07-01 LAB — CBC WITH DIFFERENTIAL/PLATELET
Basophils Absolute: 0.1 K/uL (ref 0.0–0.1)
Basophils Relative: 1 % (ref 0.0–3.0)
Eosinophils Absolute: 0.2 K/uL (ref 0.0–0.7)
Eosinophils Relative: 1.6 % (ref 0.0–5.0)
HCT: 40 % (ref 39.0–52.0)
Hemoglobin: 13.8 g/dL (ref 13.0–17.0)
Lymphocytes Relative: 26.5 % (ref 12.0–46.0)
Lymphs Abs: 2.6 K/uL (ref 0.7–4.0)
MCHC: 34.6 g/dL (ref 30.0–36.0)
MCV: 94.8 fl (ref 78.0–100.0)
Monocytes Absolute: 1 K/uL (ref 0.1–1.0)
Monocytes Relative: 10 % (ref 3.0–12.0)
Neutro Abs: 6 K/uL (ref 1.4–7.7)
Neutrophils Relative %: 60.9 % (ref 43.0–77.0)
Platelets: 216 K/uL (ref 150.0–400.0)
RBC: 4.21 Mil/uL — ABNORMAL LOW (ref 4.22–5.81)
RDW: 14.2 % (ref 11.5–15.5)
WBC: 9.9 K/uL (ref 4.0–10.5)

## 2022-07-01 LAB — BASIC METABOLIC PANEL
BUN: 15 mg/dL (ref 6–23)
CO2: 23 mEq/L (ref 19–32)
Calcium: 9.7 mg/dL (ref 8.4–10.5)
Chloride: 108 mEq/L (ref 96–112)
Creatinine, Ser: 1.29 mg/dL (ref 0.40–1.50)
GFR: 55.61 mL/min — ABNORMAL LOW (ref >60.00)
Glucose, Bld: 92 mg/dL (ref 70–99)
Potassium: 4 mEq/L (ref 3.5–5.1)
Sodium: 141 mEq/L (ref 135–145)

## 2022-07-01 LAB — IBC + FERRITIN
Ferritin: 55 ng/mL (ref 22.0–322.0)
Iron: 78 ug/dL (ref 42–165)
Saturation Ratios: 22.5 % (ref 20.0–50.0)
TIBC: 347.2 ug/dL (ref 250.0–450.0)
Transferrin: 248 mg/dL (ref 212.0–360.0)

## 2022-07-01 LAB — FOLATE: Folate: 13.6 ng/mL (ref >5.9)

## 2022-07-01 MED ORDER — RISPERIDONE 1 MG PO TABS: ORAL_TABLET | ORAL | 1 refills | Status: DC

## 2022-07-01 MED ORDER — OMEPRAZOLE 20 MG PO CPDR: 20.0000 mg | DELAYED_RELEASE_CAPSULE | Freq: Two times a day (BID) | ORAL | 1 refills | Status: DC

## 2022-07-01 MED ORDER — TRAMADOL HCL 50 MG PO TABS: 50.0000 mg | ORAL_TABLET | Freq: Two times a day (BID) | ORAL | 2 refills | Status: DC | PRN

## 2022-07-01 MED ORDER — BREZTRI AEROSPHERE 160-9-4.8 MCG/ACT IN AERO: 2.0000 | INHALATION_SPRAY | Freq: Two times a day (BID) | RESPIRATORY_TRACT | 5 refills | Status: DC

## 2022-07-01 MED ORDER — THIAMINE HCL 100 MG PO TABS: 100.0000 mg | ORAL_TABLET | Freq: Every day | ORAL | 1 refills | Status: AC

## 2022-07-01 MED ORDER — ACCRUFER 30 MG PO CAPS: 1.0000 | ORAL_CAPSULE | Freq: Two times a day (BID) | ORAL | 1 refills | Status: AC

## 2022-07-01 NOTE — Progress Notes (Signed)
Subjective:  Patient ID: Jaime Barnes, male    DOB: 1951-05-19  Age: 71 y.o. MRN: [id number]  CC: COPD, Anemia, and Hypertension   HPI Jaime Barnes presents for f/up -  He is active and complains of B thigh pain with exercise. He denies CP, DOE, edema. He is using his albuterol inh TID.  Outpatient Medications Prior to Visit  Medication Sig Dispense Refill   albuterol (VENTOLIN HFA) 108 (90 Base) MCG/ACT inhaler INHALE 1 TO 2 PUFFS INTO THE LUNGS EVERY 6 HOURS AS NEEDED FOR WHEEZING OR SHORTNESS OF BREATH 18 g 5   amLODipine (NORVASC) 10 MG tablet TAKE 1 TABLET(10 MG) BY MOUTH DAILY 90 tablet 3   aspirin 81 MG chewable tablet Chew 81 mg by mouth daily.     gabapentin (NEURONTIN) 300 MG capsule TAKE 1 CAPSULE(300 MG) BY MOUTH TWICE DAILY 180 capsule 1   hydrochlorothiazide (MICROZIDE) 12.5 MG capsule Take 1 capsule (12.5 mg total) by mouth daily. 90 capsule 3   icosapent Ethyl (VASCEPA) 1 g capsule Take 2 capsules (2 g total) by mouth 2 (two) times daily. 360 capsule 3   ketorolac (ACULAR) 0.4 % SOLN Place 1 drop into the left eye 4 (four) times daily.     LORazepam (ATIVAN) 0.5 MG tablet 1-2 tabs 30 - 60 min prior to injection. Do not drive with this medicine. 4 tablet 0   MAGNESIUM-OXIDE 400 (240 Mg) MG tablet Take 1 tablet (400 mg total) by mouth daily. 90 tablet 3   ofloxacin (OCUFLOX) 0.3 % ophthalmic solution Place 1 drop into the left eye 4 (four) times daily.     polyethylene glycol (MIRALAX / GLYCOLAX) 17 g packet Take 17 g by mouth daily. 14 each 0   prednisoLONE acetate (PRED FORTE) 1 % ophthalmic suspension Place 1 drop into the left eye 4 (four) times daily.     rosuvastatin (CRESTOR) 20 MG tablet Take 1 tablet (20 mg total) by mouth daily. 90 tablet 3   tadalafil (CIALIS) 5 MG tablet SMARTSIG:1-4 Tablet(s) By Mouth Daily PRN     traZODone (DESYREL) 100 MG tablet TAKE 1/2 TO 1 TABLET(50 TO 100 MG) BY MOUTH AT BEDTIME 60 tablet 1   Ubrogepant (UBRELVY) 100 MG TABS TAKE 1  TABLET(100 MG) BY MOUTH 1 TIME EVERY 2 HOURS AS DIRECTED AS NEEDED. MAXIMUM 2 TABLETS IN 24 HOURS 16 tablet 5   valACYclovir (VALTREX) 500 MG tablet TAKE 1 TABLET(500 MG) BY MOUTH DAILY 90 tablet 1   Ferric Maltol (ACCRUFER) 30 MG CAPS Take 1 capsule by mouth in the morning and at bedtime. 180 capsule 1   omeprazole (PRILOSEC) 20 MG capsule Take 1 capsule (20 mg total) by mouth 2 (two) times daily before a meal. 180 capsule 1   risperiDONE (RISPERDAL) 1 MG tablet TAKE 1 TABLET(1 MG) BY MOUTH AT BEDTIME 90 tablet 1   thiamine 100 MG tablet Take 1 tablet (100 mg total) by mouth daily. 90 tablet 1   topiramate (TOPAMAX) 100 MG tablet Take 100 mg by mouth 2 (two) times daily.     traMADol (ULTRAM) 50 MG tablet TAKE 1 TABLET(50 MG) BY MOUTH EVERY 12 HOURS AS NEEDED 60 tablet 2   No facility-administered medications prior to visit.    ROS Review of Systems  Constitutional:  Negative for chills, diaphoresis, fatigue and fever.  HENT: Negative.    Eyes: Negative.   Respiratory:  Positive for wheezing. Negative for cough, chest tightness and shortness of breath.   Cardiovascular:  Negative for chest pain, palpitations and leg swelling.  Gastrointestinal:  Negative for abdominal pain, diarrhea, nausea and vomiting.  Endocrine: Negative.   Genitourinary: Negative.  Negative for difficulty urinating.  Musculoskeletal: Negative.   Skin: Negative.   Hematological:  Negative for adenopathy. Does not bruise/bleed easily.  Psychiatric/Behavioral: Negative.      Objective:  BP 132/76 (BP Location: Left Arm, Patient Position: Sitting, Cuff Size: Large)   Pulse 69   Temp 97.9 F (36.6 C) (Oral)   Ht '5\' 6"'$  (1.676 m)   Wt 175 lb (79.4 kg)   SpO2 96%   BMI 28.25 kg/m   BP Readings from Last 3 Encounters:  07/01/22 132/76  06/09/22 114/62  06/04/22 112/78    Wt Readings from Last 3 Encounters:  07/01/22 175 lb (79.4 kg)  06/09/22 173 lb 4.8 oz (78.6 kg)  06/04/22 174 lb 3.2 oz (79 kg)     Physical Exam Vitals reviewed.  HENT:     Nose: Nose normal.     Mouth/Throat:     Mouth: Mucous membranes are moist.  Eyes:     General: No scleral icterus.    Conjunctiva/sclera: Conjunctivae normal.  Cardiovascular:     Rate and Rhythm: Normal rate and regular rhythm.     Pulses:          Dorsalis pedis pulses are 1+ on the right side and 1+ on the left side.       Posterior tibial pulses are 0 on the right side and 0 on the left side.  Pulmonary:     Effort: Pulmonary effort is normal. No accessory muscle usage or respiratory distress.     Breath sounds: Examination of the right-middle field reveals rhonchi. Examination of the left-middle field reveals rhonchi. Examination of the right-lower field reveals rhonchi. Examination of the left-lower field reveals rhonchi. Rhonchi present. No decreased breath sounds, wheezing or rales.  Abdominal:     General: Abdomen is flat.     Palpations: There is no mass.     Tenderness: There is no abdominal tenderness. There is no guarding.     Hernia: No hernia is present.  Musculoskeletal:        General: Normal range of motion.     Cervical back: Neck supple.     Right lower leg: No edema.     Left lower leg: No edema.  Feet:     Right foot:     Skin integrity: Skin integrity normal.     Toenail Condition: Right toenails are abnormally thick.     Left foot:     Skin integrity: Skin integrity normal.     Toenail Condition: Left toenails are abnormally thick.  Skin:    General: Skin is warm and dry.  Neurological:     General: No focal deficit present.     Mental Status: He is alert.     Lab Results  Component Value Date   WBC 9.9 07/01/2022   HGB 13.8 07/01/2022   HCT 40.0 07/01/2022   PLT 216.0 07/01/2022   GLUCOSE 92 07/01/2022   CHOL 124 05/06/2022   TRIG 245 (H) 05/06/2022   HDL 34 (L) 05/06/2022   LDLDIRECT 156 (H) 06/09/2011   LDLCALC 51 05/06/2022   ALT 13 05/06/2022   AST 14 05/06/2022   NA 141 07/01/2022    K 4.0 07/01/2022   CL 108 07/01/2022   CREATININE 1.29 07/01/2022   BUN 15 07/01/2022   CO2 23 07/01/2022   TSH  3.57 12/31/2021   PSA 0.99 12/31/2021   INR 1.1 09/22/2021   HGBA1C 5.8 (H) 12/03/2020    US Abdomen Complete  Result Date: 05/26/2022 CLINICAL DATA:  Follow-up cirrhosis EXAM: ABDOMEN ULTRASOUND COMPLETE COMPARISON:  Abdominal ultrasound 11/18/2021 FINDINGS: Gallbladder: No gallstones or wall thickening visualized. No sonographic Murphy sign noted by sonographer. Common bile duct: Diameter: 0.3 cm, within normal limits Liver: No focal lesion identified. Coarsened echotexture with mildly nodular contour. Within normal limits in parenchymal echogenicity. Portal vein is patent on color Doppler imaging with normal direction of blood flow towards the liver. IVC: No abnormality visualized. Pancreas: Limited visualization. No definite abnormality identified. Spleen: Size and appearance within normal limits. Right Kidney: Length: 10.5 cm. Echogenicity within normal limits. No mass or hydronephrosis visualized. Left Kidney: Length: 10.9 cm. Echogenicity within normal limits. No hydronephrosis. There is a cyst measuring 1.8 cm. Abdominal aorta: No aneurysm visualized. Other findings: None. IMPRESSION: Appearance of the liver in keeping with history of cirrhosis. No new focal liver lesion identified. Electronically Signed   By: Audie Pinto M.D.   On: 05/26/2022 11:14    Assessment & Plan:   Jaime Barnes was seen today for copd, anemia and hypertension.  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 -     CBC with Differential/Platelet  Iron deficiency anemia secondary to inadequate dietary iron intake -     IBC + Ferritin; Future -     CBC with Differential/Platelet; Future -     Ferric Maltol (ACCRUFER) 30 MG CAPS; Take 1 capsule by mouth in the morning and at bedtime. -     CBC with Differential/Platelet -     IBC + Ferritin  Bipolar 1 disorder (HCC) -     risperiDONE (RISPERDAL) 1 MG tablet; TAKE 1 TABLET(1 MG) BY MOUTH AT BEDTIME  Neuropathic pain -     traMADol (ULTRAM) 50 MG tablet; Take 1 tablet (50 mg total) by mouth every 12 (twelve) hours as needed.  Primary osteoarthritis of right shoulder -     traMADol (ULTRAM) 50 MG tablet; Take 1 tablet (50 mg total) by mouth every 12 (twelve) hours as needed.  Barrett's esophagus without dysplasia -     omeprazole (PRILOSEC) 20 MG capsule; Take 1 capsule (20 mg total) by mouth 2 (two) times daily before a meal.  Gastroesophageal reflux disease with esophagitis without hemorrhage -     omeprazole (PRILOSEC) 20 MG capsule; Take 1 capsule (20 mg total) by mouth 2 (two) times daily before a meal.  Flu vaccine need -     Flu Vaccine QUAD High Dose(Fluad)  Claudication of both lower extremities (HCC) -  VAS [place] ABI WITH/WO TBI; Future  PAD (peripheral artery disease) (HCC) -     VAS [place] ABI WITH/WO TBI; Future  Mixed simple and mucopurulent chronic bronchitis ([place])- Will start a LAMA, LABA, ICS inhaler. -     Budeson-Glycopyrrol-Formoterol (BREZTRI AEROSPHERE) 160-9-4.8 MCG/ACT AERO; Inhale 2 puffs into the lungs 2 (two) times daily.  Other orders -     Td : Tetanus/diphtheria >7yo Preservative  free   I have changed Jaime Barnes's thiamine and traMADol. I am  also having him start on [employer]. Additionally, I am having him maintain his aspirin, tadalafil, LORazepam, polyethylene glycol, MAGnesium-Oxide, prednisoLONE acetate, ofloxacin, ketorolac, Ubrelvy, albuterol, traZODone, valACYclovir, gabapentin, rosuvastatin, icosapent Ethyl, hydrochlorothiazide, amLODipine, risperiDONE, omeprazole, and ACCRUFeR.  Meds ordered this encounter  Medications   thiamine (VITAMIN B1) 100 MG tablet    Sig: Take 1 tablet (100 mg total) by mouth daily.    Dispense:  90 tablet    Refill:  1   risperiDONE (RISPERDAL) 1 MG tablet    Sig: TAKE 1 TABLET(1 MG) BY MOUTH AT BEDTIME    Dispense:  90 tablet    Refill:  1   traMADol (ULTRAM) 50 MG tablet    Sig: Take 1 tablet (50 mg total) by mouth every 12 (twelve) hours as needed.    Dispense:  60 tablet    Refill:  2   omeprazole (PRILOSEC) 20 MG capsule    Sig: Take 1 capsule (20 mg total) by mouth 2 (two) times daily before a meal.    Dispense:  180 capsule    Refill:  1   Ferric Maltol (ACCRUFER) 30 MG CAPS    Sig: Take 1 capsule by mouth in the morning and at bedtime.    Dispense:  180 capsule    Refill:  1   Budeson-Glycopyrrol-Formoterol (BREZTRI AEROSPHERE) 160-9-4.8 MCG/ACT AERO    Sig: Inhale 2 puffs into the lungs 2 (two) times daily.    Dispense:  10.7 g    Refill:  5     Follow-up: Return in about 3 months (around 10/01/2022).  Scarlette Calico, MD

## 2022-07-01 NOTE — Patient Instructions (Signed)

## 2022-07-07 NOTE — Progress Notes (Deleted)
NEUROLOGY FOLLOW UP OFFICE NOTE  Jaime Barnes [id number]  Assessment/Plan:   1  Migraine without aura, without status migrainosus, not intractable 2  New onset headache - paroxysmal circumscribed left parietal headache unclear etiology   1.  For further evaluation of new headache, check CTA head to evaluate for secondary etiology such as aneurysm. Migraine prevention:  topiramate '100mg'$  twice daily.   2.  Migraine rescue:  Ubrelvy '100mg'$  3.  Limit use of pain relievers to no more than 2 days out of week to prevent risk of rebound or medication-overuse headache. 4.  Keep headache diary 5.  Follow up 6 months  Subjective:  Jaime Barnes is a 71 year old right-handed man with hypertension, bipolar disorder, CKD, tobacco use, Barrett's esophagus and cirrhosis who follows up for migraine.   UPDATE: CTA of head on 01/07/2022 personally reviewed showed mild atherosclerosis in the intracrranial ICAs but overall unremarkable.    Intensity:  10/10 Duration:  Within 30 minutes with Ubrelvy Frequency:  Usually 3 times a month.  Last month had 5.     A couple of months ago, he started getting a   Current NSAIDS:  none Current analgesics:  none Current triptans: none Current anti-emetic:  none Current muscle relaxants:  none Current Antihypertensive medications:  Lisinopril '10mg'$ , verapamil '120mg'$  three times daily, metoprolol Current Antidepressant medications:  none Current Anticonvulsant medications: topiramate '100mg'$  twice daily, gabapentin '300mg'$  twice daily Current anti-CGRP:  Ubrelvy '100mg'$    HISTORY: Onset:  Has remote history of migraines but they returned in 2016 after experiencing increased stress related to his ex-girlfriend. Location:  Right sided Quality:  pounding Initial Intensity:  "50"/10 Aura:  no Prodrome:  no Associated symptoms: Photophobia, phonophobia.  No nausea or visual disturbance Initial Duration:  Until takes medication Initial Frequency:  every other  day Triggers:  Emotional stress Relieving factors: None Activity:  Cannot function  Also may get a squeezing/throbbing pain in his left parietal region last 3-4 minutes.  Squeezing and rubbing head over it and takes a tizanidine helps ease it. They occur once a week.     Past abortive medication:  ibuprofen, naproxen, Tylenol, Excedrin, sumatriptan (effective but contraindicated due to cardiac history) Past preventative medication:  none Other past therapy:  none   CT of head from 07/19/15 was unremarkable.    MRI of brain from 12/04/14 showed global atrophy and mild small vessel ischemic changes. 06/23/16:  Sed rate 25  PAST MEDICAL HISTORY: Past Medical History:  Diagnosis Date   Barrett's esophagus 2011   Recommend repeat EGD for surveillance in 2 yrs-[facility]   Bipolar disorder ([place])    Chronic kidney disease    Cirrhosis ([place])    Diverticulosis of colon    GANGLION CYST, TENDON SHEATH 06/26/2010   Hyperlipidemia    Hypertension    Hypertriglyceridemia 01/21/2019   Impotence    Migraine    Pancreatitis    Pulmonary nodule    Pulmonary nodule seen on imaging study 02/06/2011   Followup CT in December 2014 with 20 months of stability at 4 mm.  No further imaging recommended     MEDICATIONS: Current Outpatient Medications on File Prior to Visit  Medication Sig Dispense Refill   albuterol (VENTOLIN HFA) 108 (90 Base) MCG/ACT inhaler INHALE 1 TO 2 PUFFS INTO THE LUNGS EVERY 6 HOURS AS NEEDED FOR WHEEZING OR SHORTNESS OF BREATH 18 g 5   amLODipine (NORVASC) 10 MG tablet TAKE 1 TABLET(10 MG) BY MOUTH DAILY 90 tablet 3  aspirin 81 MG chewable tablet Chew 81 mg by mouth daily.     Budeson-Glycopyrrol-Formoterol (BREZTRI AEROSPHERE) 160-9-4.8 MCG/ACT AERO Inhale 2 puffs into the lungs 2 (two) times daily. 10.7 g 5   Ferric Maltol (ACCRUFER) 30 MG CAPS Take 1 capsule by mouth in the morning and at bedtime. 180 capsule 1   gabapentin (NEURONTIN) 300 MG capsule TAKE 1 CAPSULE(300 MG) BY  MOUTH TWICE DAILY 180 capsule 1   hydrochlorothiazide (MICROZIDE) 12.5 MG capsule Take 1 capsule (12.5 mg total) by mouth daily. 90 capsule 3   icosapent Ethyl (VASCEPA) 1 g capsule Take 2 capsules (2 g total) by mouth 2 (two) times daily. 360 capsule 3   ketorolac (ACULAR) 0.4 % SOLN Place 1 drop into the left eye 4 (four) times daily.     LORazepam (ATIVAN) 0.5 MG tablet 1-2 tabs 30 - 60 min prior to injection. Do not drive with this medicine. 4 tablet 0   MAGNESIUM-OXIDE 400 (240 Mg) MG tablet Take 1 tablet (400 mg total) by mouth daily. 90 tablet 3   ofloxacin (OCUFLOX) 0.3 % ophthalmic solution Place 1 drop into the left eye 4 (four) times daily.     omeprazole (PRILOSEC) 20 MG capsule Take 1 capsule (20 mg total) by mouth 2 (two) times daily before a meal. 180 capsule 1   polyethylene glycol (MIRALAX / GLYCOLAX) 17 g packet Take 17 g by mouth daily. 14 each 0   prednisoLONE acetate (PRED FORTE) 1 % ophthalmic suspension Place 1 drop into the left eye 4 (four) times daily.     risperiDONE (RISPERDAL) 1 MG tablet TAKE 1 TABLET(1 MG) BY MOUTH AT BEDTIME 90 tablet 1   rosuvastatin (CRESTOR) 20 MG tablet Take 1 tablet (20 mg total) by mouth daily. 90 tablet 3   tadalafil (CIALIS) 5 MG tablet SMARTSIG:1-4 Tablet(s) By Mouth Daily PRN     thiamine (VITAMIN B1) 100 MG tablet Take 1 tablet (100 mg total) by mouth daily. 90 tablet 1   topiramate (TOPAMAX) 100 MG tablet TAKE 1 TABLET(100 MG) BY MOUTH TWICE DAILY 60 tablet 5   traMADol (ULTRAM) 50 MG tablet Take 1 tablet (50 mg total) by mouth every 12 (twelve) hours as needed. 60 tablet 2   traZODone (DESYREL) 100 MG tablet TAKE 1/2 TO 1 TABLET(50 TO 100 MG) BY MOUTH AT BEDTIME 60 tablet 1   Ubrogepant (UBRELVY) 100 MG TABS TAKE 1 TABLET(100 MG) BY MOUTH 1 TIME EVERY 2 HOURS AS DIRECTED AS NEEDED. MAXIMUM 2 TABLETS IN 24 HOURS 16 tablet 5   valACYclovir (VALTREX) 500 MG tablet TAKE 1 TABLET(500 MG) BY MOUTH DAILY 90 tablet 1   No current  facility-administered medications on file prior to visit.    ALLERGIES: Allergies  Allergen Reactions   Other     Other reaction(s): rash, sweat, nausea Other reaction(s): rash, sweat, nausea   Codeine Phosphate Other (See Comments)    REACTION: breathing difficulty   Penicillins Rash and Other (See Comments)    Break out in sweats/ Rash amoxicillin. Denies airway involvement Has patient had a PCN reaction causing immediate rash, facial/tongue/throat swelling, SOB or lightheadedness with hypotension: Yes Has patient had a PCN reaction causing severe rash involving mucus membranes or skin necrosis: Yes Has patient had a PCN reaction that required hospitalization No Has patient had a PCN reaction occurring within the last 10 years: No If all of the above answers are "NO", then may proceed with Cephalosporin u    FAMILY HISTORY: Family  History  Problem Relation Age of Onset   Anemia Mother    Hypertension Daughter    Diabetes Daughter    Migraines Daughter       Objective:  *** General: No acute distress.  Patient appears well-groomed.   Head:  Normocephalic/atraumatic Eyes:  Fundi examined but not visualized Back: No paraspinal tenderness Neurological Exam: ***   Metta Clines, DO  CC: Scarlette Calico, MD

## 2022-07-09 ENCOUNTER — Ambulatory Visit: Payer: Medicare Other | Admitting: Neurology

## 2022-07-09 ENCOUNTER — Encounter: Payer: Self-pay | Admitting: Neurology

## 2022-07-09 ENCOUNTER — Ambulatory Visit (INDEPENDENT_AMBULATORY_CARE_PROVIDER_SITE_OTHER): Payer: Medicare Other | Admitting: *Deleted

## 2022-07-09 DIAGNOSIS — E538 Deficiency of other specified B group vitamins: Secondary | ICD-10-CM | POA: Diagnosis not present

## 2022-07-09 DIAGNOSIS — Z029 Encounter for administrative examinations, unspecified: Secondary | ICD-10-CM

## 2022-07-09 MED ORDER — CYANOCOBALAMIN 1000 MCG/ML IJ SOLN
1000.0000 ug | Freq: Once | INTRAMUSCULAR | Status: AC
  Administered 2022-07-09: 1000 ug via INTRAMUSCULAR

## 2022-07-09 NOTE — Progress Notes (Addendum)
Administered B12 shot right deltoid. Pt tolerated well. 

## 2022-07-11 ENCOUNTER — Ambulatory Visit (HOSPITAL_COMMUNITY)
Admission: RE | Admit: 2022-07-11 | Discharge: 2022-07-11 | Disposition: A | Discharge status: Home / Self Care | Payer: Medicare Other | Source: Ambulatory Visit | Attending: Cardiology | Admitting: Cardiology

## 2022-07-11 DIAGNOSIS — I739 Peripheral vascular disease, unspecified: Secondary | ICD-10-CM | POA: Diagnosis not present

## 2022-07-18 ENCOUNTER — Other Ambulatory Visit: Payer: Self-pay | Admitting: Internal Medicine

## 2022-07-18 DIAGNOSIS — B009 Herpesviral infection, unspecified: Secondary | ICD-10-CM

## 2022-07-18 DIAGNOSIS — F5104 Psychophysiologic insomnia: Secondary | ICD-10-CM

## 2022-07-23 DIAGNOSIS — H35351 Cystoid macular degeneration, right eye: Secondary | ICD-10-CM | POA: Diagnosis not present

## 2022-08-11 ENCOUNTER — Ambulatory Visit (INDEPENDENT_AMBULATORY_CARE_PROVIDER_SITE_OTHER): Payer: Medicare Other

## 2022-08-11 DIAGNOSIS — D510 Vitamin B12 deficiency anemia due to intrinsic factor deficiency: Secondary | ICD-10-CM

## 2022-08-11 MED ORDER — CYANOCOBALAMIN 1000 MCG/ML IJ SOLN
1000.0000 ug | Freq: Once | INTRAMUSCULAR | Status: AC
  Administered 2022-08-11: 1000 ug via INTRAMUSCULAR

## 2022-08-11 NOTE — Progress Notes (Signed)
Pt was given B12 injection w/o any complications, questions and or concerns.

## 2022-08-13 ENCOUNTER — Telehealth: Payer: Self-pay | Admitting: Internal Medicine

## 2022-08-13 NOTE — Telephone Encounter (Signed)
N/A unable to leave a  message for patient to call back to schedule Medicare Annual Wellness Visit   Last AWV  08/20/21  Please schedule at anytime with [facility] if patient calls the office back.     Any questions, please call me at [phone]

## 2022-08-25 DIAGNOSIS — H35351 Cystoid macular degeneration, right eye: Secondary | ICD-10-CM | POA: Diagnosis not present

## 2022-08-26 ENCOUNTER — Telehealth: Payer: Self-pay

## 2022-08-26 NOTE — Telephone Encounter (Signed)
Called patient to complete AWV; voicemail full; If no return call within 15 minutes, patient may reschedule for the next available appointment with [facility] or CMA. -S. Kalene Cutler,LPN

## 2022-08-29 ENCOUNTER — Other Ambulatory Visit (HOSPITAL_BASED_OUTPATIENT_CLINIC_OR_DEPARTMENT_OTHER): Payer: Self-pay | Admitting: Cardiovascular Disease

## 2022-08-29 ENCOUNTER — Ambulatory Visit (INDEPENDENT_AMBULATORY_CARE_PROVIDER_SITE_OTHER): Payer: Medicare Other | Admitting: Podiatry

## 2022-08-29 ENCOUNTER — Encounter: Payer: Self-pay | Admitting: Podiatry

## 2022-08-29 DIAGNOSIS — N183 Chronic kidney disease, stage 3 unspecified: Secondary | ICD-10-CM

## 2022-08-29 DIAGNOSIS — B351 Tinea unguium: Secondary | ICD-10-CM

## 2022-08-29 DIAGNOSIS — M79674 Pain in right toe(s): Secondary | ICD-10-CM | POA: Diagnosis not present

## 2022-08-29 DIAGNOSIS — M79675 Pain in left toe(s): Secondary | ICD-10-CM

## 2022-08-29 DIAGNOSIS — I739 Peripheral vascular disease, unspecified: Secondary | ICD-10-CM

## 2022-08-29 NOTE — Telephone Encounter (Signed)
Rx request sent to pharmacy.  

## 2022-08-29 NOTE — Progress Notes (Signed)
This patient returns to my office for at risk foot care.  This patient requires this care by a professional since this patient will be at risk due to having chronic kidney disease  This patient is unable to cut nails himself since the patient cannot reach his nails.These nails are painful walking and wearing shoes.  This patient presents for at risk foot care today.  General Appearance  Alert, conversant and in no acute stress.  Vascular  Dorsalis pedis and posterior tibial  pulses are weakly  palpable  bilaterally.  Capillary return is within normal limits  bilaterally. Temperature is within normal limits  bilaterally.  Neurologic  Senn-Weinstein monofilament wire test within normal limits  bilaterally. Muscle power within normal limits bilaterally.  Nails Thick disfigured discolored nails with subungual debris  from hallux to fifth toes bilaterally. No evidence of bacterial infection or drainage bilaterally.  Orthopedic  No limitations of motion  feet .  No crepitus or effusions noted.  No bony pathology or digital deformities noted.  Skin  normotropic skin with no porokeratosis noted bilaterally.  No signs of infections or ulcers noted.     Onychomycosis  Pain in right toes  Pain in left toes  Consent was obtained for treatment procedures.   Mechanical debridement of nails 1-5  bilaterally performed with a nail nipper.  Filed with dremel without incident.    Return office visit   10 weeks                  Told patient to return for periodic foot care and evaluation due to potential at risk complications.   Evolet Salminen DPM  

## 2022-09-01 ENCOUNTER — Ambulatory Visit (INDEPENDENT_AMBULATORY_CARE_PROVIDER_SITE_OTHER): Payer: Medicare Other

## 2022-09-01 VITALS — BP 140/70 | HR 90 | Temp 98.8°F | Ht 66.0 in | Wt 178.0 lb

## 2022-09-01 DIAGNOSIS — Z0000 Encounter for general adult medical examination without abnormal findings: Secondary | ICD-10-CM

## 2022-09-01 NOTE — Patient Instructions (Addendum)
[name] , Thank you for taking time to come for your Medicare Wellness Visit. I appreciate your ongoing commitment to your health goals. Please review the following plan we discussed and let me know if I can assist you in the future.   These are the goals we discussed:  Goals       Get back to playing tennis.      Request referral to psychiatry (mental health) (pt-stated)      Care Coordination Interventions: Reviewed medications with patient and discussed purpose of all medications Collaborated with Dr. Ronnald Ramp regarding pt's request for a psychiatry with mental health services Reviewed scheduled/upcoming provider appointments including AWV 07/01/2022 also reviewed all other pending appointments. Care Guide referral for Mental health agency listings Screening for signs and symptoms of depression related to chronic disease state  Assessed social determinant of health barriers          This is a list of the screening recommended for you and due dates:  Health Maintenance  Topic Date Due   Zoster (Shingles) Vaccine (2 of 2) 02/07/2021   COVID-19 Vaccine (4 - 2023-24 season) 05/30/2022   Medicare Annual Wellness Visit  08/20/2022   Cologuard (Stool DNA test)  03/30/2023   DTaP/Tdap/Td vaccine (3 - Td or Tdap) 07/01/2032   Pneumonia Vaccine  Completed   Flu Shot  Completed   Hepatitis C Screening: USPSTF Recommendation to screen - Ages 18-79 yo.  Completed   HPV Vaccine  Aged Out   Colon Cancer Screening  Discontinued    Advanced directives: Yes  Conditions/risks identified: Yes  Next appointment: Follow up in one year for your annual wellness visit.   Preventive Care 35 Years and Older, Male  Preventive care refers to lifestyle choices and visits with your health care provider that can promote health and wellness. What does preventive care include? A yearly physical exam. This is also called an annual well check. Dental exams once or twice a year. Routine eye exams. Ask  your health care provider how often you should have your eyes checked. Personal lifestyle choices, including: Daily care of your teeth and gums. Regular physical activity. Eating a healthy diet. Avoiding tobacco and drug use. Limiting alcohol use. Practicing safe sex. Taking low doses of aspirin every day. Taking vitamin and mineral supplements as recommended by your health care provider. What happens during an annual well check? The services and screenings done by your health care provider during your annual well check will depend on your age, overall health, lifestyle risk factors, and family history of disease. Counseling  Your health care provider may ask you questions about your: Alcohol use. Tobacco use. Drug use. Emotional well-being. Home and relationship well-being. Sexual activity. Eating habits. History of falls. Memory and ability to understand (cognition). Work and work Statistician. Screening  You may have the following tests or measurements: Height, weight, and BMI. Blood pressure. Lipid and cholesterol levels. These may be checked every 5 years, or more frequently if you are over 57 years old. Skin check. Lung cancer screening. You may have this screening every year starting at age 15 if you have a 30-pack-year history of smoking and currently smoke or have quit within the past 15 years. Fecal occult blood test (FOBT) of the stool. You may have this test every year starting at age 54. Flexible sigmoidoscopy or colonoscopy. You may have a sigmoidoscopy every 5 years or a colonoscopy every 10 years starting at age 72. Prostate cancer screening. Recommendations will vary  depending on your family history and other risks. Hepatitis C blood test. Hepatitis B blood test. Sexually transmitted disease (STD) testing. Diabetes screening. This is done by checking your blood sugar (glucose) after you have not eaten for a while (fasting). You may have this done every 1-3  years. Abdominal aortic aneurysm (AAA) screening. You may need this if you are a current or former smoker. Osteoporosis. You may be screened starting at age 72 if you are at high risk. Talk with your health care provider about your test results, treatment options, and if necessary, the need for more tests. Vaccines  Your health care provider may recommend certain vaccines, such as: Influenza vaccine. This is recommended every year. Tetanus, diphtheria, and acellular pertussis (Tdap, Td) vaccine. You may need a Td booster every 10 years. Zoster vaccine. You may need this after age 72. Pneumococcal 13-valent conjugate (PCV13) vaccine. One dose is recommended after age 95. Pneumococcal polysaccharide (PPSV23) vaccine. One dose is recommended after age 72. Talk to your health care provider about which screenings and vaccines you need and how often you need them. This information is not intended to replace advice given to you by your health care provider. Make sure you discuss any questions you have with your health care provider. Document Released: 10/12/2015 Document Revised: 06/04/2016 Document Reviewed: 07/17/2015 Elsevier Interactive Patient Education  2017 [employer] Prevention in the Home Falls can cause injuries. They can happen to people of all ages. There are many things you can do to make your home safe and to help prevent falls. What can I do on the outside of my home? Regularly fix the edges of walkways and driveways and fix any cracks. Remove anything that might make you trip as you walk through a door, such as a raised step or threshold. Trim any bushes or trees on the path to your home. Use bright outdoor lighting. Clear any walking paths of anything that might make someone trip, such as rocks or tools. Regularly check to see if handrails are loose or broken. Make sure that both sides of any steps have handrails. Any raised decks and porches should have guardrails on the  edges. Have any leaves, snow, or ice cleared regularly. Use sand or salt on walking paths during winter. Clean up any spills in your garage right away. This includes oil or grease spills. What can I do in the bathroom? Use night lights. Install grab bars by the toilet and in the tub and shower. Do not use towel bars as grab bars. Use non-skid mats or decals in the tub or shower. If you need to sit down in the shower, use a plastic, non-slip stool. Keep the floor dry. Clean up any water that spills on the floor as soon as it happens. Remove soap buildup in the tub or shower regularly. Attach bath mats securely with double-sided non-slip rug tape. Do not have throw rugs and other things on the floor that can make you trip. What can I do in the bedroom? Use night lights. Make sure that you have a light by your bed that is easy to reach. Do not use any sheets or blankets that are too big for your bed. They should not hang down onto the floor. Have a firm chair that has side arms. You can use this for support while you get dressed. Do not have throw rugs and other things on the floor that can make you trip. What can I do in the  kitchen? Clean up any spills right away. Avoid walking on wet floors. Keep items that you use a lot in easy-to-reach places. If you need to reach something above you, use a strong step stool that has a grab bar. Keep electrical cords out of the way. Do not use floor polish or wax that makes floors slippery. If you must use wax, use non-skid floor wax. Do not have throw rugs and other things on the floor that can make you trip. What can I do with my stairs? Do not leave any items on the stairs. Make sure that there are handrails on both sides of the stairs and use them. Fix handrails that are broken or loose. Make sure that handrails are as long as the stairways. Check any carpeting to make sure that it is firmly attached to the stairs. Fix any carpet that is loose or  worn. Avoid having throw rugs at the top or bottom of the stairs. If you do have throw rugs, attach them to the floor with carpet tape. Make sure that you have a light switch at the top of the stairs and the bottom of the stairs. If you do not have them, ask someone to add them for you. What else can I do to help prevent falls? Wear shoes that: Do not have high heels. Have rubber bottoms. Are comfortable and fit you well. Are closed at the toe. Do not wear sandals. If you use a stepladder: Make sure that it is fully opened. Do not climb a closed stepladder. Make sure that both sides of the stepladder are locked into place. Ask someone to hold it for you, if possible. Clearly mark and make sure that you can see: Any grab bars or handrails. First and last steps. Where the edge of each step is. Use tools that help you move around (mobility aids) if they are needed. These include: Canes. Walkers. Scooters. Crutches. Turn on the lights when you go into a dark area. Replace any light bulbs as soon as they burn out. Set up your furniture so you have a clear path. Avoid moving your furniture around. If any of your floors are uneven, fix them. If there are any pets around you, be aware of where they are. Review your medicines with your doctor. Some medicines can make you feel dizzy. This can increase your chance of falling. Ask your doctor what other things that you can do to help prevent falls. This information is not intended to replace advice given to you by your health care provider. Make sure you discuss any questions you have with your health care provider. Document Released: 07/12/2009 Document Revised: 02/21/2016 Document Reviewed: 10/20/2014 Elsevier Interactive Patient Education  2017 [employer].

## 2022-09-01 NOTE — Progress Notes (Signed)
Subjective:   Jaime Barnes is a 71 y.o. male who presents for Medicare Annual/Subsequent preventive examination.  Review of Systems     Cardiac Risk Factors include: advanced age (>59mn, >>33women);dyslipidemia;hypertension;male gender;smoking/ tobacco exposure;Other (see comment), Risk factor comments: cigars     Objective:    Today's Vitals   09/01/22 1300  BP: (!) 140/70  Pulse: 90  Temp: 98.8 F (37.1 C)  SpO2: 91%  Weight: 178 lb (80.7 kg)  Height: '5\' 6"'$  (1.676 m)  PainSc: 0-No pain   Body mass index is 28.73 kg/m.     09/01/2022    1:09 PM 09/20/2021    6:52 PM 07/16/2021   10:19 AM 04/02/2021    1:31 PM 11/05/2020    9:19 AM 04/03/2020   10:07 AM 02/20/2020    9:05 AM  Advanced Directives  Does Patient Have a Medical Advance Directive? Yes No No No No No Yes  Type of AParamedicof ADanbyLiving will      [place] Does patient want to make changes to medical advance directive?       No - Patient declined  Copy of [place]? No - copy requested      Yes - validated most recent copy scanned in chart (See row information)  Would patient like information on creating a medical advance directive?  No - Patient declined  No - Patient declined  Yes (MAU/Ambulatory/Procedural Areas - Information given)     Current Medications (verified) Outpatient Encounter Medications as of 09/01/2022  Medication Sig   albuterol (VENTOLIN HFA) 108 (90 Base) MCG/ACT inhaler INHALE 1 TO 2 PUFFS INTO THE LUNGS EVERY 6 HOURS AS NEEDED FOR WHEEZING OR SHORTNESS OF BREATH   amLODipine (NORVASC) 10 MG tablet TAKE 1 TABLET(10 MG) BY MOUTH DAILY   aspirin 81 MG chewable tablet Chew 81 mg by mouth daily.   Budeson-Glycopyrrol-Formoterol (BREZTRI AEROSPHERE) 160-9-4.8 MCG/ACT AERO Inhale 2 puffs into the lungs 2 (two) times daily.   Ferric Maltol (ACCRUFER) 30 MG CAPS Take 1 capsule by mouth in the morning and at bedtime.    gabapentin (NEURONTIN) 300 MG capsule TAKE 1 CAPSULE(300 MG) BY MOUTH TWICE DAILY   hydrochlorothiazide (MICROZIDE) 12.5 MG capsule Take 1 capsule (12.5 mg total) by mouth daily.   icosapent Ethyl (VASCEPA) 1 g capsule Take 2 capsules (2 g total) by mouth 2 (two) times daily.   ketorolac (ACULAR) 0.4 % SOLN Place 1 drop into the left eye 4 (four) times daily.   LORazepam (ATIVAN) 0.5 MG tablet 1-2 tabs 30 - 60 min prior to injection. Do not drive with this medicine.   magnesium oxide (MAG-OX) 400 (240 Mg) MG tablet TAKE 1 TABLET(400 MG) BY MOUTH DAILY   ofloxacin (OCUFLOX) 0.3 % ophthalmic solution Place 1 drop into the left eye 4 (four) times daily.   omeprazole (PRILOSEC) 20 MG capsule Take 1 capsule (20 mg total) by mouth 2 (two) times daily before a meal.   polyethylene glycol (MIRALAX / GLYCOLAX) 17 g packet Take 17 g by mouth daily.   prednisoLONE acetate (PRED FORTE) 1 % ophthalmic suspension Place 1 drop into the left eye 4 (four) times daily.   risperiDONE (RISPERDAL) 1 MG tablet TAKE 1 TABLET(1 MG) BY MOUTH AT BEDTIME   rosuvastatin (CRESTOR) 20 MG tablet Take 1 tablet (20 mg total) by mouth daily.   tadalafil (CIALIS) 5 MG tablet SMARTSIG:1-4 Tablet(s) By Mouth Daily PRN  thiamine (VITAMIN B1) 100 MG tablet Take 1 tablet (100 mg total) by mouth daily.   topiramate (TOPAMAX) 100 MG tablet TAKE 1 TABLET(100 MG) BY MOUTH TWICE DAILY   traMADol (ULTRAM) 50 MG tablet Take 1 tablet (50 mg total) by mouth every 12 (twelve) hours as needed.   traZODone (DESYREL) 100 MG tablet TAKE 1/2 TO 1 TABLET(50 TO 100 MG) BY MOUTH AT BEDTIME   Ubrogepant (UBRELVY) 100 MG TABS TAKE 1 TABLET(100 MG) BY MOUTH 1 TIME EVERY 2 HOURS AS DIRECTED AS NEEDED. MAXIMUM 2 TABLETS IN 24 HOURS   valACYclovir (VALTREX) 500 MG tablet TAKE 1 TABLET(500 MG) BY MOUTH DAILY   No facility-administered encounter medications on file as of 09/01/2022.    Allergies (verified) Other, Codeine phosphate, and Penicillins    History: Past Medical History:  Diagnosis Date   Barrett's esophagus 2011   Recommend repeat EGD for surveillance in 2 yrs-[facility]   Bipolar disorder ([place])    Chronic kidney disease    Cirrhosis ([place])    Diverticulosis of colon    GANGLION CYST, TENDON SHEATH 06/26/2010   Hyperlipidemia    Hypertension    Hypertriglyceridemia 01/21/2019   Impotence    Migraine    Pancreatitis    Pulmonary nodule    Pulmonary nodule seen on imaging study 02/06/2011   Followup CT in December 2014 with 20 months of stability at 4 mm.  No further imaging recommended    Past Surgical History:  Procedure Laterality Date   CARDIAC CATHETERIZATION  2005   Nonobstructive, <40%   TONSILLECTOMY Bilateral 1961   Family History  Problem Relation Age of Onset   Anemia Mother    Hypertension Daughter    Diabetes Daughter    Migraines Daughter    Social History   Socioeconomic History   Marital status: Single    Spouse name: Not on file   Number of children: 8   Years of education: 8   Highest education level: 8th grade  Occupational History   Not on file  Tobacco Use   Smoking status: Light Smoker    Types: Cigars   Smokeless tobacco: Former    Quit date: 03/08/2017   Tobacco comments:    cigar occassionally   Vaping Use   Vaping Use: Never used  Substance and Sexual Activity   Alcohol use: No    Comment: Last drink "30 years ago"   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    Sexual activity: Yes    Birth control/protection: Condom    Comment: Multiple sex partners. 60 in last year.  Other Topics Concern   Not on file  Social History Narrative   Pt lives alone. No stairs in home. Pt has completed 8th grade.       Caffeine 3 cups coffee day      Exercise - yes walks and plays tennis   Social Determinants of Health   Financial Resource Strain: Low Risk  (09/01/2022)   Overall Financial Resource Strain (CARDIA)    Difficulty of Paying Living  Expenses: Not hard at all  Food Insecurity: No Food Insecurity (09/01/2022)   Hunger Vital Sign    Worried About Running Out of Food in the Last Year: Never true    Ran Out of Food in the Last Year: Never true  Transportation Needs: No Transportation Needs (09/01/2022)   PRAPARE - Hydrologist (Medical): No  Lack of Transportation (Non-Medical): No  Physical Activity: Sufficiently Active (09/01/2022)   Exercise Vital Sign    Days of Exercise per Week: 7 days    Minutes of Exercise per Session: 30 min  Stress: No Stress Concern Present (09/01/2022)   [place]    Feeling of Stress : Not at all  Social Connections: [place] (09/01/2022)   Social Connection and Isolation Panel [NHANES]    Frequency of Communication with Friends and Family: More than three times a week    Frequency of Social Gatherings with Friends and Family: More than three times a week    Attends Religious Services: 1 to 4 times per year    Active Member of [employer] or Organizations: Yes    Attends Archivist Meetings: Never    Marital Status: Living with partner    Tobacco Counseling Ready to quit: Not Answered Counseling given: Not Answered Tobacco comments: cigar occassionally    Clinical Intake:  Pre-visit preparation completed: Yes  Pain : No/denies pain Pain Score: 0-No pain     Nutritional Risks: None Diabetes: No  How often do you need to have someone help you when you read instructions, pamphlets, or other written materials from your doctor or pharmacy?: 1 - Never What is the last grade level you completed in school?: 8th grade  Diabetic? No  Interpreter Needed?: No  Information entered by :: Lisette Abu, LPN.   Activities of Daily Living    09/01/2022    1:30 PM  In your present state of health, do you have any difficulty performing the following activities:  Hearing? 0   Vision? 0  Difficulty concentrating or making decisions? 0  Walking or climbing stairs? 0  Dressing or bathing? 0  Doing errands, shopping? 0  Preparing Food and eating ? N  Using the Toilet? N  In the past six months, have you accidently leaked urine? N  Do you have problems with loss of bowel control? N  Managing your Medications? N  Managing your Finances? N  Housekeeping or managing your Housekeeping? N    Patient Care Team: Janith Lima, MD as PCP - General (Internal Medicine) Skeet Latch, MD as PCP - Cardiology (Cardiology) Lovena Neighbours, DMD (Dentistry) Skeet Latch, MD as Attending Physician (Cardiology) Wilford Corner, MD as Consulting Physician (Gastroenterology) Pieter Partridge, DO as Consulting Physician (Neurology) [facility] as Consulting Physician (Ophthalmology)  Indicate any recent Medical Services you may have received from other than [facility] providers in the past year (date may be approximate).     Assessment:   This is a routine wellness examination for [place].  Hearing/Vision screen Hearing Screening - Comments:: Denies hearing difficulties   Vision Screening - Comments:: No glasses - up to date with routine eye exams with [facility] [place]   Dietary issues and exercise activities discussed: Current Exercise Habits: Home exercise routine;Structured exercise class, Type of exercise: walking;treadmill;stretching;strength training/weights;exercise ball;Other - see comments (tennis), Time (Minutes): 30, Frequency (Times/Week): 7, Weekly Exercise (Minutes/Week): 210, Exercise limited by: None identified   Goals Addressed             This Visit's Progress    Get back to playing tennis.        Depression Screen    09/01/2022    1:29 PM 05/07/2022    2:07 PM 08/20/2021    9:28 AM 12/03/2020    2:05 PM 09/06/2020    1:01 PM  08/09/2020    7:58 AM 03/12/2020    9:39 AM  PHQ 2/9 Scores  PHQ - 2 Score 0 3 0 0 0 0 0  PHQ- 9  Score  9         Fall Risk    09/01/2022    1:30 PM 08/20/2021    9:25 AM 04/05/2021   10:50 AM 12/03/2020    2:05 PM 11/05/2020    9:18 AM  Fall Risk   Falls in the past year? 0 0 1 0 0  Number falls in past yr: 0 0 1 0 0  Injury with Fall? 0 0 0 0 0  Risk for fall due to : No Fall Risks No Fall Risks     Follow up Falls prevention discussed Falls evaluation completed  Falls evaluation completed     FALL RISK PREVENTION PERTAINING TO THE HOME:  Any stairs in or around the home? No  If so, are there any without handrails? No  Home free of loose throw rugs in walkways, pet beds, electrical cords, etc? Yes  Adequate lighting in your home to reduce risk of falls? Yes   ASSISTIVE DEVICES UTILIZED TO PREVENT FALLS:  Life alert? No  Use of a cane, walker or w/c? No  Grab bars in the bathroom? Yes  Shower chair or bench in shower? No  Elevated toilet seat or a handicapped toilet? Yes   TIMED UP AND GO:  Was the test performed? Yes .  Length of time to ambulate 10 feet: 8 sec.   Gait steady and fast without use of assistive device  Cognitive Function:        09/01/2022    1:01 PM 02/20/2020    9:05 AM 02/14/2019    9:33 AM  6CIT Screen  What Year? 0 points 0 points 0 points  What month? 0 points 0 points 0 points  What time? 0 points  0 points  Count back from 20 0 points  0 points  Months in reverse 0 points 4 points 0 points  Repeat phrase 0 points  2 points  Total Score 0 points  2 points    Immunizations Immunization History  Administered Date(s) Administered   Fluad Quad(high Dose 65+) 08/15/2019, 07/11/2021, 07/01/2022   Influenza Split 08/24/2012   Influenza Whole 09/02/2007, 06/23/2008, 06/09/2011   Influenza, High Dose Seasonal PF 06/27/2017, 06/25/2018   Influenza,inj,Quad PF,6+ Mos 07/29/2013, 06/11/2020   Influenza-Unspecified 08/24/2012, 08/29/2016, 06/27/2017   PFIZER(Purple Top)SARS-COV-2 Vaccination 12/30/2019, 01/23/2020, 10/10/2020   Pneumococcal  Conjugate-13 03/18/2016   Pneumococcal Polysaccharide-23 03/27/2017   Td 07/01/2022   Tdap 06/09/2011   Zoster Recombinat (Shingrix) 12/13/2020    TDAP status: Up to date  Flu Vaccine status: Up to date  Pneumococcal vaccine status: Up to date  Covid-19 vaccine status: Completed vaccines  Qualifies for Shingles Vaccine? Yes   Zostavax completed No   Shingrix Completed?: No.    Education has been provided regarding the importance of this vaccine. Patient has been advised to call insurance company to determine out of pocket expense if they have not yet received this vaccine. Advised may also receive vaccine at local pharmacy or Health Dept. Verbalized acceptance and understanding.  Screening Tests Health Maintenance  Topic Date Due   Zoster Vaccines- Shingrix (2 of 2) 02/07/2021   COVID-19 Vaccine (4 - 2023-24 season) 05/30/2022   Fecal DNA (Cologuard)  03/30/2023   Medicare Annual Wellness (AWV)  09/02/2023   DTaP/Tdap/Td (3 - Td or Tdap) 07/01/2032  Pneumonia Vaccine 61+ Years old  Completed   INFLUENZA VACCINE  Completed   Hepatitis C Screening  Completed   HPV VACCINES  Aged Out   COLONOSCOPY (Pts 45-39yr Insurance coverage will need to be confirmed)  Discontinued    Health Maintenance  Health Maintenance Due  Topic Date Due   Zoster Vaccines- Shingrix (2 of 2) 02/07/2021   COVID-19 Vaccine (4 - 2023-24 season) 05/30/2022    Colorectal cancer screening: Type of screening: Cologuard. Completed 04/04/2020. Repeat every 3 years  Lung Cancer Screening: (Low Dose CT Chest recommended if Age 227-80years, 30 pack-year currently smoking OR have quit w/in 15years.) does not qualify.   Lung Cancer Screening Referral: no  Additional Screening:  Hepatitis C Screening: does qualify; Completed 01/07/2017  Vision Screening: Recommended annual ophthalmology exams for early detection of glaucoma and other disorders of the eye. Is the patient up to date with their annual eye exam?   Yes  Who is the provider or what is the name of the office in which the patient attends annual eye exams[facility] If pt is not established with a provider, would they like to be referred to a provider to establish care? No .   Dental Screening: Recommended annual dental exams for proper oral hygiene  Community Resource Referral / Chronic Care Management: CRR required this visit?  No   CCM required this visit?  No      Plan:     I have personally reviewed and noted the following in the patient's chart:   Medical and social history Use of alcohol, tobacco or illicit drugs  Current medications and supplements including opioid prescriptions. Patient is not currently taking opioid prescriptions. Functional ability and status Nutritional status Physical activity Advanced directives List of other physicians Hospitalizations, surgeries, and ER visits in previous 12 months Vitals Screenings to include cognitive, depression, and falls Referrals and appointments  In addition, I have reviewed and discussed with patient certain preventive protocols, quality metrics, and best practice recommendations. A written personalized care plan for preventive services as well as general preventive health recommendations were provided to patient.     SSheral Flow LPN   [id number]  Nurse Notes:  Normal cognitive status assessed by direct observation by this Nurse Health Advisor. No abnormalities found.

## 2022-09-11 ENCOUNTER — Ambulatory Visit (INDEPENDENT_AMBULATORY_CARE_PROVIDER_SITE_OTHER): Payer: Medicare Other | Admitting: *Deleted

## 2022-09-11 ENCOUNTER — Other Ambulatory Visit: Payer: Self-pay | Admitting: Internal Medicine

## 2022-09-11 DIAGNOSIS — D510 Vitamin B12 deficiency anemia due to intrinsic factor deficiency: Secondary | ICD-10-CM | POA: Diagnosis not present

## 2022-09-11 DIAGNOSIS — F319 Bipolar disorder, unspecified: Secondary | ICD-10-CM

## 2022-09-11 DIAGNOSIS — E538 Deficiency of other specified B group vitamins: Secondary | ICD-10-CM

## 2022-09-11 MED ORDER — CYANOCOBALAMIN 1000 MCG/ML IJ SOLN
1000.0000 ug | Freq: Once | INTRAMUSCULAR | Status: AC
  Administered 2022-09-11: 1000 ug via INTRAMUSCULAR

## 2022-09-11 NOTE — Progress Notes (Signed)
Patient is here for his B12 injection. Given in right deltoid. Patient tolerated well

## 2022-10-01 ENCOUNTER — Encounter: Payer: Self-pay | Admitting: Internal Medicine

## 2022-10-01 ENCOUNTER — Ambulatory Visit (INDEPENDENT_AMBULATORY_CARE_PROVIDER_SITE_OTHER): Payer: Medicare Other

## 2022-10-01 ENCOUNTER — Other Ambulatory Visit: Payer: Self-pay | Admitting: Internal Medicine

## 2022-10-01 ENCOUNTER — Ambulatory Visit (INDEPENDENT_AMBULATORY_CARE_PROVIDER_SITE_OTHER): Payer: Medicare Other | Admitting: Internal Medicine

## 2022-10-01 VITALS — BP 122/72 | HR 70 | Temp 97.8°F | Ht 66.0 in | Wt 179.0 lb

## 2022-10-01 DIAGNOSIS — F5104 Psychophysiologic insomnia: Secondary | ICD-10-CM

## 2022-10-01 DIAGNOSIS — M19011 Primary osteoarthritis, right shoulder: Secondary | ICD-10-CM

## 2022-10-01 DIAGNOSIS — M792 Neuralgia and neuritis, unspecified: Secondary | ICD-10-CM | POA: Diagnosis not present

## 2022-10-01 DIAGNOSIS — W19XXXA Unspecified fall, initial encounter: Secondary | ICD-10-CM

## 2022-10-01 DIAGNOSIS — M25552 Pain in left hip: Secondary | ICD-10-CM

## 2022-10-01 DIAGNOSIS — W19XXXD Unspecified fall, subsequent encounter: Secondary | ICD-10-CM

## 2022-10-01 MED ORDER — TRAZODONE HCL 100 MG PO TABS
ORAL_TABLET | ORAL | 1 refills | Status: DC
Start: 2022-10-01 — End: 2022-12-15

## 2022-10-01 MED ORDER — TRAMADOL HCL 50 MG PO TABS: 50.0000 mg | ORAL_TABLET | Freq: Two times a day (BID) | ORAL | 2 refills | Status: DC | PRN

## 2022-10-01 NOTE — Progress Notes (Signed)
Subjective:  Patient ID: Jaime Barnes, male    DOB: [birth year]  Age: 72 y.o. MRN: [id number]  CC: Osteoarthritis and COPD   HPI Azhar Knope presents for f/up -  He fell 2 months ago and continues to complain of pain in his left hip.  He is taking tramadol for the pain.  He is active and denies chest pain, shortness of breath, diaphoresis, or hemoptysis.  Outpatient Medications Prior to Visit  Medication Sig Dispense Refill   albuterol (VENTOLIN HFA) 108 (90 Base) MCG/ACT inhaler INHALE 1 TO 2 PUFFS INTO THE LUNGS EVERY 6 HOURS AS NEEDED FOR WHEEZING OR SHORTNESS OF BREATH 18 g 5   amLODipine (NORVASC) 10 MG tablet TAKE 1 TABLET(10 MG) BY MOUTH DAILY 90 tablet 3   aspirin 81 MG chewable tablet Chew 81 mg by mouth daily.     Budeson-Glycopyrrol-Formoterol (BREZTRI AEROSPHERE) 160-9-4.8 MCG/ACT AERO Inhale 2 puffs into the lungs 2 (two) times daily. 10.7 g 5   Ferric Maltol (ACCRUFER) 30 MG CAPS Take 1 capsule by mouth in the morning and at bedtime. 180 capsule 1   gabapentin (NEURONTIN) 300 MG capsule TAKE 1 CAPSULE(300 MG) BY MOUTH TWICE DAILY 180 capsule 1   hydrochlorothiazide (MICROZIDE) 12.5 MG capsule Take 1 capsule (12.5 mg total) by mouth daily. 90 capsule 3   icosapent Ethyl (VASCEPA) 1 g capsule Take 2 capsules (2 g total) by mouth 2 (two) times daily. 360 capsule 3   ketorolac (ACULAR) 0.4 % SOLN Place 1 drop into the left eye 4 (four) times daily.     LORazepam (ATIVAN) 0.5 MG tablet 1-2 tabs 30 - 60 min prior to injection. Do not drive with this medicine. 4 tablet 0   magnesium oxide (MAG-OX) 400 (240 Mg) MG tablet TAKE 1 TABLET(400 MG) BY MOUTH DAILY 90 tablet 1   ofloxacin (OCUFLOX) 0.3 % ophthalmic solution Place 1 drop into the left eye 4 (four) times daily.     omeprazole (PRILOSEC) 20 MG capsule Take 1 capsule (20 mg total) by mouth 2 (two) times daily before a meal. 180 capsule 1   polyethylene glycol (MIRALAX / GLYCOLAX) 17 g packet Take 17 g by mouth daily. 14 each 0    prednisoLONE acetate (PRED FORTE) 1 % ophthalmic suspension Place 1 drop into the left eye 4 (four) times daily.     risperiDONE (RISPERDAL) 1 MG tablet TAKE 1 TABLET(1 MG) BY MOUTH AT BEDTIME 90 tablet 0   rosuvastatin (CRESTOR) 20 MG tablet Take 1 tablet (20 mg total) by mouth daily. 90 tablet 3   tadalafil (CIALIS) 5 MG tablet SMARTSIG:1-4 Tablet(s) By Mouth Daily PRN     thiamine (VITAMIN B1) 100 MG tablet Take 1 tablet (100 mg total) by mouth daily. 90 tablet 1   topiramate (TOPAMAX) 100 MG tablet TAKE 1 TABLET(100 MG) BY MOUTH TWICE DAILY 60 tablet 5   Ubrogepant (UBRELVY) 100 MG TABS TAKE 1 TABLET(100 MG) BY MOUTH 1 TIME EVERY 2 HOURS AS DIRECTED AS NEEDED. MAXIMUM 2 TABLETS IN 24 HOURS 16 tablet 5   valACYclovir (VALTREX) 500 MG tablet TAKE 1 TABLET(500 MG) BY MOUTH DAILY 90 tablet 1   traMADol (ULTRAM) 50 MG tablet Take 1 tablet (50 mg total) by mouth every 12 (twelve) hours as needed. 60 tablet 2   traZODone (DESYREL) 100 MG tablet TAKE 1/2 TO 1 TABLET(50 TO 100 MG) BY MOUTH AT BEDTIME 60 tablet 1   No facility-administered medications prior to visit.    ROS Review  of Systems  Constitutional:  Negative for diaphoresis and fatigue.  HENT: Negative.    Eyes: Negative.   Respiratory:  Positive for shortness of breath. Negative for cough, chest tightness and wheezing.   Cardiovascular:  Negative for chest pain, palpitations and leg swelling.  Gastrointestinal:  Negative for abdominal pain, diarrhea, nausea and vomiting.  Endocrine: Negative.   Genitourinary:  Negative for difficulty urinating.  Musculoskeletal:  Positive for arthralgias and back pain. Negative for gait problem, joint swelling and myalgias.  Skin: Negative.   Neurological: Negative.  Negative for dizziness and weakness.  Hematological:  Negative for adenopathy. Does not bruise/bleed easily.  Psychiatric/Behavioral:  Positive for sleep disturbance. Negative for dysphoric mood. The patient is not nervous/anxious.      Objective:  BP 122/72 (BP Location: Left Arm, Patient Position: Sitting, Cuff Size: Large)   Pulse 70   Temp 97.8 F (36.6 C) (Oral)   Ht '5\' 6"'$  (1.676 m)   Wt 179 lb (81.2 kg)   SpO2 98%   BMI 28.89 kg/m   BP Readings from Last 3 Encounters:  10/01/22 122/72  09/01/22 (!) 140/70  07/01/22 132/76    Wt Readings from Last 3 Encounters:  10/01/22 179 lb (81.2 kg)  09/01/22 178 lb (80.7 kg)  07/01/22 175 lb (79.4 kg)    Physical Exam Vitals reviewed.  Constitutional:      General: He is not in acute distress.    Appearance: He is not ill-appearing, toxic-appearing or diaphoretic.  HENT:     Mouth/Throat:     Mouth: Mucous membranes are moist.  Eyes:     General: No scleral icterus.    Conjunctiva/sclera: Conjunctivae normal.  Cardiovascular:     Rate and Rhythm: Normal rate and regular rhythm.     Heart sounds: No murmur heard. Pulmonary:     Effort: Pulmonary effort is normal.     Breath sounds: No stridor. Examination of the right-upper field reveals wheezing and rhonchi. Examination of the left-upper field reveals wheezing and rhonchi. Examination of the right-middle field reveals wheezing and rhonchi. Examination of the left-middle field reveals wheezing and rhonchi. Examination of the right-lower field reveals wheezing and rhonchi. Examination of the left-lower field reveals wheezing and rhonchi. Wheezing and rhonchi present. No rales.     Comments: Diffuse inspiratory wheezes and rhonchi. Abdominal:     General: Abdomen is flat.     Palpations: There is no mass.     Tenderness: There is no abdominal tenderness. There is no guarding.     Hernia: No hernia is present.  Musculoskeletal:        General: Normal range of motion.     Cervical back: Neck supple. No tenderness.     Thoracic back: Normal.     Lumbar back: Normal. No bony tenderness. Normal range of motion. Negative right straight leg raise test and negative left straight leg raise test.     Right hip:  Normal. No deformity or tenderness. Normal range of motion.     Left hip: Normal. No deformity or tenderness. Normal range of motion.  Lymphadenopathy:     Cervical: No cervical adenopathy.  Skin:    General: Skin is warm and dry.     Findings: No rash.  Neurological:     General: No focal deficit present.     Mental Status: He is alert.  Psychiatric:        Mood and Affect: Mood normal.        Behavior: Behavior normal.  Lab Results  Component Value Date   WBC 9.9 07/01/2022   HGB 13.8 07/01/2022   HCT 40.0 07/01/2022   PLT 216.0 07/01/2022   GLUCOSE 92 07/01/2022   CHOL 124 05/06/2022   TRIG 245 (H) 05/06/2022   HDL 34 (L) 05/06/2022   LDLDIRECT 156 (H) 06/09/2011   LDLCALC 51 05/06/2022   ALT 13 05/06/2022   AST 14 05/06/2022   NA 141 07/01/2022   K 4.0 07/01/2022   CL 108 07/01/2022   CREATININE 1.29 07/01/2022   BUN 15 07/01/2022   CO2 23 07/01/2022   TSH 3.57 12/31/2021   PSA 0.99 12/31/2021   INR 1.1 09/22/2021   HGBA1C 5.8 (H) 12/03/2020    VAS [place] ABI WITH/WO TBI  Result Date: 07/12/2022  LOWER EXTREMITY DOPPLER STUDY Patient Name:  IRVING BLOOR  Date of Exam:   07/11/2022 Medical Rec #: [id number]    Accession #:    [id number] Date of Birth: 08-Feb-1951   Patient Gender: M Patient Age:   29 years Exam Location:  [facility] Procedure:      VAS [place] ABI WITH/WO TBI Referring Phys: Scarlette Calico --------------------------------------------------------------------------------  Indications: Peripheral artery disease. Patient reports some claudication in his              legs when walking, he can usually go about 2 laps around the [facility]              track before he has to stop and rest. High Risk Factors: Hypertension, hyperlipidemia, current smoker, coronary artery                    disease. 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  Comparison Study: Previous ABIs performed 07/20/19 were 1.10 on the right and                    1.11 on the left. Performing Technologist: Mariane Masters RVT  Examination Guidelines: A complete evaluation includes at minimum, Doppler waveform signals and systolic blood pressure reading at the level of bilateral brachial, anterior tibial, and posterior tibial arteries, when vessel segments are accessible. Bilateral testing is considered an integral part of a complete examination. Photoelectric Plethysmograph (PPG) waveforms and toe systolic pressure readings are included as required and additional duplex testing as needed. Limited examinations for reoccurring indications may be performed as noted.  ABI Findings: +---------+------------------+-----+----------+--------+ Right    Rt Pressure (mmHg)IndexWaveform  Comment  +---------+------------------+-----+----------+--------+ Brachial 144                                       +---------+------------------+-----+----------+--------+ PTA      142               0.99 triphasic          +---------+------------------+-----+----------+--------+ PERO     147               1.02 biphasic           +---------+------------------+-----+----------+--------+ DP       138               0.96 monophasic         +---------+------------------+-----+----------+--------+ Great Toe106               0.74 Normal             +---------+------------------+-----+----------+--------+ +---------+------------------+-----+-----------+-------+  Left     Lt Pressure (mmHg)IndexWaveform   Comment +---------+------------------+-----+-----------+-------+ Brachial 144                                       +---------+------------------+-----+-----------+-------+ PTA      149               1.03 triphasic          +---------+------------------+-----+-----------+-------+ PERO     124               0.86 multiphasic        +---------+------------------+-----+-----------+-------+ DP       143               0.99 biphasic            +---------+------------------+-----+-----------+-------+ Great Toe104               0.72 Normal             +---------+------------------+-----+-----------+-------+ +-------+-----------+-----------+------------+------------+ ABI/TBIToday's ABIToday's TBIPrevious ABIPrevious TBI +-------+-----------+-----------+------------+------------+ Right  1.02       0.74       1.10        0.86         +-------+-----------+-----------+------------+------------+ Left   1.03       0.72       1.11        0.83         +-------+-----------+-----------+------------+------------+ Bilateral ABIs appear essentially unchanged compared to prior study on 07/20/19.  Summary: Right: Resting right ankle-brachial index is within normal range. The right toe-brachial index is normal. Left: Resting left ankle-brachial index is within normal range. The left toe-brachial index is normal. *See table(s) above for measurements and observations.  Electronically signed by Jenkins Rouge MD on 07/12/2022 at 4:20:19 PM.    Final    DG HIP UNILAT WITH PELVIS 2-3 VIEWS LEFT  Result Date: 10/01/2022 CLINICAL DATA:  [place]. Left hip pain. EXAM: DG HIP (WITH OR WITHOUT PELVIS) 2-3V LEFT COMPARISON:  CT scan 07/16/2021 FINDINGS: Both hips are normally located. No significant degenerative changes or plain film evidence of AVN. No acute hip fracture. The pubic symphysis and SI joints are intact. No pelvic fractures are identified. Advanced vascular calcifications are noted. IMPRESSION: No acute bony findings. Electronically Signed   By: Marijo Sanes M.D.   On: 10/01/2022 09:18   VAS [place] ABI WITH/WO TBI  Result Date: 07/12/2022  LOWER EXTREMITY DOPPLER STUDY Patient Name:  SELDON BARRELL  Date of Exam:   07/11/2022 Medical Rec #: [id number]    Accession #:    [id number] Date of Birth: 1951/06/22   Patient Gender: M Patient Age:   5 years Exam Location:  [facility] Procedure:      VAS [place] ABI WITH/WO TBI Referring Phys: Scarlette Calico  --------------------------------------------------------------------------------  Indications: Peripheral artery disease. Patient reports some claudication in his              legs when walking, he can usually go about 2 laps around the [facility]              track before he has to stop and rest. High Risk Factors: Hypertension, hyperlipidemia, current smoker, coronary artery                    disease. 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  Comparison Study: Previous ABIs performed 07/20/19 were 1.10 on the right and                   1.11 on the left. Performing Technologist: Mariane Masters RVT  Examination Guidelines: A complete evaluation includes at minimum, Doppler waveform signals and systolic blood pressure reading at the level of bilateral brachial, anterior tibial, and posterior tibial arteries, when vessel segments are accessible. Bilateral testing is considered an integral part of a complete examination. Photoelectric Plethysmograph (PPG) waveforms and toe systolic pressure readings are included as required and additional duplex testing as needed. Limited examinations for reoccurring indications may be performed as noted.  ABI Findings: +---------+------------------+-----+----------+--------+ Right    Rt Pressure (mmHg)IndexWaveform  Comment  +---------+------------------+-----+----------+--------+ Brachial 144                                       +---------+------------------+-----+----------+--------+ PTA      142               0.99 triphasic          +---------+------------------+-----+----------+--------+ PERO     147               1.02 biphasic           +---------+------------------+-----+----------+--------+ DP       138               0.96 monophasic         +---------+------------------+-----+----------+--------+ Great Toe106               0.74 Normal              +---------+------------------+-----+----------+--------+ +---------+------------------+-----+-----------+-------+ Left     Lt Pressure (mmHg)IndexWaveform   Comment +---------+------------------+-----+-----------+-------+ Brachial 144                                       +---------+------------------+-----+-----------+-------+ PTA      149               1.03 triphasic          +---------+------------------+-----+-----------+-------+ PERO     124               0.86 multiphasic        +---------+------------------+-----+-----------+-------+ DP       143               0.99 biphasic           +---------+------------------+-----+-----------+-------+ Great Toe104               0.72 Normal             +---------+------------------+-----+-----------+-------+ +-------+-----------+-----------+------------+------------+ ABI/TBIToday's ABIToday's TBIPrevious ABIPrevious TBI +-------+-----------+-----------+------------+------------+ Right  1.02       0.74       1.10        0.86         +-------+-----------+-----------+------------+------------+ Left   1.03       0.72       1.11        0.83         +-------+-----------+-----------+------------+------------+ Bilateral ABIs appear essentially unchanged compared to prior study on 07/20/19.  Summary: Right: Resting right ankle-brachial index is within normal range. The right toe-brachial index is normal. Left: Resting left ankle-brachial index is within normal range. The left  toe-brachial index is normal. *See table(s) above for measurements and observations.  Electronically signed by Jenkins Rouge MD on 07/12/2022 at 4:20:19 PM.    Final      Assessment & Plan:   Daryon was seen today for osteoarthritis and copd.  Diagnoses and all orders for this visit:  Acute hip pain, left- Plain films are negative for fracture. -     Cancel: DG Hip Unilat W OR W/O Pelvis Min 4 Views Left; Future  Primary osteoarthritis of right  shoulder -     traMADol (ULTRAM) 50 MG tablet; Take 1 tablet (50 mg total) by mouth every 12 (twelve) hours as needed.  Neuropathic pain -     traMADol (ULTRAM) 50 MG tablet; Take 1 tablet (50 mg total) by mouth every 12 (twelve) hours as needed.  Psychophysiological insomnia -     traZODone (DESYREL) 100 MG tablet; TAKE 1/2 TO 1 TABLET(50 TO 100 MG) BY MOUTH AT BEDTIME  Fall, initial encounter -     Cancel: DG Hip Unilat W OR W/O Pelvis Min 4 Views Left; Future   I am having [name] maintain his aspirin, tadalafil, LORazepam, polyethylene glycol, prednisoLONE acetate, ofloxacin, ketorolac, Ubrelvy, albuterol, gabapentin, rosuvastatin, icosapent Ethyl, hydrochlorothiazide, amLODipine, topiramate, thiamine, omeprazole, ACCRUFeR, Breztri Aerosphere, valACYclovir, magnesium oxide, risperiDONE, traMADol, and traZODone.  Meds ordered this encounter  Medications   traMADol (ULTRAM) 50 MG tablet    Sig: Take 1 tablet (50 mg total) by mouth every 12 (twelve) hours as needed.    Dispense:  60 tablet    Refill:  2   traZODone (DESYREL) 100 MG tablet    Sig: TAKE 1/2 TO 1 TABLET(50 TO 100 MG) BY MOUTH AT BEDTIME    Dispense:  60 tablet    Refill:  1     Follow-up: Return in about 3 months (around 12/31/2022).  Scarlette Calico, MD

## 2022-10-01 NOTE — Patient Instructions (Signed)
Chronic Obstructive Pulmonary Disease  Chronic obstructive pulmonary disease (COPD) is a long-term (chronic) condition that affects the lungs. COPD is a general term that can be used to describe many different lung problems that cause lung inflammation and limit airflow, including chronic bronchitis and emphysema. If you have COPD, your lung function will probably never return to normal. In most cases, it gets worse over time. However, there are steps you can take to slow the progression of the disease and improve your quality of life. What are the causes? This condition may be caused by: Smoking. This is the most common cause. Certain genes passed down through families. What increases the risk? The following factors may make you more likely to develop this condition: Being exposed to secondhand smoke from cigarettes, pipes, or cigars. Being exposed to chemicals and other irritants, such as fumes and dust in the work environment. Having chronic lung conditions or infections. What are the signs or symptoms? Symptoms of this condition include: Shortness of breath, especially during physical activity. Chronic cough with a large amount of thick mucus. Sometimes, the cough may not have any mucus (dry cough). Wheezing and rapid breathing. Gray or bluish discoloration (cyanosis) of the skin, especially in the fingers, toes, or lips. Feeling tired (fatigue). Weight loss. Chest tightness. Frequent infections. Episodes when breathing symptoms become much worse (exacerbations). At the later stages of this disease, you may have swelling in the ankles, feet, or legs. How is this diagnosed? This condition is diagnosed based on: Your medical history. A physical exam. You may also have tests, including: Lung (pulmonary) function tests. This may include a spirometry test, which measures your ability to exhale properly. Chest X-ray. CT scan. Blood tests. How is this treated? This condition may be  treated with: Medicines. These may include inhaled rescue medicines to treat acute exacerbations as well as medicines that you take long-term (maintenance medicines) to prevent flare-ups of COPD. Bronchodilators help treat COPD by dilating the airways to allow increased airflow and make your breathing more comfortable. Steroids can reduce airway inflammation and help prevent exacerbations. Smoking cessation. If you smoke, your health care provider may ask you to quit, and may also recommend therapy or replacement products to help you quit. Pulmonary rehabilitation. This may involve working with a team of health care providers and specialists, such as respiratory, occupational, and physical therapists. Exercise and physical activity. These are beneficial for nearly all people with COPD. Nutrition therapy to gain weight, if you are underweight. Oxygen. Supplemental oxygen therapy is only helpful if you have a low oxygen level in your blood (hypoxemia). Lung surgery or transplant. Palliative care. This is to help people with COPD feel comfortable when treatment is no longer working. Follow these instructions at home: Medicines Take over-the-counter and prescription medicines only as told by your health care provider. This includes inhaled medicines and pills. Talk to your health care provider before taking any cough or allergy medicines. You may need to avoid certain medicines that dry out your airways. Lifestyle If you smoke, the most important thing that you can do is to stop smoking. Continuing to smoke will cause the disease to progress faster. Do not use any products that contain nicotine or tobacco. These products include cigarettes, chewing tobacco, and vaping devices, such as e-cigarettes. If you need help quitting, ask your health care provider. Avoid exposure to things that irritate your lungs, such as smoke, chemicals, and fumes. Stay active, but balance activity with periods of rest.  Exercise and   physical activity will help you maintain your ability to do things you want to do. Learn and use relaxation techniques to manage stress and to control your breathing. Get the right amount of sleep and get quality sleep. Most adults need 7 or more hours per night. Eat healthy foods. Eating smaller, more frequent meals and resting before meals may help you maintain your strength. Controlled breathing Learn and use controlled breathing techniques as directed by your health care provider. Controlled breathing techniques include: Pursed lip breathing. Start by breathing in (inhaling) through your nose for 1 second. Then, purse your lips as if you were going to whistle and breathe out (exhale) through the pursed lips for 2 seconds. Diaphragmatic breathing. Start by putting one hand on your abdomen just above your waist. Inhale slowly through your nose. The hand on your abdomen should move out. Then purse your lips and exhale slowly. You should be able to feel the hand on your abdomen moving in as you exhale.  Controlled coughing Learn and use controlled coughing to clear mucus from your lungs. Controlled coughing is a series of short, progressive coughs. The steps of controlled coughing are: Lean your head slightly forward. Breathe in deeply using diaphragmatic breathing. Try to hold your breath for 3 seconds. Keep your mouth slightly open while coughing twice. Spit any mucus out into a tissue. Rest and repeat the steps once or twice as needed. General instructions Make sure you receive all the vaccines that your health care provider recommends, especially the pneumococcal and influenza vaccines. Preventing infection and hospitalization is very important when you have COPD. Drink enough fluid to keep your urine pale yellow, unless you have a medical condition that requires fluid restriction. Use oxygen therapy and pulmonary rehabilitation if told by your health care provider. If you  require home oxygen therapy, ask your health care provider whether you should purchase a pulse oximeter to measure your oxygen level at home. Work with your health care provider to develop a COPD action plan. This will help you know what steps to take if your condition gets worse. Keep other chronic health conditions under control as told by your health care provider. Avoid extreme temperature and humidity changes. Avoid contact with people who have an illness that spreads from person to person (is contagious), such as viral infections or pneumonia. Keep all follow-up visits. This is important. Contact a health care provider if: You are coughing up more mucus than usual. There is a change in the color or thickness of your mucus. Your breathing is more labored than usual. Your breathing is faster than usual. You have difficulty sleeping. You need to use your rescue medicines or inhalers more often than expected. You have trouble doing routine activities such as getting dressed or walking around the house. Get help right away if: You have shortness of breath while you are resting. You have shortness of breath that prevents you from: Being able to talk. Performing your usual physical activities. You have chest pain lasting longer than 5 minutes. Your skin color is more blue (cyanotic) than usual. You measure low oxygen saturations for longer than 5 minutes with a pulse oximeter. You have a fever. You feel too tired to breathe normally. These symptoms may represent a serious problem that is an emergency. Do not wait to see if the symptoms will go away. Get medical help right away. Call your local emergency services (911 in the [place] Do not drive yourself to the hospital. Summary Chronic obstructive   pulmonary disease (COPD) is a long-term (chronic) condition that affects the lungs. Your lung function will probably never return to normal. In most cases, it gets worse over time. However, there  are steps you can take to slow the progression of the disease and improve your quality of life. Treatment for COPD may include taking medicines, quitting smoking, pulmonary rehabilitation, and changes to diet and exercise. As the disease progresses, you may need oxygen therapy, a lung transplant, or palliative care. To help manage your condition, do not smoke, avoid exposure to things that irritate your lungs, stay up to date on all vaccines, and follow your health care provider's instructions for taking medicines. This information is not intended to replace advice given to you by your health care provider. Make sure you discuss any questions you have with your health care provider. Document Revised: 07/24/2020 Document Reviewed: 07/24/2020 Elsevier Patient Education  2023 [employer]  

## 2022-10-04 IMAGING — US US ABDOMEN COMPLETE
1 series · 13 of 25 positions shown · non-contrast
Comparison: Previous studies including the examination of
05/14/2021

CLINICAL DATA: Cirrhosis

EXAM:
ABDOMEN ULTRASOUND COMPLETE

[Series 1: us abdomen complete · 0.14mm/px · 13 of 96 slices shown]
[im 1/96]
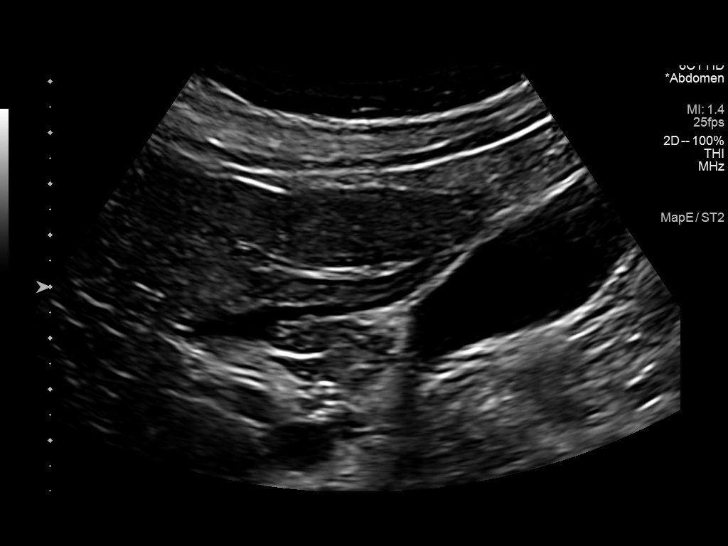
[im 8/96]
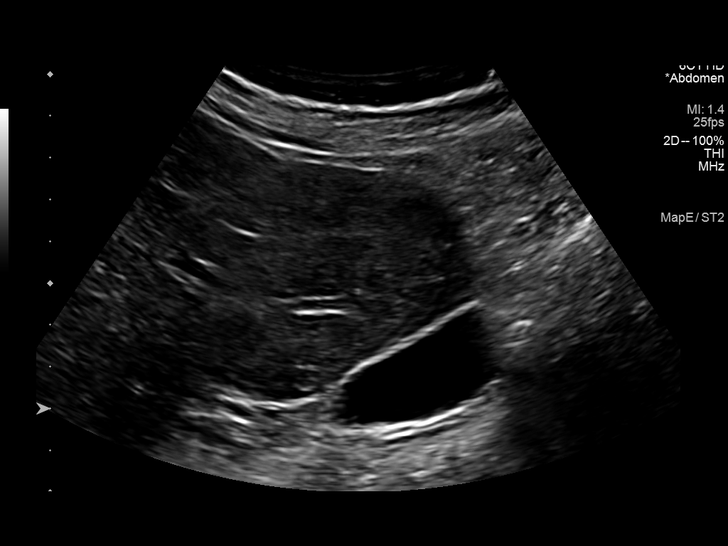
[im 16/96]
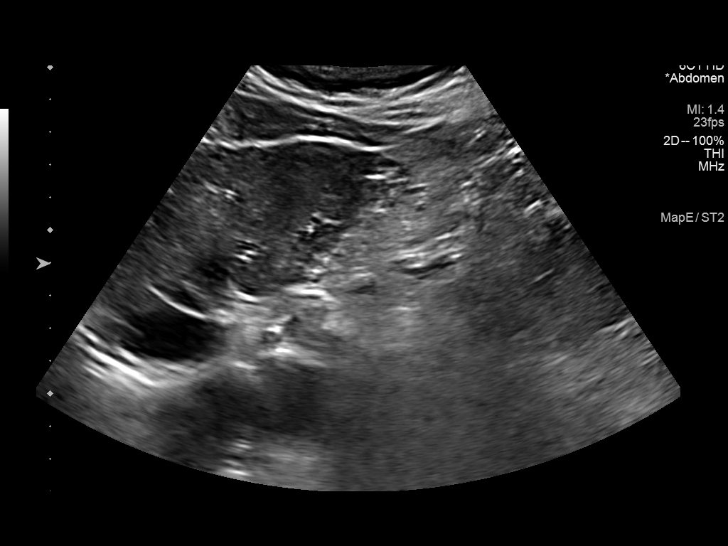
[im 24/96]
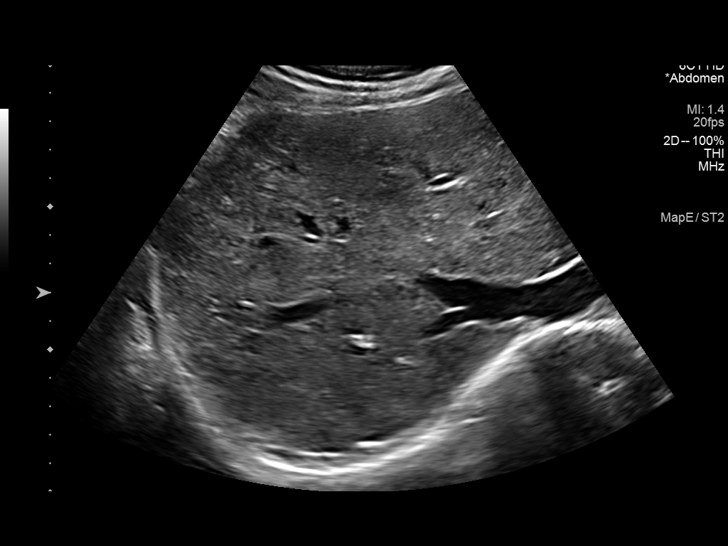
[im 32/96]
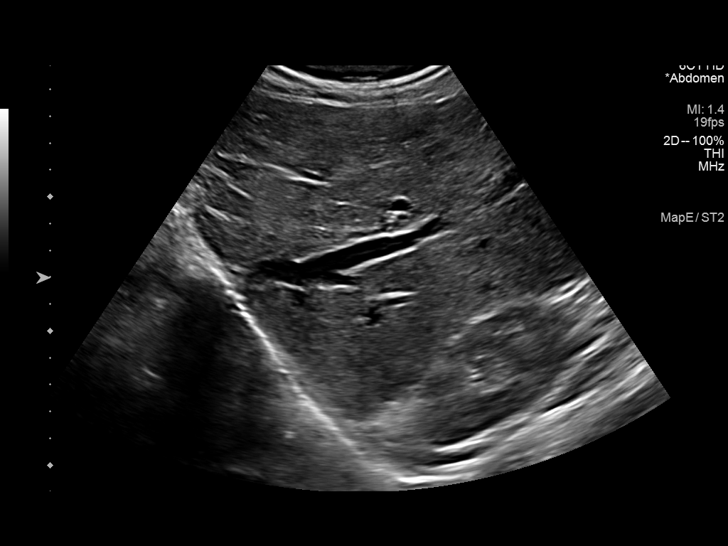
[im 40/96]
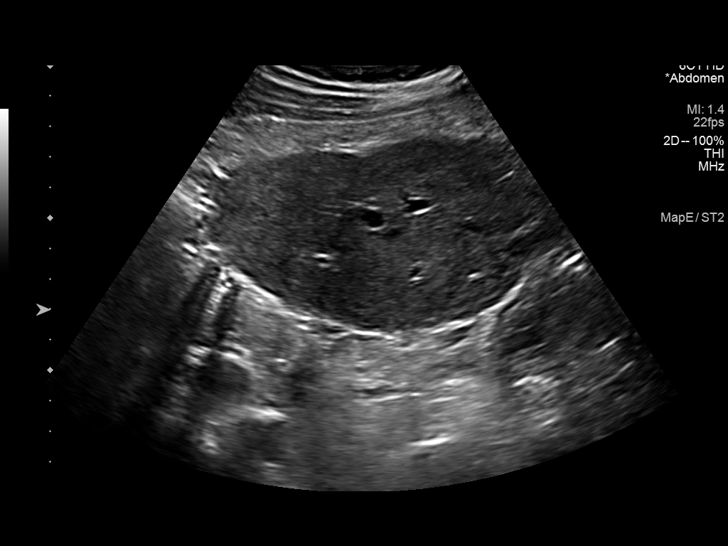
[im 48/96]
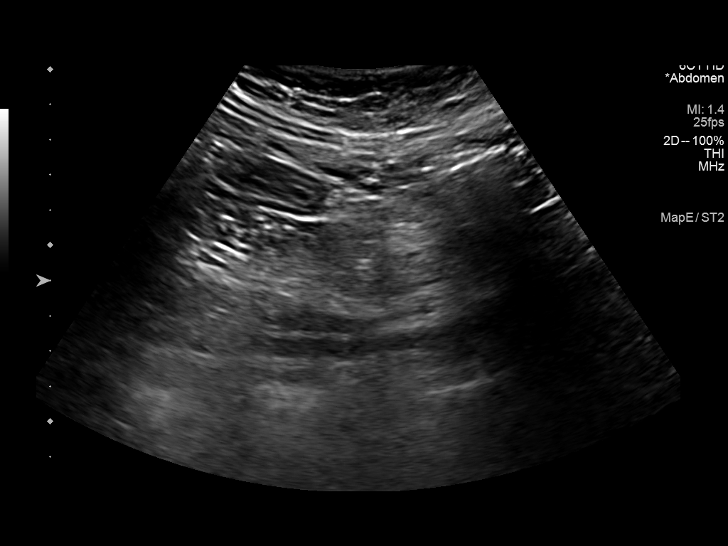
[im 56/96]
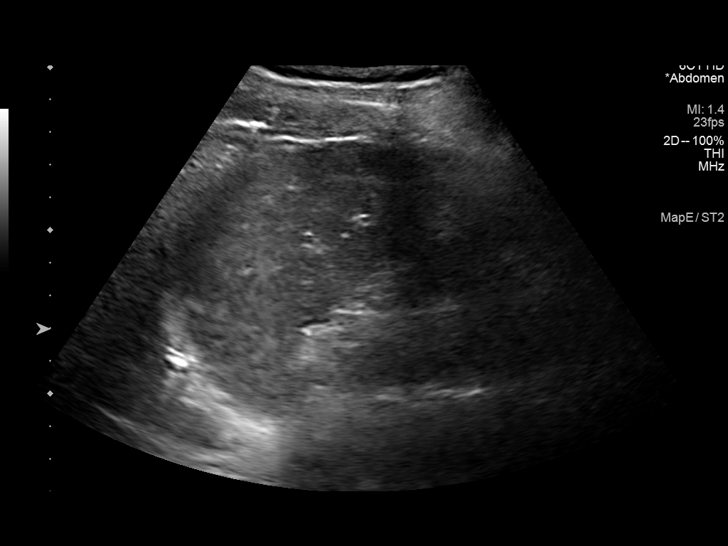
[im 64/96]
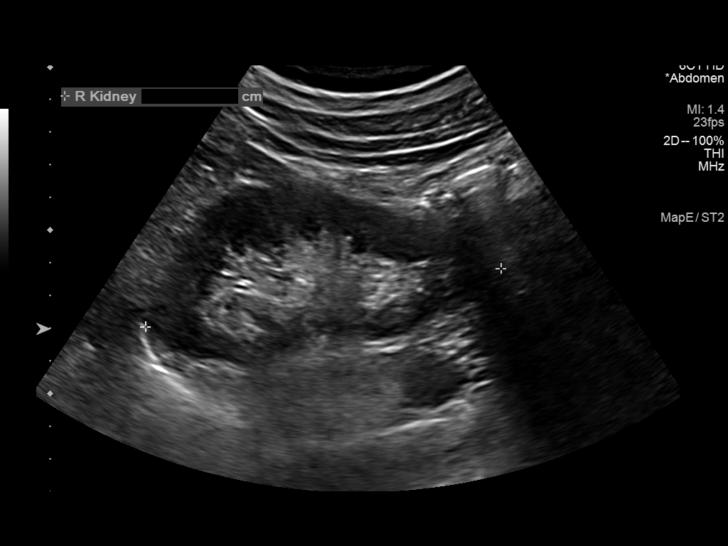
[im 72/96]
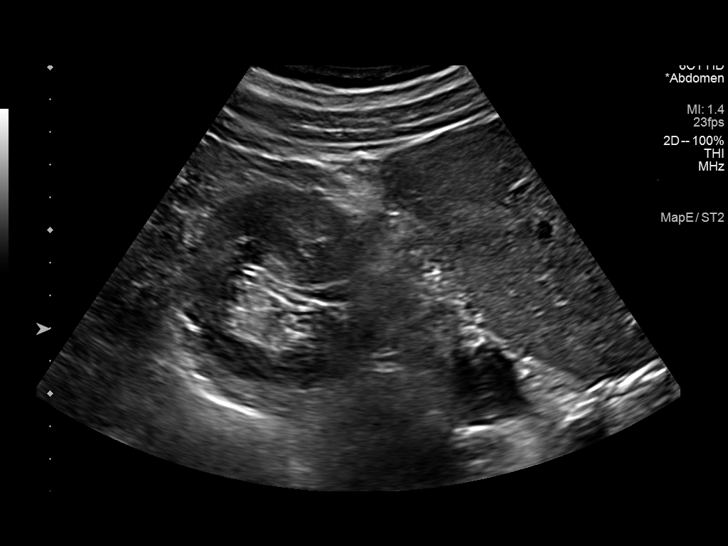
[im 80/96]
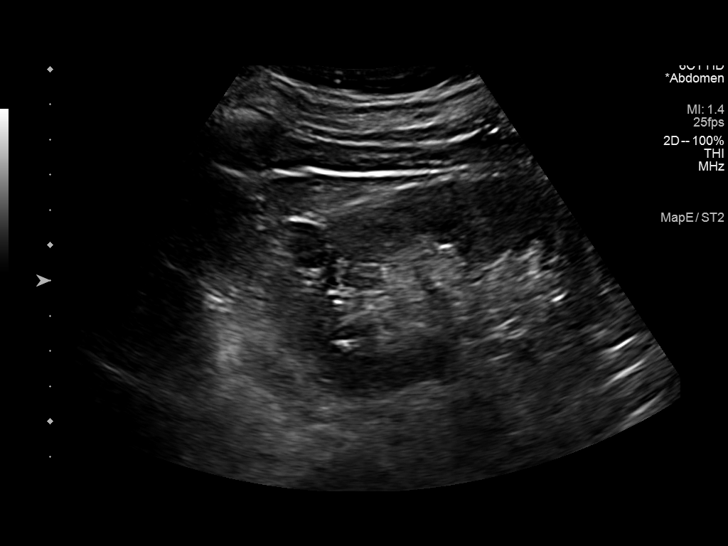
[im 88/96]
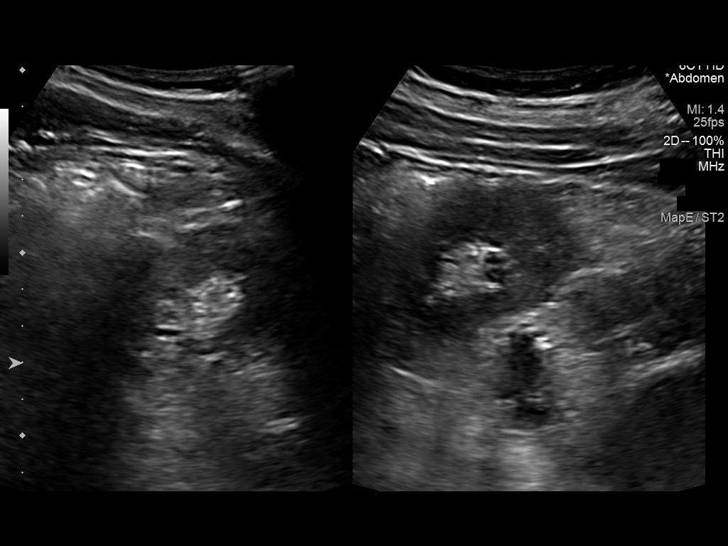
[im 96/96]
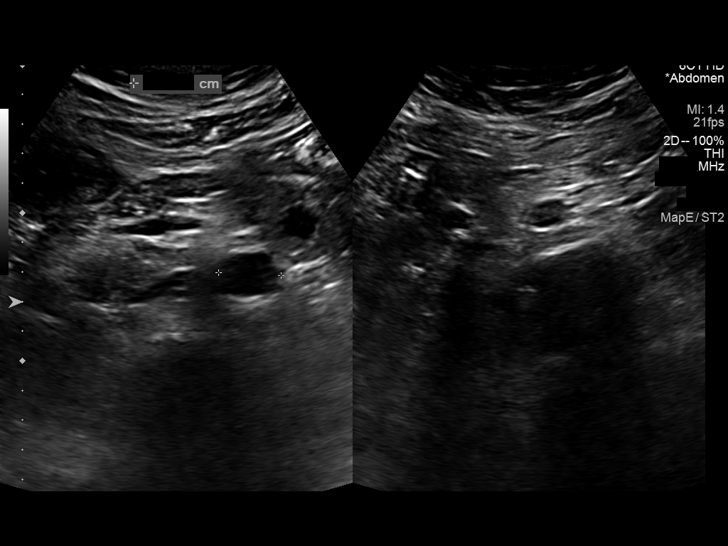

[13 of 25 positions shown; findings below may reference images not displayed]

FINDINGS: Gallbladder: No gallstones or wall thickening visualized. No
sonographic Murphy sign noted by sonographer.

Common bile duct: Diameter: 3 mm

Liver: There is inhomogeneous coarse echogenicity in the liver.
There is mild nodularity in the liver surface. No focal abnormality
is seen in the visualized portions of liver. Portal vein is patent
on color Doppler imaging with normal direction of blood flow towards
the liver.

IVC: No abnormality visualized.

Pancreas: Visualized portion unremarkable.

Spleen: Size and appearance within normal limits.

Right Kidney: Length: 11 cm. Echogenicity within normal limits. No
mass or hydronephrosis visualized.

Left Kidney: Length: 11.3 cm. There is no hydronephrosis. Cortical
echogenicity is unremarkable. There is 1.4 cm cyst in the upper
pole.

Abdominal aorta: Atherosclerotic plaques are seen.

Other findings: None.
IMPRESSION: Coarsening of echoes and nodularity in the liver surface suggests
cirrhosis. No focal abnormality is seen in the visualized portions
of liver.

Left renal cyst.  Abdominal sonogram is otherwise unremarkable.

## 2022-10-13 ENCOUNTER — Ambulatory Visit (INDEPENDENT_AMBULATORY_CARE_PROVIDER_SITE_OTHER): Payer: 59 | Admitting: *Deleted

## 2022-10-13 DIAGNOSIS — E538 Deficiency of other specified B group vitamins: Secondary | ICD-10-CM

## 2022-10-13 MED ORDER — CYANOCOBALAMIN 1000 MCG/ML IJ SOLN
1000.0000 ug | Freq: Once | INTRAMUSCULAR | Status: AC
  Administered 2022-10-13: 1000 ug via INTRAMUSCULAR

## 2022-10-13 NOTE — Progress Notes (Signed)
Patient here for his monthly b12 injection. Given in right deltoid. Patient tolerated well

## 2022-11-11 ENCOUNTER — Other Ambulatory Visit: Payer: Self-pay | Admitting: Gastroenterology

## 2022-11-11 DIAGNOSIS — K7460 Unspecified cirrhosis of liver: Secondary | ICD-10-CM

## 2022-11-12 ENCOUNTER — Encounter: Payer: Self-pay | Admitting: Podiatry

## 2022-11-12 ENCOUNTER — Ambulatory Visit (INDEPENDENT_AMBULATORY_CARE_PROVIDER_SITE_OTHER): Payer: 59 | Admitting: Podiatry

## 2022-11-12 VITALS — BP 139/81 | HR 70

## 2022-11-12 DIAGNOSIS — B351 Tinea unguium: Secondary | ICD-10-CM | POA: Diagnosis not present

## 2022-11-12 DIAGNOSIS — N1832 Chronic kidney disease, stage 3b: Secondary | ICD-10-CM | POA: Diagnosis not present

## 2022-11-12 DIAGNOSIS — M79674 Pain in right toe(s): Secondary | ICD-10-CM | POA: Diagnosis not present

## 2022-11-12 DIAGNOSIS — M79675 Pain in left toe(s): Secondary | ICD-10-CM

## 2022-11-12 NOTE — Progress Notes (Signed)
This patient returns to my office for at risk foot care.  This patient requires this care by a professional since this patient will be at risk due to having chronic kidney disease  This patient is unable to cut nails himself since the patient cannot reach his nails.These nails are painful walking and wearing shoes.  This patient presents for at risk foot care today.  General Appearance  Alert, conversant and in no acute stress.  Vascular  Dorsalis pedis and posterior tibial  pulses are weakly  palpable  bilaterally.  Capillary return is within normal limits  bilaterally. Temperature is within normal limits  bilaterally.  Neurologic  Senn-Weinstein monofilament wire test within normal limits  bilaterally. Muscle power within normal limits bilaterally.  Nails Thick disfigured discolored nails with subungual debris  from hallux to fifth toes bilaterally. No evidence of bacterial infection or drainage bilaterally.  Orthopedic  No limitations of motion  feet .  No crepitus or effusions noted.  No bony pathology or digital deformities noted.  Skin  normotropic skin with no porokeratosis noted bilaterally.  No signs of infections or ulcers noted.     Onychomycosis  Pain in right toes  Pain in left toes  Consent was obtained for treatment procedures.   Mechanical debridement of nails 1-5  bilaterally performed with a nail nipper.  Filed with dremel without incident.    Return office visit   10 weeks                  Told patient to return for periodic foot care and evaluation due to potential at risk complications.   Gardiner Barefoot DPM

## 2022-11-13 ENCOUNTER — Ambulatory Visit (INDEPENDENT_AMBULATORY_CARE_PROVIDER_SITE_OTHER): Payer: 59

## 2022-11-13 DIAGNOSIS — E538 Deficiency of other specified B group vitamins: Secondary | ICD-10-CM

## 2022-11-13 MED ORDER — CYANOCOBALAMIN 1000 MCG/ML IJ SOLN
1000.0000 ug | Freq: Once | INTRAMUSCULAR | Status: AC
  Administered 2022-11-13: 1000 ug via INTRAMUSCULAR

## 2022-11-13 NOTE — Progress Notes (Signed)
After obtaining consent, and per orders of Dr. Ronnald Ramp, injection of B12 given by Marrian Salvage. Patient instructed to report any adverse reaction to me immediately.

## 2022-11-18 ENCOUNTER — Other Ambulatory Visit: Payer: Self-pay | Admitting: Internal Medicine

## 2022-11-18 DIAGNOSIS — M792 Neuralgia and neuritis, unspecified: Secondary | ICD-10-CM

## 2022-11-18 NOTE — Progress Notes (Unsigned)
NEUROLOGY FOLLOW UP OFFICE NOTE  Jaime Barnes [id number]  Assessment/Plan:   1  Migraine without aura, without status migrainosus, not intractable 2  New onset headache - circumscribed left parietal headache unclear etiology.  It is paroxysmal, making nummular headache less likely.     Migraine prevention:  topiramate 16m twice daily.   2.  Migraine rescue:  Ubrelvy 1078m3.  Limit use of pain relievers to no more than 2 days out of week to prevent risk of rebound or medication-overuse headache. 4.  Keep headache diary 5.  Follow up 1 year  Subjective:  DwNavarro Barnes a [age]ear old right-handed man with hypertension, bipolar disorder, CKD, tobacco use, Barrett's esophagus and cirrhosis who follows up for migraine.   UPDATE: Intensity:  10/10 Duration:  Within 30 minutes with UbRoselyn Meierrequency:  2-3 a month   Last year, he started getting a squeezing/throbbing pain in his left parietal region last 3-4 minutes.  Squeezing and rubbing head over it and takes a tizanidine helps ease it. They occur once a week.  CTA head on 01/07/2022 personally reviewed revealed mild atherosclerotic plaque in the intercranial ICAs but no significant stenosis or aneurysms.  Those headaches resolved.  Current NSAIDS:  none Current analgesics:  none Current triptans: none Current anti-emetic:  none Current muscle relaxants:  none Current Antihypertensive medications:  Lisinopril 1040mverapamil 120m13mree times daily, metoprolol Current Antidepressant medications:  none Current Anticonvulsant medications: topiramate 100mg61mce daily, gabapentin 300mg 30me daily Current anti-CGRP:  Ubrelvy 100mg  [facility] with reducing stress and feeling better.   HISTORY: Onset:  Has remote history of migraines but they returned in 2016 after experiencing increased stress related to his ex-girlfriend. Location:  Right sided Quality:  pounding Initial Intensity:   "50"/10 Aura:  no Prodrome:  no Associated symptoms: Photophobia, phonophobia.  No nausea or visual disturbance Initial Duration:  Until takes medication Initial Frequency:  every other day Triggers:  Emotional stress Relieving factors: None Activity:  Cannot function   Past abortive medication:  ibuprofen, naproxen, Tylenol, Excedrin, sumatriptan (effective but contraindicated due to cardiac history) Past preventative medication:  none Other past therapy:  none   CT of head from 07/19/15 was unremarkable.    MRI of brain from 12/04/14 showed global atrophy and mild small vessel ischemic changes. 06/23/16:  Sed rate 25  PAST MEDICAL HISTORY: Past Medical History:  Diagnosis Date   Barrett's esophagus 2011   Recommend repeat EGD for surveillance in 2 yrs-[facility]   Bipolar disorder (HCC)   H[place]ronic kidney disease    Cirrhosis (HCC)   [place]    GANGLION CYST, TENDON SHEATH 06/26/2010   Hyperlipidemia    Hypertension    Hypertriglyceridemia 01/21/2019   Impotence    Migraine    Pancreatitis    Pulmonary nodule    Pulmonary nodule seen on imaging study 02/06/2011   Followup CT in December 2014 with 20 months of stability at 4 mm.  No further imaging recommended     MEDICATIONS: Current Outpatient Medications on File Prior to Visit  Medication Sig Dispense Refill   albuterol (VENTOLIN HFA) 108 (90 Base) MCG/ACT inhaler INHALE 1 TO 2 PUFFS INTO THE LUNGS EVERY 6 HOURS AS NEEDED FOR WHEEZING OR SHORTNESS OF BREATH 18 g 5   amLODipine (NORVASC) 10 MG tablet TAKE 1 TABLET(10 MG) BY MOUTH DAILY 90 tablet 3   aspirin 81 MG chewable tablet Chew  81 mg by mouth daily.     Budeson-Glycopyrrol-Formoterol (BREZTRI AEROSPHERE) 160-9-4.8 MCG/ACT AERO Inhale 2 puffs into the lungs 2 (two) times daily. 10.7 g 5   Ferric Maltol (ACCRUFER) 30 MG CAPS Take 1 capsule by mouth in the morning and at bedtime. 180 capsule 1   gabapentin (NEURONTIN) 300 MG capsule TAKE 1 CAPSULE(300 MG)  BY MOUTH TWICE DAILY 180 capsule 1   hydrochlorothiazide (MICROZIDE) 12.5 MG capsule Take 1 capsule (12.5 mg total) by mouth daily. 90 capsule 3   icosapent Ethyl (VASCEPA) 1 g capsule Take 2 capsules (2 g total) by mouth 2 (two) times daily. 360 capsule 3   ketorolac (ACULAR) 0.4 % SOLN Place 1 drop into the left eye 4 (four) times daily.     LORazepam (ATIVAN) 0.5 MG tablet 1-2 tabs 30 - 60 min prior to injection. Do not drive with this medicine. 4 tablet 0   magnesium oxide (MAG-OX) 400 (240 Mg) MG tablet TAKE 1 TABLET(400 MG) BY MOUTH DAILY 90 tablet 1   ofloxacin (OCUFLOX) 0.3 % ophthalmic solution Place 1 drop into the left eye 4 (four) times daily.     omeprazole (PRILOSEC) 20 MG capsule Take 1 capsule (20 mg total) by mouth 2 (two) times daily before a meal. 180 capsule 1   polyethylene glycol (MIRALAX / GLYCOLAX) 17 g packet Take 17 g by mouth daily. 14 each 0   prednisoLONE acetate (PRED FORTE) 1 % ophthalmic suspension Place 1 drop into the left eye 4 (four) times daily.     risperiDONE (RISPERDAL) 1 MG tablet TAKE 1 TABLET(1 MG) BY MOUTH AT BEDTIME 90 tablet 0   rosuvastatin (CRESTOR) 20 MG tablet Take 1 tablet (20 mg total) by mouth daily. 90 tablet 3   tadalafil (CIALIS) 5 MG tablet SMARTSIG:1-4 Tablet(s) By Mouth Daily PRN     thiamine (VITAMIN B1) 100 MG tablet Take 1 tablet (100 mg total) by mouth daily. 90 tablet 1   topiramate (TOPAMAX) 100 MG tablet TAKE 1 TABLET(100 MG) BY MOUTH TWICE DAILY 60 tablet 5   traMADol (ULTRAM) 50 MG tablet Take 1 tablet (50 mg total) by mouth every 12 (twelve) hours as needed. 60 tablet 2   traZODone (DESYREL) 100 MG tablet TAKE 1/2 TO 1 TABLET(50 TO 100 MG) BY MOUTH AT BEDTIME 60 tablet 1   Ubrogepant (UBRELVY) 100 MG TABS TAKE 1 TABLET(100 MG) BY MOUTH 1 TIME EVERY 2 HOURS AS DIRECTED AS NEEDED. MAXIMUM 2 TABLETS IN 24 HOURS 16 tablet 5   valACYclovir (VALTREX) 500 MG tablet TAKE 1 TABLET(500 MG) BY MOUTH DAILY 90 tablet 1   No current  facility-administered medications on file prior to visit.    ALLERGIES: Allergies  Allergen Reactions   Other     Other reaction(s): rash, sweat, nausea Other reaction(s): rash, sweat, nausea   Codeine Phosphate Other (See Comments)    REACTION: breathing difficulty   Penicillins Rash and Other (See Comments)    Break out in sweats/ Rash amoxicillin. Denies airway involvement Has patient had a PCN reaction causing immediate rash, facial/tongue/throat swelling, SOB or lightheadedness with hypotension: Yes Has patient had a PCN reaction causing severe rash involving mucus membranes or skin necrosis: Yes Has patient had a PCN reaction that required hospitalization No Has patient had a PCN reaction occurring within the last 10 years: No If all of the above answers are "NO", then may proceed with Cephalosporin u    FAMILY HISTORY: Family History  Problem Relation Age of  Onset   Anemia Mother    Hypertension Daughter    Diabetes Daughter    Migraines Daughter       Objective:  Blood pressure 137/81, pulse 76, height 5' 6"$  (1.676 m), weight 173 lb 11.2 oz (78.8 kg), SpO2 97 %. General: No acute distress.  Patient appears well-groomed.   Head:  Normocephalic/atraumatic Eyes:  Fundi examined but not visualized Neurological Exam: alert and oriented to person, place, and time.  Speech fluent and not dysarthric, language intact.  CN II-XII intact. Bulk and tone normal, muscle strength 5/5 throughout.  Sensation to light touch intact.  Deep tendon reflexes 2+ throughout.  Finger to nose testing intact.  Gait normal, Romberg negative.   Jaime Clines, DO  CC: Scarlette Calico, MD

## 2022-11-19 ENCOUNTER — Encounter: Payer: Self-pay | Admitting: Neurology

## 2022-11-19 ENCOUNTER — Ambulatory Visit (INDEPENDENT_AMBULATORY_CARE_PROVIDER_SITE_OTHER): Payer: 59 | Admitting: Neurology

## 2022-11-19 VITALS — BP 137/81 | HR 76 | Ht 66.0 in | Wt 173.7 lb

## 2022-11-19 DIAGNOSIS — G43009 Migraine without aura, not intractable, without status migrainosus: Secondary | ICD-10-CM | POA: Diagnosis not present

## 2022-11-19 MED ORDER — TOPIRAMATE 100 MG PO TABS: ORAL_TABLET | ORAL | 11 refills | Status: DC

## 2022-11-19 MED ORDER — UBRELVY 100 MG PO TABS: ORAL_TABLET | ORAL | 11 refills | Status: DC

## 2022-11-19 NOTE — Patient Instructions (Signed)
Topiramate  Jaime Barnes

## 2022-12-01 ENCOUNTER — Ambulatory Visit
Admission: RE | Admit: 2022-12-01 | Discharge: 2022-12-01 | Disposition: A | Discharge status: Home / Self Care | Payer: 59 | Source: Ambulatory Visit | Attending: Gastroenterology | Admitting: Gastroenterology

## 2022-12-01 DIAGNOSIS — K7460 Unspecified cirrhosis of liver: Secondary | ICD-10-CM | POA: Diagnosis not present

## 2022-12-01 DIAGNOSIS — I700 Atherosclerosis of aorta: Secondary | ICD-10-CM | POA: Diagnosis not present

## 2022-12-11 ENCOUNTER — Telehealth: Payer: Self-pay

## 2022-12-11 MED ORDER — NURTEC 75 MG PO TBDP: 75.0000 mg | ORAL_TABLET | ORAL | 5 refills | Status: DC | PRN

## 2022-12-11 NOTE — Telephone Encounter (Signed)
PA team please start a PA for Nurtec 75 mg.

## 2022-12-11 NOTE — Telephone Encounter (Signed)
Letter received from [facility] insurance, Temporary supply of Urbelvy 100 mg. The drug is not on the formulary.  No alternatives given.

## 2022-12-11 NOTE — Telephone Encounter (Signed)
  Try Nurtec 75mg  daily as needed.  Quantity 8.  Refills 11.  Triptans are contraindicated due to heart disease.  If he is agreeable, please send in prescription.

## 2022-12-12 ENCOUNTER — Other Ambulatory Visit (HOSPITAL_BASED_OUTPATIENT_CLINIC_OR_DEPARTMENT_OTHER): Payer: Self-pay | Admitting: Cardiovascular Disease

## 2022-12-12 ENCOUNTER — Ambulatory Visit (INDEPENDENT_AMBULATORY_CARE_PROVIDER_SITE_OTHER): Payer: 59 | Admitting: *Deleted

## 2022-12-12 DIAGNOSIS — E538 Deficiency of other specified B group vitamins: Secondary | ICD-10-CM | POA: Diagnosis not present

## 2022-12-12 MED ORDER — CYANOCOBALAMIN 1000 MCG/ML IJ SOLN
1000.0000 ug | Freq: Once | INTRAMUSCULAR | Status: AC
  Administered 2022-12-12: 1000 ug via INTRAMUSCULAR

## 2022-12-12 NOTE — Telephone Encounter (Signed)
Rx(s) sent to pharmacy electronically.  

## 2022-12-12 NOTE — Progress Notes (Signed)
Pls cosign for B12 inj../lmb  

## 2022-12-15 ENCOUNTER — Telehealth: Payer: Self-pay | Admitting: Internal Medicine

## 2022-12-15 ENCOUNTER — Other Ambulatory Visit: Payer: Self-pay | Admitting: Internal Medicine

## 2022-12-15 DIAGNOSIS — F5104 Psychophysiologic insomnia: Secondary | ICD-10-CM

## 2022-12-15 MED ORDER — TRAZODONE HCL 100 MG PO TABS: 200.0000 mg | ORAL_TABLET | Freq: Every day | ORAL | 0 refills | Status: DC

## 2022-12-15 NOTE — Telephone Encounter (Signed)
Patient needs his trazadone refilled - he takes 2 tablets instead of 1.  [facility] on [address] told him that Dr. Ronnald Ramp would have to call in a prescription for him to get it today.  Please send to [facility].  Patient's number:  [phone]

## 2022-12-23 ENCOUNTER — Telehealth: Payer: Self-pay

## 2022-12-23 ENCOUNTER — Other Ambulatory Visit (HOSPITAL_COMMUNITY): Payer: Self-pay

## 2022-12-23 NOTE — Telephone Encounter (Signed)
New encounter created for authorization Updates will be located there. Thank you

## 2022-12-23 NOTE — Telephone Encounter (Signed)
Patient Advocate Encounter   Received notification from [employer] Part D that prior authorization is required for Nurtec 75MG  dispersible tablets  Submitted: 12-23-2022 [facility]  Status is pending

## 2022-12-23 NOTE — Telephone Encounter (Signed)
Patient Advocate Encounter  Prior Authorization for Nurtec 75MG  dispersible tablets has been approved through [employer] Part D.    KeySteve Barnes  Effective: 12-23-2022 to 09-29-2023   Test billing returns $0.00 copay for 30 day supply.

## 2022-12-25 NOTE — Telephone Encounter (Signed)
LMOVM approval.  

## 2023-01-01 ENCOUNTER — Ambulatory Visit (INDEPENDENT_AMBULATORY_CARE_PROVIDER_SITE_OTHER): Payer: 59 | Admitting: Internal Medicine

## 2023-01-01 ENCOUNTER — Encounter: Payer: Self-pay | Admitting: Internal Medicine

## 2023-01-01 VITALS — BP 138/78 | HR 65 | Temp 97.8°F | Ht 66.0 in | Wt 172.0 lb

## 2023-01-01 DIAGNOSIS — N1832 Chronic kidney disease, stage 3b: Secondary | ICD-10-CM | POA: Diagnosis not present

## 2023-01-01 DIAGNOSIS — K7030 Alcoholic cirrhosis of liver without ascites: Secondary | ICD-10-CM

## 2023-01-01 DIAGNOSIS — Z0001 Encounter for general adult medical examination with abnormal findings: Secondary | ICD-10-CM | POA: Diagnosis not present

## 2023-01-01 DIAGNOSIS — D508 Other iron deficiency anemias: Secondary | ICD-10-CM

## 2023-01-01 DIAGNOSIS — F5104 Psychophysiologic insomnia: Secondary | ICD-10-CM

## 2023-01-01 DIAGNOSIS — E519 Thiamine deficiency, unspecified: Secondary | ICD-10-CM | POA: Diagnosis not present

## 2023-01-01 DIAGNOSIS — I4729 Other ventricular tachycardia: Secondary | ICD-10-CM | POA: Diagnosis not present

## 2023-01-01 DIAGNOSIS — D510 Vitamin B12 deficiency anemia due to intrinsic factor deficiency: Secondary | ICD-10-CM

## 2023-01-01 DIAGNOSIS — E785 Hyperlipidemia, unspecified: Secondary | ICD-10-CM | POA: Diagnosis not present

## 2023-01-01 DIAGNOSIS — D538 Other specified nutritional anemias: Secondary | ICD-10-CM

## 2023-01-01 DIAGNOSIS — I10 Essential (primary) hypertension: Secondary | ICD-10-CM

## 2023-01-01 DIAGNOSIS — Z125 Encounter for screening for malignant neoplasm of prostate: Secondary | ICD-10-CM

## 2023-01-01 DIAGNOSIS — E781 Pure hyperglyceridemia: Secondary | ICD-10-CM | POA: Diagnosis not present

## 2023-01-01 LAB — PROTIME-INR
INR: 1 ratio (ref 0.8–1.0)
Prothrombin Time: 11.1 s (ref 9.6–13.1)

## 2023-01-01 LAB — HEPATIC FUNCTION PANEL
ALT: 13 U/L (ref 0–53)
AST: 13 U/L (ref 0–37)
Albumin: 4.6 g/dL (ref 3.5–5.2)
Alkaline Phosphatase: 88 U/L (ref 39–117)
Bilirubin, Direct: 0.1 mg/dL (ref 0.0–0.3)
Total Bilirubin: 0.3 mg/dL (ref 0.2–1.2)
Total Protein: 7.6 g/dL (ref 6.0–8.3)

## 2023-01-01 LAB — CBC WITH DIFFERENTIAL/PLATELET
Basophils Absolute: 0.1 10*3/uL (ref 0.0–0.1)
Basophils Relative: 0.7 % (ref 0.0–3.0)
Eosinophils Absolute: 0.1 10*3/uL (ref 0.0–0.7)
Eosinophils Relative: 1.3 % (ref 0.0–5.0)
HCT: 41 % (ref 39.0–52.0)
Hemoglobin: 14.2 g/dL (ref 13.0–17.0)
Lymphocytes Relative: 21.1 % (ref 12.0–46.0)
Lymphs Abs: 2.2 10*3/uL (ref 0.7–4.0)
MCHC: 34.5 g/dL (ref 30.0–36.0)
MCV: 94.1 fl (ref 78.0–100.0)
Monocytes Absolute: 1.1 10*3/uL — ABNORMAL HIGH (ref 0.1–1.0)
Monocytes Relative: 10.4 % (ref 3.0–12.0)
Neutro Abs: 7 10*3/uL (ref 1.4–7.7)
Neutrophils Relative %: 66.5 % (ref 43.0–77.0)
Platelets: 227 10*3/uL (ref 150.0–400.0)
RBC: 4.35 Mil/uL (ref 4.22–5.81)
RDW: 15 % (ref 11.5–15.5)
WBC: 10.6 10*3/uL — ABNORMAL HIGH (ref 4.0–10.5)

## 2023-01-01 LAB — TSH: TSH: 3.8 u[IU]/mL (ref 0.35–5.50)

## 2023-01-01 LAB — URINALYSIS, ROUTINE W REFLEX MICROSCOPIC
Bilirubin Urine: NEGATIVE
Hgb urine dipstick: NEGATIVE
Ketones, ur: NEGATIVE
Leukocytes,Ua: NEGATIVE
Nitrite: NEGATIVE
RBC / HPF: NONE SEEN (ref 0–?)
Specific Gravity, Urine: 1.02 (ref 1.000–1.030)
Total Protein, Urine: NEGATIVE
Urine Glucose: NEGATIVE
Urobilinogen, UA: 0.2 (ref 0.0–1.0)
WBC, UA: NONE SEEN (ref 0–?)
pH: 6.5 (ref 5.0–8.0)

## 2023-01-01 LAB — BASIC METABOLIC PANEL
BUN: 13 mg/dL (ref 6–23)
CO2: 28 mEq/L (ref 19–32)
Calcium: 9.8 mg/dL (ref 8.4–10.5)
Chloride: 107 mEq/L (ref 96–112)
Creatinine, Ser: 1.24 mg/dL (ref 0.40–1.50)
GFR: 58.11 mL/min — ABNORMAL LOW (ref >60.00)
Glucose, Bld: 75 mg/dL (ref 70–99)
Potassium: 4.1 mEq/L (ref 3.5–5.1)
Sodium: 142 mEq/L (ref 135–145)

## 2023-01-01 LAB — LIPID PANEL
Cholesterol: 112 mg/dL (ref 0–200)
HDL: 36.1 mg/dL — ABNORMAL LOW (ref >39.00)
LDL Cholesterol: 44 mg/dL (ref 0–99)
NonHDL: 76.29
Total CHOL/HDL Ratio: 3
Triglycerides: 159 mg/dL — ABNORMAL HIGH (ref 0.0–149.0)
VLDL: 31.8 mg/dL (ref 0.0–40.0)

## 2023-01-01 LAB — IBC + FERRITIN
Ferritin: 47.4 ng/mL (ref 22.0–322.0)
Iron: 60 ug/dL (ref 42–165)
Saturation Ratios: 17.2 % — ABNORMAL LOW (ref 20.0–50.0)
TIBC: 348.6 ug/dL (ref 250.0–450.0)
Transferrin: 249 mg/dL (ref 212.0–360.0)

## 2023-01-01 LAB — PSA: PSA: 1.08 ng/mL (ref 0.10–4.00)

## 2023-01-01 MED ORDER — CYANOCOBALAMIN 1000 MCG/ML IJ SOLN
1000.0000 ug | Freq: Once | INTRAMUSCULAR | Status: AC
Start: 2023-01-01 — End: 2023-01-01
  Administered 2023-01-01: 1000 ug via INTRAMUSCULAR

## 2023-01-01 MED ORDER — TRAZODONE HCL 100 MG PO TABS
200.0000 mg | ORAL_TABLET | Freq: Every day | ORAL | 1 refills | Status: DC
Start: 2023-01-01 — End: 2023-11-19

## 2023-01-01 NOTE — Patient Instructions (Signed)
Health Maintenance, Male Adopting a healthy lifestyle and getting preventive care are important in promoting health and wellness. Ask your health care provider about: The right schedule for you to have regular tests and exams. Things you can do on your own to prevent diseases and keep yourself healthy. What should I know about diet, weight, and exercise? Eat a healthy diet  Eat a diet that includes plenty of vegetables, fruits, low-fat dairy products, and lean protein. Do not eat a lot of foods that are high in solid fats, added sugars, or sodium. Maintain a healthy weight Body mass index (BMI) is a measurement that can be used to identify possible weight problems. It estimates body fat based on height and weight. Your health care provider can help determine your BMI and help you achieve or maintain a healthy weight. Get regular exercise Get regular exercise. This is one of the most important things you can do for your health. Most adults should: Exercise for at least 150 minutes each week. The exercise should increase your heart rate and make you sweat (moderate-intensity exercise). Do strengthening exercises at least twice a week. This is in addition to the moderate-intensity exercise. Spend less time sitting. Even light physical activity can be beneficial. Watch cholesterol and blood lipids Have your blood tested for lipids and cholesterol at 73 years of age, then have this test every 5 years. You may need to have your cholesterol levels checked more often if: Your lipid or cholesterol levels are high. You are older than 73 years of age. You are at high risk for heart disease. What should I know about cancer screening? Many types of cancers can be detected early and may often be prevented. Depending on your health history and family history, you may need to have cancer screening at various ages. This may include screening for: Colorectal cancer. Prostate cancer. Skin cancer. Lung  cancer. What should I know about heart disease, diabetes, and high blood pressure? Blood pressure and heart disease High blood pressure causes heart disease and increases the risk of stroke. This is more likely to develop in people who have high blood pressure readings or are overweight. Talk with your health care provider about your target blood pressure readings. Have your blood pressure checked: Every 3-5 years if you are 18-39 years of age. Every year if you are 40 years old or older. If you are between the ages of 65 and 75 and are a current or former smoker, ask your health care provider if you should have a one-time screening for abdominal aortic aneurysm (AAA). Diabetes Have regular diabetes screenings. This checks your fasting blood sugar level. Have the screening done: Once every three years after age 45 if you are at a normal weight and have a low risk for diabetes. More often and at a younger age if you are overweight or have a high risk for diabetes. What should I know about preventing infection? Hepatitis B If you have a higher risk for hepatitis B, you should be screened for this virus. Talk with your health care provider to find out if you are at risk for hepatitis B infection. Hepatitis C Blood testing is recommended for: Everyone born from 1945 through 1965. Anyone with known risk factors for hepatitis C. Sexually transmitted infections (STIs) You should be screened each year for STIs, including gonorrhea and chlamydia, if: You are sexually active and are younger than 73 years of age. You are older than 73 years of age and your   health care provider tells you that you are at risk for this type of infection. Your sexual activity has changed since you were last screened, and you are at increased risk for chlamydia or gonorrhea. Ask your health care provider if you are at risk. Ask your health care provider about whether you are at high risk for HIV. Your health care provider  may recommend a prescription medicine to help prevent HIV infection. If you choose to take medicine to prevent HIV, you should first get tested for HIV. You should then be tested every 3 months for as long as you are taking the medicine. Follow these instructions at home: Alcohol use Do not drink alcohol if your health care provider tells you not to drink. If you drink alcohol: Limit how much you have to 0-2 drinks a day. Know how much alcohol is in your drink. In the [place] one drink equals one 12 oz bottle of beer (355 mL), one 5 oz glass of wine (148 mL), or one 1 oz glass of hard liquor (44 mL). Lifestyle Do not use any products that contain nicotine or tobacco. These products include cigarettes, chewing tobacco, and vaping devices, such as e-cigarettes. If you need help quitting, ask your health care provider. Do not use street drugs. Do not share needles. Ask your health care provider for help if you need support or information about quitting drugs. General instructions Schedule regular health, dental, and eye exams. Stay current with your vaccines. Tell your health care provider if: You often feel depressed. You have ever been abused or do not feel safe at home. Summary Adopting a healthy lifestyle and getting preventive care are important in promoting health and wellness. Follow your health care provider's instructions about healthy diet, exercising, and getting tested or screened for diseases. Follow your health care provider's instructions on monitoring your cholesterol and blood pressure. This information is not intended to replace advice given to you by your health care provider. Make sure you discuss any questions you have with your health care provider. Document Revised: 02/04/2021 Document Reviewed: 02/04/2021 Elsevier Patient Education  2023 [employer]  

## 2023-01-01 NOTE — Progress Notes (Signed)
Subjective:  Patient ID: Jaime Barnes, male    DOB: 1951/04/01  Age: 72 y.o. MRN: [id number]  CC: Annual Exam, Hypertension, and Hyperlipidemia   HPI Jaime Barnes presents for a CPX and f/up ---   He continues to complain of pain in his large joints and insomnia.  He is active and denies chest pain, shortness of breath, palpitations, or edema.  Outpatient Medications Prior to Visit  Medication Sig Dispense Refill   albuterol (VENTOLIN HFA) 108 (90 Base) MCG/ACT inhaler INHALE 1 TO 2 PUFFS INTO THE LUNGS EVERY 6 HOURS AS NEEDED FOR WHEEZING OR SHORTNESS OF BREATH 18 g 5   amLODipine (NORVASC) 10 MG tablet TAKE 1 TABLET(10 MG) BY MOUTH DAILY 90 tablet 3   aspirin 81 MG chewable tablet Chew 81 mg by mouth daily.     Budeson-Glycopyrrol-Formoterol (BREZTRI AEROSPHERE) 160-9-4.8 MCG/ACT AERO Inhale 2 puffs into the lungs 2 (two) times daily. 10.7 g 5   Ferric Maltol (ACCRUFER) 30 MG CAPS Take 1 capsule by mouth in the morning and at bedtime. 180 capsule 1   gabapentin (NEURONTIN) 300 MG capsule TAKE 1 CAPSULE(300 MG) BY MOUTH TWICE DAILY 180 capsule 1   hydrochlorothiazide (MICROZIDE) 12.5 MG capsule Take 1 capsule (12.5 mg total) by mouth daily. 90 capsule 3   icosapent Ethyl (VASCEPA) 1 g capsule Take 2 capsules (2 g total) by mouth 2 (two) times daily. 360 capsule 3   ketorolac (ACULAR) 0.4 % SOLN Place 1 drop into the left eye 4 (four) times daily.     magnesium oxide (MAG-OX) 400 (240 Mg) MG tablet TAKE 1 TABLET(400 MG) BY MOUTH DAILY 90 tablet 1   ofloxacin (OCUFLOX) 0.3 % ophthalmic solution Place 1 drop into the left eye 4 (four) times daily.     omeprazole (PRILOSEC) 20 MG capsule Take 1 capsule (20 mg total) by mouth 2 (two) times daily before a meal. 180 capsule 1   polyethylene glycol (MIRALAX / GLYCOLAX) 17 g packet Take 17 g by mouth daily. 14 each 0   prednisoLONE acetate (PRED FORTE) 1 % ophthalmic suspension Place 1 drop into the left eye 4 (four) times daily.     Rimegepant  Sulfate (NURTEC) 75 MG TBDP Take 1 tablet (75 mg total) by mouth as needed (take 1 tab as needed at the earlist on set of a Migraine. Max 1 tab in 24 hours). 8 tablet 5   risperiDONE (RISPERDAL) 1 MG tablet TAKE 1 TABLET(1 MG) BY MOUTH AT BEDTIME 90 tablet 0   rosuvastatin (CRESTOR) 20 MG tablet Take 1 tablet (20 mg total) by mouth daily. 90 tablet 3   thiamine (VITAMIN B1) 100 MG tablet Take 1 tablet (100 mg total) by mouth daily. 90 tablet 1   topiramate (TOPAMAX) 100 MG tablet TAKE 1 TABLET(100 MG) BY MOUTH TWICE DAILY 60 tablet 11   traMADol (ULTRAM) 50 MG tablet Take 1 tablet (50 mg total) by mouth every 12 (twelve) hours as needed. 60 tablet 2   Ubrogepant (UBRELVY) 100 MG TABS TAKE 1 TABLET(100 MG) BY MOUTH 1 TIME EVERY 2 HOURS AS DIRECTED AS NEEDED. MAXIMUM 2 TABLETS IN 24 HOURS 16 tablet 11   valACYclovir (VALTREX) 500 MG tablet TAKE 1 TABLET(500 MG) BY MOUTH DAILY 90 tablet 1   LORazepam (ATIVAN) 0.5 MG tablet 1-2 tabs 30 - 60 min prior to injection. Do not drive with this medicine. 4 tablet 0   tadalafil (CIALIS) 5 MG tablet SMARTSIG:1-4 Tablet(s) By Mouth Daily PRN  traZODone (DESYREL) 100 MG tablet Take 2 tablets (200 mg total) by mouth at bedtime. 180 tablet 0   No facility-administered medications prior to visit.    ROS Review of Systems  Constitutional: Negative.  Negative for chills, diaphoresis, fatigue and fever.  HENT: Negative.    Eyes: Negative.   Respiratory: Negative.  Negative for cough, chest tightness, shortness of breath and wheezing.   Cardiovascular:  Negative for chest pain, palpitations and leg swelling.  Gastrointestinal:  Negative for abdominal pain, constipation, diarrhea, nausea and vomiting.  Endocrine: Negative.   Genitourinary: Negative.  Negative for difficulty urinating and dysuria.  Musculoskeletal:  Positive for arthralgias. Negative for back pain and myalgias.  Skin: Negative.  Negative for color change and pallor.  Neurological:  Negative for  dizziness, weakness and light-headedness.  Hematological:  Negative for adenopathy. Does not bruise/bleed easily.  Psychiatric/Behavioral:  Positive for sleep disturbance. Negative for decreased concentration and dysphoric mood. The patient is nervous/anxious.     Objective:  BP 138/78 (BP Location: Left Arm, Patient Position: Sitting, Cuff Size: Large)   Pulse 65   Temp 97.8 F (36.6 C) (Oral)   Ht 5\' 6"  (1.676 m)   Wt 172 lb (78 kg)   SpO2 96%   BMI 27.76 kg/m   BP Readings from Last 3 Encounters:  01/01/23 138/78  11/19/22 137/81  11/12/22 139/81    Wt Readings from Last 3 Encounters:  01/01/23 172 lb (78 kg)  11/19/22 173 lb 11.2 oz (78.8 kg)  10/01/22 179 lb (81.2 kg)    Physical Exam Vitals reviewed.  HENT:     Nose: Nose normal.     Mouth/Throat:     Mouth: Mucous membranes are moist.  Eyes:     General: No scleral icterus.    Conjunctiva/sclera: Conjunctivae normal.  Cardiovascular:     Rate and Rhythm: Normal rate and regular rhythm. Occasional Extrasystoles are present.    Heart sounds: No murmur heard.    No gallop.     Comments: EKG- NSR, 64 bpm Normal EKG Pulmonary:     Effort: Pulmonary effort is normal.     Breath sounds: Examination of the right-lower field reveals rhonchi. Examination of the left-lower field reveals rhonchi. Rhonchi present. No decreased breath sounds, wheezing or rales.  Abdominal:     General: Abdomen is flat.     Palpations: There is no mass.     Tenderness: There is no abdominal tenderness. There is no guarding.     Hernia: No hernia is present. There is no hernia in the left inguinal area or right inguinal area.  Genitourinary:    Pubic Area: No rash.      Penis: Normal and circumcised.      Testes: Normal.     Epididymis:     Right: Normal.     Left: Normal.     Prostate: Normal. Not enlarged, not tender and no nodules present.     Rectum: Normal. Guaiac result negative. No mass, tenderness, anal fissure, external  hemorrhoid or internal hemorrhoid. Normal anal tone.  Musculoskeletal:     Cervical back: Neck supple.     Right lower leg: No edema.     Left lower leg: No edema.  Lymphadenopathy:     Cervical: No cervical adenopathy.     Lower Body: No right inguinal adenopathy. No left inguinal adenopathy.  Skin:    General: Skin is warm and dry.  Neurological:     General: No focal deficit present.  Mental Status: He is alert. Mental status is at baseline.  Psychiatric:        Mood and Affect: Mood normal.        Behavior: Behavior normal.     Lab Results  Component Value Date   WBC 10.6 (H) 01/01/2023   HGB 14.2 01/01/2023   HCT 41.0 01/01/2023   PLT 227.0 01/01/2023   GLUCOSE 75 01/01/2023   CHOL 112 01/01/2023   TRIG 159.0 (H) 01/01/2023   HDL 36.10 (L) 01/01/2023   LDLDIRECT 156 (H) 06/09/2011   LDLCALC 44 01/01/2023   ALT 13 01/01/2023   AST 13 01/01/2023   NA 142 01/01/2023   K 4.1 01/01/2023   CL 107 01/01/2023   CREATININE 1.24 01/01/2023   BUN 13 01/01/2023   CO2 28 01/01/2023   TSH 3.80 01/01/2023   PSA 1.08 01/01/2023   INR 1.0 01/01/2023   HGBA1C 5.8 (H) 12/03/2020    US Abdomen Complete  Result Date: 12/01/2022 CLINICAL DATA:  Cirrhosis follow-up EXAM: ABDOMEN ULTRASOUND COMPLETE COMPARISON:  Multiple ultrasound since October 19, 2018 FINDINGS: Gallbladder: No gallstones or wall thickening visualized. No sonographic Murphy sign noted by sonographer. Common bile duct: Diameter: 2.2 mm Liver: Nodular contour. Coarsened increased echotexture. No focal mass. Portal vein is patent on color Doppler imaging with normal direction of blood flow towards the liver. IVC: No abnormality visualized. Pancreas: Visualized portion unremarkable. Spleen: Size and appearance within normal limits. Right Kidney: Length: 10.5 cm. Echogenicity within normal limits. No mass or hydronephrosis visualized. Left Kidney: Length: 10.1 cm. Contains a 15 mm hypoechoic mass previously characterized  as a cyst. No follow-up imaging recommended for the cyst. Abdominal aorta: Calcified atherosclerotic change in the abdominal aorta. The proximal abdominal aorta measures 3.3 cm in caliber. Other findings: None. IMPRESSION: 1. Cirrhotic liver without focal mass. 2. 3.3 cm abdominal aortic aneurysm. Recommend follow-up every 3 years. Electronically Signed   By: Gerome Sam III M.D.   On: 12/01/2022 12:01    Assessment & Plan:  Anemia due to acquired thiamine deficiency -     CBC with Differential/Platelet; Future  Alcoholic cirrhosis of liver without ascites- His MELD score is reassuring at 8. -     Hepatic function panel; Future -     Protime-INR; Future  Hypertriglyceridemia -     Lipid panel; Future  HYPERTENSION, BENIGN SYSTEMIC- His BP is well controlled. -     TSH; Future -     Urinalysis, Routine w reflex microscopic; Future -     EKG 12-Lead  Hyperlipidemia LDL goal <70 - LDL goal achieved. Doing well on the statin  -     Lipid panel; Future  Stage 3b chronic kidney disease -     Urinalysis, Routine w reflex microscopic; Future -     Basic metabolic panel; Future  Vitamin B12 deficiency anemia due to intrinsic factor deficiency -     CBC with Differential/Platelet; Future -     Cyanocobalamin  Encounter for general adult medical examination with abnormal findings- Exam completed, labs reviewed, vaccines reviewed and updated, cancer screenings are up-to-date, patient education was given.  NSVT (nonsustained ventricular tachycardia)- He has good rate and rhythm control. -     TSH; Future  Screening for prostate cancer -     PSA; Future  Psychophysiological insomnia -     traZODone HCl; Take 2 tablets (200 mg total) by mouth at bedtime.  Dispense: 180 tablet; Refill: 1  Iron deficiency anemia secondary to inadequate  dietary iron intake -     IBC + Ferritin; Future     Follow-up: Return in about 6 months (around 07/03/2023).  Sanda Linger, MD

## 2023-01-03 ENCOUNTER — Encounter: Payer: Self-pay | Admitting: Internal Medicine

## 2023-01-12 ENCOUNTER — Ambulatory Visit: Payer: 59

## 2023-01-14 ENCOUNTER — Other Ambulatory Visit: Payer: Self-pay | Admitting: Internal Medicine

## 2023-01-14 DIAGNOSIS — M792 Neuralgia and neuritis, unspecified: Secondary | ICD-10-CM

## 2023-01-14 DIAGNOSIS — M19011 Primary osteoarthritis, right shoulder: Secondary | ICD-10-CM

## 2023-01-23 ENCOUNTER — Ambulatory Visit: Payer: 59 | Admitting: Podiatry

## 2023-01-25 ENCOUNTER — Other Ambulatory Visit: Payer: Self-pay | Admitting: Internal Medicine

## 2023-01-25 DIAGNOSIS — J418 Mixed simple and mucopurulent chronic bronchitis: Secondary | ICD-10-CM

## 2023-01-27 ENCOUNTER — Ambulatory Visit: Payer: 59 | Admitting: Podiatry

## 2023-02-02 ENCOUNTER — Ambulatory Visit (INDEPENDENT_AMBULATORY_CARE_PROVIDER_SITE_OTHER): Payer: 59

## 2023-02-02 DIAGNOSIS — E538 Deficiency of other specified B group vitamins: Secondary | ICD-10-CM

## 2023-02-02 MED ORDER — CYANOCOBALAMIN 1000 MCG/ML IJ SOLN
1000.0000 ug | Freq: Once | INTRAMUSCULAR | Status: AC
Start: 2023-02-02 — End: 2023-02-02
  Administered 2023-02-02: 1000 ug via INTRAMUSCULAR

## 2023-02-02 NOTE — Progress Notes (Signed)
After obtaining consent, and per orders of Dr. Jones, injection of B12 given by Delany Steury P Ledonna Dormer. Patient instructed to report any adverse reaction to me immediately.  

## 2023-02-08 ENCOUNTER — Ambulatory Visit (INDEPENDENT_AMBULATORY_CARE_PROVIDER_SITE_OTHER): Payer: 59

## 2023-02-08 ENCOUNTER — Ambulatory Visit (HOSPITAL_COMMUNITY)
Admission: EM | Admit: 2023-02-08 | Discharge: 2023-02-08 | Disposition: A | Discharge status: Home / Self Care | Payer: 59 | Attending: Emergency Medicine | Admitting: Emergency Medicine

## 2023-02-08 ENCOUNTER — Encounter (HOSPITAL_COMMUNITY): Payer: Self-pay

## 2023-02-08 DIAGNOSIS — M25531 Pain in right wrist: Secondary | ICD-10-CM

## 2023-02-08 DIAGNOSIS — L03113 Cellulitis of right upper limb: Secondary | ICD-10-CM | POA: Diagnosis not present

## 2023-02-08 DIAGNOSIS — M7989 Other specified soft tissue disorders: Secondary | ICD-10-CM | POA: Diagnosis not present

## 2023-02-08 MED ORDER — DOXYCYCLINE HYCLATE 100 MG PO CAPS: 100.0000 mg | ORAL_CAPSULE | Freq: Two times a day (BID) | ORAL | 0 refills | Status: DC

## 2023-02-08 NOTE — ED Triage Notes (Signed)
Patient here today with c/o posterior right wrist pain X 2 days. He notices redness and swelling. No known injury. He has tried an [employer] which helped some.

## 2023-02-08 NOTE — Discharge Instructions (Signed)
I am waiting for the official radiology over read of your x-rays, however, I have interpreted them as negative.  If there are any fractures or dislocations, I will call you and let you know.  I believe that your pain, inflammation, redness and swelling are due to a skin infection.  I am to put you on antibiotics for this, please take all antibiotics as prescribed until finished.  You can take them with food to help prevent gastrointestinal upset.  If you do not have any improvement over the next 3 days, please return to clinic or follow-up with your primary care provider.  Please seek immediate care for any new or concerning symptoms.

## 2023-02-08 NOTE — ED Provider Notes (Signed)
[facility]    CSN: [id number] Arrival date & time: 02/08/23  1623      History   Chief Complaint Chief Complaint  Patient presents with   Wrist Pain    HPI Jaime Barnes is a 72 y.o. male.   Patient presents to clinic for right wrist pain and swelling that has been ongoing for the past 2 days.  He is notices spreading of redness and swelling at the wrist.  No known injury.  No known bug bites or any lesions.  He has tried IcyHot with minor relief.  Denies history of gout.  Denies falls or trauma to the wrist. Does have a history of chronic kidney disease, cirrhosis, ganglion cyst removal to his right wrist, hypertension and hyperlipidemia.  Does have 2 suspicious spots on the dorsal area of his hand that were not there previously.  Unsure where these lesions came from.  Denies fevers.   The history is provided by the patient and medical records.  Wrist Pain Pertinent negatives include no chest pain, no abdominal pain and no shortness of breath.    Past Medical History:  Diagnosis Date   Barrett's esophagus 2011   Recommend repeat EGD for surveillance in 2 yrs-[facility]   Bipolar disorder (HCC)    Chronic kidney disease    Cirrhosis (HCC)    Diverticulosis of colon    GANGLION CYST, TENDON SHEATH 06/26/2010   Hyperlipidemia    Hypertension    Hypertriglyceridemia 01/21/2019   Impotence    Migraine    Pancreatitis    Pulmonary nodule    Pulmonary nodule seen on imaging study 02/06/2011   Followup CT in December 2014 with 20 months of stability at 4 mm.  No further imaging recommended     Patient Active Problem List   Diagnosis Date Noted   Flu vaccine need 07/01/2022   Mixed simple and mucopurulent chronic bronchitis (HCC) 07/01/2022   Anemia due to acquired thiamine deficiency 01/06/2022   Iron deficiency anemia secondary to inadequate dietary iron intake 01/01/2022   Encounter for general adult medical examination with abnormal findings 12/31/2021    Gastroesophageal reflux disease with esophagitis without hemorrhage 12/31/2021   Screening for prostate cancer 12/31/2021   Pseudogout of wrist, right 08/20/2021   Erectile dysfunction due to arterial insufficiency 05/07/2021   Vitamin B12 deficiency anemia due to intrinsic factor deficiency 04/14/2021   Carpal tunnel syndrome, right 03/06/2021   Stage 3b chronic kidney disease (HCC) 02/27/2021   Primary osteoarthritis of right shoulder 02/27/2021   PAD (peripheral artery disease) (HCC) 09/06/2020   NSVT (nonsustained ventricular tachycardia) (HCC) 09/06/2020   Hypertriglyceridemia 01/21/2019   Radiculopathy of lumbar region 08/19/2017   Exercise-induced bronchospasm 11/11/2016   Migraine without aura and without status migrainosus, not intractable 12/20/2015   Constipation 05/06/2015   Neuropathic pain 04/19/2015   Plaque psoriasis 08/24/2012   CAD (coronary artery disease) 03/28/2010   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 W/O OBSTRUCTION 11/26/2006   CIRRHOSIS, ALCOHOLIC 11/26/2006   Insomnia 11/26/2006    Past Surgical History:  Procedure Laterality Date   CARDIAC CATHETERIZATION  2005   Nonobstructive, <40%   TONSILLECTOMY Bilateral 1961       Home Medications    Prior to Admission medications   Medication Sig Start  Date End Date Taking? Authorizing Provider  amLODipine (NORVASC) 10 MG tablet TAKE 1 TABLET(10 MG) BY MOUTH DAILY 06/09/22  Yes Alver Sorrow, NP  aspirin 81 MG chewable tablet Chew 81 mg by mouth daily.   Yes [provider]  doxycycline (VIBRAMYCIN) 100 MG capsule Take 1 capsule (100 mg total) by mouth 2 (two) times daily. 02/08/23  Yes Rinaldo Ratel, [place] N, FNP  hydrochlorothiazide (MICROZIDE) 12.5 MG capsule Take 1  capsule (12.5 mg total) by mouth daily. 06/09/22  Yes Alver Sorrow, NP  icosapent Ethyl (VASCEPA) 1 g capsule Take 2 capsules (2 g total) by mouth 2 (two) times daily. 06/09/22  Yes Alver Sorrow, NP  albuterol (VENTOLIN HFA) 108 (90 Base) MCG/ACT inhaler INHALE 1 TO 2 PUFFS INTO THE LUNGS EVERY 6 HOURS AS NEEDED FOR WHEEZING OR SHORTNESS OF BREATH 03/11/22   Etta Grandchild, MD  BREZTRI AEROSPHERE 160-9-4.8 MCG/ACT AERO INHALE 2 PUFFS INTO THE LUNGS TWICE DAILY 01/25/23   Etta Grandchild, MD  Ferric Maltol (ACCRUFER) 30 MG CAPS Take 1 capsule by mouth in the morning and at bedtime. 07/01/22   Etta Grandchild, MD  gabapentin (NEURONTIN) 300 MG capsule TAKE 1 CAPSULE(300 MG) BY MOUTH TWICE DAILY 11/18/22   Etta Grandchild, MD  magnesium oxide (MAG-OX) 400 (240 Mg) MG tablet TAKE 1 TABLET(400 MG) BY MOUTH DAILY 12/12/22   Chilton Si, MD  omeprazole (PRILOSEC) 20 MG capsule Take 1 capsule (20 mg total) by mouth 2 (two) times daily before a meal. 07/01/22   Etta Grandchild, MD  polyethylene glycol (MIRALAX / GLYCOLAX) 17 g packet Take 17 g by mouth daily. 07/16/21   Curatolo, Adam, DO  Rimegepant Sulfate (NURTEC) 75 MG TBDP Take 1 tablet (75 mg total) by mouth as needed (take 1 tab as needed at the earlist on set of a Migraine. Max 1 tab in 24 hours). 12/11/22   Drema Dallas, DO  risperiDONE (RISPERDAL) 1 MG tablet TAKE 1 TABLET(1 MG) BY MOUTH AT BEDTIME 09/11/22   Etta Grandchild, MD  rosuvastatin (CRESTOR) 20 MG tablet Take 1 tablet (20 mg total) by mouth daily. 06/09/22 06/04/23  Alver Sorrow, NP  thiamine (VITAMIN B1) 100 MG tablet Take 1 tablet (100 mg total) by mouth daily. 07/01/22   Etta Grandchild, MD  topiramate (TOPAMAX) 100 MG tablet TAKE 1 TABLET(100 MG) BY MOUTH TWICE DAILY 11/19/22   Everlena Cooper, Adam R, DO  traMADol (ULTRAM) 50 MG tablet TAKE 1 TABLET(50 MG) BY MOUTH EVERY 12 HOURS AS NEEDED 01/14/23   Etta Grandchild, MD  traZODone (DESYREL) 100 MG tablet Take 2 tablets (200 mg total) by  mouth at bedtime. 01/01/23   Etta Grandchild, MD  Ubrogepant (UBRELVY) 100 MG TABS TAKE 1 TABLET(100 MG) BY MOUTH 1 TIME EVERY 2 HOURS AS DIRECTED AS NEEDED. MAXIMUM 2 TABLETS IN 24 HOURS 11/19/22   Drema Dallas, DO  valACYclovir (VALTREX) 500 MG tablet TAKE 1 TABLET(500 MG) BY MOUTH DAILY 07/18/22   Etta Grandchild, MD    Family History Family History  Problem Relation Age of Onset   Anemia Mother    Hypertension Daughter    Diabetes Daughter    Migraines Daughter     Social History Social History   Tobacco Use   Smoking status: Light Smoker    Types: Cigars   Smokeless tobacco: Former    Quit date: 03/08/2017   Tobacco comments:    cigar occassionally  Vaping Use   Vaping Use: Never used  Substance Use Topics   Alcohol use: No    Comment: Last drink "30 years ago"   Drug use: Yes    Types: Marijuana    Comment: daily marijuana since teenager.      Allergies   Other, Codeine phosphate, and Penicillins   Review of Systems Review of Systems  Constitutional:  Negative for chills, fatigue and fever.  Respiratory:  Negative for cough and shortness of breath.   Cardiovascular:  Negative for chest pain.  Gastrointestinal:  Negative for abdominal pain.  Musculoskeletal:  Positive for joint swelling.  Skin:  Positive for rash.     Physical Exam Triage Vital Signs ED Triage Vitals  Enc Vitals Group     BP 02/08/23 1650 137/79     Pulse Rate 02/08/23 1650 79     Resp 02/08/23 1650 16     Temp 02/08/23 1650 98.4 F (36.9 C)     Temp Source 02/08/23 1650 Oral     SpO2 02/08/23 1650 95 %     Weight 02/08/23 1649 172 lb (78 kg)     Height 02/08/23 1649 5\' 6"  (1.676 m)     Head Circumference --      Peak Flow --      Pain Score 02/08/23 1649 8     Pain Loc --      Pain Edu? --      Excl. in GC? --    No data found.  Updated Vital Signs BP 137/79 (BP Location: Left Arm)   Pulse 79   Temp 98.4 F (36.9 C) (Oral)   Resp 16   Ht 5\' 6"  (1.676 m)   Wt 172 lb  (78 kg)   SpO2 95%   BMI 27.76 kg/m   Visual Acuity Right Eye Distance:   Left Eye Distance:   Bilateral Distance:    Right Eye Near:   Left Eye Near:    Bilateral Near:     Physical Exam Vitals and nursing note reviewed.  Constitutional:      Appearance: Normal appearance.  HENT:     Head: Normocephalic and atraumatic.     Right Ear: External ear normal.     Left Ear: External ear normal.     Nose: Nose normal.     Mouth/Throat:     Mouth: Mucous membranes are moist.  Eyes:     Conjunctiva/sclera: Conjunctivae normal.  Cardiovascular:     Rate and Rhythm: Normal rate.     Pulses: Normal pulses.  Pulmonary:     Effort: Pulmonary effort is normal. No respiratory distress.  Musculoskeletal:        General: Swelling and tenderness present. No deformity or signs of injury. Normal range of motion.  Skin:    General: Skin is warm.     Capillary Refill: Capillary refill takes less than 2 seconds.     Findings: Erythema present.  Neurological:     General: No focal deficit present.     Mental Status: He is alert and oriented to person, place, and time.  Psychiatric:        Mood and Affect: Mood normal.        Behavior: Behavior normal. Behavior is cooperative.      UC Treatments / Results  Labs (all labs ordered are listed, but only abnormal results are displayed) Labs Reviewed - No data to display  EKG   Radiology No results found.  Procedures Procedures (including  critical care time)  Medications Ordered in UC Medications - No data to display  Initial Impression / Assessment and Plan / UC Course  I have reviewed the triage vital signs and the nursing notes.  Pertinent labs & imaging results that were available during my care of the patient were reviewed by me and considered in my medical decision making (see chart for details).  Vitals and triage reviewed, patient is hemodynamically stable.  Right wrist with erythema, warmth and swelling.  No known  injury.  Imaging personally interpreted by me, without any fractures or dislocations.  Awaiting radiology overread, will contact patient if anything is abnormal.  Two small lesions on his hand, suspect cellulitis from these lesions/bites.  Will cover with doxycycline.  Plan of care, return precautions, and emergency precautions discussed, no questions at this time.     Final Clinical Impressions(s) / UC Diagnoses   Final diagnoses:  Right wrist pain  Cellulitis of right upper extremity     Discharge Instructions      I am waiting for the official radiology over read of your x-rays, however, I have interpreted them as negative.  If there are any fractures or dislocations, I will call you and let you know.  I believe that your pain, inflammation, redness and swelling are due to a skin infection.  I am to put you on antibiotics for this, please take all antibiotics as prescribed until finished.  You can take them with food to help prevent gastrointestinal upset.  If you do not have any improvement over the next 3 days, please return to clinic or follow-up with your primary care provider.  Please seek immediate care for any new or concerning symptoms.      ED Prescriptions     Medication Sig Dispense Auth. Provider   doxycycline (VIBRAMYCIN) 100 MG capsule Take 1 capsule (100 mg total) by mouth 2 (two) times daily. 20 capsule Shanoah Asbill, [place] N, [place]      PDMP not reviewed this encounter.   Miriana Gaertner, [place] N, [place] 02/08/23 [phone]

## 2023-02-09 ENCOUNTER — Encounter: Payer: Self-pay | Admitting: Podiatry

## 2023-02-09 ENCOUNTER — Ambulatory Visit (INDEPENDENT_AMBULATORY_CARE_PROVIDER_SITE_OTHER): Payer: 59 | Admitting: Podiatry

## 2023-02-09 DIAGNOSIS — M79674 Pain in right toe(s): Secondary | ICD-10-CM

## 2023-02-09 DIAGNOSIS — B351 Tinea unguium: Secondary | ICD-10-CM

## 2023-02-09 DIAGNOSIS — M79675 Pain in left toe(s): Secondary | ICD-10-CM | POA: Diagnosis not present

## 2023-02-09 DIAGNOSIS — N1832 Chronic kidney disease, stage 3b: Secondary | ICD-10-CM

## 2023-02-09 NOTE — Progress Notes (Signed)
This patient returns to my office for at risk foot care.  This patient requires this care by a professional since this patient will be at risk due to having chronic kidney disease  This patient is unable to cut nails himself since the patient cannot reach his nails.These nails are painful walking and wearing shoes.  This patient presents for at risk foot care today.  General Appearance  Alert, conversant and in no acute stress.  Vascular  Dorsalis pedis and posterior tibial  pulses are weakly  palpable  bilaterally.  Capillary return is within normal limits  bilaterally. Temperature is within normal limits  bilaterally.  Neurologic  Senn-Weinstein monofilament wire test within normal limits  bilaterally. Muscle power within normal limits bilaterally.  Nails Thick disfigured discolored nails with subungual debris  from hallux to fifth toes bilaterally. No evidence of bacterial infection or drainage bilaterally.  Orthopedic  No limitations of motion  feet .  No crepitus or effusions noted.  No bony pathology or digital deformities noted.  Skin  normotropic skin with no porokeratosis noted bilaterally.  No signs of infections or ulcers noted.     Onychomycosis  Pain in right toes  Pain in left toes  Consent was obtained for treatment procedures.   Mechanical debridement of nails 1-5  bilaterally performed with a nail nipper.  Filed with dremel without incident.    Return office visit   12  weeks                  Told patient to return for periodic foot care and evaluation due to potential at risk complications.   Helane Gunther DPM

## 2023-02-12 ENCOUNTER — Encounter: Payer: Self-pay | Admitting: Emergency Medicine

## 2023-02-12 ENCOUNTER — Ambulatory Visit (INDEPENDENT_AMBULATORY_CARE_PROVIDER_SITE_OTHER): Payer: 59 | Admitting: Emergency Medicine

## 2023-02-12 VITALS — BP 126/74 | HR 70 | Temp 98.0°F | Ht 66.0 in | Wt 174.5 lb

## 2023-02-12 DIAGNOSIS — M1120 Other chondrocalcinosis, unspecified site: Secondary | ICD-10-CM | POA: Diagnosis not present

## 2023-02-12 DIAGNOSIS — M25531 Pain in right wrist: Secondary | ICD-10-CM | POA: Diagnosis not present

## 2023-02-12 DIAGNOSIS — N1832 Chronic kidney disease, stage 3b: Secondary | ICD-10-CM

## 2023-02-12 DIAGNOSIS — M19031 Primary osteoarthritis, right wrist: Secondary | ICD-10-CM | POA: Diagnosis not present

## 2023-02-12 MED ORDER — PREDNISONE 20 MG PO TABS
40.0000 mg | ORAL_TABLET | Freq: Every day | ORAL | 0 refills | Status: AC
Start: 2023-02-12 — End: 2023-02-17

## 2023-02-12 NOTE — Assessment & Plan Note (Addendum)
Creating inflammation and pain.  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 Recommend to start prednisone 40 mg daily for 5 days May take Tylenol for pain Advised to contact the office if no better or worse during the next several days

## 2023-02-12 NOTE — Patient Instructions (Signed)
Calcium Pyrophosphate Deposition Disease Calcium pyrophosphate deposition disease (CPPD) is a type of arthritis that causes pain, swelling, and inflammation in a joint. Attacks of CPPD may come and go. The joint pain can be severe and may last for days to weeks. This condition usually affects one joint at a time. The knees are most often affected, but this condition can also affect the wrists, elbows, shoulders, or ankles. CPPD may also be called pseudogout because it is similar to gout. Both conditions result from the buildup of crystals in a joint. However, CPPD is caused by a type of crystal that is different from the crystals that cause gout. What are the causes? This condition is caused by the buildup of calcium pyrophosphate dihydrate crystals in a joint. The reason why this buildup occurs is not known. An increased likelihood of having this condition (predisposition) may be passed from parent to child (is hereditary). What increases the risk? This condition is more likely to develop in people who: Are older than 73 years of age. Have a family history of CPPD. Have certain medical conditions, such as hemophilia, amyloidosis, or overactive parathyroid glands. Have had a previous joint injury or surgery on a joint. Have low levels of magnesium in the blood. What are the signs or symptoms? Symptoms of this condition include: Joint pain. The pain may: Be intense and constant. Develop quickly. Get worse with movement. Last from several days to a few weeks. Redness, swelling, stiffness, and warmth at the joint. Fever. How is this diagnosed? To diagnose this condition, your health care provider will use a needle to remove fluid from the joint. The fluid will be examined for the crystals that cause CPPD. You also may have additional tests, such as: Blood tests. X-rays. Ultrasound. MRI. How is this treated? There is no cure for this condition. However, treatment can relieve symptoms and  improve joint function. Treatment may include: NSAIDs to reduce inflammation and pain, such as ibuprofen. Removing some of the fluid from around the joint with a needle. Injections of medicine (cortisone) into the joint to reduce pain and swelling. Medicines to help prevent attacks. Physical therapy exercises to improve movement and strength in the joint. Follow these instructions at home: Managing pain, stiffness, and swelling  Rest the affected joint until your symptoms start to go away. If directed, put ice on the affected area to relieve pain and swelling. To do this: Put ice in a plastic bag. Place a towel between your skin and the bag. Leave the ice on for 20 minutes, 2-3 times a day. Remove the ice if your skin turns bright red. This is very important. If you cannot feel pain, heat, or cold, you have a greater risk of damage to the area. Keep your affected joint raised (elevated) above the level of your heart, when possible. This will help to reduce swelling. For example, prop your foot up on a chair while sitting down to elevate your knee. General instructions If the painful joint is in your leg, use crutches as told by your health care provider. Take over-the-counter and prescription medicines only as told by your health care provider. When your symptoms start to go away, begin to exercise regularly or do physical therapy. Talk with your health care provider or physical therapist about what types of exercise are safe for you. Exercise that is easier on your joints (low-impact exercise) may be best. This includes walking, swimming, bicycling, and water aerobics. Maintain a healthy weight. Excess weight puts stress   on your joints. Keep all follow-up visits. This is important. Where to find more information [employer]: www.arthritis.org Contact a health care provider if: Your pain or other symptoms get worse. You develop a skin rash. You have chills or a fever. You have  side effects from your medicines. Summary Calcium pyrophosphate deposition (CPPD) is a type of arthritis that causes pain, swelling, and inflammation in a joint. The knees are most often affected, but CPPD can also affect the wrists, elbows, shoulders, or ankles. CPPD is caused by the buildup of calcium crystals in a joint. The reason why this occurs is not known. Attacks of CPPD may come and go. The joint pain can be severe and may last for days to weeks. There is no way to remove the crystals from the joint and no cure for this condition. However, treatment can relieve symptoms and improve joint function. Rest the affected joint until your symptoms start to go away. After your symptoms go away, begin to exercise regularly or do physical therapy. This information is not intended to replace advice given to you by your health care provider. Make sure you discuss any questions you have with your health care provider. Document Revised: 06/19/2021 Document Reviewed: 06/19/2021 Elsevier Patient Education  2023 [employer]  

## 2023-02-12 NOTE — Assessment & Plan Note (Signed)
Advised to stay well-hydrated and avoid NSAIDs. ?

## 2023-02-12 NOTE — Assessment & Plan Note (Signed)
Inflammation noted on physical exam. No history of gout or inflammatory arthritides X-ray suggestive of CPPD Doubt infection but continue doxycycline which was started 2 days ago Pain management discussed Advised to avoid NSAIDs due to CKD

## 2023-02-12 NOTE — Progress Notes (Signed)
Jaime Barnes 72 y.o.   Chief Complaint  Patient presents with   Arm Pain    Right arm pain, swollen, x 4 days, he was seen at urgent care, took x ray     HISTORY OF PRESENT ILLNESS: Acute problem visit today.  Patient of Dr. Sanda Linger. This is a 72 y.o. male complaining of pain and swelling to right wrist that started about 4 days ago.  Denies injury. Was seen at urgent care center on 02/08/2023 started on doxycycline for possible cellulitis. Wrist x-ray showed arthropathy. Still having pain, swelling, and inflammation. No new symptoms.  Arm Pain  Pertinent negatives include no chest pain.     Prior to Admission medications   Medication Sig Start Date End Date Taking? Authorizing Provider  albuterol (VENTOLIN HFA) 108 (90 Base) MCG/ACT inhaler INHALE 1 TO 2 PUFFS INTO THE LUNGS EVERY 6 HOURS AS NEEDED FOR WHEEZING OR SHORTNESS OF BREATH 03/11/22  Yes Etta Grandchild, MD  amLODipine (NORVASC) 10 MG tablet TAKE 1 TABLET(10 MG) BY MOUTH DAILY 06/09/22  Yes Alver Sorrow, NP  aspirin 81 MG chewable tablet Chew 81 mg by mouth daily.   Yes [provider]  BREZTRI AEROSPHERE 160-9-4.8 MCG/ACT AERO INHALE 2 PUFFS INTO THE LUNGS TWICE DAILY 01/25/23  Yes Etta Grandchild, MD  doxycycline (VIBRAMYCIN) 100 MG capsule Take 1 capsule (100 mg total) by mouth 2 (two) times daily. 02/08/23  Yes Rinaldo Ratel, [place] N, FNP  Ferric Maltol (ACCRUFER) 30 MG CAPS Take 1 capsule by mouth in the morning and at bedtime. 07/01/22  Yes Etta Grandchild, MD  gabapentin (NEURONTIN) 300 MG capsule TAKE 1 CAPSULE(300 MG) BY MOUTH TWICE DAILY 11/18/22  Yes Etta Grandchild, MD  hydrochlorothiazide (MICROZIDE) 12.5 MG capsule Take 1 capsule (12.5 mg total) by mouth daily. 06/09/22  Yes Alver Sorrow, NP  icosapent Ethyl (VASCEPA) 1 g capsule Take 2 capsules (2 g total) by mouth 2 (two) times daily. 06/09/22  Yes Alver Sorrow, NP  magnesium oxide (MAG-OX) 400 (240 Mg) MG tablet TAKE 1 TABLET(400 MG) BY  MOUTH DAILY 12/12/22  Yes Chilton Si, MD  omeprazole (PRILOSEC) 20 MG capsule Take 1 capsule (20 mg total) by mouth 2 (two) times daily before a meal. 07/01/22  Yes Etta Grandchild, MD  polyethylene glycol (MIRALAX / GLYCOLAX) 17 g packet Take 17 g by mouth daily. 07/16/21  Yes Curatolo, Adam, DO  predniSONE (DELTASONE) 20 MG tablet Take 2 tablets (40 mg total) by mouth daily with breakfast for 5 days. 02/12/23 02/17/23 Yes Lavonne Kinderman, Eilleen Kempf, MD  Rimegepant Sulfate (NURTEC) 75 MG TBDP Take 1 tablet (75 mg total) by mouth as needed (take 1 tab as needed at the earlist on set of a Migraine. Max 1 tab in 24 hours). 12/11/22  Yes Jaffe, Adam R, DO  risperiDONE (RISPERDAL) 1 MG tablet TAKE 1 TABLET(1 MG) BY MOUTH AT BEDTIME 09/11/22  Yes Etta Grandchild, MD  rosuvastatin (CRESTOR) 20 MG tablet Take 1 tablet (20 mg total) by mouth daily. 06/09/22 06/04/23 Yes Alver Sorrow, NP  thiamine (VITAMIN B1) 100 MG tablet Take 1 tablet (100 mg total) by mouth daily. 07/01/22  Yes Etta Grandchild, MD  topiramate (TOPAMAX) 100 MG tablet TAKE 1 TABLET(100 MG) BY MOUTH TWICE DAILY 11/19/22  Yes Jaffe, Adam R, DO  traMADol (ULTRAM) 50 MG tablet TAKE 1 TABLET(50 MG) BY MOUTH EVERY 12 HOURS AS NEEDED 01/14/23  Yes Etta Grandchild, MD  traZODone (  DESYREL) 100 MG tablet Take 2 tablets (200 mg total) by mouth at bedtime. 01/01/23  Yes Etta Grandchild, MD  Ubrogepant (UBRELVY) 100 MG TABS TAKE 1 TABLET(100 MG) BY MOUTH 1 TIME EVERY 2 HOURS AS DIRECTED AS NEEDED. MAXIMUM 2 TABLETS IN 24 HOURS 11/19/22  Yes Everlena Cooper, Adam R, DO  valACYclovir (VALTREX) 500 MG tablet TAKE 1 TABLET(500 MG) BY MOUTH DAILY 07/18/22  Yes Etta Grandchild, MD    Allergies  Allergen Reactions   Other     Other reaction(s): rash, sweat, nausea Other reaction(s): rash, sweat, nausea   Codeine Phosphate Other (See Comments)    REACTION: breathing difficulty   Penicillins Rash and Other (See Comments)    Break out in sweats/ Rash amoxicillin. Denies  airway involvement Has patient had a PCN reaction causing immediate rash, facial/tongue/throat swelling, SOB or lightheadedness with hypotension: Yes Has patient had a PCN reaction causing severe rash involving mucus membranes or skin necrosis: Yes Has patient had a PCN reaction that required hospitalization No Has patient had a PCN reaction occurring within the last 10 years: No If all of the above answers are "NO", then may proceed with Cephalosporin u    Patient Active Problem List   Diagnosis Date Noted   Flu vaccine need 07/01/2022   Mixed simple and mucopurulent chronic bronchitis (HCC) 07/01/2022   Anemia due to acquired thiamine deficiency 01/06/2022   Iron deficiency anemia secondary to inadequate dietary iron intake 01/01/2022   Encounter for general adult medical examination with abnormal findings 12/31/2021   Gastroesophageal reflux disease with esophagitis without hemorrhage 12/31/2021   Screening for prostate cancer 12/31/2021   Pseudogout of wrist, right 08/20/2021   Erectile dysfunction due to arterial insufficiency 05/07/2021   Vitamin B12 deficiency anemia due to intrinsic factor deficiency 04/14/2021   Carpal tunnel syndrome, right 03/06/2021   Stage 3b chronic kidney disease (HCC) 02/27/2021   Primary osteoarthritis of right shoulder 02/27/2021   PAD (peripheral artery disease) (HCC) 09/06/2020   NSVT (nonsustained ventricular tachycardia) (HCC) 09/06/2020   Hypertriglyceridemia 01/21/2019   Radiculopathy of lumbar region 08/19/2017   Exercise-induced bronchospasm 11/11/2016   Migraine without aura and without status migrainosus, not intractable 12/20/2015   Constipation 05/06/2015   Neuropathic pain 04/19/2015   Plaque psoriasis 08/24/2012   CAD (coronary artery disease) 03/28/2010   HSV-2 (herpes simplex virus 2) infection 06/29/2009   BARRETTS ESOPHAGUS 06/12/2008   TOBACCO USER 03/11/2007   Hyperlipidemia LDL goal <70 11/26/2006   Bipolar 1 disorder (HCC)  11/26/2006   HYPERTENSION, BENIGN SYSTEMIC 11/26/2006   PEPTIC ULCER DIS., UNSPEC. W/O OBSTRUCTION 11/26/2006   CIRRHOSIS, ALCOHOLIC 11/26/2006   Insomnia 11/26/2006    Past Medical History:  Diagnosis Date   Barrett's esophagus 2011   Recommend repeat EGD for surveillance in 2 yrs-[facility]   Bipolar disorder (HCC)    Chronic kidney disease    Cirrhosis (HCC)    Diverticulosis of colon    GANGLION CYST, TENDON SHEATH 06/26/2010   Hyperlipidemia    Hypertension    Hypertriglyceridemia 01/21/2019   Impotence    Migraine    Pancreatitis    Pulmonary nodule    Pulmonary nodule seen on imaging study 02/06/2011   Followup CT in December 2014 with 20 months of stability at 4 mm.  No further imaging recommended     Past Surgical History:  Procedure Laterality Date   CARDIAC CATHETERIZATION  2005   Nonobstructive, <40%   TONSILLECTOMY Bilateral 1961    Social History  Socioeconomic History   Marital status: Single    Spouse name: Not on file   Number of children: 8   Years of education: 8   Highest education level: 8th grade  Occupational History   Not on file  Tobacco Use   Smoking status: Light Smoker    Types: Cigars   Smokeless tobacco: Former    Quit date: 03/08/2017   Tobacco comments:    cigar occassionally   Vaping Use   Vaping Use: Never used  Substance and Sexual Activity   Alcohol use: No    Comment: Last drink "30 years ago"   Drug use: Yes    Types: Marijuana    Comment: daily marijuana since teenager.    Sexual activity: Yes    Birth control/protection: Condom    Comment: Multiple sex partners. 60 in last year.  Other Topics Concern   Not on file  Social History Narrative   Pt lives alone. No stairs in home. Pt has completed 8th grade.       Caffeine 3 cups coffee day      Exercise - yes walks and plays tennis   Social Determinants of Health   Financial Resource Strain: Low Risk  (09/01/2022)   Overall Financial Resource Strain (CARDIA)     Difficulty of Paying Living Expenses: Not hard at all  Food Insecurity: No Food Insecurity (09/01/2022)   Hunger Vital Sign    Worried About Running Out of Food in the Last Year: Never true    Ran Out of Food in the Last Year: Never true  Transportation Needs: No Transportation Needs (09/01/2022)   PRAPARE - Administrator, Civil Service (Medical): No    Lack of Transportation (Non-Medical): No  Physical Activity: Sufficiently Active (09/01/2022)   Exercise Vital Sign    Days of Exercise per Week: 7 days    Minutes of Exercise per Session: 30 min  Stress: No Stress Concern Present (09/01/2022)   [employer] of Occupational Health - Occupational Stress Questionnaire    Feeling of Stress : Not at all  Social Connections: Socially Integrated (09/01/2022)   Social Connection and Isolation Panel [NHANES]    Frequency of Communication with Friends and Family: More than three times a week    Frequency of Social Gatherings with Friends and Family: More than three times a week    Attends Religious Services: 1 to 4 times per year    Active Member of [employer] or Organizations: Yes    Attends Banker Meetings: Never    Marital Status: Living with partner  Intimate Partner Violence: Not At Risk (09/01/2022)   Humiliation, Afraid, Rape, and Kick questionnaire    Fear of Current or Ex-Partner: No    Emotionally Abused: No    Physically Abused: No    Sexually Abused: No    Family History  Problem Relation Age of Onset   Anemia Mother    Hypertension Daughter    Diabetes Daughter    Migraines Daughter      Review of Systems  Constitutional: Negative.  Negative for chills and fever.  HENT:  Negative for congestion and sore throat.   Respiratory: Negative.  Negative for cough and shortness of breath.   Cardiovascular: Negative.  Negative for chest pain and palpitations.  Gastrointestinal:  Negative for abdominal pain, diarrhea, nausea and vomiting.  Musculoskeletal:   Positive for joint pain.  Skin:  Positive for rash.  Neurological: Negative.  Negative for dizziness and headaches.  All other systems reviewed and are negative.   Vitals:   02/12/23 1026  BP: 126/74  Pulse: 70  Temp: 98 F (36.7 C)  SpO2: 97%    Physical Exam Vitals reviewed.  Constitutional:      Appearance: Normal appearance.  HENT:     Head: Normocephalic.  Eyes:     Extraocular Movements: Extraocular movements intact.  Cardiovascular:     Rate and Rhythm: Normal rate.  Pulmonary:     Effort: Pulmonary effort is normal.  Musculoskeletal:     Comments: Right wrist/hand: Warm to touch.  Positive erythema.  Mild swelling.  Mild tenderness. Limited range of motion due to pain.  Neurovascularly intact. Good pulses and good sensation  Skin:    General: Skin is warm and dry.  Neurological:     Mental Status: He is alert and oriented to person, place, and time.  Psychiatric:        Mood and Affect: Mood normal.        Behavior: Behavior normal.      DG Wrist Complete Right  Result Date: 02/08/2023 CLINICAL DATA:  pain, swelling EXAM: RIGHT WRIST - COMPLETE 3+ VIEW COMPARISON:  Right wrist radiograph 08/06/2021. FINDINGS: There is no evidence of acute fracture. Alignment is normal. Radiocarpal and TFCC chondrocalcinosis. There is punctate soft tissue mineralization along the ulnar aspect of the base of the fifth metacarpal. There is irregular lucencies within the distal ulnar metadiaphysis with intracortical lucency, similar to prior radiograph in November 2022. No cortical breakthrough. IMPRESSION: No evidence of acute fracture or dislocation. Radiocarpal and TFCC chondrocalcinosis with soft tissue mineralization adjacent to the fifth metacarpal base could suggest underlying CPPD arthropathy. Lucent lesion within the distal ulnar metadiaphysis with intracortical lucency but no cortical breakthrough, similar in appearance to prior radiograph in November 2022, most likely  benign. Recommend right forearm radiographs. Electronically Signed   By: Caprice Renshaw M.D.   On: 02/08/2023 18:10    ASSESSMENT & PLAN: A total of 43 minutes was spent with the patient and counseling/coordination of care regarding preparing for this visit, review of most recent office visit notes, review of multiple chronic medical conditions under management, review of most recent urgent care visit notes, review of most recent x-ray report, diagnosis of inflammatory arthropathy, pain management, need for blood work today, prognosis, documentation, and need for follow-up.   Problem List Items Addressed This Visit       Musculoskeletal and Integument   Calcium pyrophosphate deposition disease (CPPD)    As seen on recent wrist x-ray Recommend to start prednisone 40 mg daily for 5 days for treatment of acute inflammation.  Unknown trigger. Blood work done today including uric acid levels.  Doubt gout however.      Relevant Medications   predniSONE (DELTASONE) 20 MG tablet   Arthropathy of right wrist - Primary    Creating inflammation and pain Recommend to start prednisone 40 mg daily for 5 days May take Tylenol for pain Advised to contact the office if no better or worse during the next several days      Relevant Medications   predniSONE (DELTASONE) 20 MG tablet   Other Relevant Orders   CBC with Differential/Platelet   Comprehensive metabolic panel   Uric acid     Genitourinary   Stage 3b chronic kidney disease (HCC)    Advised to stay well-hydrated and avoid NSAIDs        Other   Right wrist pain    Inflammation noted  on physical exam. No history of gout or inflammatory arthritides X-ray suggestive of CPPD Doubt infection but continue doxycycline which was started 2 days ago Pain management discussed Advised to avoid NSAIDs due to CKD      Relevant Medications   predniSONE (DELTASONE) 20 MG tablet   Other Relevant Orders   CBC with Differential/Platelet    Comprehensive metabolic panel   Uric acid   Patient Instructions  Calcium Pyrophosphate Deposition Disease Calcium pyrophosphate deposition disease (CPPD) is a type of arthritis that causes pain, swelling, and inflammation in a joint. Attacks of CPPD may come and go. The joint pain can be severe and may last for days to weeks. This condition usually affects one joint at a time. The knees are most often affected, but this condition can also affect the wrists, elbows, shoulders, or ankles. CPPD may also be called pseudogout because it is similar to gout. Both conditions result from the buildup of crystals in a joint. However, CPPD is caused by a type of crystal that is different from the crystals that cause gout. What are the causes? This condition is caused by the buildup of calcium pyrophosphate dihydrate crystals in a joint. The reason why this buildup occurs is not known. An increased likelihood of having this condition (predisposition) may be passed from parent to child (is hereditary). What increases the risk? This condition is more likely to develop in people who: Are older than 73 years of age. Have a family history of CPPD. Have certain medical conditions, such as hemophilia, amyloidosis, or overactive parathyroid glands. Have had a previous joint injury or surgery on a joint. Have low levels of magnesium in the blood. What are the signs or symptoms? Symptoms of this condition include: Joint pain. The pain may: Be intense and constant. Develop quickly. Get worse with movement. Last from several days to a few weeks. Redness, swelling, stiffness, and warmth at the joint. Fever. How is this diagnosed? To diagnose this condition, your health care provider will use a needle to remove fluid from the joint. The fluid will be examined for the crystals that cause CPPD. You also may have additional tests, such as: Blood tests. X-rays. Ultrasound. MRI. How is this treated? There is no  cure for this condition. However, treatment can relieve symptoms and improve joint function. Treatment may include: NSAIDs to reduce inflammation and pain, such as ibuprofen. Removing some of the fluid from around the joint with a needle. Injections of medicine (cortisone) into the joint to reduce pain and swelling. Medicines to help prevent attacks. Physical therapy exercises to improve movement and strength in the joint. Follow these instructions at home: Managing pain, stiffness, and swelling  Rest the affected joint until your symptoms start to go away. If directed, put ice on the affected area to relieve pain and swelling. To do this: Put ice in a plastic bag. Place a towel between your skin and the bag. Leave the ice on for 20 minutes, 2-3 times a day. Remove the ice if your skin turns bright red. This is very important. If you cannot feel pain, heat, or cold, you have a greater risk of damage to the area. Keep your affected joint raised (elevated) above the level of your heart, when possible. This will help to reduce swelling. For example, prop your foot up on a chair while sitting down to elevate your knee. General instructions If the painful joint is in your leg, use crutches as told by your health care provider.  Take over-the-counter and prescription medicines only as told by your health care provider. When your symptoms start to go away, begin to exercise regularly or do physical therapy. Talk with your health care provider or physical therapist about what types of exercise are safe for you. Exercise that is easier on your joints (low-impact exercise) may be best. This includes walking, swimming, bicycling, and water aerobics. Maintain a healthy weight. Excess weight puts stress on your joints. Keep all follow-up visits. This is important. Where to find more information Arthritis Foundation: www.arthritis.org Contact a health care provider if: Your pain or other symptoms get  worse. You develop a skin rash. You have chills or a fever. You have side effects from your medicines. Summary Calcium pyrophosphate deposition (CPPD) is a type of arthritis that causes pain, swelling, and inflammation in a joint. The knees are most often affected, but CPPD can also affect the wrists, elbows, shoulders, or ankles. CPPD is caused by the buildup of calcium crystals in a joint. The reason why this occurs is not known. Attacks of CPPD may come and go. The joint pain can be severe and may last for days to weeks. There is no way to remove the crystals from the joint and no cure for this condition. However, treatment can relieve symptoms and improve joint function. Rest the affected joint until your symptoms start to go away. After your symptoms go away, begin to exercise regularly or do physical therapy. This information is not intended to replace advice given to you by your health care provider. Make sure you discuss any questions you have with your health care provider. Document Revised: 06/19/2021 Document Reviewed: 06/19/2021 Elsevier Patient Education  2023 Elsevier Inc.     Edwina Barth, MD [facility]

## 2023-02-12 NOTE — Assessment & Plan Note (Signed)
As seen on recent wrist x-ray Recommend to start prednisone 40 mg daily for 5 days for treatment of acute inflammation.  Unknown trigger. Blood work done today including uric acid levels.  Doubt gout however.

## 2023-02-16 LAB — CBC WITH DIFFERENTIAL/PLATELET
Basophils Absolute: 0.1 10*3/uL (ref 0.0–0.1)
Basophils Relative: 0.5 % (ref 0.0–3.0)
Eosinophils Absolute: 0.1 10*3/uL (ref 0.0–0.7)
Eosinophils Relative: 0.4 % (ref 0.0–5.0)
HCT: 39.3 % (ref 39.0–52.0)
Hemoglobin: 13.6 g/dL (ref 13.0–17.0)
Lymphocytes Relative: 28.1 % (ref 12.0–46.0)
Lymphs Abs: 3.8 10*3/uL (ref 0.7–4.0)
MCHC: 34.7 g/dL (ref 30.0–36.0)
MCV: 93.5 fl (ref 78.0–100.0)
Monocytes Absolute: 1.2 10*3/uL — ABNORMAL HIGH (ref 0.1–1.0)
Monocytes Relative: 8.7 % (ref 3.0–12.0)
Neutro Abs: 8.4 10*3/uL — ABNORMAL HIGH (ref 1.4–7.7)
Neutrophils Relative %: 62.3 % (ref 43.0–77.0)
Platelets: 252 10*3/uL (ref 150.0–400.0)
RBC: 4.2 Mil/uL — ABNORMAL LOW (ref 4.22–5.81)
RDW: 14.8 % (ref 11.5–15.5)
WBC: 13.5 10*3/uL — ABNORMAL HIGH (ref 4.0–10.5)

## 2023-02-16 LAB — COMPREHENSIVE METABOLIC PANEL
ALT: 21 U/L (ref 0–53)
AST: 16 U/L (ref 0–37)
Albumin: 4.3 g/dL (ref 3.5–5.2)
Alkaline Phosphatase: 66 U/L (ref 39–117)
BUN: 15 mg/dL (ref 6–23)
CO2: 24 mEq/L (ref 19–32)
Calcium: 9.8 mg/dL (ref 8.4–10.5)
Chloride: 106 mEq/L (ref 96–112)
Creatinine, Ser: 1.1 mg/dL (ref 0.40–1.50)
GFR: 67.04 mL/min (ref >60.00)
Glucose, Bld: 94 mg/dL (ref 70–99)
Potassium: 3 mEq/L — ABNORMAL LOW (ref 3.5–5.1)
Sodium: 140 mEq/L (ref 135–145)
Total Bilirubin: 0.3 mg/dL (ref 0.2–1.2)
Total Protein: 7.6 g/dL (ref 6.0–8.3)

## 2023-02-16 LAB — URIC ACID: Uric Acid, Serum: 5.3 mg/dL (ref 4.0–7.8)

## 2023-02-21 ENCOUNTER — Other Ambulatory Visit: Payer: Self-pay | Admitting: Internal Medicine

## 2023-02-21 DIAGNOSIS — K2100 Gastro-esophageal reflux disease with esophagitis, without bleeding: Secondary | ICD-10-CM

## 2023-02-21 DIAGNOSIS — K2270 Barrett's esophagus without dysplasia: Secondary | ICD-10-CM

## 2023-03-04 DIAGNOSIS — H04123 Dry eye syndrome of bilateral lacrimal glands: Secondary | ICD-10-CM | POA: Diagnosis not present

## 2023-03-04 DIAGNOSIS — Z961 Presence of intraocular lens: Secondary | ICD-10-CM | POA: Diagnosis not present

## 2023-03-04 DIAGNOSIS — H21233 Degeneration of iris (pigmentary), bilateral: Secondary | ICD-10-CM | POA: Diagnosis not present

## 2023-03-05 ENCOUNTER — Ambulatory Visit (INDEPENDENT_AMBULATORY_CARE_PROVIDER_SITE_OTHER): Payer: 59

## 2023-03-05 DIAGNOSIS — E538 Deficiency of other specified B group vitamins: Secondary | ICD-10-CM | POA: Diagnosis not present

## 2023-03-05 MED ORDER — CYANOCOBALAMIN 1000 MCG/ML IJ SOLN
1000.0000 ug | Freq: Once | INTRAMUSCULAR | Status: AC
Start: 2023-03-05 — End: 2023-03-05
  Administered 2023-03-05: 1000 ug via INTRAMUSCULAR

## 2023-03-05 NOTE — Progress Notes (Signed)
Pt was given B12 injection with no complications. 

## 2023-03-16 ENCOUNTER — Other Ambulatory Visit: Payer: Self-pay | Admitting: Internal Medicine

## 2023-03-16 DIAGNOSIS — I10 Essential (primary) hypertension: Secondary | ICD-10-CM

## 2023-04-06 ENCOUNTER — Ambulatory Visit (INDEPENDENT_AMBULATORY_CARE_PROVIDER_SITE_OTHER): Payer: 59

## 2023-04-06 DIAGNOSIS — E538 Deficiency of other specified B group vitamins: Secondary | ICD-10-CM

## 2023-04-06 MED ORDER — CYANOCOBALAMIN 1000 MCG/ML IJ SOLN
1000.00 ug | Freq: Once | INTRAMUSCULAR | Status: AC
Start: 2023-04-06 — End: 2023-04-06
  Administered 2023-04-06: 1000 ug via INTRAMUSCULAR

## 2023-04-06 NOTE — Progress Notes (Signed)
Pt here for monthly B12 injection per   B12 1000mcg given IM and pt tolerated injection well.    

## 2023-04-08 ENCOUNTER — Other Ambulatory Visit (HOSPITAL_BASED_OUTPATIENT_CLINIC_OR_DEPARTMENT_OTHER): Payer: Self-pay | Admitting: Family

## 2023-04-08 DIAGNOSIS — I10 Essential (primary) hypertension: Secondary | ICD-10-CM

## 2023-04-08 NOTE — Telephone Encounter (Signed)
Rx(s) sent to pharmacy electronically.  

## 2023-04-14 ENCOUNTER — Other Ambulatory Visit: Payer: Self-pay | Admitting: Internal Medicine

## 2023-04-14 ENCOUNTER — Telehealth: Payer: Self-pay | Admitting: Internal Medicine

## 2023-04-14 DIAGNOSIS — J418 Mixed simple and mucopurulent chronic bronchitis: Secondary | ICD-10-CM

## 2023-04-14 DIAGNOSIS — J45990 Exercise induced bronchospasm: Secondary | ICD-10-CM

## 2023-04-14 DIAGNOSIS — B009 Herpesviral infection, unspecified: Secondary | ICD-10-CM

## 2023-04-14 MED ORDER — BREZTRI AEROSPHERE 160-9-4.8 MCG/ACT IN AERO: 2.0000 | INHALATION_SPRAY | Freq: Two times a day (BID) | RESPIRATORY_TRACT | 5 refills | Status: DC

## 2023-04-14 NOTE — Telephone Encounter (Signed)
Prescription Request  04/14/2023  LOV: 01/01/2023  What is the name of the medication or equipment? Bretzi inhaler  Have you contacted your pharmacy to request a refill? Yes   Which pharmacy would you like this sent to?   [id number] [place], [place] - [phone] AT [facility] [address] [place] [address] Phone: [phone] Fax: [phone]     Patient notified that their request is being sent to the clinical staff for review and that they should receive a response within 2 business days.   Please advise at Mobile [phone] (mobile)

## 2023-05-07 ENCOUNTER — Ambulatory Visit: Payer: 59

## 2023-05-08 ENCOUNTER — Other Ambulatory Visit: Payer: Self-pay | Admitting: Internal Medicine

## 2023-05-08 DIAGNOSIS — F319 Bipolar disorder, unspecified: Secondary | ICD-10-CM

## 2023-05-08 DIAGNOSIS — M792 Neuralgia and neuritis, unspecified: Secondary | ICD-10-CM

## 2023-05-11 ENCOUNTER — Ambulatory Visit (INDEPENDENT_AMBULATORY_CARE_PROVIDER_SITE_OTHER): Payer: 59 | Admitting: Podiatry

## 2023-05-11 ENCOUNTER — Encounter: Payer: Self-pay | Admitting: Podiatry

## 2023-05-11 DIAGNOSIS — B351 Tinea unguium: Secondary | ICD-10-CM | POA: Diagnosis not present

## 2023-05-11 DIAGNOSIS — M79674 Pain in right toe(s): Secondary | ICD-10-CM

## 2023-05-11 DIAGNOSIS — N1832 Chronic kidney disease, stage 3b: Secondary | ICD-10-CM

## 2023-05-11 DIAGNOSIS — M79675 Pain in left toe(s): Secondary | ICD-10-CM

## 2023-05-11 NOTE — Progress Notes (Signed)
This patient returns to my office for at risk foot care.  This patient requires this care by a professional since this patient will be at risk due to having chronic kidney disease  This patient is unable to cut nails himself since the patient cannot reach his nails.These nails are painful walking and wearing shoes.  This patient presents for at risk foot care today.  General Appearance  Alert, conversant and in no acute stress.  Vascular  Dorsalis pedis and posterior tibial  pulses are weakly  palpable  bilaterally.  Capillary return is within normal limits  bilaterally. Temperature is within normal limits  bilaterally.  Neurologic  Senn-Weinstein monofilament wire test within normal limits  bilaterally. Muscle power within normal limits bilaterally.  Nails Thick disfigured discolored nails with subungual debris  from hallux to fifth toes bilaterally. No evidence of bacterial infection or drainage bilaterally.  Orthopedic  No limitations of motion  feet .  No crepitus or effusions noted.  No bony pathology or digital deformities noted.  Skin  normotropic skin with no porokeratosis noted bilaterally.  No signs of infections or ulcers noted.     Onychomycosis  Pain in right toes  Pain in left toes  Consent was obtained for treatment procedures.   Mechanical debridement of nails 1-5  bilaterally performed with a nail nipper.  Filed with dremel without incident.    Return office visit   12  weeks                  Told patient to return for periodic foot care and evaluation due to potential at risk complications.   Helane Gunther DPM

## 2023-05-13 ENCOUNTER — Ambulatory Visit (INDEPENDENT_AMBULATORY_CARE_PROVIDER_SITE_OTHER): Payer: 59

## 2023-05-13 DIAGNOSIS — E538 Deficiency of other specified B group vitamins: Secondary | ICD-10-CM | POA: Diagnosis not present

## 2023-05-13 MED ORDER — CYANOCOBALAMIN 1000 MCG/ML IJ SOLN
1000.0000 ug | Freq: Once | INTRAMUSCULAR | Status: AC
Start: 2023-05-13 — End: 2023-05-13
  Administered 2023-05-13: 1000 ug via INTRAMUSCULAR

## 2023-05-13 NOTE — Progress Notes (Signed)
After obtaining consent, and per orders of Dr. Yetta Barre, injection of B12 given by Ferdie Ping. Patient instructed to report any adverse reaction to the clinic immediately.

## 2023-05-17 ENCOUNTER — Other Ambulatory Visit: Payer: Self-pay | Admitting: Internal Medicine

## 2023-05-17 DIAGNOSIS — M792 Neuralgia and neuritis, unspecified: Secondary | ICD-10-CM

## 2023-05-17 DIAGNOSIS — M19011 Primary osteoarthritis, right shoulder: Secondary | ICD-10-CM

## 2023-05-19 ENCOUNTER — Ambulatory Visit: Payer: 59 | Admitting: Family Medicine

## 2023-05-22 ENCOUNTER — Ambulatory Visit (INDEPENDENT_AMBULATORY_CARE_PROVIDER_SITE_OTHER): Payer: 59 | Admitting: Family Medicine

## 2023-05-22 ENCOUNTER — Encounter: Payer: Self-pay | Admitting: Family Medicine

## 2023-05-22 VITALS — BP 124/78 | HR 70 | Temp 97.7°F | Ht 66.0 in | Wt 174.0 lb

## 2023-05-22 DIAGNOSIS — F17200 Nicotine dependence, unspecified, uncomplicated: Secondary | ICD-10-CM

## 2023-05-22 DIAGNOSIS — R058 Other specified cough: Secondary | ICD-10-CM

## 2023-05-22 MED ORDER — DOXYCYCLINE HYCLATE 100 MG PO CAPS: 100.0000 mg | ORAL_CAPSULE | Freq: Two times a day (BID) | ORAL | 0 refills | Status: DC

## 2023-05-22 NOTE — Patient Instructions (Signed)
Take the antibiotic as prescribed with food.   Drink plenty of water.   Try over the counter Mucinex.

## 2023-05-22 NOTE — Progress Notes (Signed)
Subjective:  Jaime Barnes is a 72 y.o. male who presents for a 2 wk hx of cough, now productive of thick yellow sputum. Hx of bronchitis. Smoker.  Denies fever, chills, dizziness, chest pain, palpitations, shortness of breath, abdominal pain, N/V/D, LE edema.    ROS as in subjective.   Objective: Vitals:   05/22/23 1508  BP: 124/78  Pulse: 70  Temp: 97.7 F (36.5 C)  SpO2: 98%    General appearance: Alert, WD/WN, no distress, mildly ill appearing                             Skin: warm, no rash                           Head: no sinus tenderness                            Eyes: conjunctiva normal, corneas clear, PERRLA                            Ears: pearly TMs, external ear canals normal                          Nose: septum midline, turbinates swollen, with erythema and clear discharge             Mouth/throat: MMM, tongue normal, mild pharyngeal erythema                           Neck: supple, no adenopathy, no thyromegaly, nontender                          Heart: RRR                         Lungs: CTA bilaterally, no wheezes, rales, or rhonchi      Assessment: Productive cough - Plan: doxycycline (VIBRAMYCIN) 100 MG capsule  TOBACCO USER   Plan: Doxycycline prescribed. Suggested symptomatic OTC remedies. Nasal saline spray for congestion.  Tylenol or Ibuprofen OTC for fever and malaise.  Call/return if worsening or not back to baseline after completing the antibiotic.

## 2023-06-05 ENCOUNTER — Other Ambulatory Visit (HOSPITAL_BASED_OUTPATIENT_CLINIC_OR_DEPARTMENT_OTHER): Payer: Self-pay | Admitting: Cardiovascular Disease

## 2023-06-08 ENCOUNTER — Ambulatory Visit: Payer: 59 | Admitting: Family Medicine

## 2023-06-12 ENCOUNTER — Other Ambulatory Visit: Payer: Self-pay | Admitting: Gastroenterology

## 2023-06-12 DIAGNOSIS — K7030 Alcoholic cirrhosis of liver without ascites: Secondary | ICD-10-CM

## 2023-06-15 ENCOUNTER — Ambulatory Visit (INDEPENDENT_AMBULATORY_CARE_PROVIDER_SITE_OTHER): Payer: 59

## 2023-06-15 ENCOUNTER — Other Ambulatory Visit: Payer: Self-pay

## 2023-06-15 DIAGNOSIS — E538 Deficiency of other specified B group vitamins: Secondary | ICD-10-CM

## 2023-06-15 MED ORDER — CYANOCOBALAMIN 1000 MCG/ML IJ SOLN
1000.0000 ug | Freq: Once | INTRAMUSCULAR | Status: AC
Start: 2023-06-15 — End: 2023-06-15
  Administered 2023-06-15: 1000 ug via INTRAMUSCULAR

## 2023-06-15 NOTE — Progress Notes (Signed)
Pt here for monthly B12 injection per Dr. Yetta Barre  B12 given IM and pt tolerated injection well.  Next B12 injection scheduled for 10/16   Patient was advised to report to the office immediately if he noticed any adverse reaction.

## 2023-06-16 ENCOUNTER — Other Ambulatory Visit (HOSPITAL_BASED_OUTPATIENT_CLINIC_OR_DEPARTMENT_OTHER): Payer: Self-pay | Admitting: Family

## 2023-06-16 DIAGNOSIS — I25118 Atherosclerotic heart disease of native coronary artery with other forms of angina pectoris: Secondary | ICD-10-CM

## 2023-06-16 DIAGNOSIS — I10 Essential (primary) hypertension: Secondary | ICD-10-CM

## 2023-06-20 ENCOUNTER — Other Ambulatory Visit (HOSPITAL_BASED_OUTPATIENT_CLINIC_OR_DEPARTMENT_OTHER): Payer: Self-pay | Admitting: Family

## 2023-06-20 DIAGNOSIS — I25118 Atherosclerotic heart disease of native coronary artery with other forms of angina pectoris: Secondary | ICD-10-CM

## 2023-06-23 ENCOUNTER — Ambulatory Visit
Admission: RE | Admit: 2023-06-23 | Discharge: 2023-06-23 | Disposition: A | Discharge status: Home / Self Care | Payer: 59 | Source: Ambulatory Visit | Attending: Gastroenterology | Admitting: Gastroenterology

## 2023-06-23 DIAGNOSIS — K7460 Unspecified cirrhosis of liver: Secondary | ICD-10-CM | POA: Diagnosis not present

## 2023-06-23 DIAGNOSIS — N281 Cyst of kidney, acquired: Secondary | ICD-10-CM | POA: Diagnosis not present

## 2023-06-23 DIAGNOSIS — K7030 Alcoholic cirrhosis of liver without ascites: Secondary | ICD-10-CM

## 2023-07-06 ENCOUNTER — Ambulatory Visit: Payer: 59 | Admitting: Internal Medicine

## 2023-07-06 ENCOUNTER — Telehealth: Payer: Self-pay

## 2023-07-06 ENCOUNTER — Other Ambulatory Visit (HOSPITAL_BASED_OUTPATIENT_CLINIC_OR_DEPARTMENT_OTHER): Payer: Self-pay | Admitting: Family

## 2023-07-06 ENCOUNTER — Encounter: Payer: Self-pay | Admitting: Internal Medicine

## 2023-07-06 ENCOUNTER — Ambulatory Visit (INDEPENDENT_AMBULATORY_CARE_PROVIDER_SITE_OTHER): Payer: 59

## 2023-07-06 VITALS — BP 126/74 | HR 63 | Temp 97.7°F | Resp 16 | Ht 66.0 in | Wt 175.0 lb

## 2023-07-06 DIAGNOSIS — M5442 Lumbago with sciatica, left side: Secondary | ICD-10-CM

## 2023-07-06 DIAGNOSIS — N1832 Chronic kidney disease, stage 3b: Secondary | ICD-10-CM

## 2023-07-06 DIAGNOSIS — T502X5A Adverse effect of carbonic-anhydrase inhibitors, benzothiadiazides and other diuretics, initial encounter: Secondary | ICD-10-CM | POA: Diagnosis not present

## 2023-07-06 DIAGNOSIS — Z1211 Encounter for screening for malignant neoplasm of colon: Secondary | ICD-10-CM

## 2023-07-06 DIAGNOSIS — E876 Hypokalemia: Secondary | ICD-10-CM

## 2023-07-06 DIAGNOSIS — D510 Vitamin B12 deficiency anemia due to intrinsic factor deficiency: Secondary | ICD-10-CM

## 2023-07-06 DIAGNOSIS — I10 Essential (primary) hypertension: Secondary | ICD-10-CM

## 2023-07-06 DIAGNOSIS — M419 Scoliosis, unspecified: Secondary | ICD-10-CM | POA: Diagnosis not present

## 2023-07-06 DIAGNOSIS — Z23 Encounter for immunization: Secondary | ICD-10-CM | POA: Diagnosis not present

## 2023-07-06 DIAGNOSIS — M5136 Other intervertebral disc degeneration, lumbar region with discogenic back pain only: Secondary | ICD-10-CM | POA: Diagnosis not present

## 2023-07-06 DIAGNOSIS — M47816 Spondylosis without myelopathy or radiculopathy, lumbar region: Secondary | ICD-10-CM | POA: Diagnosis not present

## 2023-07-06 DIAGNOSIS — I700 Atherosclerosis of aorta: Secondary | ICD-10-CM | POA: Diagnosis not present

## 2023-07-06 LAB — BASIC METABOLIC PANEL
BUN: 13 mg/dL (ref 6–23)
CO2: 24 meq/L (ref 19–32)
Calcium: 9.6 mg/dL (ref 8.4–10.5)
Chloride: 107 meq/L (ref 96–112)
Creatinine, Ser: 1.27 mg/dL (ref 0.40–1.50)
GFR: 56.27 mL/min — ABNORMAL LOW (ref >60.00)
Glucose, Bld: 93 mg/dL (ref 70–99)
Potassium: 3.8 meq/L (ref 3.5–5.1)
Sodium: 140 meq/L (ref 135–145)

## 2023-07-06 LAB — CBC WITH DIFFERENTIAL/PLATELET
Basophils Absolute: 0.1 10*3/uL (ref 0.0–0.1)
Basophils Relative: 0.7 % (ref 0.0–3.0)
Eosinophils Absolute: 0.1 10*3/uL (ref 0.0–0.7)
Eosinophils Relative: 1.4 % (ref 0.0–5.0)
HCT: 42.5 % (ref 39.0–52.0)
Hemoglobin: 14.3 g/dL (ref 13.0–17.0)
Lymphocytes Relative: 26.6 % (ref 12.0–46.0)
Lymphs Abs: 2.6 10*3/uL (ref 0.7–4.0)
MCHC: 33.6 g/dL (ref 30.0–36.0)
MCV: 97.1 fL (ref 78.0–100.0)
Monocytes Absolute: 0.9 10*3/uL (ref 0.1–1.0)
Monocytes Relative: 9.1 % (ref 3.0–12.0)
Neutro Abs: 6.1 10*3/uL (ref 1.4–7.7)
Neutrophils Relative %: 62.2 % (ref 43.0–77.0)
Platelets: 206 10*3/uL (ref 150.0–400.0)
RBC: 4.37 Mil/uL (ref 4.22–5.81)
RDW: 14.6 % (ref 11.5–15.5)
WBC: 9.8 10*3/uL (ref 4.0–10.5)

## 2023-07-06 LAB — FOLATE: Folate: 9.5 ng/mL (ref >5.9)

## 2023-07-06 LAB — MAGNESIUM: Magnesium: 2.1 mg/dL (ref 1.5–2.5)

## 2023-07-06 MED ORDER — CYANOCOBALAMIN 1000 MCG/ML IJ SOLN
1000.0000 ug | Freq: Once | INTRAMUSCULAR | Status: AC
Start: 2023-07-06 — End: 2023-07-06
  Administered 2023-07-06: 1000 ug via INTRAMUSCULAR

## 2023-07-06 NOTE — Progress Notes (Unsigned)
Subjective:  Patient ID: Jaime Barnes, male    DOB: 12/09/1950  Age: 72 y.o. MRN: [id number]  CC: Anemia, Hypertension, and Back Pain   HPI Jaime Barnes presents for f/up ----  Outpatient Medications Prior to Visit  Medication Sig Dispense Refill   albuterol (VENTOLIN HFA) 108 (90 Base) MCG/ACT inhaler INHALE 1 TO 2 PUFFS INTO THE LUNGS EVERY 6 HOURS AS NEEDED FOR WHEEZING OR SHORTNESS OF BREATH 18 g 5   amLODipine (NORVASC) 10 MG tablet TAKE 1 TABLET(10 MG) BY MOUTH DAILY 90 tablet 3   aspirin 81 MG chewable tablet Chew 81 mg by mouth daily.     Budeson-Glycopyrrol-Formoterol (BREZTRI AEROSPHERE) 160-9-4.8 MCG/ACT AERO Inhale 2 puffs into the lungs 2 (two) times daily. 10.7 g 5   doxycycline (VIBRAMYCIN) 100 MG capsule Take 1 capsule (100 mg total) by mouth 2 (two) times daily. 20 capsule 0   Ferric Maltol (ACCRUFER) 30 MG CAPS Take 1 capsule by mouth in the morning and at bedtime. 180 capsule 1   gabapentin (NEURONTIN) 300 MG capsule TAKE 1 CAPSULE(300 MG) BY MOUTH TWICE DAILY 180 capsule 1   icosapent Ethyl (VASCEPA) 1 g capsule TAKE 2 CAPSULES(2 GRAMS) BY MOUTH TWICE DAILY 360 capsule 0   magnesium oxide (MAG-OX) 400 (240 Mg) MG tablet TAKE 1 TABLET(400 MG) BY MOUTH DAILY 90 tablet 1   omeprazole (PRILOSEC) 20 MG capsule TAKE 1 CAPSULE(20 MG) BY MOUTH TWICE DAILY BEFORE A MEAL 180 capsule 0   polyethylene glycol (MIRALAX / GLYCOLAX) 17 g packet Take 17 g by mouth daily. 14 each 0   Rimegepant Sulfate (NURTEC) 75 MG TBDP Take 1 tablet (75 mg total) by mouth as needed (take 1 tab as needed at the earlist on set of a Migraine. Max 1 tab in 24 hours). 8 tablet 5   risperiDONE (RISPERDAL) 1 MG tablet TAKE 1 TABLET(1 MG) BY MOUTH AT BEDTIME 90 tablet 0   thiamine (VITAMIN B1) 100 MG tablet Take 1 tablet (100 mg total) by mouth daily. 90 tablet 1   topiramate (TOPAMAX) 100 MG tablet TAKE 1 TABLET(100 MG) BY MOUTH TWICE DAILY 60 tablet 11   traMADol (ULTRAM) 50 MG tablet Take 1 tablet (50 mg  total) by mouth every 12 (twelve) hours as needed. 60 tablet 1   traZODone (DESYREL) 100 MG tablet Take 2 tablets (200 mg total) by mouth at bedtime. 180 tablet 1   Ubrogepant (UBRELVY) 100 MG TABS TAKE 1 TABLET(100 MG) BY MOUTH 1 TIME EVERY 2 HOURS AS DIRECTED AS NEEDED. MAXIMUM 2 TABLETS IN 24 HOURS 16 tablet 11   valACYclovir (VALTREX) 500 MG tablet TAKE 1 TABLET(500 MG) BY MOUTH DAILY 90 tablet 1   hydrochlorothiazide (MICROZIDE) 12.5 MG capsule TAKE 1 CAPSULE(12.5 MG) BY MOUTH DAILY 90 capsule 3   rosuvastatin (CRESTOR) 20 MG tablet Take 1 tablet (20 mg total) by mouth daily. 90 tablet 3   No facility-administered medications prior to visit.    ROS Review of Systems  Psychiatric/Behavioral:  Positive for confusion and decreased concentration. Negative for dysphoric mood.     Objective:  BP 126/74 (BP Location: Left Arm, Patient Position: Sitting, Cuff Size: Large)   Pulse 63   Temp 97.7 F (36.5 C) (Oral)   Resp 16   Ht 5\' 6"  (1.676 m)   Wt 175 lb (79.4 kg)   SpO2 92%   BMI 28.25 kg/m   BP Readings from Last 3 Encounters:  07/06/23 126/74  05/22/23 124/78  02/12/23 126/74  Wt Readings from Last 3 Encounters:  07/06/23 175 lb (79.4 kg)  05/22/23 174 lb (78.9 kg)  02/12/23 174 lb 8 oz (79.2 kg)    Physical Exam  Lab Results  Component Value Date   WBC 9.8 07/06/2023   HGB 14.3 07/06/2023   HCT 42.5 07/06/2023   PLT 206.0 07/06/2023   GLUCOSE 93 07/06/2023   CHOL 112 01/01/2023   TRIG 159.0 (H) 01/01/2023   HDL 36.10 (L) 01/01/2023   LDLDIRECT 156 (H) 06/09/2011   LDLCALC 44 01/01/2023   ALT 21 02/16/2023   AST 16 02/16/2023   NA 140 07/06/2023   K 3.8 07/06/2023   CL 107 07/06/2023   CREATININE 1.27 07/06/2023   BUN 13 07/06/2023   CO2 24 07/06/2023   TSH 3.80 01/01/2023   PSA 1.08 01/01/2023   INR 1.0 01/01/2023   HGBA1C 5.8 (H) 12/03/2020   DG Lumbar Spine Complete  Result Date: 07/06/2023 CLINICAL DATA:  72 year old male with history of  chronic low back pain. EXAM: LUMBAR SPINE - COMPLETE 4+ VIEW COMPARISON:  Lumbar spine radiograph 12/08/2014. FINDINGS: Multiple views of the lumbar spine demonstrate no acute displaced fracture or compression type fracture. Mild levoscoliosis of the mid lumbar spine. Alignment is otherwise anatomic. Mild multilevel degenerative disc disease and facet arthropathy is noted. Extensive atherosclerotic calcifications are noted in the abdominal aorta and pelvic vasculature. IMPRESSION: 1. No acute radiographic abnormality of the lumbar spine to account for the patient's symptoms. 2. Mild levoscoliosis with mild multilevel degenerative disc disease and lumbar spondylosis. 3. Extensive atherosclerosis. Electronically Signed   By: Trudie Reed M.D.   On: 07/06/2023 10:43     Assessment & Plan:  Primary hypertension -     Basic metabolic panel; Future -     AMB Referral to Pharmacy Medication Management  Stage 3b chronic kidney disease (HCC) -     Basic metabolic panel; Future -     AMB Referral to Pharmacy Medication Management  Vitamin B12 deficiency anemia due to intrinsic factor deficiency -     CBC with Differential/Platelet; Future -     Folate; Future -     Cyanocobalamin  Flu vaccine need -     Flu Vaccine Trivalent High Dose (Fluad)  Diuretic-induced hypokalemia -     Magnesium; Future  Acute left-sided low back pain with left-sided sciatica -     DG Lumbar Spine Complete; Future  Screening for colon cancer -     Ambulatory referral to Gastroenterology     Follow-up: Return in about 3 months (around 10/06/2023).  Sanda Linger, MD

## 2023-07-06 NOTE — Progress Notes (Signed)
Care Guide Note  07/06/2023 Name: Jaime Barnes MRN: [id number] DOB: 10-22-1950  Referred by: Etta Grandchild, MD Reason for referral : Care Coordination (Outreach to schedule with Pharm d )   Jaime Barnes is a 72 y.o. year old male who is a primary care patient of Etta Grandchild, MD. Jaime Barnes was referred to the pharmacist for assistance related to HTN.    Successful contact was made with the patient to discuss pharmacy services including being ready for the pharmacist to call at least 5 minutes before the scheduled appointment time, to have medication bottles and any blood sugar or blood pressure readings ready for review. The patient agreed to meet with the pharmacist via with the pharmacist via telephone visit on (date/time).  07/08/2023  Penne Lash, RMA Care Guide [facility]  [place], [place] [address] Direct Dial: [phone] [email or web address]

## 2023-07-06 NOTE — Patient Instructions (Addendum)
PLEASE STOP TAKING HYDROCHLOROTHIAZIDE   Acute Back Pain, Adult Acute back pain is sudden and usually short-lived. It is often caused by an injury to the muscles and tissues in the back. The injury may result from: A muscle, tendon, or ligament getting overstretched or torn. Ligaments are tissues that connect bones to each other. Lifting something improperly can cause a back strain. Wear and tear (degeneration) of the spinal disks. Spinal disks are circular tissue that provide cushioning between the bones of the spine (vertebrae). Twisting motions, such as while playing sports or doing yard work. A hit to the back. Arthritis. You may have a physical exam, lab tests, and imaging tests to find the cause of your pain. Acute back pain usually goes away with rest and home care. Follow these instructions at home: Managing pain, stiffness, and swelling Take over-the-counter and prescription medicines only as told by your health care provider. Treatment may include medicines for pain and inflammation that are taken by mouth or applied to the skin, or muscle relaxants. Your health care provider may recommend applying ice during the first 24-48 hours after your pain starts. To do this: Put ice in a plastic bag. Place a towel between your skin and the bag. Leave the ice on for 20 minutes, 2-3 times a day. Remove the ice if your skin turns bright red. This is very important. If you cannot feel pain, heat, or cold, you have a greater risk of damage to the area. If directed, apply heat to the affected area as often as told by your health care provider. Use the heat source that your health care provider recommends, such as a moist heat pack or a heating pad. Place a towel between your skin and the heat source. Leave the heat on for 20-30 minutes. Remove the heat if your skin turns bright red. This is especially important if you are unable to feel pain, heat, or cold. You have a greater risk of getting  burned. Activity  Do not stay in bed. Staying in bed for more than 1-2 days can delay your recovery. Sit up and stand up straight. Avoid leaning forward when you sit or hunching over when you stand. If you work at a desk, sit close to it so you do not need to lean over. Keep your chin tucked in. Keep your neck drawn back, and keep your elbows bent at a 90-degree angle (right angle). Sit high and close to the steering wheel when you drive. Add lower back (lumbar) support to your car seat, if needed. Take short walks on even surfaces as soon as you are able. Try to increase the length of time you walk each day. Do not sit, drive, or stand in one place for more than 30 minutes at a time. Sitting or standing for long periods of time can put stress on your back. Do not drive or use heavy machinery while taking prescription pain medicine. Use proper lifting techniques. When you bend and lift, use positions that put less stress on your back: [place] your knees. Keep the load close to your body. Avoid twisting. Exercise regularly as told by your health care provider. Exercising helps your back heal faster and helps prevent back injuries by keeping muscles strong and flexible. Work with a physical therapist to make a safe exercise program, as recommended by your health care provider. Do any exercises as told by your physical therapist. Lifestyle Maintain a healthy weight. Extra weight puts stress on your back and  makes it difficult to have good posture. Avoid activities or situations that make you feel anxious or stressed. Stress and anxiety increase muscle tension and can make back pain worse. Learn ways to manage anxiety and stress, such as through exercise. General instructions Sleep on a firm mattress in a comfortable position. Try lying on your side with your knees slightly bent. If you lie on your back, put a pillow under your knees. Keep your head and neck in a straight line with your spine (neutral  position) when using electronic equipment like smartphones or pads. To do this: Raise your smartphone or pad to look at it instead of bending your head or neck to look down. Put the smartphone or pad at the level of your face while looking at the screen. Follow your treatment plan as told by your health care provider. This may include: Cognitive or behavioral therapy. Acupuncture or massage therapy. Meditation or yoga. Contact a health care provider if: You have pain that is not relieved with rest or medicine. You have increasing pain going down into your legs or buttocks. Your pain does not improve after 2 weeks. You have pain at night. You lose weight without trying. You have a fever or chills. You develop nausea or vomiting. You develop abdominal pain. Get help right away if: You develop new bowel or bladder control problems. You have unusual weakness or numbness in your arms or legs. You feel faint. These symptoms may represent a serious problem that is an emergency. Do not wait to see if the symptoms will go away. Get medical help right away. Call your local emergency services (911 in the [place] Do not drive yourself to the hospital. Summary Acute back pain is sudden and usually short-lived. Use proper lifting techniques. When you bend and lift, use positions that put less stress on your back. Take over-the-counter and prescription medicines only as told by your health care provider, and apply heat or ice as told. This information is not intended to replace advice given to you by your health care provider. Make sure you discuss any questions you have with your health care provider. Document Revised: 12/07/2020 Document Reviewed: 12/07/2020 Elsevier Patient Education  2024 [employer].

## 2023-07-08 ENCOUNTER — Other Ambulatory Visit: Payer: 59

## 2023-07-08 NOTE — Progress Notes (Signed)
07/08/2023 Name: Jaime Barnes MRN: [id number] DOB: [birth year]  No chief complaint on file.   Jaime Barnes is a 72 y.o. year old male who presented for a telephone visit.   They were referred to the pharmacist by their PCP for assistance in managing hypertension.   No ankle swelling.   Subjective:  Care Team: Primary Care Provider: Etta Grandchild, MD ; Next Scheduled Visit: none scheduled   Medication Access/Adherence  Current Pharmacy:  [facility] #[id number] Ginette Otto, [place] - [address] AT [facility] OF [facility] & [address] [place] [place] [address] Phone: [phone] Fax: [phone]   Patient reports affordability concerns with their medications: No  Patient reports access/transportation concerns to their pharmacy: No  Patient reports adherence concerns with their medications:  No     Hypertension:  Current medications: amlodipine 10 mg daily. Confirmed pt stopped hydrochlorothiazide per PCP instructions after 10/7 appt  Denies ankle swelling since discontinuation of HCTZ  Patient has a validated, automated, upper arm home BP cuff Current blood pressure readings readings: none recent at home  BP Readings from Last 3 Encounters:  07/06/23 126/74  05/22/23 124/78  02/12/23 126/74     Patient denies hypotensive s/sx including dizziness, lightheadedness.    Current meal patterns: pt reports adding no salt to his food   Objective:  Lab Results  Component Value Date   HGBA1C 5.8 (H) 12/03/2020    Lab Results  Component Value Date   CREATININE 1.27 07/06/2023   BUN 13 07/06/2023   NA 140 07/06/2023   K 3.8 07/06/2023   CL 107 07/06/2023   CO2 24 07/06/2023    Lab Results  Component Value Date   CHOL 112 01/01/2023   HDL 36.10 (L) 01/01/2023   LDLCALC 44 01/01/2023   LDLDIRECT 156 (H) 06/09/2011   TRIG 159.0 (H) 01/01/2023   CHOLHDL 3 01/01/2023    Medications Reviewed Today   Medications were not  reviewed in this encounter       Assessment/Plan:   Hypertension: - Currently controlled, BP goal <130/80 - Reviewed appropriate blood pressure monitoring technique and reviewed goal blood pressure. Recommended to check home blood pressure and heart rate daily x2 weeks and keep log.   Follow Up Plan: 10/23 at 12:30 for BP readings  Arbutus Leas, PharmD, BCPS [facility] [phone]

## 2023-07-09 ENCOUNTER — Encounter: Payer: Self-pay | Admitting: Internal Medicine

## 2023-07-09 MED ORDER — SHINGRIX 50 MCG/0.5ML IM SUSR: 0.5000 mL | Freq: Once | INTRAMUSCULAR | 1 refills | Status: AC

## 2023-07-15 ENCOUNTER — Ambulatory Visit: Payer: 59

## 2023-07-16 ENCOUNTER — Other Ambulatory Visit: Payer: Self-pay | Admitting: Internal Medicine

## 2023-07-16 ENCOUNTER — Telehealth: Payer: Self-pay | Admitting: Internal Medicine

## 2023-07-16 DIAGNOSIS — K2270 Barrett's esophagus without dysplasia: Secondary | ICD-10-CM

## 2023-07-16 DIAGNOSIS — G43009 Migraine without aura, not intractable, without status migrainosus: Secondary | ICD-10-CM

## 2023-07-16 DIAGNOSIS — M19011 Primary osteoarthritis, right shoulder: Secondary | ICD-10-CM

## 2023-07-16 DIAGNOSIS — M792 Neuralgia and neuritis, unspecified: Secondary | ICD-10-CM

## 2023-07-16 DIAGNOSIS — K2100 Gastro-esophageal reflux disease with esophagitis, without bleeding: Secondary | ICD-10-CM

## 2023-07-16 DIAGNOSIS — K5900 Constipation, unspecified: Secondary | ICD-10-CM

## 2023-07-16 DIAGNOSIS — B009 Herpesviral infection, unspecified: Secondary | ICD-10-CM

## 2023-07-16 DIAGNOSIS — J418 Mixed simple and mucopurulent chronic bronchitis: Secondary | ICD-10-CM

## 2023-07-16 MED ORDER — MAGNESIUM OXIDE -MG SUPPLEMENT 400 (240 MG) MG PO TABS
400.0000 mg | ORAL_TABLET | Freq: Every day | ORAL | 0 refills | Status: AC
Start: 2023-07-16 — End: ?

## 2023-07-16 MED ORDER — OMEPRAZOLE 20 MG PO CPDR: 20.0000 mg | DELAYED_RELEASE_CAPSULE | Freq: Two times a day (BID) | ORAL | 0 refills | Status: DC

## 2023-07-16 MED ORDER — BREZTRI AEROSPHERE 160-9-4.8 MCG/ACT IN AERO
2.0000 | INHALATION_SPRAY | Freq: Two times a day (BID) | RESPIRATORY_TRACT | 5 refills | Status: DC
Start: 2023-07-16 — End: 2024-02-09

## 2023-07-16 MED ORDER — VALACYCLOVIR HCL 500 MG PO TABS: 500.0000 mg | ORAL_TABLET | Freq: Every day | ORAL | 0 refills | Status: DC

## 2023-07-16 MED ORDER — NURTEC 75 MG PO TBDP
75.0000 mg | ORAL_TABLET | ORAL | 5 refills | Status: DC | PRN
Start: 2023-07-16 — End: 2023-11-18

## 2023-07-16 MED ORDER — TRAMADOL HCL 50 MG PO TABS
50.0000 mg | ORAL_TABLET | Freq: Two times a day (BID) | ORAL | 0 refills | Status: DC | PRN
Start: 2023-07-16 — End: 2023-08-16

## 2023-07-16 NOTE — Telephone Encounter (Signed)
Prescription Request  07/16/2023  LOV: 07/06/2023  What is the name of the medication or equipment? Mag-0x, omeprazole, bretze, nurtec, tramadone, , valtrex  Have you contacted your pharmacy to request a refill? No   Which pharmacy would you like this sent to?   [facility] #[id number] Ginette Otto, [place] - [phone] AT [facility] OF [facility] & [address] [place] [address] [place] [address] Phone: [phone] Fax: [phone]   Patient notified that their request is being sent to the clinical staff for review and that they should receive a response within 2 business days.   Please advise at [facility] [phone]

## 2023-07-22 ENCOUNTER — Other Ambulatory Visit: Payer: 59 | Admitting: Pharmacist

## 2023-07-22 NOTE — Progress Notes (Signed)
07/22/2023 Name: Jaime Barnes MRN: [id number] DOB: [birth year]  Chief Complaint  Patient presents with   Hypertension   Medication Management    Jaime Barnes is a 72 y.o. year old male who presented for a telephone visit.   They were referred to the pharmacist by their PCP for assistance in managing hypertension.   Recent home BPs:    Subjective:  Care Team: Primary Care Provider: Etta Grandchild, MD ; Next Scheduled Visit: none scheduled  Medication Access/Adherence  Current Pharmacy:  [facility] #[id number] Ginette Otto, [place] - [address] AT [facility] OF [facility] & [address] [place] [address] [place] [address] Phone: [phone] Fax: [phone]   Patient reports affordability concerns with their medications: No  Patient reports access/transportation concerns to their pharmacy: No  Patient reports adherence concerns with their medications:  No     Hypertension: Current medications: amlodipine 10 mg daily.  -hydrochlorothiazide per PCP instructions after 10/7 appt  Denies ankle swelling since discontinuation of HCTZ  Patient has a validated, automated, upper arm home BP cuff Current blood pressure readings since 07/08/23: 119/67 (83), 116/66 (84), 132/75 (68), 149/66 (63), 116/70 (70), 138/72 (62), 147/70 (71), 124/64 (78), 135/72 (74), 126/66 (73), 110/62 (70)  BP Readings from Last 3 Encounters:  07/06/23 126/74  05/22/23 124/78  02/12/23 126/74     Patient denies hypotensive s/sx including dizziness, lightheadedness.    Current meal patterns: pt reports adding no salt to his food   Objective:  Lab Results  Component Value Date   HGBA1C 5.8 (H) 12/03/2020    Lab Results  Component Value Date   CREATININE 1.27 07/06/2023   BUN 13 07/06/2023   NA 140 07/06/2023   K 3.8 07/06/2023   CL 107 07/06/2023   CO2 24 07/06/2023    Lab Results  Component Value Date   CHOL 112 01/01/2023   HDL 36.10 (L) 01/01/2023   LDLCALC 44  01/01/2023   LDLDIRECT 156 (H) 06/09/2011   TRIG 159.0 (H) 01/01/2023   CHOLHDL 3 01/01/2023    Medications Reviewed Today   Medications were not reviewed in this encounter       Assessment/Plan:   Hypertension: - Currently controlled with average home BP 128/68, BP goal <130/80 - Continue amlodipine 10 mg daily - Reviewed appropriate blood pressure monitoring technique and reviewed goal blood pressure. Recommended to check home blood pressure and heart rate daily 1-2x per week and notify us if it is running above 140/90 often.   Follow Up Plan: 10/23/23  Arbutus Leas, PharmD, BCPS [facility] [phone]

## 2023-08-10 ENCOUNTER — Ambulatory Visit: Payer: 59

## 2023-08-10 ENCOUNTER — Encounter: Payer: Self-pay | Admitting: Podiatry

## 2023-08-10 ENCOUNTER — Ambulatory Visit (INDEPENDENT_AMBULATORY_CARE_PROVIDER_SITE_OTHER): Payer: 59 | Admitting: Podiatry

## 2023-08-10 DIAGNOSIS — N1832 Chronic kidney disease, stage 3b: Secondary | ICD-10-CM | POA: Diagnosis not present

## 2023-08-10 DIAGNOSIS — M79674 Pain in right toe(s): Secondary | ICD-10-CM | POA: Diagnosis not present

## 2023-08-10 DIAGNOSIS — M79675 Pain in left toe(s): Secondary | ICD-10-CM

## 2023-08-10 DIAGNOSIS — B351 Tinea unguium: Secondary | ICD-10-CM | POA: Diagnosis not present

## 2023-08-10 NOTE — Progress Notes (Signed)
This patient returns to my office for at risk foot care.  This patient requires this care by a professional since this patient will be at risk due to having chronic kidney disease  This patient is unable to cut nails himself since the patient cannot reach his nails.These nails are painful walking and wearing shoes.  This patient presents for at risk foot care today.  General Appearance  Alert, conversant and in no acute stress.  Vascular  Dorsalis pedis and posterior tibial  pulses are weakly  palpable  bilaterally.  Capillary return is within normal limits  bilaterally. Temperature is within normal limits  bilaterally.  Neurologic  Senn-Weinstein monofilament wire test within normal limits  bilaterally. Muscle power within normal limits bilaterally.  Nails Thick disfigured discolored nails with subungual debris  from hallux to fifth toes bilaterally. No evidence of bacterial infection or drainage bilaterally.  Orthopedic  No limitations of motion  feet .  No crepitus or effusions noted.  No bony pathology or digital deformities noted.  Skin  normotropic skin with no porokeratosis noted bilaterally.  No signs of infections or ulcers noted.     Onychomycosis  Pain in right toes  Pain in left toes  Consent was obtained for treatment procedures.   Mechanical debridement of nails 1-5  bilaterally performed with a nail nipper.  Filed with dremel without incident.    Return office visit   12  weeks                  Told patient to return for periodic foot care and evaluation due to potential at risk complications.   Helane Gunther DPM

## 2023-08-11 ENCOUNTER — Ambulatory Visit: Payer: 59

## 2023-08-12 ENCOUNTER — Ambulatory Visit (INDEPENDENT_AMBULATORY_CARE_PROVIDER_SITE_OTHER): Payer: 59

## 2023-08-12 DIAGNOSIS — E538 Deficiency of other specified B group vitamins: Secondary | ICD-10-CM

## 2023-08-12 MED ORDER — CYANOCOBALAMIN 1000 MCG/ML IJ SOLN
1000.0000 ug | Freq: Once | INTRAMUSCULAR | Status: AC
  Administered 2023-08-12: 1000 ug via INTRAMUSCULAR

## 2023-08-12 NOTE — Progress Notes (Signed)
After obtaining consent, and per orders of Dr. Ronnald Ramp, injection of B12 given by Marrian Salvage. Patient instructed to report any adverse reaction to me immediately.

## 2023-08-16 ENCOUNTER — Other Ambulatory Visit: Payer: Self-pay | Admitting: Internal Medicine

## 2023-08-16 DIAGNOSIS — M792 Neuralgia and neuritis, unspecified: Secondary | ICD-10-CM

## 2023-08-16 DIAGNOSIS — M19011 Primary osteoarthritis, right shoulder: Secondary | ICD-10-CM

## 2023-08-23 ENCOUNTER — Other Ambulatory Visit (HOSPITAL_BASED_OUTPATIENT_CLINIC_OR_DEPARTMENT_OTHER): Payer: Self-pay | Admitting: Family

## 2023-08-23 DIAGNOSIS — I25118 Atherosclerotic heart disease of native coronary artery with other forms of angina pectoris: Secondary | ICD-10-CM

## 2023-09-11 ENCOUNTER — Ambulatory Visit (INDEPENDENT_AMBULATORY_CARE_PROVIDER_SITE_OTHER): Payer: 59

## 2023-09-11 DIAGNOSIS — D510 Vitamin B12 deficiency anemia due to intrinsic factor deficiency: Secondary | ICD-10-CM | POA: Diagnosis not present

## 2023-09-11 MED ORDER — CYANOCOBALAMIN 1000 MCG/ML IJ SOLN
1000.0000 ug | Freq: Once | INTRAMUSCULAR | Status: AC
  Administered 2023-09-11: 1000 ug via INTRAMUSCULAR

## 2023-09-11 NOTE — Progress Notes (Signed)
Pt was given B12 inject w/o any complications.

## 2023-09-14 ENCOUNTER — Telehealth (HOSPITAL_BASED_OUTPATIENT_CLINIC_OR_DEPARTMENT_OTHER): Payer: Self-pay

## 2023-09-14 ENCOUNTER — Other Ambulatory Visit (HOSPITAL_BASED_OUTPATIENT_CLINIC_OR_DEPARTMENT_OTHER): Payer: Self-pay

## 2023-09-14 DIAGNOSIS — I25118 Atherosclerotic heart disease of native coronary artery with other forms of angina pectoris: Secondary | ICD-10-CM

## 2023-09-14 MED ORDER — ICOSAPENT ETHYL 1 G PO CAPS: 2.0000 g | ORAL_CAPSULE | Freq: Two times a day (BID) | ORAL | 0 refills | Status: DC

## 2023-09-14 NOTE — Progress Notes (Signed)
Rx refill sent to pharmacy; pt will need f/u. Overdue.

## 2023-09-14 NOTE — Telephone Encounter (Signed)
Pt overdue for f/u appt. Routed to scheduling team to schedule.

## 2023-09-22 ENCOUNTER — Other Ambulatory Visit: Payer: Self-pay | Admitting: Internal Medicine

## 2023-09-22 DIAGNOSIS — F319 Bipolar disorder, unspecified: Secondary | ICD-10-CM

## 2023-10-01 NOTE — Telephone Encounter (Signed)
 LVMTCB to schedule overdue f/u with Dr Duke Salvia.

## 2023-10-09 ENCOUNTER — Ambulatory Visit (INDEPENDENT_AMBULATORY_CARE_PROVIDER_SITE_OTHER): Payer: 59

## 2023-10-09 DIAGNOSIS — D510 Vitamin B12 deficiency anemia due to intrinsic factor deficiency: Secondary | ICD-10-CM | POA: Diagnosis not present

## 2023-10-09 MED ORDER — CYANOCOBALAMIN 1000 MCG/ML IJ SOLN
1000.0000 ug | Freq: Once | INTRAMUSCULAR | Status: AC
  Administered 2023-10-09: 1000 ug via INTRAMUSCULAR

## 2023-10-09 NOTE — Progress Notes (Signed)
 Pt here for monthly B12 injection per   B12 given IM and pt tolerated injection well.  Patient responded well.

## 2023-10-15 ENCOUNTER — Other Ambulatory Visit: Payer: Self-pay | Admitting: Internal Medicine

## 2023-10-15 DIAGNOSIS — B009 Herpesviral infection, unspecified: Secondary | ICD-10-CM

## 2023-10-22 ENCOUNTER — Other Ambulatory Visit: Payer: Self-pay | Admitting: Pharmacist

## 2023-10-22 NOTE — Progress Notes (Signed)
   10/22/2023 Name: Jaime Barnes MRN: [id number] DOB: [birth year]  Chief Complaint  Patient presents with   Hypertension   Medication Management    Jaime Barnes is a 73 y.o. year old male who presented for a telephone visit.   They were referred to the pharmacist by their PCP for assistance in managing hypertension.    Subjective:  Care Team: Primary Care Provider: Etta Grandchild, MD ; Next Scheduled Visit: none scheduled  Medication Access/Adherence  Current Pharmacy:  [facility] #[id number] Ginette Otto, [place] - [address] AT [facility] OF [facility] & [address] [place] [address] [place] [address] Phone: [phone] Fax: [phone]   Patient reports affordability concerns with their medications: No  Patient reports access/transportation concerns to their pharmacy: No  Patient reports adherence concerns with their medications:  No     Hypertension: Current medications: amlodipine 10 mg daily.  -hydrochlorothiazide d/c per PCP instructions after 10/7 appt   Patient has a validated, automated, upper arm home BP cuff Current blood pressure readings: Pt notes he has been checking but is unable to find his written log during call  BP Readings from Last 3 Encounters:  07/06/23 126/74  05/22/23 124/78  02/12/23 126/74     Patient denies hypotensive s/sx including dizziness, lightheadedness.    Current meal patterns: pt reports adding no salt to his food   Objective:  Lab Results  Component Value Date   HGBA1C 5.8 (H) 12/03/2020    Lab Results  Component Value Date   CREATININE 1.27 07/06/2023   BUN 13 07/06/2023   NA 140 07/06/2023   K 3.8 07/06/2023   CL 107 07/06/2023   CO2 24 07/06/2023    Lab Results  Component Value Date   CHOL 112 01/01/2023   HDL 36.10 (L) 01/01/2023   LDLCALC 44 01/01/2023   LDLDIRECT 156 (H) 06/09/2011   TRIG 159.0 (H) 01/01/2023   CHOLHDL 3 01/01/2023    Medications Reviewed Today   Medications  were not reviewed in this encounter       Assessment/Plan:   Hypertension: - Currently controlled with average home BP previously 128/68, BP goal <130/80 - Continue amlodipine 10 mg daily - Reviewed appropriate blood pressure monitoring technique and reviewed goal blood pressure. Recommended to check home blood pressure and heart rate daily 1-2x per week and notify us if it is running above 140/90 often.   Follow Up Plan: PRN  Arbutus Leas, PharmD, BCPS [facility] [phone]

## 2023-10-22 NOTE — Patient Instructions (Signed)
 It was a pleasure speaking with you today!    Feel free to call with any questions or concerns!  Arbutus Leas, PharmD, BCPS, CPP Clinical Pharmacist Practitioner [facility] [phone]

## 2023-10-23 ENCOUNTER — Other Ambulatory Visit: Payer: 59

## 2023-10-23 ENCOUNTER — Other Ambulatory Visit: Payer: Self-pay | Admitting: Cardiovascular Disease

## 2023-11-01 ENCOUNTER — Other Ambulatory Visit: Payer: Self-pay | Admitting: Internal Medicine

## 2023-11-01 DIAGNOSIS — M792 Neuralgia and neuritis, unspecified: Secondary | ICD-10-CM

## 2023-11-09 ENCOUNTER — Ambulatory Visit (INDEPENDENT_AMBULATORY_CARE_PROVIDER_SITE_OTHER): Payer: 59

## 2023-11-09 DIAGNOSIS — E538 Deficiency of other specified B group vitamins: Secondary | ICD-10-CM

## 2023-11-09 MED ORDER — CYANOCOBALAMIN 1000 MCG/ML IJ SOLN
1000.0000 ug | Freq: Once | INTRAMUSCULAR | Status: AC
  Administered 2023-11-09: 1000 ug via INTRAMUSCULAR

## 2023-11-09 NOTE — Progress Notes (Signed)
After obtaining consent, and per orders of Dr. Jones, injection of B12 was given by [place] Rylander P Lior Hoen. Patient instructed to report any adverse reaction to me immediately.  

## 2023-11-11 ENCOUNTER — Ambulatory Visit: Payer: Medicaid Other | Admitting: Podiatry

## 2023-11-14 ENCOUNTER — Other Ambulatory Visit (HOSPITAL_BASED_OUTPATIENT_CLINIC_OR_DEPARTMENT_OTHER): Payer: Self-pay | Admitting: Family

## 2023-11-14 ENCOUNTER — Other Ambulatory Visit: Payer: Self-pay | Admitting: Internal Medicine

## 2023-11-14 DIAGNOSIS — M19011 Primary osteoarthritis, right shoulder: Secondary | ICD-10-CM

## 2023-11-14 DIAGNOSIS — I25118 Atherosclerotic heart disease of native coronary artery with other forms of angina pectoris: Secondary | ICD-10-CM

## 2023-11-14 DIAGNOSIS — M792 Neuralgia and neuritis, unspecified: Secondary | ICD-10-CM

## 2023-11-15 ENCOUNTER — Other Ambulatory Visit: Payer: Self-pay | Admitting: Internal Medicine

## 2023-11-15 DIAGNOSIS — F5104 Psychophysiologic insomnia: Secondary | ICD-10-CM

## 2023-11-17 ENCOUNTER — Ambulatory Visit: Payer: Medicaid Other | Admitting: Podiatry

## 2023-11-17 NOTE — Progress Notes (Unsigned)
Virtual Visit via Video Note  Consent was obtained for video visit:  Yes.   Answered questions that patient had about telehealth interaction:  Yes.   I discussed the limitations, risks, security and privacy concerns of performing an evaluation and management service by telemedicine. I also discussed with the patient that there may be a patient responsible charge related to this service. The patient expressed understanding and agreed to proceed.  Pt location: Home Physician Location: office Name of referring provider:  Etta Grandchild, MD I connected with Jaime Barnes at patients initiation/request on 11/18/2023 at  8:50 AM EST by video enabled telemedicine application and verified that I am speaking with the correct person using two identifiers. Pt MRN:  [id number] Pt DOB:  Apr 16, 1951 Video Participants:  Jaime Barnes  Assessment/Plan:   Migraine without aura, without status migrainosus, not intractable Left parietal headache    Migraine prevention:  topiramate 100mg  twice daily.  Monitor other headache 2.  Migraine rescue:  Nurtec 3.  Limit use of pain relievers to no more than 2 days out of week to prevent risk of rebound or medication-overuse headache. 4.  Keep headache diary 5.  Follow up 6 months  Subjective:  Jaime Barnes is a 73 year old right-handed man with hypertension, bipolar disorder, CKD, tobacco use, Barrett's esophagus and cirrhosis who follows up for migraine.   UPDATEBernita Raisin no longer formulary.  Switched to [employer]	[place].  Intensity:  10/10 Duration:  Within 30 minutes with Nurtec Frequency:  2-3 a month   Started getting the sharp left parietal headaches a couple of months ago.  Occurs every 1 to 3 weeks.  3-4 minutes with Nurtec.      Current NSAIDS:  none Current analgesics:  none Current triptans: none Current anti-emetic:  none Current muscle relaxants:  none Current Antihypertensive medications:  Lisinopril 10mg , verapamil 120mg  three times daily,  metoprolol Current Antidepressant medications:  none Current Anticonvulsant medications: topiramate 100mg  twice daily, gabapentin 300mg  twice daily Current anti-CGRP:  Nurtec 75mg  PRN Current vitamins/supplements:  magnesium oxide 400mg  Other medications:  Risperidone, trazodone     HISTORY: Onset:  Has remote history of migraines but they returned in 2016 after experiencing increased stress related to his ex-girlfriend. Location:  Right sided Quality:  pounding Initial Intensity:  "50"/10 Aura:  no Prodrome:  no Associated symptoms: Photophobia, phonophobia.  No nausea or visual disturbance Initial Duration:  Until takes medication Initial Frequency:  every other day Triggers:  Emotional stress Relieving factors: None Activity:  Cannot function  In 2023, he started getting a squeezing/throbbing pain in his left parietal region last 3-4 minutes.  Squeezing and rubbing head over it and takes a tizanidine helps ease it. They occur once a week.  CTA head on 01/07/2022 personally reviewed revealed mild atherosclerotic plaque in the intercranial ICAs but no significant stenosis or aneurysms.  Those headaches resolved.   Past abortive medication:  ibuprofen, naproxen, Tylenol, Excedrin, sumatriptan (effective but contraindicated due to cardiac history), Ubrelvy 100mg  (effective, not formulary) Past preventative medication:  none Other past therapy:  none   CT of head from 07/19/15 was unremarkable.    MRI of brain from 12/04/14 showed global atrophy and mild small vessel ischemic changes. 06/23/16:  Sed rate 25 Past Medical History: Past Medical History:  Diagnosis Date   Barrett's esophagus 2011   Recommend repeat EGD for surveillance in 2 yrs-[facility]   Bipolar disorder (HCC)    Chronic kidney disease    Cirrhosis (HCC)    Diverticulosis  of colon    GANGLION CYST, TENDON SHEATH 06/26/2010   Hyperlipidemia    Hypertension    Hypertriglyceridemia 01/21/2019   Impotence    Migraine     Pancreatitis    Pulmonary nodule    Pulmonary nodule seen on imaging study 02/06/2011   Followup CT in December 2014 with 20 months of stability at 4 mm.  No further imaging recommended     Medications: Outpatient Encounter Medications as of 11/18/2023  Medication Sig   albuterol (VENTOLIN HFA) 108 (90 Base) MCG/ACT inhaler INHALE 1 TO 2 PUFFS INTO THE LUNGS EVERY 6 HOURS AS NEEDED FOR WHEEZING OR SHORTNESS OF BREATH   amLODipine (NORVASC) 10 MG tablet TAKE 1 TABLET(10 MG) BY MOUTH DAILY   aspirin 81 MG chewable tablet Chew 81 mg by mouth daily.   Budeson-Glycopyrrol-Formoterol (BREZTRI AEROSPHERE) 160-9-4.8 MCG/ACT AERO Inhale 2 puffs into the lungs 2 (two) times daily.   doxycycline (VIBRAMYCIN) 100 MG capsule Take 1 capsule (100 mg total) by mouth 2 (two) times daily.   Ferric Maltol (ACCRUFER) 30 MG CAPS Take 1 capsule by mouth in the morning and at bedtime.   gabapentin (NEURONTIN) 300 MG capsule TAKE 1 CAPSULE(300 MG) BY MOUTH TWICE DAILY   icosapent Ethyl (VASCEPA) 1 g capsule Take 2 capsules (2 g total) by mouth 2 (two) times daily.   magnesium oxide (MAG-OX) 400 (240 Mg) MG tablet Take 1 tablet (400 mg total) by mouth daily.   omeprazole (PRILOSEC) 20 MG capsule Take 1 capsule (20 mg total) by mouth 2 (two) times daily before a meal.   polyethylene glycol (MIRALAX / GLYCOLAX) 17 g packet Take 17 g by mouth daily.   Rimegepant Sulfate (NURTEC) 75 MG TBDP Take 1 tablet (75 mg total) by mouth as needed (take 1 tab as needed at the earlist on set of a Migraine. Max 1 tab in 24 hours).   risperiDONE (RISPERDAL) 1 MG tablet TAKE 1 TABLET(1 MG) BY MOUTH AT BEDTIME   rosuvastatin (CRESTOR) 20 MG tablet TAKE 1 TABLET(20 MG) BY MOUTH DAILY   thiamine (VITAMIN B1) 100 MG tablet Take 1 tablet (100 mg total) by mouth daily.   topiramate (TOPAMAX) 100 MG tablet TAKE 1 TABLET(100 MG) BY MOUTH TWICE DAILY   traMADol (ULTRAM) 50 MG tablet Take 1 tablet (50 mg total) by mouth every 12 (twelve) hours  as needed.   traZODone (DESYREL) 100 MG tablet Take 2 tablets (200 mg total) by mouth at bedtime.   Ubrogepant (UBRELVY) 100 MG TABS TAKE 1 TABLET(100 MG) BY MOUTH 1 TIME EVERY 2 HOURS AS DIRECTED AS NEEDED. MAXIMUM 2 TABLETS IN 24 HOURS   valACYclovir (VALTREX) 500 MG tablet TAKE 1 TABLET(500 MG) BY MOUTH DAILY   Zoster Vaccine Adjuvanted [facility]) injection Inject 0.5 mLs into the muscle once for 1 dose.   No facility-administered encounter medications on file as of 11/18/2023.    Allergies: Allergies  Allergen Reactions   Other     Other reaction(s): rash, sweat, nausea Other reaction(s): rash, sweat, nausea   Codeine Phosphate Other (See Comments)    REACTION: breathing difficulty   Penicillins Rash and Other (See Comments)    Break out in sweats/ Rash amoxicillin. Denies airway involvement Has patient had a PCN reaction causing immediate rash, facial/tongue/throat swelling, SOB or lightheadedness with hypotension: Yes Has patient had a PCN reaction causing severe rash involving mucus membranes or skin necrosis: Yes Has patient had a PCN reaction that required hospitalization No Has patient had a PCN  reaction occurring within the last 10 years: No If all of the above answers are "NO", then may proceed with Cephalosporin u    Family History: Family History  Problem Relation Age of Onset   Anemia Mother    Hypertension Daughter    Diabetes Daughter    Migraines Daughter     Observations/Objective:   No acute distress.  Alert and oriented.  Speech fluent and not dysarthric.  Language intact.     Follow Up Instructions:    -I discussed the assessment and treatment plan with the patient. The patient was provided an opportunity to ask questions and all were answered. The patient agreed with the plan and demonstrated an understanding of the instructions.   The patient was advised to call back or seek an in-person evaluation if the symptoms worsen or if the condition fails to  improve as anticipated.   Cira Servant, DO

## 2023-11-18 ENCOUNTER — Telehealth (INDEPENDENT_AMBULATORY_CARE_PROVIDER_SITE_OTHER): Payer: 59 | Admitting: Neurology

## 2023-11-18 ENCOUNTER — Encounter: Payer: Self-pay | Admitting: Neurology

## 2023-11-18 DIAGNOSIS — G43009 Migraine without aura, not intractable, without status migrainosus: Secondary | ICD-10-CM | POA: Diagnosis not present

## 2023-11-18 DIAGNOSIS — R519 Headache, unspecified: Secondary | ICD-10-CM

## 2023-11-18 MED ORDER — NURTEC 75 MG PO TBDP: 75.0000 mg | ORAL_TABLET | ORAL | 5 refills | Status: DC | PRN

## 2023-11-18 MED ORDER — TOPIRAMATE 100 MG PO TABS: ORAL_TABLET | ORAL | 11 refills | Status: DC

## 2023-11-20 ENCOUNTER — Ambulatory Visit (INDEPENDENT_AMBULATORY_CARE_PROVIDER_SITE_OTHER): Payer: 59

## 2023-11-20 VITALS — Ht 66.0 in | Wt 175.0 lb

## 2023-11-20 DIAGNOSIS — Z0000 Encounter for general adult medical examination without abnormal findings: Secondary | ICD-10-CM | POA: Diagnosis not present

## 2023-11-20 NOTE — Progress Notes (Signed)
Subjective:   Jaime Barnes is a 73 y.o. male who presents for Medicare Annual/Subsequent preventive examination.  Visit Complete: Virtual I connected with  Erven Colla on 11/20/23 by a audio enabled telemedicine application and verified that I am speaking with the correct person using two identifiers.  Patient Location: Home  Provider Location: Home Office  I discussed the limitations of evaluation and management by telemedicine. The patient expressed understanding and agreed to proceed.  Vital Signs: Because this visit was a virtual/telehealth visit, some criteria may be missing or patient reported. Any vitals not documented were not able to be obtained and vitals that have been documented are patient reported.   Cardiac Risk Factors include: advanced age (>37men, >44 women);male gender;Other (see comment);dyslipidemia;smoking/ tobacco exposure, Risk factor comments: CAD, PAD, NSVT, CKD     Objective:    Today's Vitals   11/20/23 1458  Weight: 175 lb (79.4 kg)  Height: 5\' 6"  (1.676 m)   Body mass index is 28.25 kg/m.     11/20/2023    3:08 PM 11/18/2023    8:25 AM 09/01/2022    1:09 PM 09/20/2021    6:52 PM 07/16/2021   10:19 AM 04/02/2021    1:31 PM 11/05/2020    9:19 AM  Advanced Directives  Does Patient Have a Medical Advance Directive? Yes Yes Yes No No No No  Type of Estate agent of [place];Living will Healthcare Power of [place];Living will Healthcare Power of [place];Living will      Copy of Healthcare Power of Attorney in Chart? No - copy requested  No - copy requested      Would patient like information on creating a medical advance directive?    No - Patient declined  No - Patient declined     Current Medications (verified) Outpatient Encounter Medications as of 11/20/2023  Medication Sig   albuterol (VENTOLIN HFA) 108 (90 Base) MCG/ACT inhaler INHALE 1 TO 2 PUFFS INTO THE LUNGS EVERY 6 HOURS AS NEEDED FOR WHEEZING OR SHORTNESS OF BREATH    amLODipine (NORVASC) 10 MG tablet TAKE 1 TABLET(10 MG) BY MOUTH DAILY   aspirin 81 MG chewable tablet Chew 81 mg by mouth daily.   Budeson-Glycopyrrol-Formoterol (BREZTRI AEROSPHERE) 160-9-4.8 MCG/ACT AERO Inhale 2 puffs into the lungs 2 (two) times daily.   Ferric Maltol (ACCRUFER) 30 MG CAPS Take 1 capsule by mouth in the morning and at bedtime.   gabapentin (NEURONTIN) 300 MG capsule TAKE 1 CAPSULE(300 MG) BY MOUTH TWICE DAILY   icosapent Ethyl (VASCEPA) 1 g capsule Take 2 capsules (2 g total) by mouth 2 (two) times daily.   magnesium oxide (MAG-OX) 400 (240 Mg) MG tablet Take 1 tablet (400 mg total) by mouth daily.   omeprazole (PRILOSEC) 20 MG capsule Take 1 capsule (20 mg total) by mouth 2 (two) times daily before a meal.   polyethylene glycol (MIRALAX / GLYCOLAX) 17 g packet Take 17 g by mouth daily.   Rimegepant Sulfate (NURTEC) 75 MG TBDP Take 1 tablet (75 mg total) by mouth as needed (take 1 tab as needed at the earlist on set of a Migraine. Max 1 tab in 24 hours).   risperiDONE (RISPERDAL) 1 MG tablet TAKE 1 TABLET(1 MG) BY MOUTH AT BEDTIME   rosuvastatin (CRESTOR) 20 MG tablet TAKE 1 TABLET(20 MG) BY MOUTH DAILY   thiamine (VITAMIN B1) 100 MG tablet Take 1 tablet (100 mg total) by mouth daily.   topiramate (TOPAMAX) 100 MG tablet TAKE 1 TABLET(100 MG) BY  MOUTH TWICE DAILY   traMADol (ULTRAM) 50 MG tablet Take 1 tablet (50 mg total) by mouth every 12 (twelve) hours as needed.   traZODone (DESYREL) 100 MG tablet TAKE 2 TABLETS(200 MG) BY MOUTH AT BEDTIME   Ubrogepant (UBRELVY) 100 MG TABS TAKE 1 TABLET(100 MG) BY MOUTH 1 TIME EVERY 2 HOURS AS DIRECTED AS NEEDED. MAXIMUM 2 TABLETS IN 24 HOURS   valACYclovir (VALTREX) 500 MG tablet TAKE 1 TABLET(500 MG) BY MOUTH DAILY   Zoster Vaccine Adjuvanted [facility]) injection Inject 0.5 mLs into the muscle once for 1 dose.   No facility-administered encounter medications on file as of 11/20/2023.    Allergies (verified) Other, Codeine phosphate,  and Penicillins   History: Past Medical History:  Diagnosis Date   Barrett's esophagus 2011   Recommend repeat EGD for surveillance in 2 yrs-[facility]   Bipolar disorder (HCC)    Chronic kidney disease    Cirrhosis (HCC)    Diverticulosis of colon    GANGLION CYST, TENDON SHEATH 06/26/2010   Hyperlipidemia    Hypertension    Hypertriglyceridemia 01/21/2019   Impotence    Migraine    Pancreatitis    Pulmonary nodule    Pulmonary nodule seen on imaging study 02/06/2011   Followup CT in December 2014 with 20 months of stability at 4 mm.  No further imaging recommended    Past Surgical History:  Procedure Laterality Date   CARDIAC CATHETERIZATION  2005   Nonobstructive, <40%   TONSILLECTOMY Bilateral 1961   Family History  Problem Relation Age of Onset   Anemia Mother    Hypertension Daughter    Diabetes Daughter    Migraines Daughter    Social History   Socioeconomic History   Marital status: Single    Spouse name: Not on file   Number of children: 8   Years of education: 8   Highest education level: 8th grade  Occupational History   Occupation: RETIRED  Tobacco Use   Smoking status: Light Smoker    Types: Cigars   Smokeless tobacco: Former    Quit date: 03/08/2017   Tobacco comments:    cigar occassionally   Vaping Use   Vaping status: Never Used  Substance and Sexual Activity   Alcohol use: No    Comment: Last drink "30 years ago"   Drug use: Yes    Types: Marijuana    Comment: daily marijuana since teenager.    Sexual activity: Yes    Birth control/protection: Condom    Comment: Multiple sex partners. 60 in last year.  Other Topics Concern   Not on file  Social History Narrative   Pt lives alone. No stairs in home. Pt has completed 8th grade. 2025      Caffeine 3 cups coffee day      Exercise - yes walks and plays tennis   Social Drivers of Health   Financial Resource Strain: Low Risk  (11/20/2023)   Overall Financial Resource Strain (CARDIA)     Difficulty of Paying Living Expenses: Not very hard  Food Insecurity: No Food Insecurity (11/20/2023)   Hunger Vital Sign    Worried About Running Out of Food in the Last Year: Never true    Ran Out of Food in the Last Year: Never true  Transportation Needs: No Transportation Needs (11/20/2023)   PRAPARE - Administrator, Civil Service (Medical): No    Lack of Transportation (Non-Medical): No  Physical Activity: Sufficiently Active (11/20/2023)   Exercise  Vital Sign    Days of Exercise per Week: 3 days    Minutes of Exercise per Session: 90 min  Stress: No Stress Concern Present (11/20/2023)   [employer] of Occupational Health - Occupational Stress Questionnaire    Feeling of Stress : Only a little  Social Connections: Moderately Integrated (11/20/2023)   Social Connection and Isolation Panel [NHANES]    Frequency of Communication with Friends and Family: More than three times a week    Frequency of Social Gatherings with Friends and Family: Once a week    Attends Religious Services: More than 4 times per year    Active Member of [employer] or Organizations: Yes    Attends Banker Meetings: Never    Marital Status: Divorced    Tobacco Counseling Ready to quit: Not Answered Counseling given: Not Answered Tobacco comments: cigar occassionally    Clinical Intake:  Pre-visit preparation completed: Yes  Pain : No/denies pain     BMI - recorded: 28.25 Nutritional Status: BMI 25 -29 Overweight Nutritional Risks: None Diabetes: No  How often do you need to have someone help you when you read instructions, pamphlets, or other written materials from your doctor or pharmacy?: 5 - Always  Interpreter Needed?: No  Information entered by :: Lada Fulbright, RMA   Activities of Daily Living    11/20/2023    2:59 PM  In your present state of health, do you have any difficulty performing the following activities:  Hearing? 0  Vision? 0  Difficulty  concentrating or making decisions? 0  Walking or climbing stairs? 0  Comment try to adviod them  Dressing or bathing? 0  Doing errands, shopping? 0  Preparing Food and eating ? N  Using the Toilet? N  In the past six months, have you accidently leaked urine? N  Do you have problems with loss of bowel control? N  Managing your Medications? N  Managing your Finances? N  Housekeeping or managing your Housekeeping? N    Patient Care Team: Etta Grandchild, MD as PCP - General (Internal Medicine) Chilton Si, MD as PCP - Cardiology (Cardiology) Ned Card, DMD (Dentistry) Chilton Si, MD as Attending Physician (Cardiology) Charlott Rakes, MD as Consulting Physician (Gastroenterology) Drema Dallas, DO as Consulting Physician (Neurology) [facility] as Consulting Physician (Ophthalmology) Bonita Quin, [facility] (Pharmacist)  Indicate any recent Medical Services you may have received from other than [facility] providers in the past year (date may be approximate).     Assessment:   This is a routine wellness examination for [place].  Hearing/Vision screen Hearing Screening - Comments:: Denies hearing difficulties   Vision Screening - Comments:: Denies vision issues.    Goals Addressed               This Visit's Progress     Patient Stated (pt-stated)        Get back to the [facility].  Do more walking once leg is better.      Depression Screen    11/20/2023    3:12 PM 05/22/2023    3:15 PM 02/12/2023   10:27 AM 09/01/2022    1:29 PM 05/07/2022    2:07 PM 08/20/2021    9:28 AM 12/03/2020    2:05 PM  PHQ 2/9 Scores  PHQ - 2 Score 3 4 0 0 3 0 0  PHQ- 9 Score 4 7   9       Fall Risk    11/20/2023  3:09 PM 11/18/2023    8:25 AM 05/22/2023    3:15 PM 02/12/2023   10:26 AM 09/01/2022    1:30 PM  Fall Risk   Falls in the past year? 1 0 0 0 0  Number falls in past yr: 0 0 0 0 0  Injury with Fall? 1 0 0 0 0  Risk for fall due to :   No Fall Risks  Impaired vision No Fall Risks  Follow up Falls evaluation completed;Falls prevention discussed Falls evaluation completed Falls evaluation completed Falls evaluation completed Falls prevention discussed    MEDICARE RISK AT HOME: Medicare Risk at Home Any stairs in or around the home?: No Home free of loose throw rugs in walkways, pet beds, electrical cords, etc?: Yes Adequate lighting in your home to reduce risk of falls?: Yes Life alert?: Yes Use of a cane, walker or w/c?: No Grab bars in the bathroom?: Yes Shower chair or bench in shower?: No Elevated toilet seat or a handicapped toilet?: No  TIMED UP AND Barnes:  Was the test performed?  No    Cognitive Function:        11/20/2023    3:24 PM 09/01/2022    1:01 PM 02/20/2020    9:05 AM 02/14/2019    9:33 AM  6CIT Screen  What Year? 0 points 0 points 0 points 0 points  What month? 0 points 0 points 0 points 0 points  What time? 0 points 0 points  0 points  Count back from 20 0 points 0 points  0 points  Months in reverse 0 points 0 points 4 points 0 points  Repeat phrase 0 points 0 points  2 points  Total Score 0 points 0 points  2 points    Immunizations Immunization History  Administered Date(s) Administered   Fluad Quad(high Dose 65+) 08/15/2019, 07/11/2021, 07/01/2022   Fluad Trivalent(High Dose 65+) 07/06/2023   Influenza Split 08/24/2012   Influenza Whole 09/02/2007, 06/23/2008, 06/09/2011   Influenza, High Dose Seasonal PF 06/27/2017, 06/25/2018   Influenza,inj,Quad PF,6+ Mos 07/29/2013, 06/11/2020   Influenza-Unspecified 08/24/2012, 08/29/2016, 06/27/2017   PFIZER(Purple Top)SARS-COV-2 Vaccination 12/30/2019, 01/23/2020, 10/10/2020   Pneumococcal Conjugate-13 03/18/2016   Pneumococcal Polysaccharide-23 03/27/2017   Td 07/01/2022   Tdap 06/09/2011   Zoster Recombinant(Shingrix) 12/13/2020    TDAP status: Up to date  Flu Vaccine status: Up to date  Pneumococcal vaccine status: Up to date  Covid-19 vaccine  status: Declined, Education has been provided regarding the importance of this vaccine but patient still declined. Advised may receive this vaccine at local pharmacy or Health Dept.or vaccine clinic. Aware to provide a copy of the vaccination record if obtained from local pharmacy or Health Dept. Verbalized acceptance and understanding.  Qualifies for Shingles Vaccine? Yes   Zostavax completed Yes   Shingrix Completed?: No.    Education has been provided regarding the importance of this vaccine. Patient has been advised to call insurance company to determine out of pocket expense if they have not yet received this vaccine. Advised may also receive vaccine at local pharmacy or Health Dept. Verbalized acceptance and understanding.  Screening Tests Health Maintenance  Topic Date Due   Zoster Vaccines- Shingrix (2 of 2) 02/07/2021   Fecal DNA (Cologuard)  03/30/2023   COVID-19 Vaccine (4 - 2024-25 season) 05/31/2023   Medicare Annual Wellness (AWV)  11/19/2024   DTaP/Tdap/Td (3 - Td or Tdap) 07/01/2032   Pneumonia Vaccine 71+ Years old  Completed   INFLUENZA VACCINE  Completed  Hepatitis C Screening  Completed   HPV VACCINES  Aged Out   Colonoscopy  Discontinued    Health Maintenance  Health Maintenance Due  Topic Date Due   Zoster Vaccines- Shingrix (2 of 2) 02/07/2021   Fecal DNA (Cologuard)  03/30/2023   COVID-19 Vaccine (4 - 2024-25 season) 05/31/2023    Colorectal cancer screening: Type of screening: Cologuard. Completed 01/07/2017. Repeat every 3 years  Lung Cancer Screening: (Low Dose CT Chest recommended if Age 62-80 years, 20 pack-year currently smoking OR have quit w/in 15years.) does not qualify.   Lung Cancer Screening Referral: N/A  Additional Screening:  Hepatitis C Screening: does qualify; Completed 01/07/2017  Vision Screening: Recommended annual ophthalmology exams for early detection of glaucoma and other disorders of the eye. Is the patient up to date with  their annual eye exam?  Yes  Who is the provider or what is the name of the office in which the patient attends annual eye exams? Dr. Dione Booze If pt is not established with a provider, would they like to be referred to a provider to establish care? No .   Dental Screening: Recommended annual dental exams for proper oral hygiene    Community Resource Referral / Chronic Care Management: CRR required this visit?  No   CCM required this visit?  No     Plan:     I have personally reviewed and noted the following in the patient's chart:   Medical and social history Use of alcohol, tobacco or illicit drugs  Current medications and supplements including opioid prescriptions. Patient is not currently taking opioid prescriptions. Functional ability and status Nutritional status Physical activity Advanced directives List of other physicians Hospitalizations, surgeries, and ER visits in previous 12 months Vitals Screenings to include cognitive, depression, and falls Referrals and appointments  In addition, I have reviewed and discussed with patient certain preventive protocols, quality metrics, and best practice recommendations. A written personalized care plan for preventive services as well as general preventive health recommendations were provided to patient.     Tashina Credit L Tashya Alberty, CMA   11/20/2023   After Visit Summary: (Mail) Due to this being a telephonic visit, the after visit summary with patients personalized plan was offered to patient via mail   Nurse Notes: Patient Is due for Cologuard and would like to discuss that with Dr. Yetta Barre during his up coming visit next week.

## 2023-11-20 NOTE — Patient Instructions (Signed)
[name] , Thank you for taking time to come for your Medicare Wellness Visit. I appreciate your ongoing commitment to your health goals. Please review the following plan we discussed and let me know if I can assist you in the future.   Referrals/Orders/Follow-Ups/Clinician Recommendations: It was nice talking with you today.  You are due to have another Cologuard.  Please discuss with Dr. Yetta Barre during your next visit.   This is a list of the screening recommended for you and due dates:  Health Maintenance  Topic Date Due   Zoster (Shingles) Vaccine (2 of 2) 02/07/2021   Cologuard (Stool DNA test)  03/30/2023   COVID-19 Vaccine (4 - 2024-25 season) 05/31/2023   Medicare Annual Wellness Visit  11/19/2024   DTaP/Tdap/Td vaccine (3 - Td or Tdap) 07/01/2032   Pneumonia Vaccine  Completed   Flu Shot  Completed   Hepatitis C Screening  Completed   HPV Vaccine  Aged Out   Colon Cancer Screening  Discontinued    Advanced directives: (Copy Requested) Please bring a copy of your health care power of attorney and living will to the office to be added to your chart at your convenience.  Next Medicare Annual Wellness Visit scheduled for next year: Yes

## 2023-11-23 DIAGNOSIS — K7460 Unspecified cirrhosis of liver: Secondary | ICD-10-CM | POA: Diagnosis not present

## 2023-11-23 LAB — BASIC METABOLIC PANEL
BUN: 10 (ref 4–21)
Creatinine: 1.3 (ref 0.6–1.3)
Glucose: 73
Potassium: 3.6 meq/L (ref 3.5–5.1)

## 2023-11-23 LAB — COMPREHENSIVE METABOLIC PANEL: eGFR: 66

## 2023-11-26 ENCOUNTER — Ambulatory Visit (INDEPENDENT_AMBULATORY_CARE_PROVIDER_SITE_OTHER): Payer: 59 | Admitting: Podiatry

## 2023-11-26 ENCOUNTER — Encounter: Payer: Self-pay | Admitting: Podiatry

## 2023-11-26 DIAGNOSIS — M79674 Pain in right toe(s): Secondary | ICD-10-CM

## 2023-11-26 DIAGNOSIS — M79675 Pain in left toe(s): Secondary | ICD-10-CM

## 2023-11-26 DIAGNOSIS — N1832 Chronic kidney disease, stage 3b: Secondary | ICD-10-CM

## 2023-11-26 DIAGNOSIS — B351 Tinea unguium: Secondary | ICD-10-CM | POA: Diagnosis not present

## 2023-11-26 NOTE — Progress Notes (Signed)
This patient returns to my office for at risk foot care.  This patient requires this care by a professional since this patient will be at risk due to having chronic kidney disease  This patient is unable to cut nails himself since the patient cannot reach his nails.These nails are painful walking and wearing shoes.  This patient presents for at risk foot care today.  General Appearance  Alert, conversant and in no acute stress.  Vascular  Dorsalis pedis and posterior tibial  pulses are weakly  palpable  bilaterally.  Capillary return is within normal limits  bilaterally. Temperature is within normal limits  bilaterally.  Neurologic  Senn-Weinstein monofilament wire test within normal limits  bilaterally. Muscle power within normal limits bilaterally.  Nails Thick disfigured discolored nails with subungual debris  from hallux to fifth toes bilaterally. No evidence of bacterial infection or drainage bilaterally.  Orthopedic  No limitations of motion  feet .  No crepitus or effusions noted.  No bony pathology or digital deformities noted.  Skin  normotropic skin with no porokeratosis noted bilaterally.  No signs of infections or ulcers noted.     Onychomycosis  Pain in right toes  Pain in left toes  Consent was obtained for treatment procedures.   Mechanical debridement of nails 1-5  bilaterally performed with a nail nipper.  Filed with dremel without incident.    Return office visit   12  weeks                  Told patient to return for periodic foot care and evaluation due to potential at risk complications.   Helane Gunther DPM

## 2023-12-02 ENCOUNTER — Other Ambulatory Visit: Payer: Self-pay | Admitting: Internal Medicine

## 2023-12-02 ENCOUNTER — Encounter: Payer: Self-pay | Admitting: Internal Medicine

## 2023-12-02 ENCOUNTER — Ambulatory Visit: Payer: 59 | Admitting: Internal Medicine

## 2023-12-02 ENCOUNTER — Ambulatory Visit

## 2023-12-02 VITALS — BP 140/80 | HR 90 | Temp 98.0°F | Ht 66.0 in | Wt 172.2 lb

## 2023-12-02 DIAGNOSIS — I10 Essential (primary) hypertension: Secondary | ICD-10-CM

## 2023-12-02 DIAGNOSIS — K7030 Alcoholic cirrhosis of liver without ascites: Secondary | ICD-10-CM | POA: Diagnosis not present

## 2023-12-02 DIAGNOSIS — M25552 Pain in left hip: Secondary | ICD-10-CM | POA: Diagnosis not present

## 2023-12-02 DIAGNOSIS — I4729 Other ventricular tachycardia: Secondary | ICD-10-CM

## 2023-12-02 DIAGNOSIS — R052 Subacute cough: Secondary | ICD-10-CM | POA: Diagnosis not present

## 2023-12-02 DIAGNOSIS — I2510 Atherosclerotic heart disease of native coronary artery without angina pectoris: Secondary | ICD-10-CM

## 2023-12-02 DIAGNOSIS — R9431 Abnormal electrocardiogram [ECG] [EKG]: Secondary | ICD-10-CM

## 2023-12-02 DIAGNOSIS — D510 Vitamin B12 deficiency anemia due to intrinsic factor deficiency: Secondary | ICD-10-CM

## 2023-12-02 DIAGNOSIS — E785 Hyperlipidemia, unspecified: Secondary | ICD-10-CM

## 2023-12-02 DIAGNOSIS — R079 Chest pain, unspecified: Secondary | ICD-10-CM | POA: Diagnosis not present

## 2023-12-02 DIAGNOSIS — J22 Unspecified acute lower respiratory infection: Secondary | ICD-10-CM | POA: Diagnosis not present

## 2023-12-02 DIAGNOSIS — R918 Other nonspecific abnormal finding of lung field: Secondary | ICD-10-CM | POA: Diagnosis not present

## 2023-12-02 LAB — CBC WITH DIFFERENTIAL/PLATELET
Basophils Absolute: 0.1 10*3/uL (ref 0.0–0.1)
Basophils Relative: 0.9 % (ref 0.0–3.0)
Eosinophils Absolute: 0.1 10*3/uL (ref 0.0–0.7)
Eosinophils Relative: 0.7 % (ref 0.0–5.0)
HCT: 41.8 % (ref 39.0–52.0)
Hemoglobin: 14.2 g/dL (ref 13.0–17.0)
Lymphocytes Relative: 17.6 % (ref 12.0–46.0)
Lymphs Abs: 2.6 10*3/uL (ref 0.7–4.0)
MCHC: 34.1 g/dL (ref 30.0–36.0)
MCV: 96.8 fl (ref 78.0–100.0)
Monocytes Absolute: 1.5 10*3/uL — ABNORMAL HIGH (ref 0.1–1.0)
Monocytes Relative: 10.1 % (ref 3.0–12.0)
Neutro Abs: 10.4 10*3/uL — ABNORMAL HIGH (ref 1.4–7.7)
Neutrophils Relative %: 70.7 % (ref 43.0–77.0)
Platelets: 222 10*3/uL (ref 150.0–400.0)
RBC: 4.32 Mil/uL (ref 4.22–5.81)
RDW: 14.3 % (ref 11.5–15.5)
WBC: 14.7 10*3/uL — ABNORMAL HIGH (ref 4.0–10.5)

## 2023-12-02 LAB — LIPID PANEL
Cholesterol: 104 mg/dL (ref 0–200)
HDL: 40.3 mg/dL (ref >39.00)
LDL Cholesterol: 37 mg/dL (ref 0–99)
NonHDL: 63.5
Total CHOL/HDL Ratio: 3
Triglycerides: 135 mg/dL (ref 0.0–149.0)
VLDL: 27 mg/dL (ref 0.0–40.0)

## 2023-12-02 LAB — BRAIN NATRIURETIC PEPTIDE: Pro B Natriuretic peptide (BNP): 45 pg/mL (ref 0.0–100.0)

## 2023-12-02 LAB — HEPATIC FUNCTION PANEL
ALT: 11 U/L (ref 0–53)
AST: 12 U/L (ref 0–37)
Albumin: 4.5 g/dL (ref 3.5–5.2)
Alkaline Phosphatase: 79 U/L (ref 39–117)
Bilirubin, Direct: 0.1 mg/dL (ref 0.0–0.3)
Total Bilirubin: 0.4 mg/dL (ref 0.2–1.2)
Total Protein: 7.5 g/dL (ref 6.0–8.3)

## 2023-12-02 LAB — TROPONIN I (HIGH SENSITIVITY): High Sens Troponin I: 8 ng/L (ref 2–17)

## 2023-12-02 LAB — C-REACTIVE PROTEIN: CRP: 1.6 mg/dL (ref 0.5–20.0)

## 2023-12-02 LAB — TSH: TSH: 3.6 u[IU]/mL (ref 0.35–5.50)

## 2023-12-02 NOTE — Patient Instructions (Signed)
 Hypertension, Adult High blood pressure (hypertension) is when the force of blood pumping through the arteries is too strong. The arteries are the blood vessels that carry blood from the heart throughout the body. Hypertension forces the heart to work harder to pump blood and may cause arteries to become narrow or stiff. Untreated or uncontrolled hypertension can lead to a heart attack, heart failure, a stroke, kidney disease, and other problems. A blood pressure reading consists of a higher number over a lower number. Ideally, your blood pressure should be below 120/80. The first ("top") number is called the systolic pressure. It is a measure of the pressure in your arteries as your heart beats. The second ("bottom") number is called the diastolic pressure. It is a measure of the pressure in your arteries as the heart relaxes. What are the causes? The exact cause of this condition is not known. There are some conditions that result in high blood pressure. What increases the risk? Certain factors may make you more likely to develop high blood pressure. Some of these risk factors are under your control, including: Smoking. Not getting enough exercise or physical activity. Being overweight. Having too much fat, sugar, calories, or salt (sodium) in your diet. Drinking too much alcohol. Other risk factors include: Having a personal history of heart disease, diabetes, high cholesterol, or kidney disease. Stress. Having a family history of high blood pressure and high cholesterol. Having obstructive sleep apnea. Age. The risk increases with age. What are the signs or symptoms? High blood pressure may not cause symptoms. Very high blood pressure (hypertensive crisis) may cause: Headache. Fast or irregular heartbeats (palpitations). Shortness of breath. Nosebleed. Nausea and vomiting. Vision changes. Severe chest pain, dizziness, and seizures. How is this diagnosed? This condition is diagnosed by  measuring your blood pressure while you are seated, with your arm resting on a flat surface, your legs uncrossed, and your feet flat on the floor. The cuff of the blood pressure monitor will be placed directly against the skin of your upper arm at the level of your heart. Blood pressure should be measured at least twice using the same arm. Certain conditions can cause a difference in blood pressure between your right and left arms. If you have a high blood pressure reading during one visit or you have normal blood pressure with other risk factors, you may be asked to: Return on a different day to have your blood pressure checked again. Monitor your blood pressure at home for 1 week or longer. If you are diagnosed with hypertension, you may have other blood or imaging tests to help your health care provider understand your overall risk for other conditions. How is this treated? This condition is treated by making healthy lifestyle changes, such as eating healthy foods, exercising more, and reducing your alcohol intake. You may be referred for counseling on a healthy diet and physical activity. Your health care provider may prescribe medicine if lifestyle changes are not enough to get your blood pressure under control and if: Your systolic blood pressure is above 130. Your diastolic blood pressure is above 80. Your personal target blood pressure may vary depending on your medical conditions, your age, and other factors. Follow these instructions at home: Eating and drinking  Eat a diet that is high in fiber and potassium, and low in sodium, added sugar, and fat. An example of this eating plan is called the DASH diet. DASH stands for Dietary Approaches to Stop Hypertension. To eat this way: Eat  plenty of fresh fruits and vegetables. Try to fill one half of your plate at each meal with fruits and vegetables. Eat whole grains, such as whole-wheat pasta, brown rice, or whole-grain bread. Fill about one  fourth of your plate with whole grains. Eat or drink low-fat dairy products, such as skim milk or low-fat yogurt. Avoid fatty cuts of meat, processed or cured meats, and poultry with skin. Fill about one fourth of your plate with lean proteins, such as fish, chicken without skin, beans, eggs, or tofu. Avoid pre-made and processed foods. These tend to be higher in sodium, added sugar, and fat. Reduce your daily sodium intake. Many people with hypertension should eat less than 1,500 mg of sodium a day. Do not drink alcohol if: Your health care provider tells you not to drink. You are pregnant, may be pregnant, or are planning to become pregnant. If you drink alcohol: Limit how much you have to: 0-1 drink a day for women. 0-2 drinks a day for men. Know how much alcohol is in your drink. In the [place] one drink equals one 12 oz bottle of beer (355 mL), one 5 oz glass of wine (148 mL), or one 1 oz glass of hard liquor (44 mL). Lifestyle  Work with your health care provider to maintain a healthy body weight or to lose weight. Ask what an ideal weight is for you. Get at least 30 minutes of exercise that causes your heart to beat faster (aerobic exercise) most days of the week. Activities may include walking, swimming, or biking. Include exercise to strengthen your muscles (resistance exercise), such as Pilates or lifting weights, as part of your weekly exercise routine. Try to do these types of exercises for 30 minutes at least 3 days a week. Do not use any products that contain nicotine or tobacco. These products include cigarettes, chewing tobacco, and vaping devices, such as e-cigarettes. If you need help quitting, ask your health care provider. Monitor your blood pressure at home as told by your health care provider. Keep all follow-up visits. This is important. Medicines Take over-the-counter and prescription medicines only as told by your health care provider. Follow directions carefully. Blood  pressure medicines must be taken as prescribed. Do not skip doses of blood pressure medicine. Doing this puts you at risk for problems and can make the medicine less effective. Ask your health care provider about side effects or reactions to medicines that you should watch for. Contact a health care provider if you: Think you are having a reaction to a medicine you are taking. Have headaches that keep coming back (recurring). Feel dizzy. Have swelling in your ankles. Have trouble with your vision. Get help right away if you: Develop a severe headache or confusion. Have unusual weakness or numbness. Feel faint. Have severe pain in your chest or abdomen. Vomit repeatedly. Have trouble breathing. These symptoms may be an emergency. Get help right away. Call 911. Do not wait to see if the symptoms will go away. Do not drive yourself to the hospital. Summary Hypertension is when the force of blood pumping through your arteries is too strong. If this condition is not controlled, it may put you at risk for serious complications. Your personal target blood pressure may vary depending on your medical conditions, your age, and other factors. For most people, a normal blood pressure is less than 120/80. Hypertension is treated with lifestyle changes, medicines, or a combination of both. Lifestyle changes include losing weight, eating a healthy,  low-sodium diet, exercising more, and limiting alcohol. This information is not intended to replace advice given to you by your health care provider. Make sure you discuss any questions you have with your health care provider. Document Revised: 07/23/2021 Document Reviewed: 07/23/2021 Elsevier Patient Education  2024 [employer].

## 2023-12-02 NOTE — Progress Notes (Signed)
 Subjective:  Patient ID: Jaime Barnes, male    DOB: [birth year]  Age: 73 y.o. MRN: [id number]  CC: Hypertension and Cough   HPI Shalev Helminiak presents for f/up ----  Discussed the use of AI scribe software for clinical note transcription with the patient, who gave verbal consent to proceed.  History of Present Illness   Wilho Sharpley is a 73 year old male who presents with ear pain and left hip pain.  He has been experiencing left hip pain since yesterday, located just above the left hip. The pain is exacerbated by movements such as getting up and down. He has not tried putting weight on it other than his body weight, which he can do, but not comfortably. There is no history of recent injury to the area.  He also has ear pain that started a couple of weeks ago. The pain is described as aggravating but does not affect his hearing. He denies dizziness or lightheadedness but notes a sensation of something dripping from the ear.  No chest pain, shortness of breath, fever, or chills. He mentions coughing up a significant amount of dark yellow phlegm over the past week.        Outpatient Medications Prior to Visit  Medication Sig Dispense Refill   albuterol (VENTOLIN HFA) 108 (90 Base) MCG/ACT inhaler INHALE 1 TO 2 PUFFS INTO THE LUNGS EVERY 6 HOURS AS NEEDED FOR WHEEZING OR SHORTNESS OF BREATH 18 g 5   amLODipine (NORVASC) 10 MG tablet TAKE 1 TABLET(10 MG) BY MOUTH DAILY 90 tablet 1   aspirin 81 MG chewable tablet Chew 81 mg by mouth daily.     Budeson-Glycopyrrol-Formoterol (BREZTRI AEROSPHERE) 160-9-4.8 MCG/ACT AERO Inhale 2 puffs into the lungs 2 (two) times daily. 10.7 g 5   Ferric Maltol (ACCRUFER) 30 MG CAPS Take 1 capsule by mouth in the morning and at bedtime. 180 capsule 1   gabapentin (NEURONTIN) 300 MG capsule TAKE 1 CAPSULE(300 MG) BY MOUTH TWICE DAILY 180 capsule 1   icosapent Ethyl (VASCEPA) 1 g capsule Take 2 capsules (2 g total) by mouth 2 (two) times daily. 360 capsule 0    magnesium oxide (MAG-OX) 400 (240 Mg) MG tablet Take 1 tablet (400 mg total) by mouth daily. 90 tablet 0   omeprazole (PRILOSEC) 20 MG capsule Take 1 capsule (20 mg total) by mouth 2 (two) times daily before a meal. 180 capsule 0   polyethylene glycol (MIRALAX / GLYCOLAX) 17 g packet Take 17 g by mouth daily. 14 each 0   Rimegepant Sulfate (NURTEC) 75 MG TBDP Take 1 tablet (75 mg total) by mouth as needed (take 1 tab as needed at the earlist on set of a Migraine. Max 1 tab in 24 hours). 8 tablet 5   risperiDONE (RISPERDAL) 1 MG tablet TAKE 1 TABLET(1 MG) BY MOUTH AT BEDTIME 90 tablet 0   rosuvastatin (CRESTOR) 20 MG tablet TAKE 1 TABLET(20 MG) BY MOUTH DAILY 90 tablet 0   thiamine (VITAMIN B1) 100 MG tablet Take 1 tablet (100 mg total) by mouth daily. 90 tablet 1   topiramate (TOPAMAX) 100 MG tablet TAKE 1 TABLET(100 MG) BY MOUTH TWICE DAILY 60 tablet 11   traMADol (ULTRAM) 50 MG tablet Take 1 tablet (50 mg total) by mouth every 12 (twelve) hours as needed. 180 tablet 0   traZODone (DESYREL) 100 MG tablet TAKE 2 TABLETS(200 MG) BY MOUTH AT BEDTIME 180 tablet 1   Ubrogepant (UBRELVY) 100 MG TABS TAKE 1 TABLET(100 MG) BY  MOUTH 1 TIME EVERY 2 HOURS AS DIRECTED AS NEEDED. MAXIMUM 2 TABLETS IN 24 HOURS 16 tablet 11   valACYclovir (VALTREX) 500 MG tablet TAKE 1 TABLET(500 MG) BY MOUTH DAILY 90 tablet 0   Zoster Vaccine Adjuvanted [facility]) injection Inject 0.5 mLs into the muscle once for 1 dose. 0.5 mL 1   No facility-administered medications prior to visit.    ROS Review of Systems  Constitutional:  Negative for appetite change, chills, diaphoresis, fatigue and fever.  HENT: Negative.  Negative for trouble swallowing.   Respiratory:  Positive for cough. Negative for chest tightness, shortness of breath and wheezing.   Cardiovascular:  Negative for chest pain, palpitations and leg swelling.  Gastrointestinal: Negative.  Negative for abdominal pain, constipation, diarrhea, nausea and vomiting.   Genitourinary:  Negative for difficulty urinating and dysuria.  Musculoskeletal:  Positive for arthralgias. Negative for back pain and myalgias.  Skin: Negative.   Hematological:  Negative for adenopathy. Does not bruise/bleed easily.  Psychiatric/Behavioral:  Positive for confusion and decreased concentration. Negative for self-injury. The patient is not nervous/anxious.     Objective:  BP (!) 140/80 (BP Location: Left Arm, Patient Position: Sitting, Cuff Size: Normal)   Pulse 90   Temp 98 F (36.7 C) (Oral)   Ht 5\' 6"  (1.676 m)   Wt 172 lb 3.2 oz (78.1 kg)   SpO2 95%   BMI 27.79 kg/m   BP Readings from Last 3 Encounters:  12/02/23 (!) 140/80  07/06/23 126/74  05/22/23 124/78    Wt Readings from Last 3 Encounters:  12/02/23 172 lb 3.2 oz (78.1 kg)  11/20/23 175 lb (79.4 kg)  07/06/23 175 lb (79.4 kg)    Physical Exam Vitals reviewed.  Constitutional:      General: He is not in acute distress.    Appearance: He is not ill-appearing, toxic-appearing or diaphoretic.  HENT:     Right Ear: Hearing, tympanic membrane, ear canal and external ear normal. No drainage. No middle ear effusion. There is no impacted cerumen. Tympanic membrane is not injected.     Left Ear: Hearing, tympanic membrane, ear canal and external ear normal. No drainage.  No middle ear effusion. There is no impacted cerumen. Tympanic membrane is not injected.     Mouth/Throat:     Mouth: Mucous membranes are moist. No injury, lacerations, oral lesions or angioedema.     Dentition: Normal dentition. No dental tenderness, gingival swelling, dental caries, dental abscesses or gum lesions.     Tongue: No lesions.     Palate: No mass and lesions.     Pharynx: Oropharynx is clear. No pharyngeal swelling, oropharyngeal exudate or postnasal drip.     Tonsils: No tonsillar exudate or tonsillar abscesses.  Eyes:     General: No scleral icterus.    Conjunctiva/sclera: Conjunctivae normal.  Cardiovascular:      Rate and Rhythm: Normal rate and regular rhythm.     Pulses: Normal pulses.     Heart sounds: Normal heart sounds, S1 normal and S2 normal. No murmur heard.    No gallop.     Comments: EKG---  NSR with SA, 74 bpm NS T wave changes Mild LVH No Q waves Pulmonary:     Effort: Pulmonary effort is normal.     Breath sounds: No stridor. No wheezing, rhonchi or rales.  Abdominal:     General: Abdomen is flat.     Palpations: There is no mass.     Tenderness: There is no abdominal  tenderness. There is no guarding.     Hernia: No hernia is present.  Musculoskeletal:     Cervical back: Neck supple.     Right hip: Normal.     Left hip: No deformity, bony tenderness or crepitus. Normal range of motion. Normal strength.     Right lower leg: No edema.     Left lower leg: No edema.  Skin:    General: Skin is warm and dry.     Findings: No rash.  Neurological:     General: No focal deficit present.     Mental Status: He is alert. Mental status is at baseline.     Lab Results  Component Value Date   WBC 14.7 (H) 12/02/2023   HGB 14.2 12/02/2023   HCT 41.8 12/02/2023   PLT 222.0 12/02/2023   GLUCOSE 93 07/06/2023   CHOL 104 12/02/2023   TRIG 135.0 12/02/2023   HDL 40.30 12/02/2023   LDLDIRECT 156 (H) 06/09/2011   LDLCALC 37 12/02/2023   ALT 11 12/02/2023   AST 12 12/02/2023   NA 140 07/06/2023   K 3.6 11/23/2023   CL 107 07/06/2023   CREATININE 1.3 11/23/2023   BUN 10 11/23/2023   CO2 24 07/06/2023   TSH 3.60 12/02/2023   PSA 1.08 01/01/2023   INR 1.2 (H) 12/02/2023   HGBA1C 5.8 (H) 12/03/2020    US Abdomen Complete Result Date: 07/12/2023 CLINICAL DATA:  CIRRHOSIS EXAM: ABDOMEN ULTRASOUND COMPLETE COMPARISON:  12/01/2022. FINDINGS: Gallbladder: No gallstones or wall thickening visualized. No pericholecystic fluid. Common bile duct: Diameter: 0.2cm. Liver: Hepatic parenchyma is heterogeneous consistent with the history of cirrhosis. No focal lesion identified. Normal  homogeneous echogenicity. Hepatopetal portal vein flow. IVC: No abnormality visualized. Pancreas: Visualized portion unremarkable. Spleen: Size and appearance within normal limits. Right Kidney: Length: 10.2cm. Echogenicity within normal limits. No mass or hydronephrosis visualized. Left Kidney: Length: 10.2cm. Echogenicity within normal limits. No hydronephrosis. Upper pole cyst measures 1.1 cm. No follow up recommended. Abdominal aorta: No aneurysm visualized. IMPRESSION: Small left kidney cyst. Heterogeneous hepatic parenchyma consistent with the history of cirrhosis. Electronically Signed   By: Layla Maw M.D.   On: 07/12/2023 18:33   DG HIP UNILAT WITH PELVIS 2-3 VIEWS LEFT Result Date: 12/02/2023 CLINICAL DATA:  Acute pain. EXAM: DG HIP (WITH OR WITHOUT PELVIS) 2-3V LEFT COMPARISON:  Hip radiograph 10/01/2022 FINDINGS: No acute fracture of the pelvis or left hip. No hip dislocation. Hip joint spaces preserved. Minor acetabular spurring. No erosion or evidence of avascular necrosis. Pubic rami are intact. Pubic symphysis and sacroiliac joints are congruent. Extensive vertebral calcifications. IMPRESSION: Minor degenerative acetabular spurring. No acute fracture or dislocation. Electronically Signed   By: Narda Rutherford M.D.   On: 12/02/2023 16:36   DG Chest 2 View Result Date: 12/02/2023 CLINICAL DATA:  Pain. EXAM: CHEST - 2 VIEW COMPARISON:  04/02/2021 FINDINGS: The cardiomediastinal contours are normal. Chronic hyperinflation and bronchial thickening. Pulmonary vasculature is normal. No consolidation, pleural effusion, or pneumothorax. No acute osseous abnormalities are seen. IMPRESSION: Chronic hyperinflation and bronchial thickening.  No acute findings. Electronically Signed   By: Narda Rutherford M.D.   On: 12/02/2023 16:35     Assessment & Plan:  Subacute cough -     DG Chest 2 View; Future  Acute hip pain, left -     CBC with Differential/Platelet; Future -     C-reactive protein;  Future  Hypertension, unspecified type -     EKG 12-Lead  NSVT (nonsustained  ventricular tachycardia) (HCC)- HR is normal.   Vitamin B12 deficiency anemia due to intrinsic factor deficiency -     CBC with Differential/Platelet; Future  Alcoholic cirrhosis of liver without ascites (HCC) -     Hepatic function panel; Future -     Protime-INR; Future  Hyperlipidemia LDL goal <70 -     Lipid panel; Future -     TSH; Future  Coronary artery disease involving native coronary artery, unspecified whether angina present, unspecified whether native or transplanted heart -     Troponin I (High Sensitivity); Future -     Brain natriuretic peptide; Future  Abnormal electrocardiogram (ECG) (EKG) -     Troponin I (High Sensitivity); Future -     Brain natriuretic peptide; Future  Primary hypertension- His BP is adequately well controlled.  LRTI (lower respiratory tract infection) -     Cefpodoxime Proxetil; Take 1 tablet (200 mg total) by mouth 2 (two) times daily for 7 days.  Dispense: 14 tablet; Refill: 0     Follow-up: Return in about 3 months (around 03/03/2024).  Sanda Linger, MD

## 2023-12-03 LAB — PROTIME-INR
INR: 1.2 ratio — ABNORMAL HIGH (ref 0.8–1.0)
Prothrombin Time: 12.9 s (ref 9.6–13.1)

## 2023-12-03 MED ORDER — CEFPODOXIME PROXETIL 200 MG PO TABS
200.0000 mg | ORAL_TABLET | Freq: Two times a day (BID) | ORAL | 0 refills | Status: AC
Start: 2023-12-03 — End: 2023-12-10

## 2023-12-04 ENCOUNTER — Other Ambulatory Visit: Payer: Self-pay | Admitting: Gastroenterology

## 2023-12-04 ENCOUNTER — Telehealth: Payer: Self-pay | Admitting: Pharmacist

## 2023-12-04 ENCOUNTER — Other Ambulatory Visit (HOSPITAL_COMMUNITY): Payer: Self-pay

## 2023-12-04 ENCOUNTER — Telehealth: Payer: Self-pay

## 2023-12-04 DIAGNOSIS — K7030 Alcoholic cirrhosis of liver without ascites: Secondary | ICD-10-CM

## 2023-12-04 NOTE — Telephone Encounter (Signed)
 Pharmacy Patient Advocate Encounter  Insurance verification completed.   The patient is insured through [facility]   Ran test claim for Nurtec 75mg . No PA needed at this time.  This test claim was processed through [facility]- copay amounts may vary at other pharmacies due to pharmacy/plan contracts, or as the patient moves through the different stages of their insurance plan.

## 2023-12-04 NOTE — Telephone Encounter (Signed)
 Copied from CRM [id number]. Topic: General - Other >> Dec 04, 2023 11:54 AM Almira Coaster wrote: Reason for CRM: Patient is returning a call he received from the office; however, no documentation on file/

## 2023-12-07 ENCOUNTER — Ambulatory Visit (INDEPENDENT_AMBULATORY_CARE_PROVIDER_SITE_OTHER): Payer: 59

## 2023-12-07 ENCOUNTER — Telehealth: Payer: Self-pay

## 2023-12-07 DIAGNOSIS — D510 Vitamin B12 deficiency anemia due to intrinsic factor deficiency: Secondary | ICD-10-CM | POA: Diagnosis not present

## 2023-12-07 MED ORDER — CYANOCOBALAMIN 1000 MCG/ML IJ SOLN
1000.0000 ug | Freq: Once | INTRAMUSCULAR | Status: AC
  Administered 2023-12-07: 1000 ug via INTRAMUSCULAR

## 2023-12-07 NOTE — Progress Notes (Signed)
 Patient visits today for their b-12 injection. Patient informed of what they had received and tolerated injection well. Patient notified to reach out to office if needed.

## 2023-12-07 NOTE — Telephone Encounter (Signed)
 Patient visited today for their b-12 injection. During the visit patient had explained that during his last visit he thought he would be receiving two different antibiotics. Looking at the visit notes for that visit 12/02/23 I only see the Cefpodoxime Proxetil. Please advise

## 2023-12-09 DIAGNOSIS — K2970 Gastritis, unspecified, without bleeding: Secondary | ICD-10-CM | POA: Diagnosis not present

## 2023-12-09 DIAGNOSIS — K7460 Unspecified cirrhosis of liver: Secondary | ICD-10-CM | POA: Diagnosis not present

## 2023-12-09 DIAGNOSIS — Z860100 Personal history of colon polyps, unspecified: Secondary | ICD-10-CM | POA: Diagnosis not present

## 2023-12-09 DIAGNOSIS — K5730 Diverticulosis of large intestine without perforation or abscess without bleeding: Secondary | ICD-10-CM | POA: Diagnosis not present

## 2023-12-09 DIAGNOSIS — Z09 Encounter for follow-up examination after completed treatment for conditions other than malignant neoplasm: Secondary | ICD-10-CM | POA: Diagnosis not present

## 2023-12-09 DIAGNOSIS — D125 Benign neoplasm of sigmoid colon: Secondary | ICD-10-CM | POA: Diagnosis not present

## 2023-12-09 DIAGNOSIS — K635 Polyp of colon: Secondary | ICD-10-CM | POA: Diagnosis not present

## 2023-12-09 NOTE — Telephone Encounter (Signed)
 Advised patient that only 1 antibiotic was sent in for him. Please only take the one medication that was sent in as prescribed. He gave a verbal understanding.

## 2023-12-09 NOTE — Telephone Encounter (Signed)
 This has been handled by other means of communication. Unsure who called patient but I have already spoken to him.

## 2023-12-12 ENCOUNTER — Other Ambulatory Visit: Payer: Self-pay | Admitting: Internal Medicine

## 2023-12-12 DIAGNOSIS — M19011 Primary osteoarthritis, right shoulder: Secondary | ICD-10-CM

## 2023-12-12 DIAGNOSIS — M792 Neuralgia and neuritis, unspecified: Secondary | ICD-10-CM

## 2023-12-14 ENCOUNTER — Other Ambulatory Visit

## 2023-12-19 ENCOUNTER — Other Ambulatory Visit: Payer: Self-pay | Admitting: Internal Medicine

## 2023-12-19 DIAGNOSIS — F319 Bipolar disorder, unspecified: Secondary | ICD-10-CM

## 2023-12-22 ENCOUNTER — Ambulatory Visit
Admission: RE | Admit: 2023-12-22 | Discharge: 2023-12-22 | Disposition: A | Discharge status: Home / Self Care | Source: Ambulatory Visit | Attending: Gastroenterology | Admitting: Gastroenterology

## 2023-12-22 DIAGNOSIS — K7030 Alcoholic cirrhosis of liver without ascites: Secondary | ICD-10-CM

## 2023-12-22 DIAGNOSIS — K7460 Unspecified cirrhosis of liver: Secondary | ICD-10-CM | POA: Diagnosis not present

## 2024-01-06 ENCOUNTER — Ambulatory Visit (INDEPENDENT_AMBULATORY_CARE_PROVIDER_SITE_OTHER)

## 2024-01-06 DIAGNOSIS — E538 Deficiency of other specified B group vitamins: Secondary | ICD-10-CM

## 2024-01-06 MED ORDER — CYANOCOBALAMIN 1000 MCG/ML IJ SOLN
1000.0000 ug | Freq: Once | INTRAMUSCULAR | Status: AC
  Administered 2024-01-06: 1000 ug via INTRAMUSCULAR

## 2024-01-06 NOTE — Progress Notes (Signed)
 After obtaining consent, and per orders of Dr. Yetta Barre, injection of B12 given by Ferdie Ping. Patient instructed to report any adverse reaction to me immediately.

## 2024-01-07 ENCOUNTER — Other Ambulatory Visit: Payer: Self-pay | Admitting: Internal Medicine

## 2024-01-07 ENCOUNTER — Other Ambulatory Visit: Payer: Self-pay | Admitting: Neurology

## 2024-01-07 DIAGNOSIS — F319 Bipolar disorder, unspecified: Secondary | ICD-10-CM

## 2024-01-10 ENCOUNTER — Other Ambulatory Visit: Payer: Self-pay | Admitting: Internal Medicine

## 2024-01-10 DIAGNOSIS — B009 Herpesviral infection, unspecified: Secondary | ICD-10-CM

## 2024-01-12 ENCOUNTER — Other Ambulatory Visit (HOSPITAL_BASED_OUTPATIENT_CLINIC_OR_DEPARTMENT_OTHER): Payer: Self-pay | Admitting: Family

## 2024-01-12 ENCOUNTER — Other Ambulatory Visit: Payer: Self-pay | Admitting: Internal Medicine

## 2024-01-12 DIAGNOSIS — J418 Mixed simple and mucopurulent chronic bronchitis: Secondary | ICD-10-CM

## 2024-01-12 DIAGNOSIS — I25118 Atherosclerotic heart disease of native coronary artery with other forms of angina pectoris: Secondary | ICD-10-CM

## 2024-02-03 ENCOUNTER — Encounter (HOSPITAL_BASED_OUTPATIENT_CLINIC_OR_DEPARTMENT_OTHER): Payer: Self-pay | Admitting: Cardiovascular Disease

## 2024-02-03 ENCOUNTER — Ambulatory Visit (INDEPENDENT_AMBULATORY_CARE_PROVIDER_SITE_OTHER): Payer: 59 | Admitting: Cardiovascular Disease

## 2024-02-03 VITALS — BP 144/76 | HR 95 | Ht 66.0 in | Wt 172.4 lb

## 2024-02-03 DIAGNOSIS — I10 Essential (primary) hypertension: Secondary | ICD-10-CM | POA: Diagnosis not present

## 2024-02-03 DIAGNOSIS — E785 Hyperlipidemia, unspecified: Secondary | ICD-10-CM | POA: Diagnosis not present

## 2024-02-03 DIAGNOSIS — I2510 Atherosclerotic heart disease of native coronary artery without angina pectoris: Secondary | ICD-10-CM

## 2024-02-03 NOTE — Patient Instructions (Signed)
 Medication Instructions:  Your physician recommends that you continue on your current medications as directed. Please refer to the Current Medication list given to you today.   Follow-Up: Pharmd- 2 months   &  Please follow up in 1 year  with Dr. Theodis Fiscal, Slater Duncan, NP or Neomi Banks, NP   Other Instructions Please track you blood pressure daily until your next visit, two hours after medications and after sitting and resting for 5-10 minutes. Please brign your blood pressure cuff and your log with you to your next appointment.

## 2024-02-03 NOTE — Progress Notes (Signed)
 Cardiology Office Note:  .   Date:  02/03/2024  ID:  Jaime Barnes, DOB 09-Jan-1951, MRN [id number] PCP: Arcadio Knuckles, MD  [facility] Providers Cardiologist:  Maudine Sos, MD    History of Present Illness: .    Jaime Barnes is a 73 y.o. male with hypertension, hyperlipidemia non-obstructive CAD, moderate PAD, CKD 3, tobacco abuse, and mild carotid stenosis who presents for follow up.  [name] was evaluated by his PCP's office on 04/2015 with symptoms concerning for claudication.  He reported one month of leg cramping and pain when walking up an incline. He had no pain when walking down hill or on flat land.  He underwent ABI testing that was negative for occlusive disease.  He was referred to cardiology due to suspicion that this was a false negative.  He was referred for exercise ABIs that were normal but Doppler showed 30-49% SFA disease without focal stenosis.  The L peroneal was occluded.  [name] had a LHC 06/2004 that revealed 40% LAD, [name]  LVEF was 50%. Repeat ABIs 06/2019 were unchanged.     HCTZ-lisinopril  was switched to amlodipine  2/2 worsening renal function.  He followed up with Hao Meng, PA on 04/2017 and was doing well.  He again saw Hao 04/2018 for dizziness.  He was referred for carotid Dopplers that revealed mild stenosis bilaterally.  He also had a 30-day event monitor that showed sinus rhythm with occasional PACs and PVCs.  He also had a 5 beat run of NSVT.  [name] was referred for a Lexiscan  Myoview  06/2018 that revealed LVEF 47% and no ischemia.  Mr. Croney was referred for repeat ABIs 06/2018 which were normal.  He had a screening abdominal aorta ultrasound that revealed 50-74% of the right common iliac artery. His abdominal aorta was not dilated.  He has been bradycardic but was asymptomatic.  Verapamil  was stopped and lisinopril  was increased.  At his last appointment he had stopped smoking cigarettes but was still smoking cigars.   At his  visit 01/2022 he was cutting back on smoking. He was referred to the PREP program. He saw Neomi Banks, NP 05/2022 and was doing well.   Discussed the use of AI scribe software for clinical note transcription with the patient, who gave verbal consent to proceed.  History of Present Illness [name] has no recent issues with chest pain or breathing difficulties. He actively exercises at the [facility], engaging in weight lifting, treadmill walking, and cycling, which he enjoys and feels good while doing. He has also lost some weight recently.  He monitors his blood pressure at home, with readings generally around 130 systolic. He is currently taking amlodipine  for blood pressure management and reports no symptoms of hypotension such as lightheadedness or dizziness. His current medications also include a baby aspirin , Vascepa , and rosuvastatin  for cholesterol management.  He experiences pain in his left leg, described as a persistent pain down the backside of the leg and into the waist area. Previous x-rays and tests, including an EKG and imaging for arterial blockages, have returned normal results. The pain does not extend past the knee.  He is gradually reducing his smoking, currently smoking two BRK cigars per day, which is an improvement from previous habits.   ROS:  As per HPI  Studies Reviewed: Aaron Aas       ABI 06/2022: Summary:  Right: Resting right ankle-brachial index is within normal range. The  right toe-brachial index is normal.  Left: Resting left ankle-brachial index is within normal range. The left  toe-brachial index is normal.   Risk Assessment/Calculations:     HYPERTENSION CONTROL Vitals:   02/03/24 0818 02/03/24 0830  BP: (!) 144/72 (!) 144/76    The patient's blood pressure is elevated above target today.  In order to address the patient's elevated BP: Blood pressure will be monitored at home to determine if medication changes need to be made.        Physical Exam:    VS:  BP (!) 144/76   Pulse 95   Ht 5\' 6"  (1.676 m)   Wt 172 lb 6.4 oz (78.2 kg)   SpO2 95%   BMI 27.83 kg/m  , BMI Body mass index is 27.83 kg/m. GENERAL:  Well appearing HEENT: Pupils equal round and reactive, fundi not visualized, oral mucosa unremarkable NECK:  No jugular venous distention, waveform within normal limits, carotid upstroke brisk and symmetric, no bruits, no thyromegaly LUNGS:  Clear to auscultation bilaterally HEART:  RRR.  PMI not displaced or sustained,S1 and S2 within normal limits, no S3, no S4, no clicks, no rubs, no murmurs ABD:  Flat, positive bowel sounds normal in frequency in pitch, no bruits, no rebound, no guarding, no midline pulsatile mass, no hepatomegaly, no splenomegaly EXT:  2 plus pulses throughout, no edema, no cyanosis no clubbing SKIN:  No rashes no nodules NEURO:  Cranial nerves II through XII grossly intact, motor grossly intact throughout PSYCH:  Cognitively intact, oriented to person place and time   ASSESSMENT AND PLAN: .    Assessment & Plan # Hypertension Hypertension managed with amlodipine . Home readings average 130 mmHg systolic; in-office reading 144/76 mmHg suggests possible suboptimal control. No hypotension symptoms reported.  He was previously on benazepril , hydrochlorothiazide , and lisinopril .  BP goal <130/80. - Track blood pressure at home and bring log to next appointment. - Follow up with pharmacist in two months to review blood pressure readings and ensure accuracy of home blood pressure machine. - Reassess blood pressure management in two months based on home readings.  # Hyperlipidemia:  LDL was 37.  Triglycerides now controlled on Vascepa .  Continue rosuvastatin .   # Non-obstructive CAD:  Continue aspirin  and lipid control as above.   # Arthritis of the hip Chronic left hip pain likely due to arthritis. Pain localized to back of thigh, not extending past knee. Previous imaging showed no significant findings.  Differential includes sciatica, but arthritis is primary consideration.  # Tobacco abuse:  He continues to cut back.  Down to 2 daily.  Encouraged him to be down to 1 daily by PharmD follow up.       Dispo: f/u in 2 months with PharmD. F/u with Ellina Sivertsen C. Theodis Fiscal, MD, [facility] ini 1 year.   Signed, Maudine Sos, MD

## 2024-02-06 ENCOUNTER — Other Ambulatory Visit (HOSPITAL_BASED_OUTPATIENT_CLINIC_OR_DEPARTMENT_OTHER): Payer: Self-pay | Admitting: Family

## 2024-02-06 ENCOUNTER — Other Ambulatory Visit: Payer: Self-pay | Admitting: Internal Medicine

## 2024-02-06 DIAGNOSIS — G43009 Migraine without aura, not intractable, without status migrainosus: Secondary | ICD-10-CM

## 2024-02-06 DIAGNOSIS — I25118 Atherosclerotic heart disease of native coronary artery with other forms of angina pectoris: Secondary | ICD-10-CM

## 2024-02-08 ENCOUNTER — Ambulatory Visit (INDEPENDENT_AMBULATORY_CARE_PROVIDER_SITE_OTHER)

## 2024-02-08 DIAGNOSIS — E538 Deficiency of other specified B group vitamins: Secondary | ICD-10-CM | POA: Diagnosis not present

## 2024-02-08 MED ORDER — CYANOCOBALAMIN 1000 MCG/ML IJ SOLN
1000.0000 ug | Freq: Once | INTRAMUSCULAR | Status: AC
Start: 2024-02-08 — End: 2024-02-08
  Administered 2024-02-08: 1000 ug via INTRAMUSCULAR

## 2024-02-08 NOTE — Progress Notes (Signed)
Pt here for monthly B12 injection per Dr. Yetta Barre  B12 given IM. and pt tolerated injection well.

## 2024-02-09 ENCOUNTER — Other Ambulatory Visit: Payer: Self-pay

## 2024-02-10 ENCOUNTER — Other Ambulatory Visit (HOSPITAL_COMMUNITY): Payer: Self-pay

## 2024-02-10 ENCOUNTER — Telehealth: Payer: Self-pay

## 2024-02-10 NOTE — Telephone Encounter (Signed)
*  [facility]  Pharmacy Patient Advocate Encounter   Received notification from CoverMyMeds that prior authorization for Nurtec 75MG  dispersible tablets  is required/requested.   Insurance verification completed.   The patient is insured through [facility] .   Per test claim: The current 30 day co-pay is, $0.00.  No PA needed at this time. This test claim was processed through [facility]- copay amounts may vary at other pharmacies due to pharmacy/plan contracts, or as the patient moves through the different stages of their insurance plan.

## 2024-02-12 ENCOUNTER — Other Ambulatory Visit: Payer: Self-pay | Admitting: Internal Medicine

## 2024-02-12 ENCOUNTER — Other Ambulatory Visit (HOSPITAL_BASED_OUTPATIENT_CLINIC_OR_DEPARTMENT_OTHER): Payer: Self-pay | Admitting: Family

## 2024-02-12 DIAGNOSIS — K2100 Gastro-esophageal reflux disease with esophagitis, without bleeding: Secondary | ICD-10-CM

## 2024-02-12 DIAGNOSIS — M792 Neuralgia and neuritis, unspecified: Secondary | ICD-10-CM

## 2024-02-12 DIAGNOSIS — M19011 Primary osteoarthritis, right shoulder: Secondary | ICD-10-CM

## 2024-02-12 DIAGNOSIS — I25118 Atherosclerotic heart disease of native coronary artery with other forms of angina pectoris: Secondary | ICD-10-CM

## 2024-02-12 DIAGNOSIS — K2270 Barrett's esophagus without dysplasia: Secondary | ICD-10-CM

## 2024-02-26 ENCOUNTER — Ambulatory Visit (INDEPENDENT_AMBULATORY_CARE_PROVIDER_SITE_OTHER): Payer: 59 | Admitting: Podiatry

## 2024-02-26 ENCOUNTER — Encounter: Payer: Self-pay | Admitting: Podiatry

## 2024-02-26 DIAGNOSIS — N183 Chronic kidney disease, stage 3 unspecified: Secondary | ICD-10-CM | POA: Diagnosis not present

## 2024-02-26 DIAGNOSIS — B351 Tinea unguium: Secondary | ICD-10-CM | POA: Diagnosis not present

## 2024-02-26 DIAGNOSIS — M79674 Pain in right toe(s): Secondary | ICD-10-CM

## 2024-02-26 DIAGNOSIS — M79675 Pain in left toe(s): Secondary | ICD-10-CM

## 2024-02-26 NOTE — Progress Notes (Signed)
This patient returns to my office for at risk foot care.  This patient requires this care by a professional since this patient will be at risk due to having chronic kidney disease  This patient is unable to cut nails himself since the patient cannot reach his nails.These nails are painful walking and wearing shoes.  This patient presents for at risk foot care today.  General Appearance  Alert, conversant and in no acute stress.  Vascular  Dorsalis pedis and posterior tibial  pulses are weakly  palpable  bilaterally.  Capillary return is within normal limits  bilaterally. Temperature is within normal limits  bilaterally.  Neurologic  Senn-Weinstein monofilament wire test within normal limits  bilaterally. Muscle power within normal limits bilaterally.  Nails Thick disfigured discolored nails with subungual debris  from hallux to fifth toes bilaterally. No evidence of bacterial infection or drainage bilaterally.  Orthopedic  No limitations of motion  feet .  No crepitus or effusions noted.  No bony pathology or digital deformities noted.  Skin  normotropic skin with no porokeratosis noted bilaterally.  No signs of infections or ulcers noted.     Onychomycosis  Pain in right toes  Pain in left toes  Consent was obtained for treatment procedures.   Mechanical debridement of nails 1-5  bilaterally performed with a nail nipper.  Filed with dremel without incident.    Return office visit   12  weeks                  Told patient to return for periodic foot care and evaluation due to potential at risk complications.   Helane Gunther DPM

## 2024-03-07 ENCOUNTER — Ambulatory Visit: Admitting: Internal Medicine

## 2024-03-10 ENCOUNTER — Ambulatory Visit

## 2024-03-10 DIAGNOSIS — E538 Deficiency of other specified B group vitamins: Secondary | ICD-10-CM

## 2024-03-10 MED ORDER — CYANOCOBALAMIN 1000 MCG/ML IJ SOLN
1000.0000 ug | Freq: Once | INTRAMUSCULAR | Status: AC
  Administered 2024-03-10: 1000 ug via INTRAMUSCULAR

## 2024-03-10 NOTE — Progress Notes (Signed)
Pt was given B12 injection with no complications.

## 2024-03-14 ENCOUNTER — Other Ambulatory Visit: Payer: Self-pay | Admitting: Internal Medicine

## 2024-03-14 DIAGNOSIS — M792 Neuralgia and neuritis, unspecified: Secondary | ICD-10-CM

## 2024-03-15 ENCOUNTER — Other Ambulatory Visit: Payer: Self-pay | Admitting: Internal Medicine

## 2024-03-15 DIAGNOSIS — M792 Neuralgia and neuritis, unspecified: Secondary | ICD-10-CM

## 2024-03-15 DIAGNOSIS — M19011 Primary osteoarthritis, right shoulder: Secondary | ICD-10-CM

## 2024-03-15 MED ORDER — TRAMADOL HCL 50 MG PO TABS: 50.0000 mg | ORAL_TABLET | Freq: Two times a day (BID) | ORAL | 0 refills | Status: DC | PRN

## 2024-03-15 NOTE — Telephone Encounter (Signed)
 Copied from CRM [id number]. Topic: Clinical - Medication Refill >> Mar 15, 2024 12:31 PM Juleen Oakland F wrote: Medication: traMADol  (ULTRAM ) 50 MG tablet [[id number]]  Has the patient contacted their pharmacy? Yes (Agent: If no, request that the patient contact the pharmacy for the refill. If patient does not wish to contact the pharmacy document the reason why and proceed with request.) (Agent: If yes, when and what did the pharmacy advise?)  This is the patient's preferred pharmacy:  [id number] Jonette Nestle, [place] - [address] AT [facility] OF [facility] & [address] [place] [place] [address] Phone: [phone] Fax: [phone]  Is this the correct pharmacy for this prescription? Yes If no, delete pharmacy and type the correct one.   Has the prescription been filled recently? Yes  Is the patient out of the medication? Yes  Has the patient been seen for an appointment in the last year OR does the patient have an upcoming appointment? Yes  Can we respond through MyChart? No  Agent: Please be advised that Rx refills may take up to 3 business days. We ask that you follow-up with your pharmacy.

## 2024-03-28 ENCOUNTER — Ambulatory Visit: Attending: Pharmacist | Admitting: Pharmacist

## 2024-03-28 NOTE — Progress Notes (Deleted)
 Patient ID: Jaime Barnes                 DOB: June 01, 1951                      MRN: [id number]      HPI: Jaime Barnes is a 73 y.o. male referred by Dr. Raford to HTN clinic. PMH is significant for PAD, HTN, HLD, non-obstructive CAD, arthritis, tobacco use.   Patient seen by Dr. Raford 02/03/24. BP was 144/76. Reported home BP in the 130's. Patient was asked to monitor BP and bring in home readings and monitor to PharmD visit.  Tobacco use Exercise   Current HTN meds: amlodipine  10mg  daily Previously tried: benazepril , hydrochlorothiazide , and lisinopril   BP goal: <130/80  Family History:   Social History:   Diet:   Exercise:   Home BP readings:   Wt Readings from Last 3 Encounters:  02/03/24 172 lb 6.4 oz (78.2 kg)  12/02/23 172 lb 3.2 oz (78.1 kg)  11/20/23 175 lb (79.4 kg)   BP Readings from Last 3 Encounters:  02/03/24 (!) 144/76  12/02/23 (!) 140/80  07/06/23 126/74   Pulse Readings from Last 3 Encounters:  02/03/24 95  12/02/23 90  07/06/23 63    Renal function: CrCl cannot be calculated (Patient's most recent lab result is older than the maximum 21 days allowed.).  Past Medical History:  Diagnosis Date   Barrett's esophagus 2011   Recommend repeat EGD for surveillance in 2 yrs-[facility]   Bipolar disorder (HCC)    Chronic kidney disease    Cirrhosis (HCC)    Diverticulosis of colon    GANGLION CYST, TENDON SHEATH 06/26/2010   Hyperlipidemia    Hypertension    Hypertriglyceridemia 01/21/2019   Impotence    Migraine    Pancreatitis    Pulmonary nodule    Pulmonary nodule seen on imaging study 02/06/2011   Followup CT in December 2014 with 20 months of stability at 4 mm.  No further imaging recommended     Current Outpatient Medications on File Prior to Visit  Medication Sig Dispense Refill   albuterol  (VENTOLIN  HFA) 108 (90 Base) MCG/ACT inhaler INHALE 1 TO 2 PUFFS INTO THE LUNGS EVERY 6 HOURS AS NEEDED FOR WHEEZING OR SHORTNESS OF BREATH 18 g 5    amLODipine  (NORVASC ) 10 MG tablet TAKE 1 TABLET(10 MG) BY MOUTH DAILY 90 tablet 1   aspirin  81 MG chewable tablet Chew 81 mg by mouth daily.     BREZTRI  AEROSPHERE 160-9-4.8 MCG/ACT AERO inhaler INHALE 2 PUFFS INTO THE LUNGS TWICE DAILY 10.7 g 5   Ferric Maltol  (ACCRUFER ) 30 MG CAPS Take 1 capsule by mouth in the morning and at bedtime. 180 capsule 1   gabapentin  (NEURONTIN ) 300 MG capsule TAKE 1 CAPSULE(300 MG) BY MOUTH TWICE DAILY 180 capsule 1   icosapent  Ethyl (VASCEPA ) 1 g capsule Take 2 capsules (2 g total) by mouth 2 (two) times daily. 360 capsule 3   magnesium  oxide (MAG-OX) 400 (240 Mg) MG tablet Take 1 tablet (400 mg total) by mouth daily. 90 tablet 0   NURTEC 75 MG TBDP TAKE 1 TABLET AS NEEDED AT THE EARLIEST ON SET OF A MIGRAINE MAX OF 1 IN 24 HOURS 8 tablet 5   omeprazole  (PRILOSEC) 20 MG capsule TAKE 1 CAPSULE(20 MG) BY MOUTH TWICE DAILY BEFORE A MEAL 180 capsule 0   polyethylene glycol (MIRALAX  / GLYCOLAX ) 17 g packet Take 17 g by mouth daily.  14 each 0   risperiDONE  (RISPERDAL ) 1 MG tablet TAKE 1 TABLET(1 MG) BY MOUTH AT BEDTIME 90 tablet 0   rosuvastatin  (CRESTOR ) 20 MG tablet Take 1 tablet (20 mg total) by mouth daily. 90 tablet 3   thiamine  (VITAMIN B1) 100 MG tablet Take 1 tablet (100 mg total) by mouth daily. 90 tablet 1   topiramate  (TOPAMAX ) 100 MG tablet TAKE 1 TABLET(100 MG) BY MOUTH TWICE DAILY 60 tablet 11   traMADol  (ULTRAM ) 50 MG tablet Take 1 tablet (50 mg total) by mouth every 12 (twelve) hours as needed. 180 tablet 0   traZODone  (DESYREL ) 100 MG tablet TAKE 2 TABLETS(200 MG) BY MOUTH AT BEDTIME 180 tablet 1   Ubrogepant  (UBRELVY ) 100 MG TABS TAKE 1 TABLET(100 MG) BY MOUTH 1 TIME EVERY 2 HOURS AS DIRECTED AS NEEDED. MAXIMUM 2 TABLETS IN 24 HOURS 16 tablet 11   valACYclovir  (VALTREX ) 500 MG tablet TAKE 1 TABLET(500 MG) BY MOUTH DAILY 90 tablet 0   Zoster Vaccine Adjuvanted (SHINGRIX ) injection Inject 0.5 mLs into the muscle once for 1 dose. 0.5 mL 1   No current  facility-administered medications on file prior to visit.    Allergies  Allergen Reactions   Other     Other reaction(s): rash, sweat, nausea Other reaction(s): rash, sweat, nausea   Codeine Phosphate Other (See Comments)    REACTION: breathing difficulty   Penicillins Rash and Other (See Comments)    Break out in sweats/ Rash amoxicillin. Denies airway involvement Has patient had a PCN reaction causing immediate rash, facial/tongue/throat swelling, SOB or lightheadedness with hypotension: Yes Has patient had a PCN reaction causing severe rash involving mucus membranes or skin necrosis: Yes Has patient had a PCN reaction that required hospitalization No Has patient had a PCN reaction occurring within the last 10 years: No If all of the above answers are NO, then may proceed with Cephalosporin u    There were no vitals taken for this visit.   Assessment/Plan:  No BP recorded.  {Refresh Note OR Click here to enter BP  :1}***   1. Hypertension -   No problem-specific Assessment & Plan notes found for this encounter.  @MTPCOMPLETEDLIST @   Thank you  Keval Nam D Minda Faas, Pharm.D, BCPS, CPP [facility] [address] [place], [place] [address]  Phone: [phone]; Fax: [phone]

## 2024-04-06 ENCOUNTER — Other Ambulatory Visit: Payer: Self-pay | Admitting: Cardiovascular Disease

## 2024-04-08 ENCOUNTER — Other Ambulatory Visit: Payer: Self-pay | Admitting: Internal Medicine

## 2024-04-08 DIAGNOSIS — B009 Herpesviral infection, unspecified: Secondary | ICD-10-CM

## 2024-04-11 ENCOUNTER — Ambulatory Visit

## 2024-04-13 ENCOUNTER — Ambulatory Visit (INDEPENDENT_AMBULATORY_CARE_PROVIDER_SITE_OTHER)

## 2024-04-13 DIAGNOSIS — E538 Deficiency of other specified B group vitamins: Secondary | ICD-10-CM

## 2024-04-13 MED ORDER — CYANOCOBALAMIN 1000 MCG/ML IJ SOLN
1000.0000 ug | Freq: Once | INTRAMUSCULAR | Status: AC
  Administered 2024-04-13: 1000 ug via INTRAMUSCULAR

## 2024-04-13 NOTE — Progress Notes (Signed)
 After obtaining consent, and per orders of Dr. Yetta Barre, injection of B12 given by Ferdie Ping. Patient instructed to report any adverse reaction to me immediately.

## 2024-05-06 ENCOUNTER — Other Ambulatory Visit: Payer: Self-pay | Admitting: Internal Medicine

## 2024-05-06 DIAGNOSIS — F5104 Psychophysiologic insomnia: Secondary | ICD-10-CM

## 2024-05-16 ENCOUNTER — Ambulatory Visit (INDEPENDENT_AMBULATORY_CARE_PROVIDER_SITE_OTHER)

## 2024-05-16 DIAGNOSIS — E538 Deficiency of other specified B group vitamins: Secondary | ICD-10-CM

## 2024-05-16 MED ORDER — CYANOCOBALAMIN 1000 MCG/ML IJ SOLN
1000.0000 ug | Freq: Once | INTRAMUSCULAR | Status: AC
  Administered 2024-05-16: 1000 ug via INTRAMUSCULAR

## 2024-05-16 NOTE — Progress Notes (Signed)
 Pt here for monthly B12 injection per Dr. Joshua  B12 1000mcg given IM and pt tolerated injection well.

## 2024-05-16 NOTE — Progress Notes (Deleted)
 NEUROLOGY FOLLOW UP OFFICE NOTE  Jaime Barnes [id number]  Assessment/Plan:   Migraine without aura, without status migrainosus, not intractable Left parietal headache    Migraine prevention:  topiramate  100mg  twice daily.  Monitor other headache *** 2.  Migraine rescue:  Nurtec *** 3.  Limit use of pain relievers to no more than 9 days out of the month to prevent risk of rebound or medication-overuse headache. 4.  Keep headache diary 5.  Follow up ***  Subjective:  Jaime Barnes is a 73 year old right-handed man with hypertension, bipolar disorder, CKD, tobacco use, Barrett's esophagus and cirrhosis who follows up for migraine.   UPDATE: Migraine: Intensity:  10/10 Duration:  Within 30 minutes with Nurtec Frequency:  2-3 a month   Left parietal headache: ***  Current medications: Current NSAIDS:  none Current analgesics:  none Current triptans: none Current anti-emetic:  none Current muscle relaxants:  none Current Antihypertensive medications:  Lisinopril  10mg , verapamil  120mg  three times daily, metoprolol  Current Antidepressant medications:  none Current Anticonvulsant medications: topiramate  100mg  twice daily, gabapentin  300mg  twice daily Current anti-CGRP:  Nurtec 75mg  PRN Current vitamins/supplements:  magnesium  oxide 400mg  Other medications:  Risperidone , trazodone      HISTORY: Migraine: Onset:  Has remote history of migraines but they returned in 2016 after experiencing increased stress related to his ex-girlfriend. Location:  Right sided Quality:  pounding Initial Intensity:  50/10 Aura:  no Prodrome:  no Associated symptoms: Photophobia, phonophobia.  No nausea or visual disturbance Initial Duration:  Until takes medication Initial Frequency:  every other day Triggers:  Emotional stress Relieving factors: None Activity:  Cannot function  Left parietal headache: In 2023, he started getting a squeezing/throbbing pain in his left parietal region  last 3-4 minutes.  Squeezing and rubbing head over it and takes a tizanidine  helps ease it. They occur once a week.  CTA head on 01/07/2022 personally reviewed revealed mild atherosclerotic plaque in the intercranial ICAs but no significant stenosis or aneurysms.  Those headaches resolved.  Then he began experiencing a new left parietal headache in December 2024, this time described as sharp. Occurs every 1 to 3 weeks.  3-4 minutes with Nurtec.      Past medication: Past abortive medication:  ibuprofen , naproxen , Tylenol , Excedrin, sumatriptan  (effective but contraindicated due to cardiac history), Ubrelvy  100mg  (effective, not formulary) Past preventative medication:  none Other past therapy:  none   CT of head from 07/19/15 was unremarkable.    MRI of brain from 12/04/14 showed global atrophy and mild small vessel ischemic changes. 06/23/16:  Sed rate 25  PAST MEDICAL HISTORY: Past Medical History:  Diagnosis Date   Barrett's esophagus 2011   Recommend repeat EGD for surveillance in 2 yrs-[facility]   Bipolar disorder (HCC)    Chronic kidney disease    Cirrhosis (HCC)    Diverticulosis of colon    GANGLION CYST, TENDON SHEATH 06/26/2010   Hyperlipidemia    Hypertension    Hypertriglyceridemia 01/21/2019   Impotence    Migraine    Pancreatitis    Pulmonary nodule    Pulmonary nodule seen on imaging study 02/06/2011   Followup CT in December 2014 with 20 months of stability at 4 mm.  No further imaging recommended     MEDICATIONS: Current Outpatient Medications on File Prior to Visit  Medication Sig Dispense Refill   albuterol  (VENTOLIN  HFA) 108 (90 Base) MCG/ACT inhaler INHALE 1 TO 2 PUFFS INTO THE LUNGS EVERY 6 HOURS AS NEEDED FOR WHEEZING OR SHORTNESS OF  BREATH 18 g 5   amLODipine  (NORVASC ) 10 MG tablet TAKE 1 TABLET(10 MG) BY MOUTH DAILY 90 tablet 3   aspirin  81 MG chewable tablet Chew 81 mg by mouth daily.     BREZTRI  AEROSPHERE 160-9-4.8 MCG/ACT AERO inhaler INHALE 2 PUFFS INTO THE  LUNGS TWICE DAILY 10.7 g 5   Ferric Maltol  (ACCRUFER ) 30 MG CAPS Take 1 capsule by mouth in the morning and at bedtime. 180 capsule 1   gabapentin  (NEURONTIN ) 300 MG capsule TAKE 1 CAPSULE(300 MG) BY MOUTH TWICE DAILY 180 capsule 1   icosapent  Ethyl (VASCEPA ) 1 g capsule Take 2 capsules (2 g total) by mouth 2 (two) times daily. 360 capsule 3   magnesium  oxide (MAG-OX) 400 (240 Mg) MG tablet Take 1 tablet (400 mg total) by mouth daily. 90 tablet 0   NURTEC 75 MG TBDP TAKE 1 TABLET AS NEEDED AT THE EARLIEST ON SET OF A MIGRAINE MAX OF 1 IN 24 HOURS 8 tablet 5   omeprazole  (PRILOSEC) 20 MG capsule TAKE 1 CAPSULE(20 MG) BY MOUTH TWICE DAILY BEFORE A MEAL 180 capsule 0   polyethylene glycol (MIRALAX  / GLYCOLAX ) 17 g packet Take 17 g by mouth daily. 14 each 0   risperiDONE  (RISPERDAL ) 1 MG tablet TAKE 1 TABLET(1 MG) BY MOUTH AT BEDTIME 90 tablet 0   rosuvastatin  (CRESTOR ) 20 MG tablet Take 1 tablet (20 mg total) by mouth daily. 90 tablet 3   thiamine  (VITAMIN B1) 100 MG tablet Take 1 tablet (100 mg total) by mouth daily. 90 tablet 1   topiramate  (TOPAMAX ) 100 MG tablet TAKE 1 TABLET(100 MG) BY MOUTH TWICE DAILY 60 tablet 11   traMADol  (ULTRAM ) 50 MG tablet Take 1 tablet (50 mg total) by mouth every 12 (twelve) hours as needed. 180 tablet 0   traZODone  (DESYREL ) 100 MG tablet TAKE 2 TABLETS(200 MG) BY MOUTH AT BEDTIME. NEEDS APPOINTMENT FOR REFILLS 60 tablet 0   Ubrogepant  (UBRELVY ) 100 MG TABS TAKE 1 TABLET(100 MG) BY MOUTH 1 TIME EVERY 2 HOURS AS DIRECTED AS NEEDED. MAXIMUM 2 TABLETS IN 24 HOURS 16 tablet 11   valACYclovir  (VALTREX ) 500 MG tablet TAKE 1 TABLET(500 MG) BY MOUTH DAILY 90 tablet 0   Zoster Vaccine Adjuvanted (SHINGRIX ) injection Inject 0.5 mLs into the muscle once for 1 dose. 0.5 mL 1   No current facility-administered medications on file prior to visit.    ALLERGIES: Allergies  Allergen Reactions   Other     Other reaction(s): rash, sweat, nausea Other reaction(s): rash, sweat,  nausea   Codeine Phosphate Other (See Comments)    REACTION: breathing difficulty   Penicillins Rash and Other (See Comments)    Break out in sweats/ Rash amoxicillin. Denies airway involvement Has patient had a PCN reaction causing immediate rash, facial/tongue/throat swelling, SOB or lightheadedness with hypotension: Yes Has patient had a PCN reaction causing severe rash involving mucus membranes or skin necrosis: Yes Has patient had a PCN reaction that required hospitalization No Has patient had a PCN reaction occurring within the last 10 years: No If all of the above answers are NO, then may proceed with Cephalosporin u    FAMILY HISTORY: Family History  Problem Relation Age of Onset   Anemia Mother    Hypertension Daughter    Diabetes Daughter    Migraines Daughter       Objective:  *** General: No acute distress.  Patient appears ***-groomed.   Head:  Normocephalic/atraumatic Eyes:  Fundi examined but not visualized Neck:  supple, no paraspinal tenderness, full range of motion Heart:  Regular rate and rhythm Neurological Exam: alert and oriented.  Speech fluent and not dysarthric, language intact.  CN II-XII intact. Bulk and tone normal, muscle strength 5/5 throughout.  Sensation to light touch intact.  Deep tendon reflexes 2+ throughout, toes downgoing.  Finger to nose testing intact.  Gait normal, Romberg negative.   Juliene Dunnings, DO  CC: ***

## 2024-05-17 ENCOUNTER — Ambulatory Visit: Payer: 59 | Admitting: Neurology

## 2024-05-17 ENCOUNTER — Encounter: Payer: Self-pay | Admitting: Neurology

## 2024-05-27 ENCOUNTER — Ambulatory Visit: Admitting: Podiatry

## 2024-06-05 ENCOUNTER — Other Ambulatory Visit: Payer: Self-pay | Admitting: Internal Medicine

## 2024-06-05 DIAGNOSIS — F5104 Psychophysiologic insomnia: Secondary | ICD-10-CM

## 2024-06-06 ENCOUNTER — Other Ambulatory Visit: Payer: Self-pay | Admitting: Gastroenterology

## 2024-06-06 ENCOUNTER — Ambulatory Visit: Admitting: Neurology

## 2024-06-06 DIAGNOSIS — K7030 Alcoholic cirrhosis of liver without ascites: Secondary | ICD-10-CM

## 2024-06-07 ENCOUNTER — Encounter: Payer: Self-pay | Admitting: Internal Medicine

## 2024-06-07 ENCOUNTER — Ambulatory Visit (INDEPENDENT_AMBULATORY_CARE_PROVIDER_SITE_OTHER): Admitting: Internal Medicine

## 2024-06-07 VITALS — BP 148/82 | HR 80 | Temp 98.1°F | Resp 16 | Ht 66.0 in | Wt 163.6 lb

## 2024-06-07 DIAGNOSIS — D510 Vitamin B12 deficiency anemia due to intrinsic factor deficiency: Secondary | ICD-10-CM

## 2024-06-07 DIAGNOSIS — E785 Hyperlipidemia, unspecified: Secondary | ICD-10-CM | POA: Diagnosis not present

## 2024-06-07 DIAGNOSIS — Z23 Encounter for immunization: Secondary | ICD-10-CM | POA: Diagnosis not present

## 2024-06-07 DIAGNOSIS — D508 Other iron deficiency anemias: Secondary | ICD-10-CM | POA: Diagnosis not present

## 2024-06-07 DIAGNOSIS — Z0001 Encounter for general adult medical examination with abnormal findings: Secondary | ICD-10-CM

## 2024-06-07 DIAGNOSIS — Z125 Encounter for screening for malignant neoplasm of prostate: Secondary | ICD-10-CM

## 2024-06-07 DIAGNOSIS — M19011 Primary osteoarthritis, right shoulder: Secondary | ICD-10-CM | POA: Diagnosis not present

## 2024-06-07 DIAGNOSIS — R600 Localized edema: Secondary | ICD-10-CM

## 2024-06-07 DIAGNOSIS — I10 Essential (primary) hypertension: Secondary | ICD-10-CM

## 2024-06-07 DIAGNOSIS — M792 Neuralgia and neuritis, unspecified: Secondary | ICD-10-CM

## 2024-06-07 DIAGNOSIS — Z1211 Encounter for screening for malignant neoplasm of colon: Secondary | ICD-10-CM | POA: Insufficient documentation

## 2024-06-07 DIAGNOSIS — N1831 Chronic kidney disease, stage 3a: Secondary | ICD-10-CM | POA: Insufficient documentation

## 2024-06-07 DIAGNOSIS — I739 Peripheral vascular disease, unspecified: Secondary | ICD-10-CM | POA: Diagnosis not present

## 2024-06-07 DIAGNOSIS — Z0000 Encounter for general adult medical examination without abnormal findings: Secondary | ICD-10-CM | POA: Diagnosis not present

## 2024-06-07 DIAGNOSIS — R7989 Other specified abnormal findings of blood chemistry: Secondary | ICD-10-CM

## 2024-06-07 DIAGNOSIS — M1120 Other chondrocalcinosis, unspecified site: Secondary | ICD-10-CM

## 2024-06-07 LAB — CBC WITH DIFFERENTIAL/PLATELET
Basophils Absolute: 0.1 K/uL (ref 0.0–0.1)
Basophils Relative: 0.8 % (ref 0.0–3.0)
Eosinophils Absolute: 0.1 K/uL (ref 0.0–0.7)
Eosinophils Relative: 1.2 % (ref 0.0–5.0)
HCT: 40.7 % (ref 39.0–52.0)
Hemoglobin: 13.8 g/dL (ref 13.0–17.0)
Lymphocytes Relative: 26 % (ref 12.0–46.0)
Lymphs Abs: 2.6 K/uL (ref 0.7–4.0)
MCHC: 33.9 g/dL (ref 30.0–36.0)
MCV: 95.4 fl (ref 78.0–100.0)
Monocytes Absolute: 1 K/uL (ref 0.1–1.0)
Monocytes Relative: 10 % (ref 3.0–12.0)
Neutro Abs: 6.3 K/uL (ref 1.4–7.7)
Neutrophils Relative %: 62 % (ref 43.0–77.0)
Platelets: 291 K/uL (ref 150.0–400.0)
RBC: 4.27 Mil/uL (ref 4.22–5.81)
RDW: 14 % (ref 11.5–15.5)
WBC: 10.2 K/uL (ref 4.0–10.5)

## 2024-06-07 LAB — BASIC METABOLIC PANEL WITH GFR
BUN: 10 mg/dL (ref 6–23)
CO2: 25 meq/L (ref 19–32)
Calcium: 9.6 mg/dL (ref 8.4–10.5)
Chloride: 108 meq/L (ref 96–112)
Creatinine, Ser: 1.26 mg/dL (ref 0.40–1.50)
GFR: 56.43 mL/min — ABNORMAL LOW (ref >60.00)
Glucose, Bld: 75 mg/dL (ref 70–99)
Potassium: 4 meq/L (ref 3.5–5.1)
Sodium: 141 meq/L (ref 135–145)

## 2024-06-07 LAB — IBC + FERRITIN
Ferritin: 72.2 ng/mL (ref 22.0–322.0)
Iron: 61 ug/dL (ref 42–165)
Saturation Ratios: 19.8 % — ABNORMAL LOW (ref 20.0–50.0)
TIBC: 308 ug/dL (ref 250.0–450.0)
Transferrin: 220 mg/dL (ref 212.0–360.0)

## 2024-06-07 LAB — PSA: PSA: 1.34 ng/mL (ref 0.10–4.00)

## 2024-06-07 LAB — FOLATE: Folate: 7.2 ng/mL (ref >5.9)

## 2024-06-07 LAB — CK: Total CK: 106 U/L (ref 17–232)

## 2024-06-07 LAB — VITAMIN B12: Vitamin B-12: 501 pg/mL (ref 211–911)

## 2024-06-07 LAB — D-DIMER, QUANTITATIVE: D-Dimer, Quant: 0.5 ug{FEU}/mL — ABNORMAL HIGH (ref <0.50)

## 2024-06-07 MED ORDER — TRAMADOL HCL 50 MG PO TABS: 50.0000 mg | ORAL_TABLET | Freq: Two times a day (BID) | ORAL | 0 refills | Status: DC | PRN

## 2024-06-07 NOTE — Patient Instructions (Signed)
 Health Maintenance, Male  Adopting a healthy lifestyle and getting preventive care are important in promoting health and wellness. Ask your health care provider about:  The right schedule for you to have regular tests and exams.  Things you can do on your own to prevent diseases and keep yourself healthy.  What should I know about diet, weight, and exercise?  Eat a healthy diet    Eat a diet that includes plenty of vegetables, fruits, low-fat dairy products, and lean protein.  Do not eat a lot of foods that are high in solid fats, added sugars, or sodium.  Maintain a healthy weight  Body mass index (BMI) is a measurement that can be used to identify possible weight problems. It estimates body fat based on height and weight. Your health care provider can help determine your BMI and help you achieve or maintain a healthy weight.  Get regular exercise  Get regular exercise. This is one of the most important things you can do for your health. Most adults should:  Exercise for at least 150 minutes each week. The exercise should increase your heart rate and make you sweat (moderate-intensity exercise).  Do strengthening exercises at least twice a week. This is in addition to the moderate-intensity exercise.  Spend less time sitting. Even light physical activity can be beneficial.  Watch cholesterol and blood lipids  Have your blood tested for lipids and cholesterol at 74 years of age, then have this test every 5 years.  You may need to have your cholesterol levels checked more often if:  Your lipid or cholesterol levels are high.  You are older than 74 years of age.  You are at high risk for heart disease.  What should I know about cancer screening?  Many types of cancers can be detected early and may often be prevented. Depending on your health history and family history, you may need to have cancer screening at various ages. This may include screening for:  Colorectal cancer.  Prostate cancer.  Skin cancer.  Lung  cancer.  What should I know about heart disease, diabetes, and high blood pressure?  Blood pressure and heart disease  High blood pressure causes heart disease and increases the risk of stroke. This is more likely to develop in people who have high blood pressure readings or are overweight.  Talk with your health care provider about your target blood pressure readings.  Have your blood pressure checked:  Every 3-5 years if you are 24-52 years of age.  Every year if you are 3 years old or older.  If you are between the ages of 60 and 72 and are a current or former smoker, ask your health care provider if you should have a one-time screening for abdominal aortic aneurysm (AAA).  Diabetes  Have regular diabetes screenings. This checks your fasting blood sugar level. Have the screening done:  Once every three years after age 66 if you are at a normal weight and have a low risk for diabetes.  More often and at a younger age if you are overweight or have a high risk for diabetes.  What should I know about preventing infection?  Hepatitis B  If you have a higher risk for hepatitis B, you should be screened for this virus. Talk with your health care provider to find out if you are at risk for hepatitis B infection.  Hepatitis C  Blood testing is recommended for:  Everyone born from [birth year] through 1966.  Anyone  with known risk factors for hepatitis C.  Sexually transmitted infections (STIs)  You should be screened each year for STIs, including gonorrhea and chlamydia, if:  You are sexually active and are younger than 74 years of age.  You are older than 74 years of age and your health care provider tells you that you are at risk for this type of infection.  Your sexual activity has changed since you were last screened, and you are at increased risk for chlamydia or gonorrhea. Ask your health care provider if you are at risk.  Ask your health care provider about whether you are at high risk for HIV. Your health care provider  may recommend a prescription medicine to help prevent HIV infection. If you choose to take medicine to prevent HIV, you should first get tested for HIV. You should then be tested every 3 months for as long as you are taking the medicine.  Follow these instructions at home:  Alcohol use  Do not drink alcohol if your health care provider tells you not to drink.  If you drink alcohol:  Limit how much you have to 0-2 drinks a day.  Know how much alcohol is in your drink. In the [place] one drink equals one 12 oz bottle of beer (355 mL), one 5 oz glass of wine (148 mL), or one 1 oz glass of hard liquor (44 mL).  Lifestyle  Do not use any products that contain nicotine or tobacco. These products include cigarettes, chewing tobacco, and vaping devices, such as e-cigarettes. If you need help quitting, ask your health care provider.  Do not use street drugs.  Do not share needles.  Ask your health care provider for help if you need support or information about quitting drugs.  General instructions  Schedule regular health, dental, and eye exams.  Stay current with your vaccines.  Tell your health care provider if:  You often feel depressed.  You have ever been abused or do not feel safe at home.  Summary  Adopting a healthy lifestyle and getting preventive care are important in promoting health and wellness.  Follow your health care provider's instructions about healthy diet, exercising, and getting tested or screened for diseases.  Follow your health care provider's instructions on monitoring your cholesterol and blood pressure.  This information is not intended to replace advice given to you by your health care provider. Make sure you discuss any questions you have with your health care provider.  Document Revised: 02/04/2021 Document Reviewed: 02/04/2021  Elsevier Patient Education  2024 [employer].

## 2024-06-07 NOTE — Progress Notes (Unsigned)
 Subjective:  Patient ID: Jaime Barnes, male    DOB: 02/23/1951  Age: 73 y.o. MRN: [id number]  CC: Foot Swelling (Left foot, 2-3 months. No pain, patient states when he puts on his compression socks then his foot doesn't swell. ) and Annual Exam   HPI Jaime Barnes presents for a CPX and f/up --  Discussed the use of AI scribe software for clinical note transcription with the patient, who gave verbal consent to proceed.  History of Present Illness Jaime Barnes is a 73 year old male who presents with left leg pain and swelling.  He experiences intermittent swelling and pain in his left leg, particularly around the ankle, which worsens with walking. Knee-high socks help reduce the swelling, while shorter socks do not. Although he denies current swelling, he notes that it is recurrent.  He experiences occasional dizziness and lightheadedness. No chest pain or shortness of breath.  He smokes two cigars a day.     Outpatient Medications Prior to Visit  Medication Sig Dispense Refill   albuterol  (VENTOLIN  HFA) 108 (90 Base) MCG/ACT inhaler INHALE 1 TO 2 PUFFS INTO THE LUNGS EVERY 6 HOURS AS NEEDED FOR WHEEZING OR SHORTNESS OF BREATH 18 g 5   amLODipine  (NORVASC ) 10 MG tablet TAKE 1 TABLET(10 MG) BY MOUTH DAILY 90 tablet 3   aspirin  81 MG chewable tablet Chew 81 mg by mouth daily.     BREZTRI  AEROSPHERE 160-9-4.8 MCG/ACT AERO inhaler INHALE 2 PUFFS INTO THE LUNGS TWICE DAILY 10.7 g 5   Ferric Maltol  (ACCRUFER ) 30 MG CAPS Take 1 capsule by mouth in the morning and at bedtime. 180 capsule 1   gabapentin  (NEURONTIN ) 300 MG capsule TAKE 1 CAPSULE(300 MG) BY MOUTH TWICE DAILY 180 capsule 1   icosapent  Ethyl (VASCEPA ) 1 g capsule Take 2 capsules (2 g total) by mouth 2 (two) times daily. 360 capsule 3   magnesium  oxide (MAG-OX) 400 (240 Mg) MG tablet Take 1 tablet (400 mg total) by mouth daily. 90 tablet 0   NURTEC 75 MG TBDP TAKE 1 TABLET AS NEEDED AT THE EARLIEST ON SET OF A MIGRAINE MAX OF 1 IN  24 HOURS 8 tablet 5   omeprazole  (PRILOSEC) 20 MG capsule TAKE 1 CAPSULE(20 MG) BY MOUTH TWICE DAILY BEFORE A MEAL 180 capsule 0   polyethylene glycol (MIRALAX  / GLYCOLAX ) 17 g packet Take 17 g by mouth daily. 14 each 0   risperiDONE  (RISPERDAL ) 1 MG tablet TAKE 1 TABLET(1 MG) BY MOUTH AT BEDTIME 90 tablet 0   rosuvastatin  (CRESTOR ) 20 MG tablet Take 1 tablet (20 mg total) by mouth daily. 90 tablet 3   thiamine  (VITAMIN B1) 100 MG tablet Take 1 tablet (100 mg total) by mouth daily. 90 tablet 1   topiramate  (TOPAMAX ) 100 MG tablet TAKE 1 TABLET(100 MG) BY MOUTH TWICE DAILY 60 tablet 11   Ubrogepant  (UBRELVY ) 100 MG TABS TAKE 1 TABLET(100 MG) BY MOUTH 1 TIME EVERY 2 HOURS AS DIRECTED AS NEEDED. MAXIMUM 2 TABLETS IN 24 HOURS 16 tablet 11   valACYclovir  (VALTREX ) 500 MG tablet TAKE 1 TABLET(500 MG) BY MOUTH DAILY 90 tablet 0   traMADol  (ULTRAM ) 50 MG tablet Take 1 tablet (50 mg total) by mouth every 12 (twelve) hours as needed. 180 tablet 0   traZODone  (DESYREL ) 100 MG tablet TAKE 2 TABLETS(200 MG) BY MOUTH AT BEDTIME. NEEDS APPOINTMENT FOR REFILLS 60 tablet 0   Zoster Vaccine Adjuvanted (SHINGRIX ) injection Inject 0.5 mLs into the muscle once for 1 dose.  0.5 mL 1   No facility-administered medications prior to visit.    ROS Review of Systems  Objective:  BP (!) 148/82 (BP Location: Left Arm, Patient Position: Sitting, Cuff Size: Normal)   Pulse 80   Temp 98.1 F (36.7 C) (Oral)   Resp 16   Ht 5' 6 (1.676 m)   Wt 163 lb 9.6 oz (74.2 kg)   SpO2 97%   BMI 26.41 kg/m   BP Readings from Last 3 Encounters:  06/07/24 (!) 148/82  02/03/24 (!) 144/76  12/02/23 (!) 140/80    Wt Readings from Last 3 Encounters:  06/07/24 163 lb 9.6 oz (74.2 kg)  02/03/24 172 lb 6.4 oz (78.2 kg)  12/02/23 172 lb 3.2 oz (78.1 kg)    Physical Exam Vitals reviewed.  Constitutional:      Appearance: Normal appearance.  HENT:     Mouth/Throat:     Mouth: Mucous membranes are moist.  Eyes:     General:  No scleral icterus.    Conjunctiva/sclera: Conjunctivae normal.  Cardiovascular:     Rate and Rhythm: Normal rate and regular rhythm.     Pulses:          Dorsalis pedis pulses are 1+ on the right side and 1+ on the left side.       Posterior tibial pulses are 1+ on the right side and 1+ on the left side.     Heart sounds: Normal heart sounds, S1 normal and S2 normal. No murmur heard.    No friction rub. No gallop.     Comments: Trace LLE pitting edema  EKG --- NSR with SA, 65 bpm No LVH, Q waves, or ST/T wave changes  Pulmonary:     Breath sounds: No stridor. No wheezing, rhonchi or rales.  Abdominal:     General: Abdomen is flat.     Palpations: There is no mass.     Tenderness: There is no abdominal tenderness. There is no guarding.     Hernia: No hernia is present.  Musculoskeletal:     Right lower leg: No edema.     Left lower leg: Edema present.  Feet:     Right foot:     Skin integrity: Skin integrity normal.     Toenail Condition: Right toenails are abnormally thick. Fungal disease present.    Left foot:     Skin integrity: Skin integrity normal.     Toenail Condition: Left toenails are abnormally thick. Fungal disease present.    Comments: The foot is not swollen or tender Skin:    General: Skin is warm and dry.     Findings: Rash present.  Neurological:     General: No focal deficit present.     Mental Status: Mental status is at baseline.  Psychiatric:        Mood and Affect: Mood normal.        Behavior: Behavior normal.     Lab Results  Component Value Date   WBC 10.2 06/07/2024   HGB 13.8 06/07/2024   HCT 40.7 06/07/2024   PLT 291.0 06/07/2024   GLUCOSE 75 06/07/2024   CHOL 104 12/02/2023   TRIG 135.0 12/02/2023   HDL 40.30 12/02/2023   LDLDIRECT 156 (H) 06/09/2011   LDLCALC 37 12/02/2023   ALT 11 12/02/2023   AST 12 12/02/2023   NA 141 06/07/2024   K 4.0 06/07/2024   CL 108 06/07/2024   CREATININE 1.26 06/07/2024   BUN 10 06/07/2024  CO2  25 06/07/2024   TSH 3.60 12/02/2023   PSA 1.34 06/07/2024   INR 1.2 (H) 12/02/2023   HGBA1C 5.8 (H) 12/03/2020    US  Abdomen Complete Result Date: 12/23/2023 CLINICAL DATA:  Alcoholic cirrhosis EXAM: ABDOMEN ULTRASOUND COMPLETE COMPARISON:  June 23, 2023 FINDINGS: Gallbladder: No gallstones or wall thickening visualized. No sonographic Murphy sign noted by sonographer. Common bile duct: Diameter: 4 mm. Liver: No focal lesion. Nodular contour with increased echotexture. Portal vein is patent on color Doppler imaging with normal direction of blood flow towards the liver. IVC: No abnormality visualized. Pancreas: Visualized portion unremarkable. Spleen: Size and appearance within normal limits. Right Kidney: Length: 10.6 cm. Echogenicity within normal limits. No mass or hydronephrosis visualized. Left Kidney: Length: 10.5 cm. Echogenicity within normal limits. No hydronephrosis visualized. 1.1 x 1.3 x 2 cm simple cyst in the upper pole left kidney. No follow-up is recommended. Abdominal aorta: No aneurysm visualized. Other findings: None. IMPRESSION: 1. No acute abnormality identified. 2. Cirrhotic liver. No focal liver lesion identified. Electronically Signed   By: Craig Farr M.D.   On: 12/23/2023 10:52    Assessment & Plan:  Need for immunization against influenza -     Flu vaccine HIGH DOSE PF(Fluzone Trivalent)  Leg edema, left -     D-dimer, quantitative; Future -     US  Venous Img Lower Unilateral Left (DVT); Future  Encounter for general adult medical examination with abnormal findings  PAD (peripheral artery disease) (HCC)  Hypertension, unspecified type -     EKG 12-Lead -     Basic metabolic panel with GFR; Future -     CBC with Differential/Platelet; Future  Vitamin B12 deficiency anemia due to intrinsic factor deficiency -     Vitamin B12; Future -     Folate; Future  Iron deficiency anemia secondary to inadequate dietary iron intake -     IBC + Ferritin;  Future  Calcium  pyrophosphate deposition disease (CPPD)  Neuropathic pain -     traMADol  HCl; Take 1 tablet (50 mg total) by mouth every 12 (twelve) hours as needed.  Dispense: 180 tablet; Refill: 0  Screening for prostate cancer -     PSA; Future  Hyperlipidemia LDL goal <70 -     CK; Future  Primary osteoarthritis of right shoulder -     traMADol  HCl; Take 1 tablet (50 mg total) by mouth every 12 (twelve) hours as needed.  Dispense: 180 tablet; Refill: 0  D-dimer, elevated -     US  Venous Img Lower Unilateral Left (DVT); Future  Stage 3a chronic kidney disease (HCC)     Follow-up: Return in about 3 months (around 09/06/2024).  Debby Molt, MD

## 2024-06-08 ENCOUNTER — Inpatient Hospital Stay: Admission: RE | Admit: 2024-06-08 | Source: Ambulatory Visit

## 2024-06-08 ENCOUNTER — Ambulatory Visit
Admission: RE | Admit: 2024-06-08 | Discharge: 2024-06-08 | Disposition: A | Discharge status: Home / Self Care | Source: Ambulatory Visit | Attending: Internal Medicine | Admitting: Internal Medicine

## 2024-06-08 ENCOUNTER — Inpatient Hospital Stay
Admission: RE | Admit: 2024-06-08 | Discharge: 2024-06-08 | Discharge status: Home / Self Care | Source: Ambulatory Visit | Attending: Gastroenterology | Admitting: Gastroenterology

## 2024-06-08 ENCOUNTER — Ambulatory Visit: Payer: Self-pay | Admitting: Internal Medicine

## 2024-06-08 DIAGNOSIS — R600 Localized edema: Secondary | ICD-10-CM

## 2024-06-08 DIAGNOSIS — R791 Abnormal coagulation profile: Secondary | ICD-10-CM | POA: Diagnosis not present

## 2024-06-08 DIAGNOSIS — K7030 Alcoholic cirrhosis of liver without ascites: Secondary | ICD-10-CM

## 2024-06-08 DIAGNOSIS — R7989 Other specified abnormal findings of blood chemistry: Secondary | ICD-10-CM

## 2024-06-16 ENCOUNTER — Ambulatory Visit (INDEPENDENT_AMBULATORY_CARE_PROVIDER_SITE_OTHER): Admitting: Radiology

## 2024-06-16 DIAGNOSIS — E538 Deficiency of other specified B group vitamins: Secondary | ICD-10-CM

## 2024-06-16 MED ORDER — CYANOCOBALAMIN 1000 MCG/ML IJ SOLN
1000.0000 ug | Freq: Once | INTRAMUSCULAR | Status: AC
  Administered 2024-06-16: 1000 ug via INTRAMUSCULAR

## 2024-06-16 NOTE — Progress Notes (Signed)
Patient here for monthly B12 injection. Patient tolerated well with no complications.

## 2024-06-17 ENCOUNTER — Other Ambulatory Visit: Payer: Self-pay | Admitting: Internal Medicine

## 2024-06-17 DIAGNOSIS — K2270 Barrett's esophagus without dysplasia: Secondary | ICD-10-CM

## 2024-06-17 DIAGNOSIS — B009 Herpesviral infection, unspecified: Secondary | ICD-10-CM

## 2024-06-17 DIAGNOSIS — K2100 Gastro-esophageal reflux disease with esophagitis, without bleeding: Secondary | ICD-10-CM

## 2024-06-27 ENCOUNTER — Other Ambulatory Visit: Payer: Self-pay | Admitting: Internal Medicine

## 2024-06-27 DIAGNOSIS — F319 Bipolar disorder, unspecified: Secondary | ICD-10-CM

## 2024-06-30 NOTE — Progress Notes (Deleted)
 NEUROLOGY FOLLOW UP OFFICE NOTE  Westin Knotts [id number]  Assessment/Plan:   Migraine without aura, without status migrainosus, not intractable Left parietal headache   Migraine prevention:  Topiramate  100mg  twice daily Migraine rescue:  Nurtec PRN Lifestyle modification: Limit use of pain relievers to no more than 9 days out of the month to prevent risk of rebound or medication-overuse headache. Diet modification/hydration/caffeine cessation Routine exercise Sleep hygiene Consider vitamins/supplements:  magnesium  citrate 400mg  daily, riboflavin 400mg  daily, CoQ10 100mg  three times daily Keep headache diary Follow up ***   Subjective:  Jaime Barnes is a 73 year old right-handed man with hypertension, bipolar disorder, CKD, tobacco use, Barrett's esophagus and cirrhosis who follows up for migraine.   UPDATE: ***  Intensity:  10/10 Duration:  Within 30 minutes with Nurtec Frequency:  2-3 a month   Started getting the sharp left parietal headaches a couple of months ago.  Occurs every 1 to 3 weeks.  3-4 minutes with Nurtec.      Current NSAIDS:  none Current analgesics:  none Current triptans: none Current anti-emetic:  none Current muscle relaxants:  none Current Antihypertensive medications:  Lisinopril  10mg , verapamil  120mg  three times daily, metoprolol  Current Antidepressant medications:  none Current Anticonvulsant medications: topiramate  100mg  twice daily, gabapentin  300mg  twice daily Current anti-CGRP:  Nurtec 75mg  PRN Current vitamins/supplements:  magnesium  oxide 400mg  Other medications:  Risperidone , trazodone      HISTORY: Onset:  Has remote history of migraines but they returned in 2016 after experiencing increased stress related to his ex-girlfriend. Location:  Right sided Quality:  pounding Initial Intensity:  50/10 Aura:  no Prodrome:  no Associated symptoms: Photophobia, phonophobia.  No nausea or visual disturbance Initial Duration:  Until  takes medication Initial Frequency:  every other day Triggers:  Emotional stress Relieving factors: None Activity:  Cannot function  In 2023, he started getting a squeezing/throbbing pain in his left parietal region last 3-4 minutes.  Squeezing and rubbing head over it and takes a tizanidine  helps ease it. They occur once a week.  CTA head on 01/07/2022 personally reviewed revealed mild atherosclerotic plaque in the intercranial ICAs but no significant stenosis or aneurysms.  Those headaches resolved.   Past abortive medication:  ibuprofen , naproxen , Tylenol , Excedrin, sumatriptan  (effective but contraindicated due to cardiac history), Ubrelvy  100mg  (effective, not formulary) Past preventative medication:  none Other past therapy:  none   CT of head from 07/19/15 was unremarkable.    MRI of brain from 12/04/14 showed global atrophy and mild small vessel ischemic changes. 06/23/16:  Sed rate 25  PAST MEDICAL HISTORY: Past Medical History:  Diagnosis Date   Barrett's esophagus 2011   Recommend repeat EGD for surveillance in 2 yrs-[facility]   Bipolar disorder (HCC)    Chronic kidney disease    Cirrhosis (HCC)    Diverticulosis of colon    GANGLION CYST, TENDON SHEATH 06/26/2010   Hyperlipidemia    Hypertension    Hypertriglyceridemia 01/21/2019   Impotence    Migraine    Pancreatitis    Pulmonary nodule    Pulmonary nodule seen on imaging study 02/06/2011   Followup CT in December 2014 with 20 months of stability at 4 mm.  No further imaging recommended     MEDICATIONS: Current Outpatient Medications on File Prior to Visit  Medication Sig Dispense Refill   albuterol  (VENTOLIN  HFA) 108 (90 Base) MCG/ACT inhaler INHALE 1 TO 2 PUFFS INTO THE LUNGS EVERY 6 HOURS AS NEEDED FOR WHEEZING OR SHORTNESS OF BREATH 18 g 5  amLODipine  (NORVASC ) 10 MG tablet TAKE 1 TABLET(10 MG) BY MOUTH DAILY 90 tablet 3   aspirin  81 MG chewable tablet Chew 81 mg by mouth daily.     BREZTRI  AEROSPHERE 160-9-4.8  MCG/ACT AERO inhaler INHALE 2 PUFFS INTO THE LUNGS TWICE DAILY 10.7 g 5   Ferric Maltol  (ACCRUFER ) 30 MG CAPS Take 1 capsule by mouth in the morning and at bedtime. 180 capsule 1   gabapentin  (NEURONTIN ) 300 MG capsule TAKE 1 CAPSULE(300 MG) BY MOUTH TWICE DAILY 180 capsule 1   icosapent  Ethyl (VASCEPA ) 1 g capsule Take 2 capsules (2 g total) by mouth 2 (two) times daily. 360 capsule 3   magnesium  oxide (MAG-OX) 400 (240 Mg) MG tablet Take 1 tablet (400 mg total) by mouth daily. 90 tablet 0   NURTEC 75 MG TBDP TAKE 1 TABLET AS NEEDED AT THE EARLIEST ON SET OF A MIGRAINE MAX OF 1 IN 24 HOURS 8 tablet 5   omeprazole  (PRILOSEC) 20 MG capsule TAKE 1 CAPSULE(20 MG) BY MOUTH TWICE DAILY BEFORE A MEAL 180 capsule 0   polyethylene glycol (MIRALAX  / GLYCOLAX ) 17 g packet Take 17 g by mouth daily. 14 each 0   risperiDONE  (RISPERDAL ) 1 MG tablet TAKE 1 TABLET(1 MG) BY MOUTH AT BEDTIME 90 tablet 0   rosuvastatin  (CRESTOR ) 20 MG tablet Take 1 tablet (20 mg total) by mouth daily. 90 tablet 3   thiamine  (VITAMIN B1) 100 MG tablet Take 1 tablet (100 mg total) by mouth daily. 90 tablet 1   topiramate  (TOPAMAX ) 100 MG tablet TAKE 1 TABLET(100 MG) BY MOUTH TWICE DAILY 60 tablet 11   traMADol  (ULTRAM ) 50 MG tablet Take 1 tablet (50 mg total) by mouth every 12 (twelve) hours as needed. 180 tablet 0   traZODone  (DESYREL ) 100 MG tablet TAKE 2 TABLETS(200 MG) BY MOUTH AT BEDTIME 60 tablet 0   Ubrogepant  (UBRELVY ) 100 MG TABS TAKE 1 TABLET(100 MG) BY MOUTH 1 TIME EVERY 2 HOURS AS DIRECTED AS NEEDED. MAXIMUM 2 TABLETS IN 24 HOURS 16 tablet 11   valACYclovir  (VALTREX ) 500 MG tablet TAKE 1 TABLET(500 MG) BY MOUTH DAILY 90 tablet 0   Zoster Vaccine Adjuvanted (SHINGRIX ) injection Inject 0.5 mLs into the muscle once for 1 dose. 0.5 mL 1   No current facility-administered medications on file prior to visit.    ALLERGIES: Allergies  Allergen Reactions   Other     Other reaction(s): rash, sweat, nausea Other reaction(s):  rash, sweat, nausea   Codeine Phosphate Other (See Comments)    REACTION: breathing difficulty   Penicillins Rash and Other (See Comments)    Break out in sweats/ Rash amoxicillin. Denies airway involvement Has patient had a PCN reaction causing immediate rash, facial/tongue/throat swelling, SOB or lightheadedness with hypotension: Yes Has patient had a PCN reaction causing severe rash involving mucus membranes or skin necrosis: Yes Has patient had a PCN reaction that required hospitalization No Has patient had a PCN reaction occurring within the last 10 years: No If all of the above answers are NO, then may proceed with Cephalosporin u    FAMILY HISTORY: Family History  Problem Relation Age of Onset   Anemia Mother    Hypertension Daughter    Diabetes Daughter    Migraines Daughter       Objective:  *** General: No acute distress.  Patient appears ***-groomed.   Head:  Normocephalic/atraumatic Eyes:  Fundi examined but not visualized Neck: supple, no paraspinal tenderness, full range of motion Heart:  Regular rate and rhythm Neurological Exam: alert and oriented.  Speech fluent and not dysarthric, language intact.  CN II-XII intact. Bulk and tone normal, muscle strength 5/5 throughout.  Sensation to light touch intact.  Deep tendon reflexes 2+ throughout, toes downgoing.  Finger to nose testing intact.  Gait normal, Romberg negative.   Juliene Dunnings, DO  CC: ***

## 2024-07-01 ENCOUNTER — Ambulatory Visit: Admitting: Neurology

## 2024-07-03 ENCOUNTER — Other Ambulatory Visit: Payer: Self-pay | Admitting: Internal Medicine

## 2024-07-03 DIAGNOSIS — F5104 Psychophysiologic insomnia: Secondary | ICD-10-CM

## 2024-07-05 ENCOUNTER — Telehealth: Payer: Self-pay | Admitting: Cardiovascular Disease

## 2024-07-05 NOTE — Telephone Encounter (Signed)
 Pt c/o medication issue:  1. Name of Medication: rosuvastatin  (CRESTOR ) 20 MG tablet   2. How are you currently taking this medication (dosage and times per day)?  Take 1 tablet (20 mg total) by mouth daily.     3. Are you having a reaction (difficulty breathing--STAT)? No  4. What is your medication issue? Pt is requesting a callback regarding him wanting to know at what point of the day is he supposed to take this medication. Please advise

## 2024-07-05 NOTE — Telephone Encounter (Signed)
 Spoke with patient and advised to take in the evening

## 2024-07-11 NOTE — Progress Notes (Unsigned)
 NEUROLOGY FOLLOW UP OFFICE NOTE  Jaime Barnes [id number]  Assessment/Plan:   Migraine without aura, without status migrainosus, not intractable Left parietal headache   Migraine prevention:  Topiramate  100mg  twice daily Migraine rescue:  Nurtec PRN Lifestyle modification: Limit use of pain relievers to no more than 9 days out of the month to prevent risk of rebound or medication-overuse headache. Diet modification/hydration/caffeine cessation Routine exercise Sleep hygiene Consider vitamins/supplements:  magnesium  citrate 400mg  daily, riboflavin 400mg  daily, CoQ10 100mg  three times daily Keep headache diary Follow up ***   Subjective:  Jaime Barnes is a 73 year old right-handed man with hypertension, bipolar disorder, CKD, tobacco use, Barrett's esophagus and cirrhosis who follows up for migraine.   UPDATE: ***  Intensity:  10/10 Duration:  Within 30 minutes with Nurtec Frequency:  2-3 a month   Started getting the sharp left parietal headaches a couple of months ago.  Occurs every 1 to 3 weeks.  3-4 minutes with Nurtec.      Current NSAIDS:  none Current analgesics:  none Current triptans: none Current anti-emetic:  none Current muscle relaxants:  none Current Antihypertensive medications:  Lisinopril  10mg , verapamil  120mg  three times daily, metoprolol  Current Antidepressant medications:  none Current Anticonvulsant medications: topiramate  100mg  twice daily, gabapentin  300mg  twice daily Current anti-CGRP:  Nurtec 75mg  PRN Current vitamins/supplements:  magnesium  oxide 400mg  Other medications:  Risperidone , trazodone      HISTORY: Onset:  Has remote history of migraines but they returned in 2016 after experiencing increased stress related to his ex-girlfriend. Location:  Right sided Quality:  pounding Initial Intensity:  50/10 Aura:  no Prodrome:  no Associated symptoms: Photophobia, phonophobia.  No nausea or visual disturbance Initial Duration:  Until  takes medication Initial Frequency:  every other day Triggers:  Emotional stress Relieving factors: None Activity:  Cannot function  In 2023, he started getting a squeezing/throbbing pain in his left parietal region last 3-4 minutes.  Squeezing and rubbing head over it and takes a tizanidine  helps ease it. They occur once a week.  CTA head on 01/07/2022 personally reviewed revealed mild atherosclerotic plaque in the intercranial ICAs but no significant stenosis or aneurysms.  Those headaches resolved.   Past abortive medication:  ibuprofen , naproxen , Tylenol , Excedrin, sumatriptan  (effective but contraindicated due to cardiac history), Ubrelvy  100mg  (effective, not formulary) Past preventative medication:  none Other past therapy:  none   CT of head from 07/19/15 was unremarkable.    MRI of brain from 12/04/14 showed global atrophy and mild small vessel ischemic changes. 06/23/16:  Sed rate 25  PAST MEDICAL HISTORY: Past Medical History:  Diagnosis Date   Barrett's esophagus 2011   Recommend repeat EGD for surveillance in 2 yrs-[facility]   Bipolar disorder (HCC)    Chronic kidney disease    Cirrhosis (HCC)    Diverticulosis of colon    GANGLION CYST, TENDON SHEATH 06/26/2010   Hyperlipidemia    Hypertension    Hypertriglyceridemia 01/21/2019   Impotence    Migraine    Pancreatitis    Pulmonary nodule    Pulmonary nodule seen on imaging study 02/06/2011   Followup CT in December 2014 with 20 months of stability at 4 mm.  No further imaging recommended     MEDICATIONS: Current Outpatient Medications on File Prior to Visit  Medication Sig Dispense Refill   albuterol  (VENTOLIN  HFA) 108 (90 Base) MCG/ACT inhaler INHALE 1 TO 2 PUFFS INTO THE LUNGS EVERY 6 HOURS AS NEEDED FOR WHEEZING OR SHORTNESS OF BREATH 18 g 5  amLODipine  (NORVASC ) 10 MG tablet TAKE 1 TABLET(10 MG) BY MOUTH DAILY 90 tablet 3   aspirin  81 MG chewable tablet Chew 81 mg by mouth daily.     BREZTRI  AEROSPHERE 160-9-4.8  MCG/ACT AERO inhaler INHALE 2 PUFFS INTO THE LUNGS TWICE DAILY 10.7 g 5   Ferric Maltol  (ACCRUFER ) 30 MG CAPS Take 1 capsule by mouth in the morning and at bedtime. 180 capsule 1   gabapentin  (NEURONTIN ) 300 MG capsule TAKE 1 CAPSULE(300 MG) BY MOUTH TWICE DAILY 180 capsule 1   icosapent  Ethyl (VASCEPA ) 1 g capsule Take 2 capsules (2 g total) by mouth 2 (two) times daily. 360 capsule 3   magnesium  oxide (MAG-OX) 400 (240 Mg) MG tablet Take 1 tablet (400 mg total) by mouth daily. 90 tablet 0   NURTEC 75 MG TBDP TAKE 1 TABLET AS NEEDED AT THE EARLIEST ON SET OF A MIGRAINE MAX OF 1 IN 24 HOURS 8 tablet 5   omeprazole  (PRILOSEC) 20 MG capsule TAKE 1 CAPSULE(20 MG) BY MOUTH TWICE DAILY BEFORE A MEAL 180 capsule 0   polyethylene glycol (MIRALAX  / GLYCOLAX ) 17 g packet Take 17 g by mouth daily. 14 each 0   risperiDONE  (RISPERDAL ) 1 MG tablet TAKE 1 TABLET(1 MG) BY MOUTH AT BEDTIME 90 tablet 0   rosuvastatin  (CRESTOR ) 20 MG tablet Take 1 tablet (20 mg total) by mouth daily. 90 tablet 3   thiamine  (VITAMIN B1) 100 MG tablet Take 1 tablet (100 mg total) by mouth daily. 90 tablet 1   topiramate  (TOPAMAX ) 100 MG tablet TAKE 1 TABLET(100 MG) BY MOUTH TWICE DAILY 60 tablet 11   traMADol  (ULTRAM ) 50 MG tablet Take 1 tablet (50 mg total) by mouth every 12 (twelve) hours as needed. 180 tablet 0   traZODone  (DESYREL ) 100 MG tablet TAKE 2 TABLETS(200 MG) BY MOUTH AT BEDTIME 60 tablet 1   Ubrogepant  (UBRELVY ) 100 MG TABS TAKE 1 TABLET(100 MG) BY MOUTH 1 TIME EVERY 2 HOURS AS DIRECTED AS NEEDED. MAXIMUM 2 TABLETS IN 24 HOURS 16 tablet 11   valACYclovir  (VALTREX ) 500 MG tablet TAKE 1 TABLET(500 MG) BY MOUTH DAILY 90 tablet 0   Zoster Vaccine Adjuvanted (SHINGRIX ) injection Inject 0.5 mLs into the muscle once for 1 dose. 0.5 mL 1   No current facility-administered medications on file prior to visit.    ALLERGIES: Allergies  Allergen Reactions   Other     Other reaction(s): rash, sweat, nausea Other reaction(s):  rash, sweat, nausea   Codeine Phosphate Other (See Comments)    REACTION: breathing difficulty   Penicillins Rash and Other (See Comments)    Break out in sweats/ Rash amoxicillin. Denies airway involvement Has patient had a PCN reaction causing immediate rash, facial/tongue/throat swelling, SOB or lightheadedness with hypotension: Yes Has patient had a PCN reaction causing severe rash involving mucus membranes or skin necrosis: Yes Has patient had a PCN reaction that required hospitalization No Has patient had a PCN reaction occurring within the last 10 years: No If all of the above answers are NO, then may proceed with Cephalosporin u    FAMILY HISTORY: Family History  Problem Relation Age of Onset   Anemia Mother    Hypertension Daughter    Diabetes Daughter    Migraines Daughter       Objective:  *** General: No acute distress.  Patient appears ***-groomed.   Head:  Normocephalic/atraumatic Eyes:  Fundi examined but not visualized Neck: supple, no paraspinal tenderness, full range of motion Heart:  Regular rate and rhythm Neurological Exam: alert and oriented.  Speech fluent and not dysarthric, language intact.  CN II-XII intact. Bulk and tone normal, muscle strength 5/5 throughout.  Sensation to light touch intact.  Deep tendon reflexes 2+ throughout, toes downgoing.  Finger to nose testing intact.  Gait normal, Romberg negative.   Juliene Dunnings, DO  CC: ***

## 2024-07-12 ENCOUNTER — Ambulatory Visit (INDEPENDENT_AMBULATORY_CARE_PROVIDER_SITE_OTHER): Admitting: Neurology

## 2024-07-12 ENCOUNTER — Encounter: Payer: Self-pay | Admitting: Neurology

## 2024-07-12 VITALS — BP 124/72 | HR 83 | Ht 66.0 in | Wt 165.0 lb

## 2024-07-12 DIAGNOSIS — M5481 Occipital neuralgia: Secondary | ICD-10-CM | POA: Diagnosis not present

## 2024-07-12 DIAGNOSIS — G43009 Migraine without aura, not intractable, without status migrainosus: Secondary | ICD-10-CM

## 2024-07-12 MED ORDER — NURTEC 75 MG PO TBDP: 1.0000 | ORAL_TABLET | Freq: Every day | ORAL | 5 refills | Status: AC | PRN

## 2024-07-12 MED ORDER — TOPIRAMATE 100 MG PO TABS: ORAL_TABLET | ORAL | 11 refills | Status: AC

## 2024-07-12 MED ORDER — GABAPENTIN 300 MG PO CAPS: 600.0000 mg | ORAL_CAPSULE | Freq: Two times a day (BID) | ORAL | 5 refills | Status: AC

## 2024-07-12 NOTE — Patient Instructions (Signed)
 Increase gabapentin  to 600mg  twice daily.  If no improvement in 4-6 weeks, contact me and I will refer you to physical therapy  Continue topiramate  and Nurtec

## 2024-07-13 ENCOUNTER — Other Ambulatory Visit: Payer: Self-pay | Admitting: Internal Medicine

## 2024-07-13 DIAGNOSIS — M792 Neuralgia and neuritis, unspecified: Secondary | ICD-10-CM

## 2024-07-13 DIAGNOSIS — M19011 Primary osteoarthritis, right shoulder: Secondary | ICD-10-CM

## 2024-07-15 ENCOUNTER — Ambulatory Visit (INDEPENDENT_AMBULATORY_CARE_PROVIDER_SITE_OTHER)

## 2024-07-15 DIAGNOSIS — E538 Deficiency of other specified B group vitamins: Secondary | ICD-10-CM | POA: Diagnosis not present

## 2024-07-15 MED ORDER — CYANOCOBALAMIN 1000 MCG/ML IJ SOLN
1000.0000 ug | Freq: Once | INTRAMUSCULAR | Status: AC
  Administered 2024-07-15: 1000 ug via INTRAMUSCULAR

## 2024-07-15 NOTE — Progress Notes (Signed)
 After obtaining consent, and per orders of Dr. Joshua, injection of B12 given by Edsel CHRISTELLA Kerns. Patient tolerated well.

## 2024-07-30 ENCOUNTER — Other Ambulatory Visit: Payer: Self-pay | Admitting: Internal Medicine

## 2024-07-30 DIAGNOSIS — J418 Mixed simple and mucopurulent chronic bronchitis: Secondary | ICD-10-CM

## 2024-08-05 ENCOUNTER — Encounter: Payer: Self-pay | Admitting: Podiatry

## 2024-08-05 ENCOUNTER — Ambulatory Visit: Admitting: Podiatry

## 2024-08-05 DIAGNOSIS — N183 Chronic kidney disease, stage 3 unspecified: Secondary | ICD-10-CM

## 2024-08-05 DIAGNOSIS — M79674 Pain in right toe(s): Secondary | ICD-10-CM | POA: Diagnosis not present

## 2024-08-05 DIAGNOSIS — M79675 Pain in left toe(s): Secondary | ICD-10-CM | POA: Diagnosis not present

## 2024-08-05 DIAGNOSIS — B351 Tinea unguium: Secondary | ICD-10-CM | POA: Diagnosis not present

## 2024-08-05 NOTE — Progress Notes (Signed)
This patient returns to my office for at risk foot care.  This patient requires this care by a professional since this patient will be at risk due to having chronic kidney disease  This patient is unable to cut nails himself since the patient cannot reach his nails.These nails are painful walking and wearing shoes.  This patient presents for at risk foot care today.  General Appearance  Alert, conversant and in no acute stress.  Vascular  Dorsalis pedis and posterior tibial  pulses are weakly  palpable  bilaterally.  Capillary return is within normal limits  bilaterally. Temperature is within normal limits  bilaterally.  Neurologic  Senn-Weinstein monofilament wire test within normal limits  bilaterally. Muscle power within normal limits bilaterally.  Nails Thick disfigured discolored nails with subungual debris  from hallux to fifth toes bilaterally. No evidence of bacterial infection or drainage bilaterally.  Orthopedic  No limitations of motion  feet .  No crepitus or effusions noted.  No bony pathology or digital deformities noted.  Skin  normotropic skin with no porokeratosis noted bilaterally.  No signs of infections or ulcers noted.     Onychomycosis  Pain in right toes  Pain in left toes  Consent was obtained for treatment procedures.   Mechanical debridement of nails 1-5  bilaterally performed with a nail nipper.  Filed with dremel without incident.    Return office visit   12  weeks                  Told patient to return for periodic foot care and evaluation due to potential at risk complications.   Helane Gunther DPM

## 2024-08-15 ENCOUNTER — Ambulatory Visit

## 2024-09-02 ENCOUNTER — Other Ambulatory Visit: Payer: Self-pay | Admitting: Internal Medicine

## 2024-09-02 DIAGNOSIS — F5104 Psychophysiologic insomnia: Secondary | ICD-10-CM

## 2024-09-06 ENCOUNTER — Encounter: Payer: Self-pay | Admitting: Internal Medicine

## 2024-09-06 ENCOUNTER — Ambulatory Visit: Admitting: Internal Medicine

## 2024-09-06 ENCOUNTER — Ambulatory Visit: Payer: Self-pay | Admitting: *Deleted

## 2024-09-06 VITALS — BP 136/82 | HR 76 | Temp 98.3°F | Ht 66.0 in | Wt 166.0 lb

## 2024-09-06 DIAGNOSIS — J449 Chronic obstructive pulmonary disease, unspecified: Secondary | ICD-10-CM | POA: Insufficient documentation

## 2024-09-06 DIAGNOSIS — F17200 Nicotine dependence, unspecified, uncomplicated: Secondary | ICD-10-CM

## 2024-09-06 DIAGNOSIS — H60502 Unspecified acute noninfective otitis externa, left ear: Secondary | ICD-10-CM

## 2024-09-06 DIAGNOSIS — M25562 Pain in left knee: Secondary | ICD-10-CM | POA: Insufficient documentation

## 2024-09-06 DIAGNOSIS — H6092 Unspecified otitis externa, left ear: Secondary | ICD-10-CM | POA: Insufficient documentation

## 2024-09-06 DIAGNOSIS — J418 Mixed simple and mucopurulent chronic bronchitis: Secondary | ICD-10-CM

## 2024-09-06 MED ORDER — NEOMYCIN-POLYMYXIN-HC 1 % OT SOLN: 3.0000 [drp] | Freq: Four times a day (QID) | OTIC | 0 refills | Status: AC

## 2024-09-06 MED ORDER — BREZTRI AEROSPHERE 160-9-4.8 MCG/ACT IN AERO: 2.0000 | INHALATION_SPRAY | Freq: Two times a day (BID) | RESPIRATORY_TRACT | 5 refills | Status: AC

## 2024-09-06 NOTE — Progress Notes (Signed)
 Patient ID: Jaime Barnes, male   DOB: 07/03/1951, 73 y.o.   MRN: [id number]        Chief Complaint: follow up left external otitis, left knee arthritis, copd       HPI:  Jaime Barnes is a 73 y.o. male here with c/o mild to mod left ear pain, feverish and slight non bloody d/c noted x 3 days.  Also has 1 wk onset mod to severe intermittent left knee pain with effusion, without fever, trauma or fall.  No hx of gout.  Pt denies chest pain, increased sob or doe, wheezing, orthopnea, PND, increased LE swelling, palpitations, dizziness or syncope, but has ongoing mild sob doe wheeziness not yet started the Breztri  per PCP from June 2025.  Still smoking, not ready to quit.         Wt Readings from Last 3 Encounters:  09/06/24 166 lb (75.3 kg)  07/12/24 165 lb (74.8 kg)  06/07/24 163 lb 9.6 oz (74.2 kg)   BP Readings from Last 3 Encounters:  09/06/24 136/82  07/12/24 124/72  06/07/24 (!) 148/82         Past Medical History:  Diagnosis Date   Barrett's esophagus 2011   Recommend repeat EGD for surveillance in 2 yrs-[facility]   Bipolar disorder (HCC)    Chronic kidney disease    Cirrhosis (HCC)    Diverticulosis of colon    GANGLION CYST, TENDON SHEATH 06/26/2010   Hyperlipidemia    Hypertension    Hypertriglyceridemia 01/21/2019   Impotence    Migraine    Pancreatitis    Pulmonary nodule    Pulmonary nodule seen on imaging study 02/06/2011   Followup CT in December 2014 with 20 months of stability at 4 mm.  No further imaging recommended    Past Surgical History:  Procedure Laterality Date   CARDIAC CATHETERIZATION  2005   Nonobstructive, <40%   TONSILLECTOMY Bilateral 1961    reports that he has been smoking cigars. He quit smokeless tobacco use about 7 years ago. He reports current drug use. Drug: Marijuana. He reports that he does not drink alcohol. family history includes Anemia in his mother; Diabetes in his daughter; Hypertension in his daughter; Migraines in his  daughter. Allergies  Allergen Reactions   Other     Other reaction(s): rash, sweat, nausea Other reaction(s): rash, sweat, nausea   Codeine Phosphate Other (See Comments)    REACTION: breathing difficulty   Penicillins Rash and Other (See Comments)    Break out in sweats/ Rash amoxicillin. Denies airway involvement Has patient had a PCN reaction causing immediate rash, facial/tongue/throat swelling, SOB or lightheadedness with hypotension: Yes Has patient had a PCN reaction causing severe rash involving mucus membranes or skin necrosis: Yes Has patient had a PCN reaction that required hospitalization No Has patient had a PCN reaction occurring within the last 10 years: No If all of the above answers are NO, then may proceed with Cephalosporin u   Current Outpatient Medications on File Prior to Visit  Medication Sig Dispense Refill   albuterol  (VENTOLIN  HFA) 108 (90 Base) MCG/ACT inhaler INHALE 1 TO 2 PUFFS INTO THE LUNGS EVERY 6 HOURS AS NEEDED FOR WHEEZING OR SHORTNESS OF BREATH 18 g 5   amLODipine  (NORVASC ) 10 MG tablet TAKE 1 TABLET(10 MG) BY MOUTH DAILY 90 tablet 3   aspirin  81 MG chewable tablet Chew 81 mg by mouth daily.     Ferric Maltol  (ACCRUFER ) 30 MG CAPS Take 1 capsule by mouth  in the morning and at bedtime. 180 capsule 1   gabapentin  (NEURONTIN ) 300 MG capsule Take 2 capsules (600 mg total) by mouth 2 (two) times daily. 120 capsule 5   icosapent  Ethyl (VASCEPA ) 1 g capsule Take 2 capsules (2 g total) by mouth 2 (two) times daily. 360 capsule 3   magnesium  oxide (MAG-OX) 400 (240 Mg) MG tablet Take 1 tablet (400 mg total) by mouth daily. 90 tablet 0   omeprazole  (PRILOSEC) 20 MG capsule TAKE 1 CAPSULE(20 MG) BY MOUTH TWICE DAILY BEFORE A MEAL 180 capsule 0   polyethylene glycol (MIRALAX  / GLYCOLAX ) 17 g packet Take 17 g by mouth daily. 14 each 0   Rimegepant Sulfate (NURTEC) 75 MG TBDP Take 1 tablet (75 mg total) by mouth daily as needed. TAKE 1 TABLET AS NEEDED AT THE  EARLIEST ON SET OF A MIGRAINE MAX OF 1 IN 24 HOURS 8 tablet 5   risperiDONE  (RISPERDAL ) 1 MG tablet TAKE 1 TABLET(1 MG) BY MOUTH AT BEDTIME 90 tablet 0   rosuvastatin  (CRESTOR ) 20 MG tablet Take 1 tablet (20 mg total) by mouth daily. 90 tablet 3   thiamine  (VITAMIN B1) 100 MG tablet Take 1 tablet (100 mg total) by mouth daily. 90 tablet 1   topiramate  (TOPAMAX ) 100 MG tablet TAKE 1 TABLET(100 MG) BY MOUTH TWICE DAILY 60 tablet 11   traMADol  (ULTRAM ) 50 MG tablet Take 1 tablet (50 mg total) by mouth every 12 (twelve) hours as needed. 180 tablet 0   traZODone  (DESYREL ) 100 MG tablet TAKE 2 TABLETS(200 MG) BY MOUTH AT BEDTIME 60 tablet 1   valACYclovir  (VALTREX ) 500 MG tablet TAKE 1 TABLET(500 MG) BY MOUTH DAILY (Patient taking differently: Take 500 mg by mouth as needed.) 90 tablet 0   Zoster Vaccine Adjuvanted (SHINGRIX ) injection Inject 0.5 mLs into the muscle once for 1 dose. 0.5 mL 1   No current facility-administered medications on file prior to visit.        ROS:  All others reviewed and negative.  Objective        PE:  BP 136/82 (BP Location: Left Arm, Patient Position: Sitting, Cuff Size: Normal)   Pulse 76   Temp 98.3 F (36.8 C) (Oral)   Ht 5' 6 (1.676 m)   Wt 166 lb (75.3 kg)   SpO2 99%   BMI 26.79 kg/m                 Constitutional: Pt appears in NAD               HENT: Head: NCAT.                Right Ear: External ear normal.                 Left Ear: External ear normal. But left canal with 1+ red, tender swelling without d/c               Eyes: . Pupils are equal, round, and reactive to light. Conjunctivae and EOM are normal               Nose: without d/c or deformity               Neck: Neck supple. Gross normal ROM               Cardiovascular: Normal rate and regular rhythm.                 Pulmonary/Chest: Effort  normal and breath sounds without rales or wheezing.                Left knee with 1-2+ effusion, warm, mild tender               Neurological: Pt is  alert. At baseline orientation, motor grossly intact               Skin: Skin is warm. No rashes, no other new lesions, LE edema - none               Psychiatric: Pt behavior is normal without agitation   Micro: none  Cardiac tracings I have personally interpreted today:  none  Pertinent Radiological findings (summarize): none   Lab Results  Component Value Date   WBC 10.2 06/07/2024   HGB 13.8 06/07/2024   HCT 40.7 06/07/2024   PLT 291.0 06/07/2024   GLUCOSE 75 06/07/2024   CHOL 104 12/02/2023   TRIG 135.0 12/02/2023   HDL 40.30 12/02/2023   LDLDIRECT 156 (H) 06/09/2011   LDLCALC 37 12/02/2023   ALT 11 12/02/2023   AST 12 12/02/2023   NA 141 06/07/2024   K 4.0 06/07/2024   CL 108 06/07/2024   CREATININE 1.26 06/07/2024   BUN 10 06/07/2024   CO2 25 06/07/2024   TSH 3.60 12/02/2023   PSA 1.34 06/07/2024   INR 1.2 (H) 12/02/2023   HGBA1C 5.8 (H) 12/03/2020   Assessment/Plan:  Lio Wehrly is a 73 y.o. Black or African American [2] male with  has a past medical history of Barrett's esophagus (2011), Bipolar disorder (HCC), Chronic kidney disease, Cirrhosis (HCC), Diverticulosis of colon, GANGLION CYST, TENDON SHEATH (06/26/2010), Hyperlipidemia, Hypertension, Hypertriglyceridemia (01/21/2019), Impotence, Migraine, Pancreatitis, Pulmonary nodule, and Pulmonary nodule seen on imaging study (02/06/2011).  TOBACCO USER Pt counsled to quit, pt not ready  External otitis of left ear Mild to mod, for antibx course cortisporin otic asd,  to f/u any worsening symptoms or concerns  COPD (chronic obstructive pulmonary disease) (HCC) With mild persistent symptoms, cont albuterol  but encouraged to also start the Breztri  as per pcp  Acute pain of left knee With 1 wk onset, likely DJD related, continue tramadol  prn, declines xray for now but will refer to Sports Medicine for f/u  Followup: Return if symptoms worsen or fail to improve.  Jaime Rush, MD 09/06/2024 8:14 PM [facility]

## 2024-09-06 NOTE — Assessment & Plan Note (Signed)
Mild to mod, for antibx course cortisporin otic asd,  to f/u any worsening symptoms or concerns

## 2024-09-06 NOTE — Telephone Encounter (Signed)
 FYI Only or Action Required?: FYI only for provider: appointment scheduled on 12/9.  Patient was last seen in primary care on 06/07/2024 by Jaime Debby CROME, MD.  Called Nurse Triage reporting Joint Swelling.  Symptoms began several weeks ago.  Interventions attempted: Prescription medications: Tramadol .  Symptoms are: gradually worsening.  Triage Disposition: See HCP Within 4 Hours (Or PCP Triage)  Patient/caregiver understands and will follow disposition?: Yes  Copied from CRM #[id number]. Topic: Clinical - Red Word Triage >> Sep 06, 2024  8:02 AM Jaime Barnes wrote: Red Word that prompted transfer to Nurse Triage: Pain in thighs, left knee is swollen, states ears hurt really bad. Reason for Disposition  [1] Thigh or calf swelling AND [2] only 1 side  Answer Assessment - Initial Assessment Questions Patient is requesting handicap card for car- he is having trouble walking extended distance.    1. LOCATION: Where is the swelling located?  (e.g., left, right, both knees)     Left knee swelling and calf, bilateral thigh pain 2. ONSET: When did the swelling start? Does it come and go, or is it there all the time?     3 weeks, all the time-constant 3. SWELLING: How bad is the swelling? Or, How large is it? (e.g., mild, moderate, severe; size of localized swelling)      Whole knee-down into the calf 4. PAIN: Is there any pain? If Yes, ask: How bad is it? (Scale 0-10; or none, mild, moderate, severe)     Unable to walk extended distance, 7/10- uses Tramadol - does help the pain 5. SETTING: Has there been any recent work, exercise or other activity that involved that part of the body?      no 6. AGGRAVATING FACTORS: What makes the knee swelling worse? (e.g., walking, climbing stairs, running)     Movement, climbing stairs 7. ASSOCIATED SYMPTOMS: Is there any pain or redness?     Pain only- no redness 8. OTHER SYMPTOMS: Do you have any other symptoms? (e.g., calf pain, chest  pain, difficulty breathing, fever)     Thigh pain, calf pain, cough, ear pain  Protocols used: Knee Swelling-A-AH

## 2024-09-06 NOTE — Assessment & Plan Note (Signed)
 Pt counsled to quit, pt not ready

## 2024-09-06 NOTE — Assessment & Plan Note (Signed)
 With mild persistent symptoms, cont albuterol  but encouraged to also start the Breztri  as per pcp

## 2024-09-06 NOTE — Assessment & Plan Note (Signed)
 With 1 wk onset, likely DJD related, continue tramadol  prn, declines xray for now but will refer to Sports Medicine for f/u

## 2024-09-06 NOTE — Patient Instructions (Signed)
 Please take all new medication as prescribed - the antibiotic drops for the left ear  Ok to start the Breztri  as per Dr Joshua  Please continue all other medications as before, including the tramadol   Please have the pharmacy call with any other refills you may need.  Please continue your efforts at being more active, low cholesterol diet, and weight control.  Please keep your appointments with your specialists as you may have planned  You will be contacted regarding the referral for: Sports Medicine for the left knee

## 2024-09-07 NOTE — Progress Notes (Signed)
 Jaime Barnes D.[facility] [address] [place] [address] Phone: [phone]   Assessment and Plan:     1. Primary osteoarthritis of left knee 2. Chronic pain of left knee (Primary) -Chronic with exacerbation, initial visit - Consistent with flare of primarily medial compartment osteoarthritis of left knee - X-ray obtained in clinic.  My interpretation: No acute fracture or dislocation.  Mild to moderate decreased joint space in medial compartment.  Atherosclerosis present.  Apparent chondrocalcinosis present within joint space - Start meloxicam  15 mg daily x2 weeks.  If still having pain after 2 weeks, complete 3rd-week of NSAID. May use remaining NSAID as needed once daily for pain control.  Do not to use additional over-the-counter NSAIDs (ibuprofen , naproxen , Advil , Aleve , etc.) while taking prescription NSAIDs.  May use Tylenol  [phone] mg 2 to 3 times a day for breakthrough pain. - Start HEP for knee  15 additional minutes spent for educating Therapeutic Home Exercise Program.  This included exercises focusing on stretching, strengthening, with focus on eccentric aspects.   Long term goals include an improvement in range of motion, strength, endurance as well as avoiding reinjury. Patient's frequency would include in 1-2 times a day, 3-5 times a week for a duration of 6-12 weeks. Proper technique shown and discussed handout in great detail with ATC.  All questions were discussed and answered.      Pertinent previous records reviewed include none   Follow Up: 4 to 6 weeks for reevaluation.  Could consider CSI versus physical therapy   Subjective:   I, Jaime Barnes, am serving as a neurosurgeon for Doctor Morene Mace  Chief Complaint: left knee pain   HPI:   09/08/2024 Patient is a 72 year old male with left knee pain. Patient states swelling started 4-5 months ago. Decreased ROM. When he walks his pain is at the back of his legs.  Tramadol  helps with the pain. No numbness or tingling. When he massages his knee that helps a little. When he sits that's when the pain is present    Relevant Historical Information: CAD, PAD, hypertension, COPD, Barrett's esophagus, PUD, cirrhosis, CPPD, CKD 3 AA, bipolar 1  Additional pertinent review of systems negative.   Current Outpatient Medications:    albuterol  (VENTOLIN  HFA) 108 (90 Base) MCG/ACT inhaler, INHALE 1 TO 2 PUFFS INTO THE LUNGS EVERY 6 HOURS AS NEEDED FOR WHEEZING OR SHORTNESS OF BREATH, Disp: 18 g, Rfl: 5   amLODipine  (NORVASC ) 10 MG tablet, TAKE 1 TABLET(10 MG) BY MOUTH DAILY, Disp: 90 tablet, Rfl: 3   aspirin  81 MG chewable tablet, Chew 81 mg by mouth daily., Disp: , Rfl:    Ferric Maltol  (ACCRUFER ) 30 MG CAPS, Take 1 capsule by mouth in the morning and at bedtime., Disp: 180 capsule, Rfl: 1   gabapentin  (NEURONTIN ) 300 MG capsule, Take 2 capsules (600 mg total) by mouth 2 (two) times daily., Disp: 120 capsule, Rfl: 5   icosapent  Ethyl (VASCEPA ) 1 g capsule, Take 2 capsules (2 g total) by mouth 2 (two) times daily., Disp: 360 capsule, Rfl: 3   magnesium  oxide (MAG-OX) 400 (240 Mg) MG tablet, Take 1 tablet (400 mg total) by mouth daily., Disp: 90 tablet, Rfl: 0   meloxicam  (MOBIC ) 15 MG tablet, Take 1 tablet daily for 2 weeks.  If still in pain after 2 weeks, take 1 tablet daily for an additional 1 week., Disp: 30 tablet, Rfl: 0   omeprazole  (PRILOSEC) 20 MG capsule, TAKE 1  CAPSULE(20 MG) BY MOUTH TWICE DAILY BEFORE A MEAL, Disp: 180 capsule, Rfl: 0   polyethylene glycol (MIRALAX  / GLYCOLAX ) 17 g packet, Take 17 g by mouth daily., Disp: 14 each, Rfl: 0   Rimegepant Sulfate (NURTEC) 75 MG TBDP, Take 1 tablet (75 mg total) by mouth daily as needed. TAKE 1 TABLET AS NEEDED AT THE EARLIEST ON SET OF A MIGRAINE MAX OF 1 IN 24 HOURS, Disp: 8 tablet, Rfl: 5   risperiDONE  (RISPERDAL ) 1 MG tablet, TAKE 1 TABLET(1 MG) BY MOUTH AT BEDTIME, Disp: 90 tablet, Rfl: 0   rosuvastatin   (CRESTOR ) 20 MG tablet, Take 1 tablet (20 mg total) by mouth daily., Disp: 90 tablet, Rfl: 3   thiamine  (VITAMIN B1) 100 MG tablet, Take 1 tablet (100 mg total) by mouth daily., Disp: 90 tablet, Rfl: 1   topiramate  (TOPAMAX ) 100 MG tablet, TAKE 1 TABLET(100 MG) BY MOUTH TWICE DAILY, Disp: 60 tablet, Rfl: 11   traMADol  (ULTRAM ) 50 MG tablet, Take 1 tablet (50 mg total) by mouth every 12 (twelve) hours as needed., Disp: 180 tablet, Rfl: 0   traZODone  (DESYREL ) 100 MG tablet, TAKE 2 TABLETS(200 MG) BY MOUTH AT BEDTIME, Disp: 60 tablet, Rfl: 1   valACYclovir  (VALTREX ) 500 MG tablet, TAKE 1 TABLET(500 MG) BY MOUTH DAILY (Patient taking differently: Take 500 mg by mouth as needed.), Disp: 90 tablet, Rfl: 0   budesonide-glycopyrrolate-formoterol (BREZTRI  AEROSPHERE) 160-9-4.8 MCG/ACT AERO inhaler, Inhale 2 puffs into the lungs 2 (two) times daily., Disp: 10.7 g, Rfl: 5   NEOMYCIN -POLYMYXIN-HYDROCORTISONE (CORTISPORIN) 1 % SOLN OTIC solution, Place 3 drops into the left ear 4 (four) times daily., Disp: 10 mL, Rfl: 0   Zoster Vaccine Adjuvanted (SHINGRIX ) injection, Inject 0.5 mLs into the muscle once for 1 dose., Disp: 0.5 mL, Rfl: 1   Objective:     Vitals:   09/08/24 0920  Pulse: (!) 55  SpO2: 98%  Weight: 163 lb (73.9 kg)  Height: 5' 6 (1.676 m)      Body mass index is 26.31 kg/m.    Physical Exam:    General:  awake, alert oriented, no acute distress nontoxic Skin: no suspicious lesions or rashes Neuro:sensation intact and strength 5/5 with no deficits, no atrophy, normal muscle tone Psych: No signs of anxiety, depression or other mood disorder  Left knee: Mild swelling No deformity Neg fluid wave, joint milking ROM Flex 110, Ext 0 TTP lateral joint line NTTP over the quad tendon, medial fem condyle, lat fem condyle, patella, patella tendon, tibial tuberostiy, fibular head, posterior fossa, pes anserine bursa, gerdy's tubercle, medial jt line,  Neg anterior and posterior drawer Neg  lachman Negative varus stress Negative valgus stress Negative McMurray for palpable pop, though reproduced pain Positive Thessaly  Gait antalgic, favoring right leg   Electronically signed by:  Odis Mace D.[facility] 9:38 AM 09/08/2024

## 2024-09-08 ENCOUNTER — Ambulatory Visit: Admitting: Sports Medicine

## 2024-09-08 ENCOUNTER — Ambulatory Visit

## 2024-09-08 VITALS — HR 55 | Ht 66.0 in | Wt 163.0 lb

## 2024-09-08 DIAGNOSIS — G8929 Other chronic pain: Secondary | ICD-10-CM | POA: Diagnosis not present

## 2024-09-08 DIAGNOSIS — M25562 Pain in left knee: Secondary | ICD-10-CM | POA: Diagnosis not present

## 2024-09-08 DIAGNOSIS — M1712 Unilateral primary osteoarthritis, left knee: Secondary | ICD-10-CM

## 2024-09-08 MED ORDER — MELOXICAM 15 MG PO TABS: ORAL_TABLET | ORAL | 0 refills | Status: AC

## 2024-09-08 NOTE — Patient Instructions (Signed)
-   Start meloxicam  15 mg daily x2 weeks.  If still having pain after 2 weeks, complete 3rd-week of NSAID. May use remaining NSAID as needed once daily for pain control.  Do not to use additional over-the-counter NSAIDs (ibuprofen , naproxen , Advil , Aleve , etc.) while taking prescription NSAIDs.  May use Tylenol  [phone] mg 2 to 3 times a day for breakthrough pain.  Knee HEP   4-6 week follow up

## 2024-09-14 ENCOUNTER — Ambulatory Visit

## 2024-09-14 ENCOUNTER — Ambulatory Visit: Payer: Self-pay | Admitting: Sports Medicine

## 2024-09-14 DIAGNOSIS — E538 Deficiency of other specified B group vitamins: Secondary | ICD-10-CM | POA: Diagnosis not present

## 2024-09-14 MED ORDER — CYANOCOBALAMIN 1000 MCG/ML IJ SOLN
1000.0000 ug | Freq: Once | INTRAMUSCULAR | Status: AC
  Administered 2024-09-14: 09:00:00 1000 ug via INTRAMUSCULAR

## 2024-09-14 NOTE — Progress Notes (Signed)
Patient received B12 injection today, tolerated well.

## 2024-09-19 ENCOUNTER — Other Ambulatory Visit: Payer: Self-pay | Admitting: Internal Medicine

## 2024-09-19 DIAGNOSIS — K2100 Gastro-esophageal reflux disease with esophagitis, without bleeding: Secondary | ICD-10-CM

## 2024-09-19 DIAGNOSIS — K2270 Barrett's esophagus without dysplasia: Secondary | ICD-10-CM

## 2024-09-28 DIAGNOSIS — F319 Bipolar disorder, unspecified: Secondary | ICD-10-CM

## 2024-10-05 ENCOUNTER — Other Ambulatory Visit: Payer: Self-pay | Admitting: Internal Medicine

## 2024-10-05 DIAGNOSIS — B009 Herpesviral infection, unspecified: Secondary | ICD-10-CM

## 2024-10-05 NOTE — Progress Notes (Signed)
 "               Odis Mace D.[facility]MENTE[name] [address] [place] [address] Phone: [phone]   Assessment and Plan:     1. Primary osteoarthritis of left knee (Primary) 2. Chronic pain of left knee -Chronic with exacerbation, subsequent visit - Overall significant improvement in flare of left knee medial compartment osteoarthritis after completing meloxicam  course, starting HEP - Continue HEP - Use meloxicam  15 mg daily as needed for breakthrough pain.  Recommend limiting chronic NSAIDs to 1-2 doses per week to prevent long-term side effects. Use Tylenol  500 to 1000 mg tablets 2-3 times a day as needed for day-to-day pain relief.       Pertinent previous records reviewed include none   Follow Up: As needed if no improvement or worsening of symptoms.  Could consider repeat NSAID course versus physical therapy versus CSI   Subjective:   I, Jaime Barnes, am serving as a neurosurgeon for Doctor Morene Mace   Chief Complaint: left knee pain    HPI:    09/08/2024 Patient is a 74 year old male with left knee pain. Patient states swelling started 4-5 months ago. Decreased ROM. When he walks his pain is at the back of his legs. Tramadol  helps with the pain. No numbness or tingling. When he massages his knee that helps a little. When he sits that's when the pain is present    10/06/2024 Patient states he is doing well   Relevant Historical Information: CAD, PAD, hypertension, COPD, Barrett's esophagus, PUD, cirrhosis, CPPD, CKD 3 AA, bipolar 1  Additional pertinent review of systems negative.  Current Medications[1]   Objective:     Vitals:   10/06/24 0954  Pulse: 77  SpO2: 100%  Weight: 162 lb (73.5 kg)  Height: 5' 6 (1.676 m)      Body mass index is 26.15 kg/m.    Physical Exam:    General:  awake, alert oriented, no acute distress nontoxic Skin: no suspicious lesions or rashes Neuro:sensation intact and strength 5/5 with no deficits, no  atrophy, normal muscle tone Psych: No signs of anxiety, depression or other mood disorder  Left knee: No swelling No deformity Neg fluid wave, joint milking ROM Flex 110, Ext 0 NTTP over the quad tendon, medial fem condyle, lat fem condyle, patella, patella tendon, tibial tuberostiy, fibular head, posterior fossa, pes anserine bursa, gerdy's tubercle, medial jt line, lateral jt line Neg anterior and posterior drawer Neg lachman Negative varus stress Negative valgus stress Negative McMurray Negative Thessaly  Gait normal    Electronically signed by:  Odis Mace D.[name] 10:24 AM 10/06/2024     [1]  Current Outpatient Medications:    meloxicam  (MOBIC ) 15 MG tablet, Take 1 tablet (15 mg total) by mouth daily as needed for pain., Disp: 30 tablet, Rfl: 0   albuterol  (VENTOLIN  HFA) 108 (90 Base) MCG/ACT inhaler, INHALE 1 TO 2 PUFFS INTO THE LUNGS EVERY 6 HOURS AS NEEDED FOR WHEEZING OR SHORTNESS OF BREATH, Disp: 18 g, Rfl: 5   amLODipine  (NORVASC ) 10 MG tablet, TAKE 1 TABLET(10 MG) BY MOUTH DAILY, Disp: 90 tablet, Rfl: 3   aspirin  81 MG chewable tablet, Chew 81 mg by mouth daily., Disp: , Rfl:    budesonide-glycopyrrolate-formoterol (BREZTRI  AEROSPHERE) 160-9-4.8 MCG/ACT AERO inhaler, Inhale 2 puffs into the lungs 2 (two) times daily., Disp: 10.7 g, Rfl: 5   Ferric Maltol  (ACCRUFER ) 30 MG CAPS, Take 1  capsule by mouth in the morning and at bedtime., Disp: 180 capsule, Rfl: 1   gabapentin  (NEURONTIN ) 300 MG capsule, Take 2 capsules (600 mg total) by mouth 2 (two) times daily., Disp: 120 capsule, Rfl: 5   icosapent  Ethyl (VASCEPA ) 1 g capsule, Take 2 capsules (2 g total) by mouth 2 (two) times daily., Disp: 360 capsule, Rfl: 3   magnesium  oxide (MAG-OX) 400 (240 Mg) MG tablet, Take 1 tablet (400 mg total) by mouth daily., Disp: 90 tablet, Rfl: 0   meloxicam  (MOBIC ) 15 MG tablet, Take 1 tablet daily for 2 weeks.  If still in pain after 2 weeks, take 1 tablet daily  for an additional 1 week., Disp: 30 tablet, Rfl: 0   NEOMYCIN -POLYMYXIN-HYDROCORTISONE (CORTISPORIN) 1 % SOLN OTIC solution, Place 3 drops into the left ear 4 (four) times daily., Disp: 10 mL, Rfl: 0   omeprazole  (PRILOSEC) 20 MG capsule, TAKE 1 CAPSULE(20 MG) BY MOUTH TWICE DAILY BEFORE A MEAL, Disp: 180 capsule, Rfl: 0   polyethylene glycol (MIRALAX  / GLYCOLAX ) 17 g packet, Take 17 g by mouth daily., Disp: 14 each, Rfl: 0   Rimegepant Sulfate (NURTEC) 75 MG TBDP, Take 1 tablet (75 mg total) by mouth daily as needed. TAKE 1 TABLET AS NEEDED AT THE EARLIEST ON SET OF A MIGRAINE MAX OF 1 IN 24 HOURS, Disp: 8 tablet, Rfl: 5   risperiDONE  (RISPERDAL ) 1 MG tablet, TAKE 1 TABLET(1 MG) BY MOUTH AT BEDTIME, Disp: 90 tablet, Rfl: 0   rosuvastatin  (CRESTOR ) 20 MG tablet, Take 1 tablet (20 mg total) by mouth daily., Disp: 90 tablet, Rfl: 3   thiamine  (VITAMIN B1) 100 MG tablet, Take 1 tablet (100 mg total) by mouth daily., Disp: 90 tablet, Rfl: 1   topiramate  (TOPAMAX ) 100 MG tablet, TAKE 1 TABLET(100 MG) BY MOUTH TWICE DAILY, Disp: 60 tablet, Rfl: 11   traMADol  (ULTRAM ) 50 MG tablet, Take 1 tablet (50 mg total) by mouth every 12 (twelve) hours as needed., Disp: 180 tablet, Rfl: 0   traZODone  (DESYREL ) 100 MG tablet, TAKE 2 TABLETS(200 MG) BY MOUTH AT BEDTIME, Disp: 60 tablet, Rfl: 1   valACYclovir  (VALTREX ) 500 MG tablet, TAKE 1 TABLET(500 MG) BY MOUTH DAILY (Patient taking differently: Take 500 mg by mouth as needed.), Disp: 90 tablet, Rfl: 0   Zoster Vaccine Adjuvanted (SHINGRIX ) injection, Inject 0.5 mLs into the muscle once for 1 dose., Disp: 0.5 mL, Rfl: 1  "

## 2024-10-06 ENCOUNTER — Ambulatory Visit (INDEPENDENT_AMBULATORY_CARE_PROVIDER_SITE_OTHER): Admitting: Sports Medicine

## 2024-10-06 ENCOUNTER — Ambulatory Visit: Admitting: Internal Medicine

## 2024-10-06 VITALS — HR 77 | Ht 66.0 in | Wt 162.0 lb

## 2024-10-06 DIAGNOSIS — M1712 Unilateral primary osteoarthritis, left knee: Secondary | ICD-10-CM | POA: Diagnosis not present

## 2024-10-06 DIAGNOSIS — M25562 Pain in left knee: Secondary | ICD-10-CM

## 2024-10-06 DIAGNOSIS — G8929 Other chronic pain: Secondary | ICD-10-CM

## 2024-10-06 MED ORDER — MELOXICAM 15 MG PO TABS: 15.0000 mg | ORAL_TABLET | Freq: Every day | ORAL | 0 refills | Status: AC | PRN

## 2024-10-06 NOTE — Patient Instructions (Signed)
-   Use meloxicam  15 mg daily as needed for breakthrough pain.  Recommend limiting chronic NSAIDs to 1-2 doses per week to prevent long-term side effects. Use Tylenol  500 to 1000 mg tablets 2-3 times a day as needed for day-to-day pain relief.    Meloxicam  refill   Continue HEP   As needed follow up

## 2024-10-11 ENCOUNTER — Other Ambulatory Visit: Payer: Self-pay | Admitting: Internal Medicine

## 2024-10-11 DIAGNOSIS — M792 Neuralgia and neuritis, unspecified: Secondary | ICD-10-CM

## 2024-10-11 DIAGNOSIS — M19011 Primary osteoarthritis, right shoulder: Secondary | ICD-10-CM

## 2024-10-17 ENCOUNTER — Ambulatory Visit (INDEPENDENT_AMBULATORY_CARE_PROVIDER_SITE_OTHER)

## 2024-10-17 DIAGNOSIS — Z23 Encounter for immunization: Secondary | ICD-10-CM

## 2024-10-17 MED ORDER — CYANOCOBALAMIN 1000 MCG/ML IJ SOLN
1000.0000 ug | Freq: Once | INTRAMUSCULAR | Status: AC
  Administered 2024-10-17: 1000 ug via INTRAMUSCULAR

## 2024-10-17 NOTE — Progress Notes (Signed)
After obtaining consent, and per orders of Dr. Jones, injection of B12 was given by [place] Rylander P Lior Hoen. Patient instructed to report any adverse reaction to me immediately.  

## 2024-11-01 ENCOUNTER — Other Ambulatory Visit: Payer: Self-pay | Admitting: Internal Medicine

## 2024-11-01 DIAGNOSIS — F5104 Psychophysiologic insomnia: Secondary | ICD-10-CM

## 2024-11-04 ENCOUNTER — Encounter: Payer: Self-pay | Admitting: Podiatry

## 2024-11-04 ENCOUNTER — Ambulatory Visit: Admitting: Podiatry

## 2024-11-04 DIAGNOSIS — N183 Chronic kidney disease, stage 3 unspecified: Secondary | ICD-10-CM

## 2024-11-04 DIAGNOSIS — B351 Tinea unguium: Secondary | ICD-10-CM

## 2024-11-04 NOTE — Progress Notes (Signed)
This patient returns to my office for at risk foot care.  This patient requires this care by a professional since this patient will be at risk due to having chronic kidney disease  This patient is unable to cut nails himself since the patient cannot reach his nails.These nails are painful walking and wearing shoes.  This patient presents for at risk foot care today.  General Appearance  Alert, conversant and in no acute stress.  Vascular  Dorsalis pedis and posterior tibial  pulses are weakly  palpable  bilaterally.  Capillary return is within normal limits  bilaterally. Temperature is within normal limits  bilaterally.  Neurologic  Senn-Weinstein monofilament wire test within normal limits  bilaterally. Muscle power within normal limits bilaterally.  Nails Thick disfigured discolored nails with subungual debris  from hallux to fifth toes bilaterally. No evidence of bacterial infection or drainage bilaterally.  Orthopedic  No limitations of motion  feet .  No crepitus or effusions noted.  No bony pathology or digital deformities noted.  Skin  normotropic skin with no porokeratosis noted bilaterally.  No signs of infections or ulcers noted.     Onychomycosis  Pain in right toes  Pain in left toes  Consent was obtained for treatment procedures.   Mechanical debridement of nails 1-5  bilaterally performed with a nail nipper.  Filed with dremel without incident.    Return office visit   12  weeks                  Told patient to return for periodic foot care and evaluation due to potential at risk complications.   Helane Gunther DPM

## 2024-11-18 ENCOUNTER — Ambulatory Visit

## 2025-01-30 ENCOUNTER — Ambulatory Visit: Admitting: Neurology

## 2025-02-01 ENCOUNTER — Ambulatory Visit: Admitting: Podiatry
